# Patient Record
Sex: Female | Born: 1995 | Marital: Single | Smoking: Never smoker
Health system: Southern US, Community
[De-identification: ages and names within clinical notes are randomized; demographics above are authoritative.]

## PROBLEM LIST (undated history)

## (undated) DIAGNOSIS — G809 Cerebral palsy, unspecified: Secondary | ICD-10-CM

## (undated) DIAGNOSIS — F411 Generalized anxiety disorder: Secondary | ICD-10-CM

## (undated) DIAGNOSIS — N921 Excessive and frequent menstruation with irregular cycle: Secondary | ICD-10-CM

## (undated) DIAGNOSIS — G43909 Migraine, unspecified, not intractable, without status migrainosus: Secondary | ICD-10-CM

## (undated) HISTORY — PX: LEG SURGERY: SHX1003

## (undated) HISTORY — DX: Migraine, unspecified, not intractable, without status migrainosus: G43.909

## (undated) HISTORY — DX: Generalized anxiety disorder: F41.1

## (undated) HISTORY — DX: Cerebral palsy, unspecified: G80.9

## (undated) HISTORY — DX: Excessive and frequent menstruation with irregular cycle: N92.1

---
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
## 2013-01-19 ENCOUNTER — Encounter: Payer: Self-pay | Admitting: Pediatrics

## 2013-02-18 ENCOUNTER — Encounter: Payer: Self-pay | Admitting: Pediatrics

## 2013-03-21 ENCOUNTER — Encounter: Payer: Self-pay | Admitting: Pediatrics

## 2013-04-20 ENCOUNTER — Encounter: Payer: Self-pay | Admitting: Pediatrics

## 2013-05-21 ENCOUNTER — Encounter: Payer: Self-pay | Admitting: Pediatrics

## 2013-06-21 ENCOUNTER — Encounter: Payer: Self-pay | Admitting: Pediatrics

## 2013-07-21 ENCOUNTER — Encounter: Payer: Self-pay | Admitting: Pediatrics

## 2014-01-19 ENCOUNTER — Emergency Department: Payer: Self-pay | Admitting: Emergency Medicine

## 2014-01-19 LAB — COMPREHENSIVE METABOLIC PANEL
ALBUMIN: 4.3 g/dL (ref 3.8–5.6)
ALT: 11 U/L — AB (ref 12–78)
ANION GAP: 7 (ref 7–16)
Alkaline Phosphatase: 63 U/L
BUN: 18 mg/dL (ref 7–18)
Bilirubin,Total: 0.4 mg/dL (ref 0.2–1.0)
CALCIUM: 9.3 mg/dL (ref 9.0–10.7)
CHLORIDE: 106 mmol/L (ref 98–107)
CO2: 25 mmol/L (ref 21–32)
Creatinine: 0.66 mg/dL (ref 0.60–1.30)
EGFR (African American): >60
EGFR (Non-African Amer.): >60
GLUCOSE: 81 mg/dL (ref 65–99)
Osmolality: 277 (ref 275–301)
POTASSIUM: 3.7 mmol/L (ref 3.5–5.1)
SGOT(AST): 13 U/L (ref 0–26)
Sodium: 138 mmol/L (ref 136–145)
Total Protein: 7.9 g/dL (ref 6.4–8.6)

## 2014-01-19 LAB — URINALYSIS, COMPLETE
Bacteria: NONE SEEN
Bilirubin,UR: NEGATIVE
GLUCOSE, UR: NEGATIVE mg/dL (ref 0–75)
KETONE: NEGATIVE
Nitrite: NEGATIVE
PROTEIN: NEGATIVE
Ph: 5 (ref 4.5–8.0)
RBC,UR: 2 /HPF (ref 0–5)
Specific Gravity: 1.023 (ref 1.003–1.030)
Squamous Epithelial: 4

## 2014-01-19 LAB — DRUG SCREEN, URINE

## 2014-01-19 LAB — CBC
HCT: 39.5 % (ref 35.0–47.0)
HGB: 12.9 g/dL (ref 12.0–16.0)
MCH: 27.8 pg (ref 26.0–34.0)
MCHC: 32.6 g/dL (ref 32.0–36.0)
MCV: 85 fL (ref 80–100)
Platelet: 211 10*3/uL (ref 150–440)
RBC: 4.62 10*6/uL (ref 3.80–5.20)
RDW: 13.9 % (ref 11.5–14.5)
WBC: 7.1 10*3/uL (ref 3.6–11.0)

## 2014-01-19 LAB — SALICYLATE LEVEL: Salicylates, Serum: <1.7 mg/dL

## 2014-01-19 LAB — ETHANOL
Ethanol %: <0.003 % (ref 0.000–0.080)
Ethanol: <3 mg/dL

## 2014-01-19 LAB — ACETAMINOPHEN LEVEL: Acetaminophen: <2 ug/mL
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
## 2015-02-11 NOTE — Consult Note (Signed)
Brief Consult Note: Diagnosis: adjustment disorder with disturbance of mood and conduct. Mild MR.   Patient was seen by consultant.   Consult note dictated.   Recommend further assessment or treatment.   Discussed with Attending MD.   Comments: Psychiatry: Patient seen. Chart reviewed and note dictated. Patient with evident mild MR has been superficially cutting and acting out. Denies any wish to die. Familly feels safe with her coming back home. No indications for meds to start here. Refer to outpt counceling in the community. Patient agrees to the plan. DC the IVC.  Electronic Signatures: Audery Amellapacs, Ethelmae Ringel T (MD)  (Signed 01-Apr-15 23:16)  Authored: Brief Consult Note   Last Updated: 01-Apr-15 23:16 by Audery Amellapacs, Shanedra Lave T (MD)

## 2015-07-14 ENCOUNTER — Encounter: Payer: Self-pay | Admitting: Family Medicine

## 2015-07-14 ENCOUNTER — Ambulatory Visit (INDEPENDENT_AMBULATORY_CARE_PROVIDER_SITE_OTHER): Payer: Self-pay | Admitting: Family Medicine

## 2015-07-14 ENCOUNTER — Encounter (INDEPENDENT_AMBULATORY_CARE_PROVIDER_SITE_OTHER): Payer: Self-pay

## 2015-07-14 VITALS — BP 112/68 | HR 92 | Temp 97.8°F | Ht 62.4 in | Wt 89.8 lb

## 2015-07-14 DIAGNOSIS — G809 Cerebral palsy, unspecified: Secondary | ICD-10-CM

## 2015-07-14 DIAGNOSIS — R634 Abnormal weight loss: Secondary | ICD-10-CM

## 2015-07-14 DIAGNOSIS — F32A Depression, unspecified: Secondary | ICD-10-CM

## 2015-07-14 DIAGNOSIS — F329 Major depressive disorder, single episode, unspecified: Secondary | ICD-10-CM

## 2015-07-14 NOTE — Patient Instructions (Signed)
Nice to meet you. Please eat calorie dense foods. We will refer you to a psychologist and someone who specializes in cerebral palsy.  If you develops fever, thoughts of hurting her self or others, chest pain, shortness of breath, cough, or chills please seek medical attention.  Please consider obtaining blood work.

## 2015-07-14 NOTE — Progress Notes (Signed)
Patient ID: Noorah Giammona, female   DOB: [birth year], 19 y.o.   MRN: [id number]  Marikay Alar, MD Phone: [phone]  Burnett Lieber is a 19 y.o. female who presents today for new patient visit.  Patient was seen with the assistance of an urdu phone interpretor. History obtained from patient, patients mother, and patients brother.  Weight loss: reports weight loss of 20 lbs over the past 2 years. Notes she does not have much of an appetite for normal meals, though will eat lots of snacks. They note she feels depressed and will not eat much if she feels stressed. Has issues with depression intermittently. Denies SI and HI. Has not previously been on medications for this. Has never seen a therapist. Notes they moved to the [place] from [place] 5 years ago. No known TB exposure. Notes she intermittently feels hot and sweats, though no fevers. Notes some intermittent muscle spasms. No abdominal pain, vomiting, or diarrhea. No blood in stool. No cough. Is not sexually active. Had blood work in [place] prior to leaving. Has not seen a doctor in >2 years.   Has cerebral palsy. Diagnosed at age 84 in [place]. Has physical and mental disabilities from this. Is unable to read or write in english due to this. Graduated from high school. Notes she has not seen a CP specialist. Notes some contractures in LE with this. Difficulty walking on own due to contractures in legs.   Active Ambulatory Problems    Diagnosis Date Noted  . Loss of weight 07/17/2015  . Depression 07/17/2015  . Cerebral palsy 07/17/2015   Resolved Ambulatory Problems    Diagnosis Date Noted  . No Resolved Ambulatory Problems   No Additional Past Medical History    Family History  Problem Relation Age of Onset  . Stroke      grandparent  . Hypertension      parent  . Mental illness      parent  . Diabetes      parent, grandparent    Social History   Social History  . Marital Status: Single    Spouse Name: N/A  . Number  of Children: N/A  . Years of Education: N/A   Occupational History  . Not on file.   Social History Main Topics  . Smoking status: Never Smoker   . Smokeless tobacco: Not on file  . Alcohol Use: No  . Drug Use: No  . Sexual Activity: Not on file   Other Topics Concern  . Not on file   Social History Narrative    ROS   General:  Positive for unexplained weight loss, negative for fever Skin: Negative for new or changing mole, sore that won't heal HEENT: Negative for trouble hearing, trouble seeing, ringing in ears, mouth sores, hoarseness, change in voice, dysphagia. CV:  Negative for chest pain, dyspnea, edema, palpitations Resp: Negative for cough, dyspnea, hemoptysis GI: Negative for nausea, vomiting, diarrhea, constipation, abdominal pain, melena, hematochezia. GU: Negative for dysuria, incontinence, urinary hesitance, hematuria, vaginal or penile discharge, polyuria, sexual difficulty, lumps in testicle or breasts MSK: Negative for muscle cramps or aches, joint pain or swelling Neuro: Negative for headaches, weakness, numbness, dizziness, passing out/fainting Psych: Positive for stress, Negative for depression, anxiety, memory problems  Objective  Physical Exam Filed Vitals:   07/14/15 1255  BP: 112/68  Pulse: 92  Temp: 97.8 F (36.6 C)    Physical Exam  Constitutional:  Thin female  HENT:  Head: Normocephalic and atraumatic.  Right Ear:  External ear normal.  Left Ear: External ear normal.  Mouth/Throat: Oropharynx is clear and moist. No oropharyngeal exudate.  Eyes: Conjunctivae are normal. Pupils are equal, round, and reactive to light.  Neck: Neck supple. No thyromegaly present.  Cardiovascular: Normal rate, regular rhythm and normal heart sounds.  Exam reveals no gallop and no friction rub.   No murmur heard. Pulmonary/Chest: Effort normal and breath sounds normal. No respiratory distress. She has no wheezes. She has no rales.  Abdominal: Soft. Bowel  sounds are normal. She exhibits no distension. There is no tenderness. There is no rebound and no guarding.  Musculoskeletal: She exhibits no edema.  Hamstring contractures present  Lymphadenopathy:    She has no cervical adenopathy.  Neurological: She is alert.  Gait with limp related to contractures  Skin: Skin is warm and dry.  No supraclavicular, axillary, or inguinal adenopathy      Assessment/Plan:   Loss of weight Patient with apparent unexplained loss of weight over the past 2 years. Suspect this is due to depression given patient report of depression, though could also be related to her cerebral palsy. Does have intermittent sweats that could represent a number of things. No other apparent causes on physical exam. Discussed at length that further lab work would be beneficial to evaluate for other cause, though patient and family members declined this at this time, stating they would consider it in the future. Discussed high calorie intake. Will refer to psychology for eval of depression. Will continue to monitor patients weight. Given return precautions.   Depression Per patient report has intermittent stress and depression. No SI or HI. Not previously treated for this. Will refer to psychology for further evaluation of this issue. Given return precautions.   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 Will refer to [facility] PM[facility] for further evaluation and treatment of spasticity.     Orders Placed This Encounter  Procedures  . Ambulatory referral to Physical Medicine Rehab    Referral Priority:  Routine    Referral Type:  Rehabilitation    Referral Reason:  Specialty Services Required    Requested Specialty:  Physical Medicine and Rehabilitation    Number of Visits Requested:  1  . Ambulatory referral to Psychology    Referral Priority:  Routine    Referral Type:  Psychiatric    Referral Reason:  Specialty Services Required    Requested Specialty:  Psychology     Number of Visits Requested:  1    Marikay Alar

## 2015-07-17 ENCOUNTER — Encounter: Payer: Self-pay | Admitting: Family Medicine

## 2015-07-17 DIAGNOSIS — F32A Depression, unspecified: Secondary | ICD-10-CM | POA: Insufficient documentation

## 2015-07-17 DIAGNOSIS — G809 Cerebral palsy, unspecified: Secondary | ICD-10-CM | POA: Insufficient documentation

## 2015-07-17 DIAGNOSIS — R634 Abnormal weight loss: Secondary | ICD-10-CM | POA: Insufficient documentation

## 2015-07-17 DIAGNOSIS — F329 Major depressive disorder, single episode, unspecified: Secondary | ICD-10-CM | POA: Insufficient documentation

## 2015-07-17 NOTE — Assessment & Plan Note (Signed)
Patient with spasticity noted in LE. Will refer to [facility] PM&R for further evaluation and treatment of spasticity.

## 2015-07-17 NOTE — Assessment & Plan Note (Signed)
Per patient report has intermittent stress and depression. No SI or HI. Not previously treated for this. Will refer to psychology for further evaluation of this issue. Given return precautions.

## 2015-07-17 NOTE — Assessment & Plan Note (Addendum)
Patient with apparent unexplained loss of weight over the past 2 years. Suspect this is due to depression given patient report of depression, though could also be related to her cerebral palsy. Does have intermittent sweats that could represent a number of things. No other apparent causes on physical exam. Discussed at length that further lab work would be beneficial to evaluate for other cause, though patient and family members declined this at this time, stating they would consider it in the future. Discussed high calorie intake. Will refer to psychology for eval of depression. Will continue to monitor patients weight. Given return precautions.

## 2015-07-18 ENCOUNTER — Telehealth: Payer: Self-pay | Admitting: Family Medicine

## 2015-07-18 NOTE — Telephone Encounter (Signed)
Pt brother dropped off pt medical records from previous doctor in [place]. Medical records is in Dr Birdie Sons box. Thank You!

## 2015-07-18 NOTE — Telephone Encounter (Signed)
Called brother and LM that I have copied patients medical records and they are up front for him to pick up

## 2015-07-21 ENCOUNTER — Telehealth: Payer: Self-pay

## 2015-07-21 NOTE — Telephone Encounter (Signed)
Patient called wanting a follow up on paperwork that they dropped off? Would like a call back.

## 2016-10-02 ENCOUNTER — Ambulatory Visit: Payer: Medicaid Other | Attending: Internal Medicine | Admitting: Physical Therapy

## 2016-10-02 ENCOUNTER — Encounter: Payer: Self-pay | Admitting: Physical Therapy

## 2016-10-02 DIAGNOSIS — M6281 Muscle weakness (generalized): Secondary | ICD-10-CM | POA: Diagnosis not present

## 2016-10-02 DIAGNOSIS — R2681 Unsteadiness on feet: Secondary | ICD-10-CM | POA: Diagnosis present

## 2016-10-02 NOTE — Therapy (Signed)
[facility] [address] [place], [place], [address] Phone: [phone]   Fax:  [phone]  Physical Therapy Evaluation  Patient Details  Name: Renee BaldFatima Walton MRN: [id number] Date of Birth: 10-Jan-1996 Referring Provider: Felecia JanNeelham Khan MD  Encounter Date: 10/02/2016      PT End of Session - 10/02/16 1253    Visit Number 1   Number of Visits 17   Date for PT Re-Evaluation 11/27/16   Authorization Type medicaid   PT Start Time 1125   PT Stop Time 1200   PT Time Calculation (min) 35 min   Activity Tolerance Patient tolerated treatment well;No increased pain   Behavior During Therapy WFL for tasks assessed/performed      Past Medical History:  Diagnosis Date  . Cerebral palsy [facility](HCC)     Past Surgical History:  Procedure Laterality Date  . LEG SURGERY     bilateral x2 in 2008    There were no vitals filed for this visit.       Subjective Assessment - 10/02/16 1122    Subjective 20 yo Female diagnosed with cerebral palsy reports increased difficulty with functional mobility; Patient reports stopping PT about 4 years ago; She reports increased tightness in BLE especiallly in hips; She reports not being able to get up after sitting on the floor; She has trouble getting up from low chair; She currently uses a reverse kaye walker for long distance walking; She reports using HHA while at home or outside. She reports multiple falls; reports occasional numbness/tingling in LLE; She reports increased hip adductor tone especially with walking and talking;    Patient is accompained by: Family member   Pertinent History pertinent factors affecting rehab: cerebral palsy, high fall risk; good family support;    Limitations Standing;Walking   How long can you stand comfortably? 5-15 min   How long can you walk comfortably? 30 min;    Diagnostic tests none recent;    Patient Stated Goals be more independent, walk without person  assist,    Currently in Pain? No/denies            [facility] - 10/02/16 0001      Assessment   Medical Diagnosis Cerebral Palsy   Referring Provider Felecia JanNeelham Khan MD   Hand Dominance Left   Next MD Visit Feb 2018   Prior Therapy had PT about 4 years ago, however had to stop because of loss of insurance;      Precautions   Precautions Fall   Required Braces or Orthoses --  none- did wear AFOs but they are too small now     Restrictions   Weight Bearing Restrictions No     Balance Screen   Has the patient fallen in the past 6 months Yes   How many times? multiple  2+ per day   Has the patient had a decrease in activity level because of a fear of falling?  Yes   Is the patient reluctant to leave their home because of a fear of falling?  No     Home Environment   Additional Comments Lives with family; level entry home, lives in 2 story home with bedroom/bathroom on 2nd floor; has 10 steps to get to 2nd floor, left rail, negotiates one step at a time; needs help with meal prep, mom helps with bathing/dressing; she helps with getting on/off shoes,      Prior Function   Level of Independence Requires assistive  device for independence;Needs assistance with ADLs;Needs assistance with gait   Vocation Student  [employer]   Vocation Requirements uses walker at school; able to get in/out of desk okay   Leisure play on ipad;      Cognition   Overall Cognitive Status Within Functional Limits for tasks assessed     Observation/Other Assessments   Observations very pleasant young woman, demonstrates increased tone with talking and with mobility;     Sensation   Light Touch Appears Intact     Coordination   Heel Shin Test impaired BLE     Posture/Postural Control   Posture Comments able to sit with erect posture, demonstrates increased knee extension with trunk movement due to extensor tone;      AROM   Overall AROM Comments BUE is WFL; BLE PROM is WFL, AROM is  limited due to tone and weakness;      Strength   Overall Strength Comments BUE grossly 4/5   Right Hip Flexion 3-/5   Right Hip Extension 3/5   Right Hip ABduction 2-/5   Right Hip ADduction 2+/5   Left Hip Flexion 3/5   Left Hip Extension 3/5   Left Hip ABduction 2-/5   Left Hip ADduction 2+/5   Right Knee Flexion 4-/5   Right Knee Extension 3-/5   Left Knee Flexion 4-/5   Left Knee Extension 2+/5   Right Ankle Dorsiflexion 2-/5   Left Ankle Dorsiflexion 2/5     Palpation   Palpation comment denies any tenderness to palpation;      FABER test   findings Positive   Comment positive bilaterally;     Straight Leg Raise   Comment negative bilaterally;     [facility](FABER) Test   Comments positive bilaterally;     Hip Scouring   Comments negative bilaterally;     Bed Mobility   Bed Mobility --  patient requires min A for bed mobility;      Transfers   Comments able to transfer independently requiring arm rests to push up on;      Ambulation/Gait   Gait Comments ambulates with RW with decreased step length, increased RLE toe out, flat foot at initial contact with foot drag, lateral trunk sway, poor hip and knee mobility/control, narrow base of support;      6 minute walk test results    Endurance additional comments fair     Standardized Balance Assessment   10 Meter Walk 0.344 m/s with RW (high fall risk, limited home ambulator)     High Level Balance   High Level Balance Comments able to stand without AD with feet apart, needs assistance to avoid loss of balance when taking a step;        Patient reports increased sacral pain with standing and walking; She did test positive with FABERs. However with other hip movement, patient reports mostly stretch and not pain; She demonstrates increased tone: R: 3+, L: 3+ on modified ashworth;                    PT Education - 10/02/16 1253    Education provided Yes   Education Details recommendations    Person(s) Educated Patient   Methods Explanation   Comprehension Verbalized understanding             PT Long Term Goals - 10/02/16 1300      PT LONG TERM GOAL #1   Title Patient will be independent in home exercise  program to improve strength/mobility for better functional independence with ADLs.   Baseline does not have a HEP   Time 8   Period Weeks   Status New     PT LONG TERM GOAL #2   Title Patient (< 41 years old) will complete five times sit to stand test in < 10 seconds indicating an increased LE strength and improved balance.   Baseline unable to assess; does require BUE to push up on arm rests;    Time 8   Period Weeks   Status New     PT LONG TERM GOAL #3   Title Patient will increase 10 meter walk test to >0.8 m/s as to improve gait speed for better community ambulation and to reduce fall risk.   Baseline 0.34 m/s   Time 8   Period Weeks   Status New     PT LONG TERM GOAL #4   Title Patient will increase lower extremity functional scale to >40/80 to demonstrate improved functional mobility and increased tolerance with ADLs.    Baseline unable to assess at this time;    Time 8   Period Weeks   Status New               Plan - 10/02/16 1253    Clinical Impression Statement 20 yo Female diagnosed with cerebral palsy, presents to therapy with increased weakness and decreased mobility. Patient reports walking with AD and requiring assistance from family for most ADLs. She reports out growing her AFOs and therefore has not been wearing any braces on her LE. Patient demonstrates increased BLE extensor tone. She reports that she has not been stretching regularly. She reports multiple falls each day. She reports that she has not been following up with MD or physical therapy due to loss of insurance coverage. Patient would benefit from skilled PT intervention to improve LE strength, balance and gait safety; She would also benefit from evaluation for possible  orthotics to improve gait safety;    Rehab Potential Fair   Clinical Impairments Affecting Rehab Potential positive: Family support, negative: impaired balance, weakness, progress condition; Patient's clinical presentation is evolving due to fall risk;    PT Frequency 2x / week   PT Duration 8 weeks   PT Treatment/Interventions ADLs/Self Care Home Management;Cryotherapy;Electrical Stimulation;Gait training;Moist Heat;Stair training;Functional mobility training;Therapeutic activities;Therapeutic exercise;Balance training;Neuromuscular re-education;Manual techniques;Patient/family education;Passive range of motion;Dry needling;Energy conservation   PT Next Visit Plan initiate HEP   PT Home Exercise Plan will address next visit;    Consulted and Agree with Plan of Care Patient      Patient will benefit from skilled therapeutic intervention in order to improve the following deficits and impairments:  Abnormal gait, Pain, Postural dysfunction, Decreased mobility, Decreased activity tolerance, Decreased endurance, Decreased range of motion, Decreased strength, Impaired flexibility, Difficulty walking, Decreased safety awareness, Decreased balance  Visit Diagnosis: Muscle weakness (generalized) - Plan: PT plan of care cert/re-cert  Unsteadiness on feet - Plan: PT plan of care cert/re-cert     Problem List Patient Active Problem List   Diagnosis Date Noted  . Loss of weight 07/17/2015  . Depression 07/17/2015  . Cerebral palsy (HCC) 07/17/2015    Trotter,Margaret PT, DPT 10/02/2016, 1:03 PM  [facility] [facility] MAIN [facility] SERVICES [address] [place], [place], [address] Phone: [phone]   Fax:  [phone]  Name: Renee Walton MRN: [id number] Date of Birth: April 17, 1996

## 2016-10-11 ENCOUNTER — Encounter: Payer: Medicaid Other | Admitting: Occupational Therapy

## 2016-10-11 ENCOUNTER — Ambulatory Visit: Payer: Medicaid Other | Admitting: Speech Pathology

## 2016-10-24 ENCOUNTER — Ambulatory Visit: Payer: Medicaid Other | Attending: Internal Medicine | Admitting: Speech Pathology

## 2016-10-24 DIAGNOSIS — R41841 Cognitive communication deficit: Secondary | ICD-10-CM | POA: Insufficient documentation

## 2016-10-24 DIAGNOSIS — R471 Dysarthria and anarthria: Secondary | ICD-10-CM | POA: Diagnosis not present

## 2016-10-24 DIAGNOSIS — M6281 Muscle weakness (generalized): Secondary | ICD-10-CM | POA: Diagnosis present

## 2016-10-24 DIAGNOSIS — R2681 Unsteadiness on feet: Secondary | ICD-10-CM | POA: Diagnosis present

## 2016-10-25 ENCOUNTER — Encounter: Payer: Self-pay | Admitting: Speech Pathology

## 2016-10-25 NOTE — Therapy (Signed)
[facility] [address] [place], [place], [address] Phone: [phone]   Fax:  [phone]  Speech Language Pathology Evaluation  Patient Details  Name: Renee BaldFatima Walton MRN: [id number] Date of Birth: 12-17-1995 Referring Provider: Margaretann LovelessKHAN, NEELAM S  Encounter Date: 10/24/2016      End of Session - 10/25/16 1030    Visit Number 1   Number of Visits 5   Date for SLP Re-Evaluation 11/29/16   SLP Start Time 1400   SLP Stop Time  1500   SLP Time Calculation (min) 60 min   Activity Tolerance Patient tolerated treatment well      Past Medical History:  Diagnosis Date  . Cerebral palsy [facility](HCC)     Past Surgical History:  Procedure Laterality Date  . LEG SURGERY     bilateral x2 in 2008    There were no vitals filed for this visit.          SLP Evaluation [facility] - 10/25/16 0001      SLP Visit Information   SLP Received On 10/24/16   Referring Provider Yves DillKHAN, NEELAM S   Onset Date 08/31/2016     Subjective   Subjective The patient reports multiple communication problems including: she talks too fast, she is too loud ("I don't know what my inside voice sounds like"), difficulty reading and pronouncing words she reads.  She reports multiple pragmatic issues including, poor eye contact, delayed expression of social details, poor response to feedback, and inattention to others.   Patient/Family Stated Goal The patient has unrealistic expectations of speech therapy, her family do not state goals for the patient.     Pain Assessment   Currently in Pain? No/denies     General Information   HPI The patient is a 21 year old with cerebral palsy.  She has had extensive speech therapy in the pediatric setting.  Patient reports that she is currently enrolled in a life living school at [employer].     Prior Functional Status   Cognitive/Linguistic Baseline Baseline deficits   Baseline deficit details Mental  disabilities due to cerebral palsy     Cognition   Overall Cognitive Status History of cognitive impairments - at baseline     Auditory Comprehension   Overall Auditory Comprehension Appears within functional limits for tasks assessed     Reading Comprehension   Reading Status Impaired  Impairments due to intellectual ability     Expression   Primary Mode of Expression Verbal     Verbal Expression   Overall Verbal Expression Appears within functional limits for tasks assessed   Pragmatics Impairment     Written Expression   Written Expression Exceptions to [facility]   Overall Writen Expression Impairment due to intellectual disability     Oral Motor/Sensory Function   Overall Oral Motor/Sensory Function Impaired at baseline   Overall Oral Motor/Sensory Function Cerebral palsy     Motor Speech   Overall Motor Speech Impaired at baseline  Cerebral palsy            SLP Education - 10/25/16 1029    Education provided Yes   Education Details Recommendations   Person(s) Educated Patient;Parent(s);Other (comment)   Methods Explanation   Comprehension Verbalized understanding            SLP Long Term Goals - 10/25/16 1034      SLP LONG TERM GOAL #1   Title Patient will verbalize speech intelligiblity strategies and generate approaches  to generalize into unstructured settings   Baseline Patient can state speech intelligiblity strategies but does not indepedently generalize across settings.   Time 4   Period Weeks   Status New     SLP LONG TERM GOAL #2   Title Patient will develop, in conjuction with SLP, system to structure her daily life.    Baseline Patient does not independently engae in stimulating activities outside of structured settings   Time 4   Period Weeks   Status New          Plan - 10/25/16 1032    Clinical Impression Statement The patient is presenting with mild-moderate dysarthria typical of cerebral palsy.  She has had extensive speech therapy  in the pediatric setting and is able to use appropriate strategies to improve her speech intelligibility in a structured setting such as this evaluation session.  The patient is able to state and utilize speech strategies learned in speech therapy but is having difficulty generalizing to other settings.  The patient will benefit from skilled speech therapy to develop, in conjunction with the patient, methods to generalize speech intelligibility strategies into less structured settings.  The patient is reporting a reduction in her independent completion of activities when not in structured settings.  She will benefit from skilled speech therapy to develop, in conjunction with the patient, a system to structure her daily life to encourage engagement in stimulating activities.  In addition, the patient reports that her speech symptoms worsen when she is anxious.  She is not able to tell me behavioral strategies that help her to calm independently.  The patient would benefit from skilled counseling to work with her to develop practical self-calming strategies.     Speech Therapy Frequency 1x /week   Duration 4 weeks   Treatment/Interventions Compensatory strategies;SLP instruction and feedback;Environmental controls;Internal/external aids;Patient/family education   Potential to Achieve Goals Good   Potential Considerations Ability to learn/carryover information;Co-morbidities;Cooperation/participation level;Medical prognosis;Previous level of function;Severity of impairments;Family/community support   SLP Home Exercise Plan To be determined   Consulted and Agree with Plan of Care Patient;Family member/caregiver   Family Member Consulted Mother and brother      Patient will benefit from skilled therapeutic intervention in order to improve the following deficits and impairments:   Dysarthria and anarthria - Plan: SLP plan of care cert/re-cert  Cognitive communication deficit - Plan: SLP plan of care  cert/re-cert    Problem List Patient Active Problem List   Diagnosis Date Noted  . Loss of weight 07/17/2015  . Depression 07/17/2015  . Cerebral palsy (HCC) 07/17/2015   Dollene Primrose, MS/CCC- SLP  Leandrew Koyanagi 10/25/2016, 10:43 AM  [facility] MAIN [facility] SERVICES [address] [place], [place], [address] Phone: [phone]   Fax:  [phone]  Name: Renee Walton MRN: [id number] Date of Birth: May 28, 1996

## 2016-11-20 ENCOUNTER — Encounter: Payer: Self-pay | Admitting: Physical Therapy

## 2016-11-20 ENCOUNTER — Ambulatory Visit: Payer: Medicaid Other | Admitting: Physical Therapy

## 2016-11-20 DIAGNOSIS — R2681 Unsteadiness on feet: Secondary | ICD-10-CM

## 2016-11-20 DIAGNOSIS — R471 Dysarthria and anarthria: Secondary | ICD-10-CM | POA: Diagnosis not present

## 2016-11-20 DIAGNOSIS — M6281 Muscle weakness (generalized): Secondary | ICD-10-CM

## 2016-11-20 NOTE — Therapy (Signed)
[facility] [address] [place], [place], [address] Phone: [phone]   Fax:  [phone]  Physical Therapy Treatment  Patient Details  Name: Renee Walton MRN: [id number] Date of Birth: Jun 23, 1996 Referring Provider: Felecia JanNeelham Khan MD  Encounter Date: 11/20/2016      PT End of Session - 11/20/16 0855    Visit Number 2   Number of Visits 17   Date for PT Re-Evaluation 11/27/16   Authorization Type medicaid (3 visits approved 1/2-2/26/18); visit1   PT Start Time 0850   PT Stop Time 0930   PT Time Calculation (min) 40 min   Equipment Utilized During Treatment Gait belt   Activity Tolerance Patient tolerated treatment well;No increased pain   Behavior During Therapy WFL for tasks assessed/performed      Past Medical History:  Diagnosis Date  . Cerebral palsy [facility](HCC)     Past Surgical History:  Procedure Laterality Date  . LEG SURGERY     bilateral x2 in 2008    There were no vitals filed for this visit.      Subjective Assessment - 11/20/16 0854    Subjective Patient reports doing okay; She presents to therapy without walker. She reports, "I forgot my walker." Patient also presents without AFOs. She reports having 3 falls in last few weeks;    Patient is accompained by: Family member   Pertinent History pertinent factors affecting rehab: cerebral palsy, high fall risk; good family support;    Limitations Standing;Walking   How long can you stand comfortably? 5-15 min   How long can you walk comfortably? 30 min;    Diagnostic tests none recent;    Patient Stated Goals be more independent, walk without person assist,    Currently in Pain? No/denies          TREATMENT:  Warm up on Nustep BUE/BLE level 2 x10 min with HEP instruction;  Initiated HEP: Standing: Hip abduction x10 without yellow band with cues to avoid hip/trunk lean for better strengthening; Attempted hip extension but patient unable to  do at this time; Side stepping yellow tband x10 feet x2 laps each direction with cues to avoid trunk lean and use bar for better balance;  Forward/backward walking, unsupported with CGA- min A for balance and cues to increase step length with backwards walking for better balance;  Forward/backward step x5 reps with cues to increase step length for increased balance challenge; Side step over line x5 with rail assist with cues for less trunk lean and to increase step length;  Educated patient in LE stretches: Seated hamstring stretch 15 sec hold x1 bilaterally Hip adductor stretch 15 sec hold x1 bilaterally;  Supine: KNee to chest stretch 15 sec hold x1 bilaterally; Lumbar trunk rotation x10 bilaterally; Prone: Press up on elbows x5 reps; Quad stretch 15 sec hold x2 bilaterally;  Patient required min-moderate verbal/tactile cues for correct exercise technique including cues for correct position and hold time for better stretch and tissue flexibility;  Standing heel off step stretch with cues to keep knee straight for better calf stretch 10 sec hold x1;  Patient instructed to bring walker to next session to assess safety and correct position of AD to reduce fall risk;                        PT Education - 11/20/16 0855    Education provided Yes   Education Details HEP initiated, strengthening/balance exercise;  Person(s) Educated Patient   Methods Explanation;Verbal cues;Handout   Comprehension Verbalized understanding;Returned demonstration;Verbal cues required             PT Long Term Goals - 10/02/16 1300      PT LONG TERM GOAL #1   Title Patient will be independent in home exercise program to improve strength/mobility for better functional independence with ADLs.   Baseline does not have a HEP   Time 8   Period Weeks   Status New     PT LONG TERM GOAL #2   Title Patient (< 42 years old) will complete five times sit to stand test in < 10 seconds  indicating an increased LE strength and improved balance.   Baseline unable to assess; does require BUE to push up on arm rests;    Time 8   Period Weeks   Status New     PT LONG TERM GOAL #3   Title Patient will increase 10 meter walk test to >0.8 m/s as to improve gait speed for better community ambulation and to reduce fall risk.   Baseline 0.34 m/s   Time 8   Period Weeks   Status New     PT LONG TERM GOAL #4   Title Patient will increase lower extremity functional scale to >40/80 to demonstrate improved functional mobility and increased tolerance with ADLs.    Baseline unable to assess at this time;    Time 8   Period Weeks   Status New               Plan - 11/20/16 1610    Clinical Impression Statement Patient instructed in LE stretches/strengthening exercise as part of HEP; She required mod Vcs for positioning and posture to improve strengthening; Patient also required mod VCs to improve exercise technique to improve muscle contraction. Patient fatigues quickly and demonstrates increased lateral trunk lean for compensation with hip weakness. Also educated patient in balance exercise, She required CGA to min A for advanced balance exercise. Patient would benefit from additional skilled PT intervention to improve strength, balance and tissue flexibility for improved independence in ADLs;    Rehab Potential Fair   Clinical Impairments Affecting Rehab Potential positive: Family support, negative: impaired balance, weakness, progress condition; Patient's clinical presentation is evolving due to fall risk;    PT Frequency 2x / week   PT Duration 8 weeks   PT Treatment/Interventions ADLs/Self Care Home Management;Cryotherapy;Electrical Stimulation;Gait training;Moist Heat;Stair training;Functional mobility training;Therapeutic activities;Therapeutic exercise;Balance training;Neuromuscular re-education;Manual techniques;Patient/family education;Passive range of motion;Dry  needling;Energy conservation   PT Next Visit Plan initiate HEP   PT Home Exercise Plan will address next visit;    Consulted and Agree with Plan of Care Patient      Patient will benefit from skilled therapeutic intervention in order to improve the following deficits and impairments:  Abnormal gait, Pain, Postural dysfunction, Decreased mobility, Decreased activity tolerance, Decreased endurance, Decreased range of motion, Decreased strength, Impaired flexibility, Difficulty walking, Decreased safety awareness, Decreased balance  Visit Diagnosis: Muscle weakness (generalized)  Unsteadiness on feet     Problem List Patient Active Problem List   Diagnosis Date Noted  . Loss of weight 07/17/2015  . Depression 07/17/2015  . Cerebral palsy (HCC) 07/17/2015    Trotter,Margaret PT, DPT 11/20/2016, 9:26 AM  [place] [facility] MAIN [facility] SERVICES [address] [place], [place], [address] Phone: [phone]   Fax:  [phone]  Name: Renee Walton MRN: [id number] Date of Birth: 20-May-1996

## 2016-11-20 NOTE — Patient Instructions (Addendum)
Balance, Proprioception: Hip Abduction With Tubing   With tubing attached to both ankles, Standing holding onto counter, kick one leg out to side and then Return.  Repeat _10___ times  On each side.  Do ___2_ sessions per day.  http://cc.exer.us/20     Copyright  VHI. All rights reserved.  Balance, Proprioception: Hip Extension With Tubing   With tubing tied around both legs, holding onto kitchen counter, swing leg back. Return. Repeat _10___ times . Do __2__ sessions per day.  http://cc.exer.us/19     Copyright  VHI. All rights reserved.  Band Walk: Side Stepping   Tie band around legs,Around ankles. Step _10__ feet to one side, then step back to start. Repeat _2-3__ feet per session. Note: Small towel between band and skin eases rubbing.  http://plyo.exer.us/76    Toe / Heel Raise (Standing)    Standing with support, raise heels, then rock back on heels and raise toes. Repeat __10__ times.  Copyright  VHI. All rights reserved.   SIT TO STAND: No Device   Sit with feet shoulder-width apart, on floor.(Make sure that you are in a chair that won't move like a chair against a wall or couch etc) Lean chest forward, raise hips up from surface. Straighten hips and knees. Weight bear equally on left and right sides. 10___ reps per set, _2__ sets per day, _5__ days per week Place left leg closer to sitting surface.  Copyright  VHI. All rights reserved.  Backward Walking   Walk backward, toes of each foot coming down first. Take long, even strides. Make sure you have a clear pathway with no obstructions when you do this. Stand beside counter and walk backward  And then walk forward doing opposite directions; repeat 10 laps 2x a day at least 5 days a week.   Four Square Stepping   Stand in right front square of four square pattern. Move clockwise, stepping from one square to next. Keep facing forward. Repeat __10__ times per session. Do _1___ sessions per day.     Copyright  VHI. All rights reserved. Knee to Chest (Flexion)   Pull knee toward chest. Feel stretch in lower back or buttock area. Breathing deeply, Hold __15__ seconds. Repeat with other knee. Repeat _2-3___ times. Do _2-3___ sessions per day.  http://[email or web address].exer.us/225   Copyright  VHI. All rights reserved.   Lower Trunk Rotation Stretch  Lying on back with knees bent, Keeping back flat and feet together, rotate knees side to side slowly and in pain free range of motion.  Hold _2___ seconds. Repeat for 1-2 minutes. Do __1__ sets per session. Do __2-3__ sessions per day.  http://orth.exer.us/122   Hamstring Stretch    Reach down along right leg until a comfortable stretch is felt in back of thigh. Be sure to keep knee straight. Hold __15__ seconds. Repeat __2__ times per set. Do _2___ sets per session. Do __2__ sessions per day.  http://orth.exer.us/157   Copyright  VHI. All rights reserved.  PELVIC PRESSURE: Bound Angle    Sit with soles of feet together. Hold feet or ankles. Press knees toward floor and stretch up through spine. Lean forward and Hold position for _10__sec.  Repeat _2-3__ times. Do __2_ times per day.  Copyright  VHI. All rights reserved.    Copyright  VHI. All rights reserved.  Back Hyperextension: Using Arms    Lying face down with arms bent, inhale. Then while exhaling, straighten arms. Hold ___5_ seconds. Slowly return to starting position. Repeat __10__ times per  set. Do __1__ sets per session. Do _2___ sessions per day.  Copyright  VHI. All rights reserved.  KNEE: Quadriceps - Prone    Place strap around ankle. Bring ankle toward buttocks. Press hip into surface. Hold _15__ seconds. _2__ reps per set, __2_ sets per day, __5_ days per week    Copyright  VHI. All rights reserved.  ANKLE: Dorsiflexion, Step Unilateral   Stand on step, hang one heel off back of step. Hold _20__ seconds. __3_ reps per set, __2_ sets per day, _5_  days per week Hold onto a support.  Copyright  VHI. All rights reserved.

## 2016-12-04 ENCOUNTER — Ambulatory Visit: Payer: Medicaid Other | Attending: Internal Medicine | Admitting: Physical Therapy

## 2016-12-04 ENCOUNTER — Encounter: Payer: Self-pay | Admitting: Physical Therapy

## 2016-12-04 DIAGNOSIS — R2681 Unsteadiness on feet: Secondary | ICD-10-CM

## 2016-12-04 DIAGNOSIS — M6281 Muscle weakness (generalized): Secondary | ICD-10-CM

## 2016-12-04 NOTE — Therapy (Signed)
[facility] [address] [place], [place], [address] Phone: [phone]   Fax:  [phone]  Physical Therapy Treatment  Patient Details  Name: Renee Walton MRN: [id number] Date of Birth: 1996-10-20 Referring Provider: Felecia JanNeelham Khan MD  Encounter Date: 12/04/2016      PT End of Session - 12/04/16 1654    Visit Number 3   Number of Visits 17   Date for PT Re-Evaluation 01/01/17   Authorization Type medicaid (3 visits approved 1/2-2/26/18); visit1   PT Start Time 1645   PT Stop Time 1730   PT Time Calculation (min) 45 min   Equipment Utilized During Treatment Gait belt   Activity Tolerance Patient tolerated treatment well;No increased pain   Behavior During Therapy WFL for tasks assessed/performed      Past Medical History:  Diagnosis Date  . Cerebral palsy [facility](HCC)     Past Surgical History:  Procedure Laterality Date  . LEG SURGERY     bilateral x2 in 2008    There were no vitals filed for this visit.      Subjective Assessment - 12/04/16 1652    Subjective Patient reports compliance with HEP; She reports that her mom helps her for about 1 hour per day. Patient reports still having falls and is not walking with assistive device at home. She reports slight soreness in front of legs due to weakness;    Patient is accompained by: Family member   Pertinent History pertinent factors affecting rehab: cerebral palsy, high fall risk; good family support;    Limitations Standing;Walking   How long can you stand comfortably? 5-15 min   How long can you walk comfortably? 30 min;    Diagnostic tests none recent;    Patient Stated Goals be more independent, walk without person assist,    Currently in Pain? Yes   Pain Score 5    Pain Location Leg   Pain Orientation Right;Anterior   Pain Descriptors / Indicators Aching;Sore   Pain Type Acute pain       TREATMENT:  Warm up on Nustep BUE/BLE level 2 x4 min  (unbilled);  PT educated patient in gait safety: Gait with RW x100 feet with weaving through cones #6 with min A for safety and cues to improve turn for safety when negotiating cones. Patient has difficulty with walker control demonstrating increased unsteadiness laterally;  PT instructed patient in gait with Lofstrand crutches in BUE x100 feet with weaving through 2-3 cones with cues for crutch placement, weight shift and gait sequencing. She had difficulty with crutch placement demonstrating increased weakness in BUE with decreased weight bearing. Patient also required mod- max A during gait for safety with decreased trunk control;  Educated patient/caregivers on pro/con of each assistive device. Patient would benefit from additional gait training with assistive devices for better independence and safety;  Standing in parallel bars: Grade II-III posterior glide to talocrual joint with contral lateral LE step through x10 with B rail assist for safety; Patient had difficulty focusing on task as she was often distracted with family; In addition, patient demonstrates increased heel raise having difficulty with calf stretch during posterior glide.  She reports less stiffness following stretch/exercise and was able to demonstrate better step length; Patient continues to be fatigued in UE demonstrating decreased gait safety at end of session; Recommend patient continue with HEP as given and to work on walking more at home to build up endurance;  PT Education - 12/04/16 1653    Education provided Yes   Education Details HEP reinforced, stretches/strengthening; gait safety with walker   Person(s) Educated Patient;Parent(s)   Methods Explanation;Demonstration;Verbal cues   Comprehension Verbalized understanding;Returned demonstration;Verbal cues required;Need further instruction             PT Long Term Goals - 12/04/16 1742      PT LONG TERM GOAL  #1   Title Patient will be independent in home exercise program to improve strength/mobility for better functional independence with ADLs.   Baseline does not have a HEP   Time 4   Period Weeks   Status On-going     PT LONG TERM GOAL #2   Title Patient (< 23 years old) will complete five times sit to stand test in < 10 seconds indicating an increased LE strength and improved balance.   Baseline unable to assess; does require BUE to push up on arm rests;    Time 4   Period Weeks   Status On-going     PT LONG TERM GOAL #3   Title Patient will increase 10 meter walk test to >0.8 m/s as to improve gait speed for better community ambulation and to reduce fall risk.   Baseline 0.34 m/s   Time 4   Period Weeks   Status On-going     PT LONG TERM GOAL #4   Title Patient will increase lower extremity functional scale to >40/80 to demonstrate improved functional mobility and increased tolerance with ADLs.    Baseline unable to assess at this time;    Time 4   Period Weeks   Status On-going               Plan - 12/04/16 1741    Clinical Impression Statement Patient instructed in gait safety with RW and loftstrand crutches. She demonstrates increased fatigue in BUE requiring max A with gait safety towards end of gait training. Patient has been walking at home without AD and falls regularly. She was instructed to start using the walker for now and will keep working on balance with other AD to find best AD for safety. Patient would benefit from additional skilled PT intervention to improve LE strength, balance and gait safety;    Rehab Potential Fair   Clinical Impairments Affecting Rehab Potential positive: Family support, negative: impaired balance, weakness, progress condition; Patient's clinical presentation is evolving due to fall risk;    PT Frequency One time visit   PT Duration 4 weeks   PT Treatment/Interventions ADLs/Self Care Home Management;Cryotherapy;Electrical  Stimulation;Gait training;Moist Heat;Stair training;Functional mobility training;Therapeutic activities;Therapeutic exercise;Balance training;Neuromuscular re-education;Manual techniques;Patient/family education;Passive range of motion;Dry needling;Energy conservation   PT Next Visit Plan continue as given   PT Home Exercise Plan continue as given;    Consulted and Agree with Plan of Care Patient      Patient will benefit from skilled therapeutic intervention in order to improve the following deficits and impairments:  Abnormal gait, Pain, Postural dysfunction, Decreased mobility, Decreased activity tolerance, Decreased endurance, Decreased range of motion, Decreased strength, Impaired flexibility, Difficulty walking, Decreased safety awareness, Decreased balance  Visit Diagnosis: Muscle weakness (generalized) - Plan: PT plan of care cert/re-cert  Unsteadiness on feet - Plan: PT plan of care cert/re-cert     Problem List Patient Active Problem List   Diagnosis Date Noted  . Loss of weight 07/17/2015  . Depression 07/17/2015  . Cerebral palsy (HCC) 07/17/2015    Trotter,Margaret  PT, DPT 12/04/2016, 5:44  PM   [place] [facility] MAIN [facility] SERVICES [address] [place], [place], [address] Phone: [phone]   Fax:  [phone]  Name: Evonda Enge MRN: [id number] Date of Birth: 03-02-1996

## 2016-12-16 ENCOUNTER — Ambulatory Visit: Payer: Medicaid Other | Admitting: Physical Therapy

## 2016-12-16 ENCOUNTER — Encounter: Payer: Self-pay | Admitting: Physical Therapy

## 2016-12-16 DIAGNOSIS — M6281 Muscle weakness (generalized): Secondary | ICD-10-CM | POA: Diagnosis not present

## 2016-12-16 DIAGNOSIS — R2681 Unsteadiness on feet: Secondary | ICD-10-CM

## 2016-12-16 NOTE — Therapy (Signed)
[place] MAIN [facility] SERVICES [address] [place], [place], [address] Phone: [phone]   Fax:  [phone]  Physical Therapy Treatment/Progress Note  Patient Details  Name: Renee Walton MRN: [id number] Date of Birth: [birth year] Referring Provider: Cletis Media MD  Encounter Date: 12/16/2016      PT End of Session - 12/17/16 0844    Visit Number 4   Number of Visits 17   Date for PT Re-Evaluation 01/13/17   Authorization Type medicaid (3 visits approved 1/2-2/26/18); visit3   PT Start Time 4158   PT Stop Time 1745   PT Time Calculation (min) 50 min   Equipment Utilized During Treatment Gait belt   Activity Tolerance Patient tolerated treatment well;No increased pain   Behavior During Therapy WFL for tasks assessed/performed      Past Medical History:  Diagnosis Date  . Cerebral palsy [facility])     Past Surgical History:  Procedure Laterality Date  . LEG SURGERY     bilateral x2 in 2008    There were no vitals filed for this visit.      Subjective Assessment - 12/17/16 0839    Subjective Patient reports compliance with HEP; She presents to therapy with RW; Denies any pain; Reports less falls since using walker;    Patient is accompained by: Family member   Pertinent History pertinent factors affecting rehab: cerebral palsy, high fall risk; good family support;    Limitations Standing;Walking   How long can you stand comfortably? 5-15 min   How long can you walk comfortably? 30 min;    Diagnostic tests none recent;    Patient Stated Goals be more independent, walk without person assist,    Currently in Pain? No/denies            [facility] PT Assessment - 12/16/16 0001      Transfers   Comments 34 sec without pushing on arm rests (>10 sec indicates high fall risk) required push from arm rest to push up as of 10/02/16     Standardized Balance Assessment   10 Meter Walk 0.57 m/s with RW (home ambulator, increased risk for  falls, improved from 10/02/16 which was 0.344 m/s)          TREATMENT:   PT educated patient in gait safety: PT instructed patient in gait with Lofstrand crutches in BUE x80 feet x2 with min verbal cues for crutch placement, weight shift and gait sequencing. She had difficulty with crutch placement demonstrating increased weakness in BUE with decreased weight bearing. However patient able to demonstrate improved weight bearing in BUE and better crutch use today as compared to last session;  Patient also required min-mod A during gait for safety with decreased trunk control;  Prone: On forearms: Push up scapular protraction x10 reps with mod VCs for positioning and to avoid back extension and isolate shoulder protraction; On forearms, push up to hands with full elbow extension and then back to forearms x5 reps with patient requiring demonstration and max VCs for increased elbow extension for improved tricep strengthening; qped: Push up x5 reps with mod VCs to keep shoulders forward over hands for better UE weight bearing;   Patient required min-moderate verbal/tactile cues for correct exercise technique. Advanced HEP- see patient instructions;   Patient instructed in 10 meter walk, 5 times sit<>stand and LEFS to assess progress towards goals; See above;  PT Education - 12/17/16 0840    Education provided Yes   Education Details HEP advanced, strengthening, gait safety;    Person(s) Educated Patient;Parent(s)   Methods Explanation;Demonstration;Verbal cues   Comprehension Verbalized understanding;Returned demonstration;Verbal cues required             PT Long Term Goals - 12/16/16 1729      PT LONG TERM GOAL #1   Title Patient will be independent in home exercise program to improve strength/mobility for better functional independence with ADLs.   Baseline does not have a HEP   Time 4   Period Weeks   Status Partially Met     PT LONG TERM  GOAL #2   Title Patient (< 82 years old) will complete five times sit to stand test in < 10 seconds indicating an increased LE strength and improved balance.   Baseline 34 sec without pushing on arm rests; improved from 10/02/16 where patient was unable to transfer without pushing on arm rests;    Time 4   Period Weeks   Status Partially Met     PT LONG TERM GOAL #3   Title Patient will increase 10 meter walk test to >0.8 m/s as to improve gait speed for better community ambulation and to reduce fall risk.   Baseline 0.57 m/s with RW, improved from 10/02/16 which was 0.34 m/s   Time 4   Period Weeks   Status Partially Met     PT LONG TERM GOAL #4   Title Patient will increase lower extremity functional scale to >40/80 to demonstrate improved functional mobility and increased tolerance with ADLs.    Baseline 8/80   Time 4   Period Weeks   Status On-going               Plan - 12/17/16 4782    Clinical Impression Statement Patient able to ambulate with B forearm crutches with min A. She is able to demonstrate better UE weight bearing and UE use during ambulation. She does continue to have weakness in BUE. Instructed patient in prone UE strengthening exercise. Patient had significant difficulty with extending arms into full extension in qped and prone due to weakness. Patient does demonstrate improvement in gait speed and sit to stand ability. She would benefit from additional skilled PT intervention to improve strength and mobility;    Rehab Potential Fair   Clinical Impairments Affecting Rehab Potential positive: Family support, negative: impaired balance, weakness, progress condition; Patient's clinical presentation is evolving due to fall risk;    PT Frequency 1x / week   PT Duration 4 weeks   PT Treatment/Interventions ADLs/Self Care Home Management;Cryotherapy;Electrical Stimulation;Gait training;Moist Heat;Stair training;Functional mobility training;Therapeutic  activities;Therapeutic exercise;Balance training;Neuromuscular re-education;Manual techniques;Patient/family education;Passive range of motion;Dry needling;Energy conservation   PT Next Visit Plan continue as given   PT Home Exercise Plan continue as given;    Consulted and Agree with Plan of Care Patient      Patient will benefit from skilled therapeutic intervention in order to improve the following deficits and impairments:  Abnormal gait, Pain, Postural dysfunction, Decreased mobility, Decreased activity tolerance, Decreased endurance, Decreased range of motion, Decreased strength, Impaired flexibility, Difficulty walking, Decreased safety awareness, Decreased balance  Visit Diagnosis: Muscle weakness (generalized)  Unsteadiness on feet     Problem List Patient Active Problem List   Diagnosis Date Noted  . Loss of weight 07/17/2015  . Depression 07/17/2015  . Cerebral palsy ([place]) 07/17/2015    Omarian Jaquith PT, DPT 12/17/2016, 8:57  [place] MAIN [facility] SERVICES [address] [place], [place], [address] Phone: [phone]   Fax:  [phone]  Name: Renee Walton MRN: [id number] Date of Birth: 1996/08/21

## 2016-12-16 NOTE — Patient Instructions (Addendum)
SHOULDER: Scapular Protraction - Prone    Use shoulder muscles to lift chest off floor, push elbows down and round upper back, (Try to flatten shoulder blades) Do not lift hips. __10_ reps per set, _2__ sets per day, _2__ days per week   Copyright  VHI. All rights reserved.  Therapeutic - Prone Press-Up    Lying on stomach on forearms, push up on hands, then come back to forearms. Hold __2__ seconds. Return to prone position. Repeat _10___ times.  Copyright  VHI. All rights reserved.  All Fours Push-Up    On all fours with hands shoulder-width apart, bend elbows and perform a push-up. Return to start position. BE SURE TO KEEP HANDS UNDER SHOULDERS Repeat _5-10___ times or Do __2__ sessions per day.  http://cc.exer.us/62   Copyright  VHI. All rights reserved.

## 2016-12-17 ENCOUNTER — Ambulatory Visit: Payer: Self-pay | Admitting: Physical Therapy

## 2017-09-18 ENCOUNTER — Encounter: Payer: Self-pay | Admitting: Physical Therapy

## 2017-09-18 ENCOUNTER — Ambulatory Visit: Payer: Medicaid Other | Attending: Internal Medicine | Admitting: Physical Therapy

## 2017-09-18 DIAGNOSIS — M6281 Muscle weakness (generalized): Secondary | ICD-10-CM | POA: Insufficient documentation

## 2017-09-18 DIAGNOSIS — R2681 Unsteadiness on feet: Secondary | ICD-10-CM

## 2017-09-18 NOTE — Therapy (Addendum)
[facility] [address] [place], [place], [address] Phone: [phone]   Fax:  [phone]  Physical Therapy Evaluation  Patient Details  Name: Renee BaldFatima Carden MRN: [id number] Date of Birth: [birth year] Referring Provider: Margaretann LovelessKHAN, NEELAM S   Encounter Date: 09/18/2017  PT End of Session - 09/18/17 1131    Visit Number  1    Number of Visits  1    PT Start Time  0915    PT Stop Time  1015    PT Time Calculation (min)  60 min    Equipment Utilized During Treatment  Gait belt    Activity Tolerance  Patient tolerated treatment well;No increased pain    Behavior During Therapy  WFL for tasks assessed/performed       Past Medical History:  Diagnosis Date  . Cerebral palsy [facility](HCC)     Past Surgical History:  Procedure Laterality Date  . LEG SURGERY     bilateral x2 in 2008    There were no vitals filed for this visit.   Subjective Assessment - 09/18/17 0922    Subjective  Patient reprots doing her exercises with her Moms help. She wants to live independently. She wants her mom to help less. She says that she has some hip pain in her left hip and she falls every week as much as 20-30 times in the last 6 months.     Patient is accompained by:  Family member    Pertinent History  pertinent factors affecting rehab: cerebral palsy, high fall risk; good family support; Patient is walking at home with her RW.     Limitations  Standing;Walking    How long can you stand comfortably?  5-15 min    How long can you walk comfortably?  30 min;     Diagnostic tests  none recent;     Patient Stated Goals  be more independent, walk without person assist,     Currently in Pain?  Yes    Pain Score  7     Pain Location  Knee    Pain Orientation  Right;Left    Pain Descriptors / Indicators  Cramping    Pain Type  Chronic pain    Pain Onset  More than a month ago    Pain Frequency  Constant    Aggravating Factors   stress    Effect of  Pain on Daily Activities  exercises    Multiple Pain Sites  No         [facility] PT Assessment - 09/18/17 0001      Assessment   Medical Diagnosis  Cerebral Palsy    Referring Provider  Yves DillKHAN, NEELAM S    Onset Date/Surgical Date  07/08/84    Hand Dominance  Left    Next MD Visit  march 2019    Prior Therapy  -- Patient had PT 10 months ago for the same reasons;      Precautions   Precautions  Fall      Restrictions   Weight Bearing Restrictions  No      Balance Screen   Has the patient fallen in the past 6 months  Yes    How many times?  20    Has the patient had a decrease in activity level because of a fear of falling?   No    Is the patient reluctant to leave their home because of a fear of falling?   No  Home Environment   Living Environment  Private residence    Available Help at Discharge  Family    Type of Home  House    Home Access  Level entry    Home Layout  Two level    Alternate Level Stairs-Number of Steps  12    Alternate Level Stairs-Rails  Right    Home Equipment  Walker - 2 wheels    Additional Comments  Lives with family; level entry home, lives in 2 story home with bedroom/bathroom on 2nd floor; has 10 steps to get to 2nd floor, left rail, negotiates one step at a time; needs help with meal prep, mom helps with bathing/dressing; she helps with getting on/off shoes,       Prior Function   Level of Independence  Requires assistive device for independence;Needs assistance with ADLs;Needs assistance with gait    Leisure  play on ipad;       Cognition   Overall Cognitive Status  History of cognitive impairments - at baseline       PAIN: knee pain 7/10 BLE  POSTURE: WFL   Coordination : heel shin test impaired BLE  PROM/AROM:BUE is WFL; BLE PROM is WFL, AROM is limited due to tone and weakness;       Strength   Overall Strength Comments BUE grossly 4/5   Right Hip Flexion 3-/5   Right Hip Extension 2/5   Right Hip ABduction 2-/5   Right  Hip ADduction 2+/5   Left Hip Flexion 3/5   Left Hip Extension 2/5   Left Hip ABduction 2-/5   Left Hip ADduction 2+/5   Right Knee Flexion 4-/5   Right Knee Extension 3-/5   Left Knee Flexion 4-/5   Left Knee Extension 2+/5   Right Ankle Dorsiflexion 2-/5   Left Ankle Dorsiflexion 2/5   SENSATION: intact  FUNCTIONAL MOBILITY:  Patient transfers with use of UE or without UE support and uses the back of LE's for stabiity   BALANCE: able to stand without AD with feet apart, needs assistance to avoid loss of balance when taking a step;   GAIT:   Ambulates with RW with decreased speed, step length, increased RLE toe out, flat foot at initial contact with foot drag, lateral trunk sway, poor hip and knee mobility/control, narrow base of support and feet crossing occasionally.  OUTCOME MEASURES: TEST Outcome Interpretation  5 times sit<>stand 44.38sec 24>60 yo, >15 sec indicates increased risk for falls  10 meter walk test    .24              m/s <1.0 m/s indicates increased risk for falls; limited community ambulator; high fall risk, limited home ambulator)  Timed up and Go    68.08            sec <14 sec indicates increased risk for falls                                    PT Education - 09/18/17 0939    Education provided  Yes    Education Details  plan of care    Person(s) Educated  Patient    Methods  Explanation    Comprehension  Verbalized understanding       PT Short Term Goals - 09/18/17 1135      PT SHORT TERM GOAL #1   Title  Patient and family members will be  indpendent with HEP and understand they need to do the HEP at home.    Time  1    Period  Days    Status  Achieved                Plan - 09/18/17 1135    Clinical Impression Statement  21 yo Female diagnosed with cerebral palsy, presents to therapy with increased weakness and decreased mobility. Patient reports walking with AD and requiring assistance from family for  most ADLs.  Patient demonstrates increased BLE extensor tone and decreased coordination and motor control of LE's. She reports that she has been stretching regularly and doing her HEP. She reports multiple falls each week.  Patients HEP was reviewed and recommended that she conintue her HEP and no further PT is recommended at this time. She would also benefit from evaluation for wc to reduce falls at home and provide a safer environment.        Patient will benefit from skilled therapeutic intervention in order to improve the following deficits and impairments:     Visit Diagnosis: Muscle weakness (generalized) - Plan: PT plan of care cert/re-cert  Unsteadiness on feet - Plan: PT plan of care cert/re-cert     Problem List Patient Active Problem List   Diagnosis Date Noted  . Loss of weight 07/17/2015  . Depression 07/17/2015  . Cerebral palsy (HCC) 07/17/2015    Ezekiel Ina, PT DPT 09/18/2017, 4:50 PM  [facility] MAIN [facility] SERVICES [address] [place], [place], [address] Phone: [phone]   Fax:  [phone]  Name: Renee Walton MRN: [id number] Date of Birth: 1996-03-06

## 2017-09-18 NOTE — Addendum Note (Signed)
Addended by: Ezekiel InaMANSFIELD, Ainhoa Rallo S on: 09/18/2017 04:54 PM   Modules accepted: Orders

## 2017-09-25 ENCOUNTER — Ambulatory Visit: Payer: Self-pay | Admitting: Physical Therapy

## 2017-10-01 ENCOUNTER — Ambulatory Visit: Payer: Self-pay | Admitting: Physical Therapy

## 2017-10-08 ENCOUNTER — Ambulatory Visit: Payer: Self-pay | Admitting: Physical Therapy

## 2017-10-28 ENCOUNTER — Encounter: Payer: Self-pay | Admitting: Physical Therapy

## 2017-10-28 ENCOUNTER — Ambulatory Visit: Payer: Medicaid Other | Attending: Internal Medicine | Admitting: Physical Therapy

## 2017-10-28 DIAGNOSIS — R2681 Unsteadiness on feet: Secondary | ICD-10-CM | POA: Diagnosis not present

## 2017-10-28 DIAGNOSIS — M6281 Muscle weakness (generalized): Secondary | ICD-10-CM | POA: Diagnosis present

## 2017-10-28 DIAGNOSIS — G809 Cerebral palsy, unspecified: Secondary | ICD-10-CM | POA: Diagnosis present

## 2017-10-28 NOTE — Therapy (Signed)
[place] [facility] [address] [place], [place], [address] Phone: [phone]   Fax:  [phone]  Physical Therapy Wheelchair Evaluation  Patient Details  Name: Renee Walton MRN: [id number] Date of Birth: 1995/12/28 Referring Provider: Perrin Maltese   Encounter Date: 10/28/2017  PT End of Session - 10/28/17 0908    Visit Number  1    Number of Visits  1    Date for PT Re-Evaluation  10/28/17    Authorization Type  medicaid    PT Start Time  0915    PT Stop Time  1030    PT Time Calculation (min)  75 min    Equipment Utilized During Treatment  Gait belt    Activity Tolerance  Patient tolerated treatment well;No increased pain    Behavior During Therapy  WFL for tasks assessed/performed       Past Medical History:  Diagnosis Date  . Cerebral palsy [facility])     Past Surgical History:  Procedure Laterality Date  . LEG SURGERY     bilateral x2 in 2008    There were no vitals filed for this visit.         PATIENT INFORMATION: This Evaluation form will serve as the LMN for the following suppliers:  Supplier: [employer] [facility] [employer] Contact Person: Yvone Neu Phone: [phone]   Reason for Referral: Power wheelchair eval due to cerebral palsy and frequent falls Patient/caregiver Goals: "I want to be safer in my home."  Patient was seen for face-to-face evaluation for new power wheelchair.  Also present was    [employer]                    to discuss recommendations and wheelchair options.  Further paperwork was completed and sent to vendor.  Patient appears to qualify for power mobility device at this time per objective findings.   MEDICAL HISTORY: cerebral palsy (G80.9) Diagnosis:Cerebral Palsy Primary Diagnosis Onset: Birth [] Progressive Disease Relevant Past and Future Surgeries: 5 surgeries in [place] approx 2010- hamstring/calf release and hip surgery;  Height: 5'2" Weight: 82# Explain  and recent changes or trends in weight: none; did lose 5 pounds in 2 months  Relevant History including falls: Patient lives with family; She uses RW for ambulation but reports multiple falls; 2-3 falls in last month;      HOME ENVIRONMENT: [x] House  [] Condo/town home  [] Apartment  [] Assisted Living    [] Lives Alone [x]  Lives with Others                                                    Hours with caregiver: 24/7  [] Home is accessible to patient            Stairs  [x] Yes []  No     Ramp [] Yes [x] No Comments:  No stairs to enter home; does have 2 story home with 10 steps to 2nd floor; patient able to live on main floor; bathroom on main floor;    COMMUNITY ADL: TRANSPORTATION: [] Car    [] Van    [x] Public Transportation    [] Adapted w/c Lift   [] Ambulance   [x] Other:     Family states that they can buy a Government social research officer to transport the chair [] Sits in wheelchair during transport  Employment/School:    none Specific  requirements pertaining to mobility                                                     Other:                                       FUNCTIONAL/SENSORY PROCESSING SKILLS:  Handedness:   [] Right     [x] Left    [] NA  Comments:                                 Functional Processing Skills for Wheeled Mobility [x] Processing Skills are adequate for safe wheelchair operation  Areas of concern than may interfere with safe operation of wheelchair Description of problem   []  Attention to environment     [] Judgment     []  Hearing  []  Vision or visual processing    [] Motor Planning  []  Fluctuations in Behavior                                                   VERBAL COMMUNICATION: [x] WFL receptive [x]  WFL expressive [] Understandable  [] Difficult to understand  [] non-communicative []  Uses an augmented communication device    CURRENT SEATING / MOBILITY: Current Mobility Base:   [x] None  [] Dependent  [] Manual  [] Scooter  [] Power   Type of Control:                        Manufacturer:                         Size:                         Age:                           Current Condition of Mobility Base:                                                                                                                     Current Wheelchair components:  Describe posture in present seating system:                                                                            SENSATION and SKIN ISSUES: Sensation [x] Intact [] Impaired [] Absent   Level of sensation:                           Pressure Relief: Able to perform effective pressure relief :   [x] Yes  []  No Method:  Able to transfer or shift weight;                                                                             If not, Why?:                                                                          Skin Issues/Skin Integrity Current Skin Issues   [] Yes [x] No  [x] Intact []  Red area []  Open Area  [] Scar Tissue [] At risk from prolonged sitting  Where                              History of Skin Issues   [] Yes [x] No  Where                                         When                                               Hx of skin flap surgeries [] Yes [x] No  Where                  When                                                  Limited sitting tolerance [] Yes [x] No Hours spent sitting in wheelchair daily:      Able to sit for 1-2 hours without difficulty;  Complaint of Pain:  Please describe:   none                                                                                                          Swelling/Edema:    none                                                                                                                                              ADL STATUS (in reference to wheelchair  use):  Indep Assist Unable Indep with Equip Not assessed Comments  Dressing                X                                       Needs help with upper and lower body dressing; Mod A required                  Eating   X                                                                                                                           Toileting   X  Bathing             X                                                 needs max A; sits on bench built in;                                                                         Grooming/ Hygiene               X                                              Needs help brushing teeth and brushing hair;                                                                  Meal Prep                             X                                                                                             IADLS                 X                                                                                                 Bowel Management: [x] Continent  [] Incontinent  [] Accidents Comments:                                                  Bladder Management: [x] Continent  [] Incontinent  [] Accidents Comments:  WHEELCHAIR SKILLS: Manual w/c Propulsion: [] UE or LE strength and endurance sufficient to participate in ADLs using manual wheelchair Arm :  [] left [] right  [] Both                                   Foot:   [] left [] right  [] Both  Distance:   Operate Scooter: []  Strength, hand grip, balance and transfer appropriate for use [] Living environment is accessible for use of scooter  Operate Power w/c:  [x]  Std. Joystick   []  Alternative Controls Indep [x]  Assist []  Dependent/ Unable []  N/A []  [x] Safe          []  Functional      Distance:                Bed confined without  wheelchair []  Yes [x]  No   STRENGTH/RANGE OF MOTION:  Range of Motion Strength  Shoulder       WFL                                          Grossly 3/5  Elbow       WFL                                         Grossly 3/5  Wrist/Hand       WFL Grip: R: 20#, L: 20#  Hip      Limited hip flexion/abduction against gravity                                                        Hip: flexion: 3-/5, abduction: 2/5, adduction 3/5, extension 2+/5  Knee     Limited knee flexion against gravity due to extenstor tone;  Knee: extension: 4/5 Flexion: 2-/5  Ankle Limited DF and PF Grossly 2-/5      MOBILITY/BALANCE:  []  Patient is totally dependent for mobility                                                                                               Balance Transfers Ambulation  Sitting Balance: Standing Balance: []  Independent []  Independent/Modified Independent  []  WFL     []  WFL []  Supervision []  Supervision  [x]  Uses UE for balance  []  Supervision [x]  Min Assist using RW [x]  Ambulates with Assist                           []  Min Assist [x]  Min assist []  Mod Assist []  Ambulates with Device:  [x]  RW   []  StW   []  Cane   []                 []   Mod Assist []  Mod assist []  Max assist   []  Max Assist []  Max assist []  Dependent []  Indep. Short Distance Only  []  Unable []  Unable []  Lift / Sling Required Distance (in feet)           50 with min A for safety; 1.0 m/s with RW with min A for safety; Patient does walk slower when not being timed with increased lateral trunk sway and decreased hip flexion with increased foot drag;                 []  Sliding board []  Unable to Ambulate: (Explain:  Cardio Status:  [x] Intact  []  Impaired   []  NA                              Respiratory Status:  [x] Intact   [] Impaired   [] NA                                     Orthotics/Prosthetics:    Does not wear any orthotics;                                                                      Comments (Address manual vs  power w/c vs scooter):  Yuleimy is able to walk short distances with a RW. However she is unsafe with multiple falls in her home. She is dependent on family for most ADLs including bathing, dressing and meal prep. With a power chair Hadlyn would be more independent in her home being able to brush her teeth/hair, fix a meal, go between rooms without falling; She is not appropriate for a manual chair due to weakness in UE and lack of coordination; In addition, she exhibits increased LE extensor tone when activating UE which would make her unsafe in the manual wheelchair; Patient is not appropriate for a scooter due to increased tone (Grade 3 modified ashworth) extensor tone in LE that is activated with poor trunk support and UE movement; A power chair would provide enough support so that patient is more independent in home ADLs;                                                            Anterior / Posterior Obliquity Rotation-Pelvis  PELVIS    [x] Neutral  []  Posterior  []  Anterior     [x] WFL  [] Right Elevated  [] Left Elevated   [x] WFL  [] Right Anterior []   Left Anterior    []  Fixed [x]  Partly Flexible []  Flexible  []  Other  []  Fixed  [x]  Partly Flexible  []  Flexible []  Other  []  Fixed  [x]  Partly Flexible  []  Flexible []  Other  TRUNK [] WFL [x] Thoracic Kyphosis [] Lumbar Lordosis   [x]  WFL [] Convex Right [] Convex Left   [] c-curve [] s-curve [] multiple  [x]  Neutral []  Left-anterior []  Right-anterior    []  Fixed [x]  Flexible []  Partly Flexible  Other  []  Fixed [x]  Flexible []  Partly Flexible []  Other  []  Fixed           [x]  Flexible []  Partly Flexible []  Other   Position Windswept   HIPS  []  Neutral [x]  Abduct []  ADduct [x]  Neutral []  Right []  Left       []  Fixed  [x]  Partly Flexible             []  Dislocated []  Flexible []  Subluxed    []  Fixed [x]  Partly Flexible  []  Flexible []  Other              Foot Positioning Knee  Positioning   Knees and  Feet  [x]  WFL [] Left [] Right [x]  WFL [] Left [] Right   KNEES ROM concerns: ROM concerns:   & Dorsi-Flexed                    [] Lt [] Rt                                  FEET Plantar Flexed                  [] Lt [] Rt     Inversion                    [] Lt [] Rt     Eversion                    [] Lt [] Rt    HEAD [x]  Functional []  Good Head Control   & []  Flexed         []  Extended [x]  Adequate Head Control   NECK []  Rotated  Lt  []  Lat Flexed Lt []  Rotated  Rt []  Lat Flexed Rt []  Limited Head Control    []  Cervical Hyperextension []  Absent  Head Control    SHOULDERS ELBOWS WRIST& HAND         Left     Right    Left     Right  U/E [x] Functional  Left            [x] Functional  Right                               [] Fisting             [] Fisting     [] elevated Left [] depressed  Left [] elevated Right [] depressed  Right      [] protracted Left [] retracted Left [] protracted Right [] retracted Right [] subluxed  Left              [] subluxed  Right         Goals for Wheelchair Mobility  [x]  Independence with mobility in the home with motor related ADLs (MRADLs)  [x]  Independence with MRADLs in the community []  Provide dependent mobility  []  Provide recline     [] Provide tilt   Goals for Seating system [x]  Optimize pressure distribution [x]  Provide support needed to facilitate function or safety []  Provide corrective forces to assist with maintaining or improving posture []  Accommodate client's posture: current seated postures and positions are not flexible or will not tolerate corrective forces [x]  Client to be independent with relieving pressure in the wheelchair [] Enhance physiological function such as breathing, swallowing, digestion  Simulation ideas/Equipment trials:  State why other equipment was unsuccessful:                                                                                  MOBILITY BASE RECOMMENDATIONS and JUSTIFICATION: MOBILITY COMPONENT JUSTIFICATION  Manufacturer:  [id number] edge Model:              Size: Width  14         Seat Depth   16          [x] provide transport from point A to B [x] promote Indep mobility  [x] is not a safe, functional ambulator [x] walker or cane inadequate [] non-standard width/depth necessary to accommodate anatomical measurement []                             [] Manual Mobility Base [] non-functional ambulator    [] Scooter/POV  [] can safely operate  [] can safely transfer   [] has adequate trunk stability  [] cannot functionally propel manual w/c  [x] Power Mobility Base  [] non-ambulatory  [x] cannot functionally propel manual wheelchair  [x]  cannot functionally and safely operate scooter/POV [x] can safely operate and willing to  [] Stroller Base [] infant/child  [] unable to propel manual wheelchair [] allows for growth [] non-functional ambulator [] non-functional UE [] Indep mobility is not a goal at this time  [] Tilt  [] Forward                   [] Backward                  [] Powered tilt              [] Manual tilt  [] change position against gravitational force on head and shoulders  [] change position for pressure relief/cannot weight shift [] transfers  [] management of tone [] rest periods [] control edema [] facilitate postural control  []                                       [] Recline  [] Power recline on power base [] Manual recline on manual base  [] accommodate femur to back angle  [] bring to full recline for ADL care  [] change position for pressure relief/cannot weight shift [] rest periods [] repositioning for transfers or clothing/diaper /catheter changes [] head positioning  [] Lighter weight required [] self- propulsion  [] lifting []                                                 [] Heavy Duty required [] user weight greater than 250# [] extreme tone/ over active movement [] broken frame on previous  chair []                                     [x]  Back J3 [x]  Angle Adjustable []  Custom molded                           [x] postural control [x] control of tone/spasticity [] accommodation  of range of motion [] UE functional control [] accommodation for seating system []                                          [] provide lateral trunk support [] accommodate deformity [] provide posterior trunk support [] provide lumbar/sacral support [x] support trunk in midline [] Pressure relief over spinal processes  [x]  Seat Cushion    Ridge                   [] impaired sensation  [] decubitus ulcers present [] history of pressure ulceration [] prevent pelvic extension [x] low maintenance  [] stabilize pelvis  [] accommodate obliquity [] accommodate multiple deformity [x] neutralize lower extremity position [x] increase pressure distribution []                                           []  Pelvic/thigh support  []  Lateral thigh guide []  Distal medial pad  []  Distal lateral pad []  pelvis in neutral [] accommodate pelvis []  position upper legs []  alignment []  accommodate ROM []  decrease adduction [] accommodate tone [] removable for transfers [] decrease abduction  []  Lateral trunk Supports []  Lt     []  Rt [] decrease lateral trunk leaning [] control tone [] contour for increased contact [] safety  [] accommodate asymmetry []                                                 [x]  Mounting hardware  [] lateral trunk supports  [x] back   [] seat [] headrest      []  thigh support [] fixed   [] swing away [] attach seat platform/cushion to w/c frame [x] attach back cushion to w/c frame [] mount postural supports [] mount headrest  [] swing medial thigh support away [] swing lateral supports away for transfers  []                                                     Armrests  [] fixed [x] adjustable height [] removable   [] swing away  [x] flip back   [] reclining [x] full length pads [] desk    [] pads tubular  [x] provide  support with elbow at 90   [] provide support for w/c tray [x] change of height/angles for variable activities [x] remove for transfers [x] allow to come closer to table top [] remove for access to tables []                                               Hangers/ Leg rests  [] 60 [] 70 [] 90 [] elevating [] heavy duty  [] articulating [] fixed [] lift off [] swing away     [] power [] provide LE support  [] accommodate to hamstring tightness [] elevate legs during recline   [] provide change in position for Legs [] Maintain placement of feet on footplate [] durability [] enable transfers [] decrease edema [] Accommodate lower leg length []   Foot support Footplate    [] Lt  []  Rt  [x]  Center mount [x] flip up                            [] depth/angle adjustable [] Amputee adapter    []  Lt     []  Rt [x] provide foot support [] accommodate to ankle ROM [x] transfers [] Provide support for residual extremity []  allow foot to go under wheelchair base []  decrease tone  []                                                 []  Ankle strap/heel loops [] support foot on foot support [] decrease extraneous movement [] provide input to heel  [] protect foot  Tires: [] pneumatic  [x] flat free inserts  [] solid  [x] decrease maintenance  [x] prevent frequent flats [x] increase shock absorbency [] decrease pain from road shock [x] decrease spasms from road shock []                                              []  Headrest  [] provide posterior head support [] provide posterior neck support [] provide lateral head support [] provide anterior head support [] support during tilt and recline [] improve feeding   [] improve respiration [] placement of switches [] safety  [] accommodate ROM  [] accommodate tone [] improve visual orientation  []  Anterior chest strap []  Vest []  Shoulder retractors  [] decrease forward movement of shoulder [] accommodation of TLSO [] decrease forward movement of trunk [] decrease  shoulder elevation [] added abdominal support [] alignment [] assistance with shoulder control  []                                               Pelvic Positioner [x] Belt [] SubASIS bar [] Dual Pull [x] stabilize tone [x] decrease falling out of chair/ **will not Decrease potential for sliding due to pelvic tilting [] prevent excessive rotation [] pad for protection over boney prominence [] prominence comfort [] special pull angle to control rotation []                                                  Upper ExtremitySupport  [] L   []  R [] Arm trough   [] hand support []  tray       [] full tray [] swivel mount [] decrease edema      [] decrease subluxation   [] control tone   [] placement for AAC/Computer/EADL [] decrease gravitational pull on shoulders [] provide midline positioning [] provide support to increase UE function [] provide hand support in natural position [] provide work surface   POWER WHEELCHAIR CONTROLS  [x] Proportional  [] Non-Proportional Type                                      [x] Left  [] Right [x] provides access for controlling wheelchair   [] lacks motor control to operate proportional drive control [] unable to understand proportional controls  Actuator Control Module  [] Single  [] Multiple   [] Allow the client to operate the power seat function(s) through the joystick control   []   Safety Reset Switches [] Used to change modes and stop the wheelchair when driving in latch mode    [] Upgraded Electronics   [] programming for accurate control [] progressive Disease/changing condition [] non-proportional drive control needed [] Needed in order to operate power seat functions through joystick control   [] Display box [] Allows user to see in which mode and drive the wheelchair is set  [] necessary for alternate controls    [] Digital interface electronics [] Allows w/c to operate when using alternative drive controls  [] ASL Head Array [] Allows client to operate wheelchair  through switches  placed in tri-panel headrest  [] Sip and puff with tubing kit [] needed to operate sip and puff drive controls  [] Upgraded tracking electronics [] increase safety when driving [] correct tracking when on uneven surfaces  [x] Mount for switches or joystick [] Attaches switches to w/c  [x] Swing away for access or transfers [] midline for optimal placement [] provides for consistent access  [] Attendant controlled joystick plus mount [] safety [] long distance driving [] operation of seat functions [] compliance with transportation regulations []                                             Rear wheel placement/Axle adjustability [] None [] semi adjustable [] fully adjustable  [] improved UE access to wheels [] improved stability [] changing angle in space for improvement of postural stability [] 1-arm drive access [] amputee pad placement []                                Wheel rims/ hand rims  [] metal   [] plastic coated [] oblique projections           [] vertical projections [] Provide ability to propel manual wheelchair  []  Increase self-propulsion with hand weakness/decreased grasp  Push handles [] extended   [] angle adjustable              [] standard [] caregiver access [] caregiver assist [] allows "hooking" to enable increased ability to perform ADLs or maintain balance  One armed device   [] Lt   [] Rt [] enable propulsion of manual wheelchair with one arm   []                                            Brake/wheel lock extension []  Lt   []  Rt [] increase indep in applying wheel locks   [] Side guards [] prevent clothing getting caught in wheel or becoming soiled []  prevent skin tears/abrasions  Battery:        yes                                    [x] to power wheelchair                                                         Other:  The above equipment has a life- long use expectancy. Growth and  changes in medical and/or functional conditions would be the exceptions. This is to certify that the therapist has no financial relationship with durable medical provider or manufacturer. The therapist will not receive remuneration of any kind for the equipment recommended in this evaluation.   Patient has mobility limitation that significantly impairs safe, timely participation in one or more mobility related ADL's. (bathing, toileting, feeding, dressing, grooming, moving from room to room)  [x]  Yes []  No  Will mobility device sufficiently improve ability to participate and/or be aided in participation of MRADL's?      [x]  Yes []  No  Can limitation be compensated for with use of a cane or walker?                                    []  Yes [x]  No  Does patient or caregiver demonstrate ability/potential ability & willingness to safely use the mobility device?    [x]  Yes []  No  Does patient's home environment support use of recommended mobility device?            [x]  Yes []  No  Does patient have sufficient upper extremity function necessary to functionally propel a manual wheelchair?     []  Yes [x]  No  Does patient have sufficient strength and trunk stability to safely operate a POV (scooter)?                                  []  Yes [x]  No  Does patient need additional features/benefits provided by a power wheelchair for MRADL's in the home?        [x]  Yes []  No  Does the patient demonstrate the ability to safely use a power wheelchair?                   [x]  Yes []  No     Physician's Name Printed:     Lamonte Sakai MD                                                   28 Signature:  Date:     This is to certify that I, the above signed therapist have the following affiliations: []  This DME provider []  Manufacturer of recommended equipment []  Patient's long term care facility [x]  None of the above  Therapist Name/Signature:                                             Date:          Objective measurements completed on examination: See above findings.              PT Education - 10/28/17 0907    Education provided  Yes    Education Details  recommendations for wheelchair    Person(s) Educated  Patient    Methods  Explanation    Comprehension  Verbalized understanding       PT Short Term Goals - 09/18/17 1135      PT SHORT TERM GOAL #1  Title  Patient and family members will be indpendent with HEP and understand they need to do the HEP at home.    Time  1    Period  Days    Status  Achieved        PT Long Term Goals - 10/28/17 0910      PT LONG TERM GOAL #1   Title  Pt and caregivers will understand PT recommendation and appropriate/safe use for wheelchair and seating for home use.    Time  1    Period  Weeks    Status  Achieved    Target Date  11/04/17             Plan - 10/28/17 0908    Clinical Impression Statement  patient evaluated for power wheelchair; She would benefit from power chair to improve safety in home as she falls often when walking. Patient has significant weakness in UE and incoordination that would limit her ability to propel a manual wheelchair;     History and Personal Factors relevant to plan of care:  multiple falls, limited compliance with stretching and exercise; good caregiver support    Clinical Presentation  Evolving    Clinical Presentation due to:  walks with multiple falls;     Clinical Decision Making  Moderate    Rehab Potential  Good    Clinical Impairments Affecting Rehab Potential  negative: lack of compliance with stretches and HEP    PT Frequency  One time visit    PT Treatment/Interventions  Patient/family education;Wheelchair mobility training    Consulted and Agree with Plan of Care  Patient;Family member/caregiver    Family Member Consulted  mom       Patient will benefit from skilled therapeutic intervention in order to improve the following deficits and  impairments:  Abnormal gait, Decreased safety awareness, Decreased balance  Visit Diagnosis: Unsteadiness on feet  Muscle weakness (generalized)  Cerebral palsy, unspecified type [facility])     Problem List Patient Active Problem List   Diagnosis Date Noted  . Loss of weight 07/17/2015  . Depression 07/17/2015  . Cerebral palsy ([place]) 07/17/2015    Trotter,Margaret PT, DPT 10/28/2017, 10:21 AM  [place] MAIN [facility] SERVICES [address] [place], [place], [address] Phone: [phone]   Fax:  [phone]  Name: Maryn Freelove MRN: [id number] Date of Birth: [birth year]

## 2017-11-06 ENCOUNTER — Telehealth: Payer: Self-pay

## 2017-11-06 NOTE — Telephone Encounter (Signed)
Copied from CRM [phone]#[id number]. Topic: Inquiry >> Nov 06, 2017  3:06 PM Alexander BergeronBarksdale, Harvey B wrote: Reason for CRM: Id[facility] Needleaige [facility] called and asked to have the notes from the 1.8.19 visit so that they can give the pt her wheelchair fax the encounter to [phone], contact the phone # also if needed

## 2017-11-06 NOTE — Telephone Encounter (Signed)
Paper work has been faxed

## 2017-12-09 ENCOUNTER — Other Ambulatory Visit: Payer: Self-pay | Admitting: Internal Medicine

## 2018-03-23 ENCOUNTER — Ambulatory Visit (INDEPENDENT_AMBULATORY_CARE_PROVIDER_SITE_OTHER): Payer: Medicaid Other | Admitting: Podiatry

## 2018-03-23 ENCOUNTER — Encounter: Payer: Self-pay | Admitting: Podiatry

## 2018-03-23 DIAGNOSIS — B351 Tinea unguium: Secondary | ICD-10-CM

## 2018-03-23 DIAGNOSIS — M79674 Pain in right toe(s): Secondary | ICD-10-CM | POA: Diagnosis not present

## 2018-03-23 DIAGNOSIS — L603 Nail dystrophy: Secondary | ICD-10-CM | POA: Diagnosis not present

## 2018-03-23 DIAGNOSIS — G809 Cerebral palsy, unspecified: Secondary | ICD-10-CM

## 2018-03-23 NOTE — Progress Notes (Signed)
This patient presents the office with chief complaint of long thick toenail on her third toe, right foot.  She says this toenail has been thick and long for over 2-3 years.  She says she is unable to self treat.  She states that she is not having any pain or discomfort to the nail third toe, right foot.  She does have a history of cerebral palsy.  She presents to the office with her mother who does not speak [place]  and a  Female who translates the  Visit.  She denies any history of trauma or drainage from the toenail.  She presents the office today for an evaluation and treatment of this third toenail, right foot.  General Appearance  Alert, conversant and in no acute stress.  Vascular  Dorsalis pedis and posterior tibial  pulses are palpable  bilaterally.  Capillary return is within normal limits  bilaterally. Temperature is within normal limits  Bilaterally. Hair present on digits  B/L.  Neurologic  Deferred.  Nails Thick disfigured discolored nails with subungual debris  Third toenail right foot.  Orthopedic  No limitations of motion of motion feet .  No crepitus or effusions noted.  No bony pathology or digital deformities noted.  Skin  normotropic skin with no porokeratosis noted bilaterally.  No signs of infections or ulcers noted.    Onychomycosis  3rd right   IE  Debride nail right foot.  Discussed this condition with this patient and family.  I did not recommend Lamisil treatment for this patient nor surgery for the removal of third toenail right foot. Since there was no evidence of any infection or drainage or pain. I did not feel it was necessary to perform a permanent nail removal.   Helane GuntherGregory Nakeshia Waldeck DPM

## 2018-04-03 ENCOUNTER — Other Ambulatory Visit: Payer: Self-pay | Admitting: Internal Medicine

## 2018-04-03 DIAGNOSIS — R1310 Dysphagia, unspecified: Secondary | ICD-10-CM

## 2018-04-08 ENCOUNTER — Ambulatory Visit
Admission: RE | Admit: 2018-04-08 | Discharge: 2018-04-08 | Disposition: A | Discharge status: Home / Self Care | Payer: Medicaid Other | Source: Ambulatory Visit | Attending: Internal Medicine | Admitting: Internal Medicine

## 2018-04-08 DIAGNOSIS — R1310 Dysphagia, unspecified: Secondary | ICD-10-CM | POA: Insufficient documentation

## 2018-08-03 ENCOUNTER — Telehealth: Payer: Self-pay | Admitting: Obstetrics & Gynecology

## 2018-08-03 NOTE — Telephone Encounter (Signed)
[facility] referring for [place] irregularity. Paper records no pap smear in file. Called and left voicemail for patient to call back to be schedule

## 2018-08-06 NOTE — Telephone Encounter (Signed)
Called and left voice mail for patient to call back to be schedule °

## 2018-08-10 NOTE — Telephone Encounter (Signed)
Called and left voice mail for patient to call back to be schedule °

## 2018-09-02 ENCOUNTER — Ambulatory Visit (INDEPENDENT_AMBULATORY_CARE_PROVIDER_SITE_OTHER): Payer: Medicaid Other | Admitting: Obstetrics and Gynecology

## 2018-09-02 ENCOUNTER — Encounter: Payer: Self-pay | Admitting: Obstetrics and Gynecology

## 2018-09-02 VITALS — BP 120/80 | HR 98

## 2018-09-02 DIAGNOSIS — N921 Excessive and frequent menstruation with irregular cycle: Secondary | ICD-10-CM

## 2018-09-02 DIAGNOSIS — Z30011 Encounter for initial prescription of contraceptive pills: Secondary | ICD-10-CM

## 2018-09-02 MED ORDER — NORETHIN-ETH ESTRAD-FE BIPHAS 1 MG-10 MCG / 10 MCG PO TABS: 1.0000 | ORAL_TABLET | Freq: Every day | ORAL | 3 refills | Status: DC

## 2018-09-02 NOTE — Patient Instructions (Signed)
I value your feedback and entrusting us with your care. If you get a [facility] patient survey, I would appreciate you taking the time to let us know about your experience today. Thank you! 

## 2018-09-02 NOTE — Progress Notes (Signed)
Glori Luis, MD   Chief Complaint  Patient presents with  . Metrorrhagia    pt says she bleeds about every other week, heavy flow, having pelvic and lower back pain x 2 yrs    HPI:      [name] is a 22 y.o. No obstetric history on file. who LMP was Patient's last menstrual period was 08/29/2018 (exact date)., presents today for NP evaluation of midcycle spotting for the past 2 years, referred by PCP Vickii Penna, MD). Menses used to be monthly, lasting 7-8 days without BTB. No dysmen. Pt then started having midcycle spotting about 2 yrs ago. Bleeding is light, usually dark brown and lasts 2-3 days mid cycle. She still has a monthly menses, lasting 5 days. Has dysmen and takes aleve with sx relief, as well as decreased appetite with current menses. Pt was on OCPs in past for irreg bleeding with good cycle control, but had n/v and headaches with the pills. Pt stopped pills and had normal menses for several months, but then irreg bleeding restarted. Pt amenable to trying different OCPs again.  Pt with cerebral palsy. Never sex active. Doesn't use tampons. Very sweet young lady. No hx of HTN/DVTs/seizures.   Past Medical History:  Diagnosis Date  . Breakthrough bleeding   . Cerebral palsy (HCC)   . GAD (generalized anxiety disorder)   . Migraine headache     Past Surgical History:  Procedure Laterality Date  . LEG SURGERY     bilateral x2 in 2008    Family History  Problem Relation Age of Onset  . Stroke Unknown        grandparent  . Hypertension Unknown        parent  . Mental illness Unknown        parent  . Diabetes Unknown        parent, grandparent    Social History   Socioeconomic History  . Marital status: Single    Spouse name: Not on file  . Number of children: Not on file  . Years of education: Not on file  . Highest education level: Not on file  Occupational History  . Not on file  Social Needs  . Financial resource strain: Not on file    . Food insecurity:    Worry: Not on file    Inability: Not on file  . Transportation needs:    Medical: Not on file    Non-medical: Not on file  Tobacco Use  . Smoking status: Never Smoker  . Smokeless tobacco: Never Used  Substance and Sexual Activity  . Alcohol use: No    Alcohol/week: 0.0 standard drinks  . Drug use: No  . Sexual activity: Not on file  Lifestyle  . Physical activity:    Days per week: Not on file    Minutes per session: Not on file  . Stress: Not on file  Relationships  . Social connections:    Talks on phone: Not on file    Gets together: Not on file    Attends religious service: Not on file    Active member of club or organization: Not on file    Attends meetings of clubs or organizations: Not on file    Relationship status: Not on file  . Intimate partner violence:    Fear of current or ex partner: Not on file    Emotionally abused: Not on file    Physically abused: Not on file  Forced sexual activity: Not on file  Other Topics Concern  . Not on file  Social History Narrative  . Not on file    Outpatient Medications Prior to Visit  Medication Sig Dispense Refill  . baclofen (LIORESAL) 10 MG tablet Take 10 mg by mouth.    . escitalopram (LEXAPRO) 5 MG tablet Take 5 mg by mouth daily.    . Multiple Vitamins-Minerals (CENTRUM SILVER ULTRA WOMENS PO) Take by mouth.    . ondansetron (ZOFRAN) 4 MG tablet Take 4 mg by mouth.    . senna-docusate (SENOKOT-S) 8.6-50 MG tablet Take by mouth.    . Vitamin D, Ergocalciferol, (DRISDOL) 50000 units CAPS capsule Take 50,000 Units by mouth every 7 (seven) days.     No facility-administered medications prior to visit.     ROS:  Review of Systems  Constitutional: Negative for fatigue, fever and unexpected weight change.  Respiratory: Negative for cough, shortness of breath and wheezing.   Cardiovascular: Negative for chest pain, palpitations and leg swelling.  Gastrointestinal: Positive for nausea.  Negative for blood in stool, constipation, diarrhea and vomiting.  Endocrine: Negative for cold intolerance, heat intolerance and polyuria.  Genitourinary: Positive for menstrual problem. Negative for dyspareunia, dysuria, flank pain, frequency, genital sores, hematuria, pelvic pain, urgency, vaginal bleeding, vaginal discharge and vaginal pain.  Musculoskeletal: Positive for arthralgias. Negative for back pain, joint swelling and myalgias.  Skin: Negative for rash.  Neurological: Positive for headaches. Negative for dizziness, syncope, light-headedness and numbness.  Hematological: Negative for adenopathy.  Psychiatric/Behavioral: Negative for agitation, confusion, sleep disturbance and suicidal ideas. The patient is not nervous/anxious.   BREAST: No symptoms   OBJECTIVE:   Vitals:  BP 120/80   Pulse 98   LMP 08/29/2018 (Exact Date)   Physical Exam  Constitutional: She is oriented to person, place, and time.  Neck: Normal range of motion. No tracheal deviation present. No thyromegaly present.  Cardiovascular: Normal rate and regular rhythm.  Pulmonary/Chest: Effort normal and breath sounds normal.  Abdominal: Soft.  Neurological: She is alert and oriented to person, place, and time.  Psychiatric: Judgment normal.  Vitals reviewed. GYN EXAM ATTEMPTED BUT UNABLE TO PERFORM DUE TO DECREASED MOBILITY/NOT WANTING TO TRAUMATIZE PT  Assessment/Plan: Breakthrough bleeding - For 2 yrs. Sx resolved with OCP use in past. Will restart OCPs. Try Lo Loestrin. F/u prn n/v/headaches. If sx persist, will eval further with labs/GYN u/s.  - Plan: Norethindrone-Ethinyl Estradiol-Fe Biphas (LO LOESTRIN FE) 1 MG-10 MCG / 10 MCG tablet, DISCONTINUED: Norethindrone-Ethinyl Estradiol-Fe Biphas (LO LOESTRIN FE) 1 MG-10 MCG / 10 MCG tablet  Encounter for initial prescription of contraceptive pills - Lo Loestrin. Rx eRxd/2 samples given. F/u in 4 months with sx.  - Plan: Norethindrone-Ethinyl Estradiol-Fe  Biphas (LO LOESTRIN FE) 1 MG-10 MCG / 10 MCG tablet, DISCONTINUED: Norethindrone-Ethinyl Estradiol-Fe Biphas (LO LOESTRIN FE) 1 MG-10 MCG / 10 MCG tablet    Meds ordered this encounter  Medications  . DISCONTD: Norethindrone-Ethinyl Estradiol-Fe Biphas (LO LOESTRIN FE) 1 MG-10 MCG / 10 MCG tablet    Sig: Take 1 tablet by mouth daily.    Dispense:  84 tablet    Refill:  3    Order Specific Question:   Supervising Provider    Answer:   Nadara Mustard [id number]  . Norethindrone-Ethinyl Estradiol-Fe Biphas (LO LOESTRIN FE) 1 MG-10 MCG / 10 MCG tablet    Sig: Take 1 tablet by mouth daily.    Dispense:  84 tablet    Refill:  3    Order Specific Question:   Supervising Provider    Answer:   Nadara MustardHARRIS, ROBERT P [[id number]]      Return in about 4 months (around 01/01/2019) for BTB f/u.  Linsey Hirota B. Javin Nong, PA-C 09/02/2018 3:05 PM

## 2018-10-07 ENCOUNTER — Other Ambulatory Visit: Payer: Self-pay

## 2018-10-07 ENCOUNTER — Ambulatory Visit: Payer: Medicaid Other | Attending: Physical Medicine and Rehabilitation | Admitting: Physical Therapy

## 2018-10-07 ENCOUNTER — Encounter: Payer: Self-pay | Admitting: Physical Therapy

## 2018-10-07 DIAGNOSIS — M6281 Muscle weakness (generalized): Secondary | ICD-10-CM | POA: Insufficient documentation

## 2018-10-07 DIAGNOSIS — G809 Cerebral palsy, unspecified: Secondary | ICD-10-CM | POA: Insufficient documentation

## 2018-10-07 DIAGNOSIS — R2681 Unsteadiness on feet: Secondary | ICD-10-CM | POA: Insufficient documentation

## 2018-10-07 NOTE — Therapy (Addendum)
[facility] [facility] [address] [place], [place], [address] Phone: [phone]   Fax:  [phone]  Physical Therapy Evaluation  Patient Details  Name: Renee BaldFatima Walton MRN: [id number] Date of Birth: December 01, 1995 Referring Provider (PT): [place], [place]   Encounter Date: 10/07/2018  PT End of Session - 10/07/18 0854    Visit Number  1    Number of Visits  8   Date for PT Re-Evaluation  12/03/18   Authorization Type  medicaid    PT Start Time  0440    PT Stop Time  0530    PT Time Calculation (min)  50 min    Equipment Utilized During Treatment  Gait belt    Activity Tolerance  Patient tolerated treatment well;No increased pain    Behavior During Therapy  WFL for tasks assessed/performed       Past Medical History:  Diagnosis Date  . Breakthrough bleeding   . Cerebral palsy (HCC)   . GAD (generalized anxiety disorder)   . Migraine headache     Past Surgical History:  Procedure Laterality Date  . LEG SURGERY     bilateral x2 in 2008    There were no vitals filed for this visit.   Subjective Assessment - 10/08/18 0852    Subjective  Patient reprots doing her exercises with her Moms help. She is using a power wc for indoor mobility and is walking on a TM for exercise. She wants to try the walk aide to see if it would improve her ambulation    Patient is accompained by:  Family member    Pertinent History  pertinent factors affecting rehab: cerebral palsy, high fall risk; good family support; Patient is walking at home with her RW.     Limitations  Standing;Walking    How long can you stand comfortably?  5-15 min    How long can you walk comfortably?  30 min;     Diagnostic tests  none recent;     Patient Stated Goals  be more independent, walk without person assist,     Pain Onset  More than a month ago         [facility] - 10/07/18 1646      Assessment   Medical Diagnosis  CP    Referring  Provider (PT)  [place], [facility]    Onset Date/Surgical Date  09/10/18    Hand Dominance  Left    Next MD Visit  2/20    Prior Therapy  --   Patient had therapy 4/19 for the same reason     Precautions   Precautions  None      Restrictions   Weight Bearing Restrictions  No      Balance Screen   Has the patient fallen in the past 6 months  No    Has the patient had a decrease in activity level because of a fear of falling?   No    Is the patient reluctant to leave their home because of a fear of falling?   No      Home Environment   Living Environment  Private residence    Available Help at Discharge  Family    Type of Home  House    Home Access  Level entry    Home Layout  Two level    Alternate Level Stairs-Number of Steps  12    Alternate Level Stairs-Rails  Right  Home Equipment  Walker - 2 wheels    Additional Comments  Mom helps with bathing and dressing, shoes      Prior Function   Level of Independence  Independent with household mobility with device    Leisure  she uses the powerchair in the house and she uses the RW if she goes out into the community      Cognition   Overall Cognitive Status  History of cognitive impairments - at baseline        PAIN: No reports of pain  POSTURE: Sitting:  with knees extending due to tone, adductor tone noted,  Standing: narrowed BOS    PROM/AROM: WFL BUE and BLE  STRENGTH:  Graded on a 0-5 scale Muscle Group Left Right                          Hip Flex 3+/5 3+/5  Hip Abd 3/5 3/5  Hip Add 2/5 2/5          Knee Flex 2/5 2/5  Knee Ext 3/5 3/5  Ankle DF 3/5 4/5       SENSATION: No numbness or tingling UE or LE , Denies numbness/tingling B LE   FUNCTIONAL MOBILITY: MI with all transfers and bed mobility  BALANCE: Static Standing Balance  Normal Able to maintain standing balance against maximal resistance   Good Able to maintain standing balance against moderate resistance   Good-/Fair+ Able to  maintain standing balance against minimal resistance   Fair Able to stand unsupported without UE support and without LOB for 1-2 min   Fair- Requires Min A and UE support to maintain standing without loss of balance x  Poor+ Requires mod A and UE support to maintain standing without loss of balance   Poor Requires max A and UE support to maintain standing balance without loss    Standing Dynamic Balance  Normal Stand independently unsupported, able to weight shift and cross midline maximally   Good Stand independently unsupported, able to weight shift and cross midline moderately   Good-/Fair+ Stand independently unsupported, able to weight shift across midline minimally   Fair Stand independently unsupported, weight shift, and reach ipsilaterally, loss of balance when crossing midline   Poor+ Able to stand with Min A and reach ipsilaterally, unable to weight shift   Poor Able to stand with Mod A and minimally reach ipsilaterally, unable to cross midline. x   Tone/Spasticity:  Increased tone in R hip adductors > L hip adductors Increased quad spasticity B (R>L)    GAIT:   Patient has variable foot placement with decreased step height and.decreased heel strike; increased trunk sway/ lurch with gait, narrow base of support, anterior pelvic tilt with RW.   OUTCOME MEASURES: TEST Outcome Interpretation  5 times sit<>stand .28sec >62 yo, >15 sec indicates increased risk for falls  10 meter walk test      .68           m/s <1.0 m/s indicates increased risk for falls; limited community ambulator                                                PT Education - 10/07/18 1654    Education provided  Yes    Education Details  plan of care    Person(s) Educated  Patient    Methods  Explanation    Comprehension  Verbalized understanding                              PT Long Term Goals - 10/08/18 1622      PT LONG TERM GOAL #1   Title  Patient will  increase 10 meter walk test to >1.83m/s as to improve gait speed for better community ambulation and to reduce fall risk.    Baseline  .68 m/sec    Time  8    Period  Weeks    Status  New    Target Date  12/03/18      PT LONG TERM GOAL #2   Title  Patient will be modified independent in walking on TM surface with walk aide for 20+ minutes without rest break, to improve walking tolerance and safety.     Time  8    Period  Weeks    Status  New    Target Date  12/03/18      PT LONG TERM GOAL #3   Title  Patient (< 56 years old) will complete five times sit to stand test in < 10 seconds indicating an increased LE strength and improved balance.    Baseline  . 28 sec    Time  8    Period  Weeks    Status  New    Target Date  12/03/18             Plan - 10/08/18 0857    Clinical Impression Statement  21 yo Female diagnosed with cerebral palsy, presents to therapy with BLE  weakness and decreased mobility. Patient reports walking with RW, using a power chair for indoor use, and requiring assistance from family for most ADLs. Patient demonstrates increased BLE extensor tone and decreased coordination and motor control of LE's. She reports that she has been stretching regularly and doing her HEP. She reports no  falls in the last 6 months.. Patients HEP was reviewed and recommended that she conintue her HEP and she come in for a trial of the walk aide to evaluate if it improves her gait.     Rehab Potential  Good    Clinical Impairments Affecting Rehab Potential  negative: lack of compliance with stretches and HEP    PT Frequency  One time visit    PT Treatment/Interventions  Patient/family education;Manual techniques;Therapeutic activities;Therapeutic exercise;Electrical Stimulation;Aquatic Therapy;DME Instruction;Functional mobility training;Gait training;Neuromuscular re-education;ADLs/Self Care Home Management    PT Next Visit Plan  walk aide with gait     PT Home Exercise Plan   stretching hamstring, add, gastroc    Consulted and Agree with Plan of Care  Patient;Family member/caregiver    Family Member Consulted  mom       Patient will benefit from skilled therapeutic intervention in order to improve the following deficits and impairments:  Abnormal gait, Decreased safety awareness, Decreased balance  Visit Diagnosis: Muscle weakness (generalized) - Plan: PT plan of care cert/re-cert  Cerebral palsy, unspecified type (HCC) - Plan: PT plan of care cert/re-cert  Unsteadiness on feet - Plan: PT plan of care cert/re-cert     Problem List Patient Active Problem List   Diagnosis Date Noted  . Loss of weight 07/17/2015  . Depression 07/17/2015  . Cerebral palsy (HCC) 07/17/2015    Ezekiel Ina , PT DPT 10/08/2018, 4:59 PM  [facility] MAIN [facility] SERVICES [address] [place], [place], [address] Phone: [phone]   Fax:  [phone]  Name: Renee Walton MRN: [id number] Date of Birth: December 10, 1995

## 2018-10-08 NOTE — Addendum Note (Signed)
Addended by: Ezekiel InaMANSFIELD, Yasir Kitner S on: 10/08/2018 05:00 PM   Modules accepted: Orders

## 2018-10-13 ENCOUNTER — Ambulatory Visit: Payer: Self-pay | Admitting: Physical Therapy

## 2018-10-20 ENCOUNTER — Ambulatory Visit: Payer: Medicaid Other | Admitting: Physical Therapy

## 2018-10-22 ENCOUNTER — Ambulatory Visit: Payer: Medicaid Other | Admitting: Physical Therapy

## 2018-10-26 ENCOUNTER — Ambulatory Visit: Payer: Medicaid Other

## 2018-10-27 ENCOUNTER — Ambulatory Visit: Payer: Self-pay | Admitting: Physical Therapy

## 2018-10-29 ENCOUNTER — Ambulatory Visit: Payer: Medicaid Other | Admitting: Physical Therapy

## 2018-10-30 ENCOUNTER — Ambulatory Visit: Payer: Medicaid Other

## 2018-11-03 ENCOUNTER — Ambulatory Visit: Payer: Medicaid Other | Admitting: Physical Therapy

## 2018-11-03 ENCOUNTER — Ambulatory Visit: Payer: Self-pay

## 2018-11-04 ENCOUNTER — Ambulatory Visit: Payer: Medicaid Other | Attending: Physical Medicine and Rehabilitation | Admitting: Physical Therapy

## 2018-11-04 DIAGNOSIS — G809 Cerebral palsy, unspecified: Secondary | ICD-10-CM | POA: Insufficient documentation

## 2018-11-04 DIAGNOSIS — R2681 Unsteadiness on feet: Secondary | ICD-10-CM | POA: Insufficient documentation

## 2018-11-04 DIAGNOSIS — M6281 Muscle weakness (generalized): Secondary | ICD-10-CM | POA: Insufficient documentation

## 2018-11-05 ENCOUNTER — Ambulatory Visit
Admission: RE | Admit: 2018-11-05 | Discharge: 2018-11-05 | Disposition: A | Discharge status: Home / Self Care | Payer: Medicaid Other | Source: Ambulatory Visit | Attending: Physical Medicine and Rehabilitation | Admitting: Physical Medicine and Rehabilitation

## 2018-11-05 ENCOUNTER — Other Ambulatory Visit: Payer: Self-pay | Admitting: Physical Medicine and Rehabilitation

## 2018-11-05 ENCOUNTER — Ambulatory Visit: Payer: Medicaid Other | Admitting: Physical Therapy

## 2018-11-05 DIAGNOSIS — G801 Spastic diplegic cerebral palsy: Secondary | ICD-10-CM | POA: Insufficient documentation

## 2018-11-09 ENCOUNTER — Ambulatory Visit: Payer: Self-pay

## 2018-11-10 ENCOUNTER — Ambulatory Visit: Payer: Medicaid Other | Admitting: Physical Therapy

## 2018-11-11 ENCOUNTER — Encounter: Payer: Self-pay | Admitting: Physical Therapy

## 2018-11-11 ENCOUNTER — Ambulatory Visit: Payer: Medicaid Other | Admitting: Physical Therapy

## 2018-11-11 DIAGNOSIS — R2681 Unsteadiness on feet: Secondary | ICD-10-CM

## 2018-11-11 DIAGNOSIS — G809 Cerebral palsy, unspecified: Secondary | ICD-10-CM | POA: Diagnosis present

## 2018-11-11 DIAGNOSIS — M6281 Muscle weakness (generalized): Secondary | ICD-10-CM | POA: Diagnosis not present

## 2018-11-12 ENCOUNTER — Ambulatory Visit: Payer: Medicaid Other | Admitting: Physical Therapy

## 2018-11-12 ENCOUNTER — Ambulatory Visit: Payer: Self-pay

## 2018-11-12 NOTE — Therapy (Signed)
[place] MAIN [facility] SERVICES [address] [place], [place], [address] Phone: [phone]   Fax:  [phone]  Physical Therapy Treatment  Patient Details  Name: Renee Walton MRN: [id number] Date of Birth: Mar 05, 1996 Referring Provider (PT): [place], [place]   Encounter Date: 11/11/2018  PT End of Session - 11/11/18 1500    Visit Number  2    Number of Visits  8    Date for PT Re-Evaluation  12/03/18    Authorization Type  medicaid approved 3 visits from 10/26/18 to 11/15/18    Authorization - Visit Number  1    Authorization - Number of Visits  3    PT Start Time  1500    PT Stop Time  1545    PT Time Calculation (min)  45 min    Equipment Utilized During Treatment  Gait belt    Activity Tolerance  Patient tolerated treatment well;No increased pain    Behavior During Therapy  WFL for tasks assessed/performed       Past Medical History:  Diagnosis Date  . Breakthrough bleeding   . Cerebral palsy ([place])   . GAD (generalized anxiety disorder)   . Migraine headache     Past Surgical History:  Procedure Laterality Date  . LEG SURGERY     bilateral x2 in 2008    There were no vitals filed for this visit.  Subjective Assessment - 11/11/18 1505    Subjective  Patient reports doing well; She states, "I want to try the walkaide to see if it will help my walking." Denies any falls lately; presents to therapy without AD; She still uses wheelchair and walker intermittently;     Patient is accompained by:  Family member    Pertinent History  pertinent factors affecting rehab: cerebral palsy, high fall risk; good family support; Patient is walking at home with her RW.     Limitations  Standing;Walking    How long can you stand comfortably?  5-15 min    How long can you walk comfortably?  30 min;     Diagnostic tests  none recent;     Patient Stated Goals  be more independent, walk without person assist,     Currently in Pain?   No/denies    Multiple Pain Sites  No         [facility] PT Assessment - 11/12/18 0001      Standardized Balance Assessment   10 Meter Walk  1.0 m/s with walkaide on RLE, community ambulator, low fall risk; improved from 10/07/18 which was 0.6 m/s without walkaide and with RW            TREATMENT: PT fit patient for walkaide on RLE as RLE is weaker than LLE. She was able to exhibit good twitch response with improved ankle DF during walkaide activation;  PT fit walkaide for optimum foot clearance during ambulation based on patient gait cycle; this was done on treadmill, 1.2 mph with 2 HHA x3 min; Patient then walked around room with walkaide, on intensity #2; however patient had inconsistent walkaide activation as she has difficulty achieving knee flexion/extension at correct sequence of gait cycle, often exhibiting knee flexion in mid stance and decreased knee extension at initial contact;  PT fit patient for walkaide with heel switch so that walkaide would come on with unweighting and then turn off after weight bearing sensed; This was still fit on right leg; Patient able to exhibit better walkaide activation  with heel switch with improved foot clearance on RLE; She ambulated on treadmill 1.2-1.4 mph with 2 HHA x5-7 min without rest break, exhibiting less foot drag, increased step length and less foot slap on RLE due to walkaide activation; She tolerated walkaide well with good skin integrity and no pain reported;  Patient's speed does seem to improve with walkaide activation as compared to without; this is evidenced above: 10 meter walk speed: 1.0 m/s with walkaide, on 11/11/18: however on 10/07/18 was 0.68 without walkaide;   Tolerated walkaide education well; Caregiver verbalized understanding; She does exhibit good tolerance without skin issues/breakdown and with no pain reported at end of session; Patient motivated and excited about possibility of getting walkaide for improved physical  performance and to improve independence; She is definitely safer with gait on treadmill using walkaide as compared to without;                PT Education - 11/12/18 1120    Education provided  Yes    Education Details  walkaide, recommendations    Person(s) Educated  Patient    Methods  Explanation;Verbal cues    Comprehension  Verbalized understanding;Returned demonstration;Verbal cues required;Need further instruction          PT Long Term Goals - 11/12/18 1351      PT LONG TERM GOAL #1   Title  Patient will increase 10 meter walk test to >1.84ms as to improve gait speed for better community ambulation and to reduce fall risk.    Baseline  .68 m/sec, 11/11/18: 1.0 m/s    Time  8    Period  Weeks    Status  Partially Met    Target Date  12/03/18      PT LONG TERM GOAL #2   Title  Patient will be modified independent in walking on TM surface with walk aide for 20+ minutes without rest break, to improve walking tolerance and safety.     Baseline  able to walk at 1.2-1.4 mph with 2 HHA x5+ min today;     Time  8    Period  Weeks    Status  Partially Met    Target Date  12/03/18      PT LONG TERM GOAL #3   Title  Patient (< 642years old) will complete five times sit to stand test in < 10 seconds indicating an increased LE strength and improved balance.    Baseline  . 28 sec    Time  8    Period  Weeks    Status  Partially Met    Target Date  12/03/18            Plan - 11/12/18 1349    Clinical Impression Statement  Patient is progressing well. She is able to exhibit better foot clearance with walkaide on RLE with improved gait mechanics including improved gait speed. She had difficulty achieving good knee control during ambulation requiring heel switch for optimum walkaide activation; Patient does test as a lower fall risk with walkaide use as compared to without walkaide use. She would benefit from additional skilled PT intervention to improve gait safety  and to further assess walkaide effectiveness; Clinically she does seem to do better with walkaide as compared to without and therefore would potentially benefit from home walkaide use. Would need additional skilled intervention on safe walkaide use for best adherence and safety;     Rehab Potential  Good    Clinical Impairments Affecting  Rehab Potential  negative: lack of compliance with stretches and HEP    PT Frequency  1x / week    PT Duration  8 weeks    PT Treatment/Interventions  Patient/family education;Manual techniques;Therapeutic activities;Therapeutic exercise;Electrical Stimulation;Aquatic Therapy;DME Instruction;Functional mobility training;Gait training;Neuromuscular re-education;ADLs/Self Care Home Management    PT Next Visit Plan  walk aide with gait     PT Home Exercise Plan  stretching hamstring, add, gastroc    Consulted and Agree with Plan of Care  Patient;Family member/caregiver    Family Member Consulted  mom       Patient will benefit from skilled therapeutic intervention in order to improve the following deficits and impairments:  Abnormal gait, Decreased safety awareness, Decreased balance  Visit Diagnosis: Muscle weakness (generalized)  Unsteadiness on feet  Cerebral palsy, unspecified type [facility])     Problem List Patient Active Problem List   Diagnosis Date Noted  . Loss of weight 07/17/2015  . Depression 07/17/2015  . Cerebral palsy ([place]) 07/17/2015    Ximena Todaro PT, DPT 11/12/2018, 1:52 PM   [facility] MAIN [facility] SERVICES [address] [place], [place], [address] Phone: [phone]   Fax:  [phone]  Name: Chalsea Darko MRN: [id number] Date of Birth: 1996-04-17

## 2018-11-17 ENCOUNTER — Ambulatory Visit: Payer: Medicaid Other | Admitting: Physical Therapy

## 2018-11-19 ENCOUNTER — Ambulatory Visit: Payer: Medicaid Other | Admitting: Physical Therapy

## 2018-11-19 ENCOUNTER — Ambulatory Visit: Payer: Self-pay

## 2018-11-23 ENCOUNTER — Ambulatory Visit: Payer: Self-pay

## 2018-11-24 ENCOUNTER — Ambulatory Visit: Payer: Medicaid Other | Admitting: Physical Therapy

## 2018-11-26 ENCOUNTER — Ambulatory Visit: Payer: Medicaid Other | Attending: Physical Medicine and Rehabilitation | Admitting: Physical Therapy

## 2018-11-26 ENCOUNTER — Ambulatory Visit: Payer: Medicaid Other | Admitting: Physical Therapy

## 2018-11-26 DIAGNOSIS — M6281 Muscle weakness (generalized): Secondary | ICD-10-CM | POA: Insufficient documentation

## 2018-11-26 DIAGNOSIS — G809 Cerebral palsy, unspecified: Secondary | ICD-10-CM | POA: Insufficient documentation

## 2018-11-26 DIAGNOSIS — R2681 Unsteadiness on feet: Secondary | ICD-10-CM | POA: Insufficient documentation

## 2018-12-03 ENCOUNTER — Ambulatory Visit: Payer: Medicaid Other | Admitting: Physical Therapy

## 2018-12-10 ENCOUNTER — Ambulatory Visit: Payer: Medicaid Other

## 2018-12-10 DIAGNOSIS — R2681 Unsteadiness on feet: Secondary | ICD-10-CM

## 2018-12-10 DIAGNOSIS — M6281 Muscle weakness (generalized): Secondary | ICD-10-CM | POA: Diagnosis not present

## 2018-12-10 DIAGNOSIS — G809 Cerebral palsy, unspecified: Secondary | ICD-10-CM | POA: Diagnosis present

## 2018-12-10 NOTE — Therapy (Signed)
[place] [facility] MAIN [facility] SERVICES [address] [place], [place], [address] Phone: [phone]   Fax:  [phone]  Physical Therapy Treatment  Patient Details  Name: Renee Walton MRN: [id number] Date of Birth: 1996-03-14 Referring Provider (PT): [place], [place]   Encounter Date: 12/10/2018    Past Medical History:  Diagnosis Date  . Breakthrough bleeding   . Cerebral palsy ([facility])   . GAD (generalized anxiety disorder)   . Migraine headache     Past Surgical History:  Procedure Laterality Date  . LEG SURGERY     bilateral x2 in 2008    There were no vitals filed for this visit.    12/10/18 1450  Symptoms/Limitations  Subjective Patient with no complaints at start of session. Very motivated to use a walkaide.   Patient is accompained by: Family member  Pertinent History pertinent factors affecting rehab: cerebral palsy, high fall risk; good family support; Patient is walking at home with her RW.   Limitations Standing;Walking  How long can you stand comfortably? 5-15 min  How long can you walk comfortably? 30 min;   Diagnostic tests none recent;   Patient Stated Goals be more independent, walk without person assist,   Pain Assessment  Currently in Pain? No/denies       12/10/18 1400  PT Education  Education provided Yes  Education Details PT role, POC, HEP, compliance  Person(s) Educated Patient;Parent(s)  Methods Demonstration;Verbal cues;Handout;Explanation  Comprehension Verbalized understanding;Returned demonstration;Verbal cues required   TREATMENT: PT and pt addressed HEP, importance of compliance, plan with walkaide next session. PT and patient also discussed pt's difficulty with transfers in/out of bathtub, LE dressing, 2-4 close calls of falls, and difficulty with uneven surfaces.   TM 1.0 MPH -1.2 MPH for 1 minute, with fatigue patient demonstrated increased R foot drag.   5 times sit to stand: 14.99 with  minAx1 to maintain balance 20.48 second attempt with cues for address posterior LOB, patient still uses posterior knees to stabilize against chair  in // bars: Standing marching with CGA, no UE support alternating legs for limb dissociation and reciprocal patterning. Occasional minAx1 from PT to correct LOB Standing FT EO 3x30seconds with CGA, cues for upright posture Standing one foot forward alternating feet forward 3x30secs CGA-minAx1 with cues for upright posture and equal weight bearing through both LEs.      PT Short Term Goals - 12/10/18 1447      PT SHORT TERM GOAL #1   Title  Patient and family members will be indpendent with HEP and understand they need to do the HEP at home.    Baseline  goes to gy [facility] other day, uses bike, TM and stretches    Status  Achieved        PT Long Term Goals - 12/10/18 1456      PT LONG TERM GOAL #1   Title  Patient will increase 10 meter walk test to >1.30m/s as to improve gait speed for better community ambulation and to reduce fall risk.    Baseline  .68 m/sec, 11/11/18: 1.0 m/s ; 1.04 m/s    Time  6    Period  Weeks    Status  On-going    Target Date  01/21/19      PT LONG TERM GOAL #2   Title  Patient will be modified independent in walking on TM surface with walk aide for 20+ minutes without rest break, to improve walking tolerance and safety.  Baseline  able to walk at 1.2-1.4 mph with 2 HHA x5+ min today;     Time  6    Period  Weeks    Status  Deferred    Target Date  01/21/19      PT LONG TERM GOAL #3   Title  Patient (< 18 years old) will complete five times sit to stand test in < 10 seconds indicating an increased LE strength and improved balance.    Baseline  . 28 sec, 20.48 seconds with intermittent LOB posteriorily/stabilization again chair with knees 12/10/2018    Time  6    Period  Weeks    Target Date  01/21/19      PT LONG TERM GOAL #4   Title  Patient will report decreased incidences of falls and stumbles to  less than 1 a month to improve safety at home.     Baseline  2-4 instances of "close calls" has to grab mother to stabilize    Time  6    Period  Weeks    Status  New    Target Date  01/23/19         12/10/18 1513  Plan  Clinical Impression Statement Patient reassessed this session. Patient continued to demonstrate mild progress with functional activities, but still demonstrated limitations in gait mechanics, balance, activity tolerance, and strength. The patient would benefit from continued skilled PT to address limitations, maximize gait mechanics, safety, and mobility, and to decrease risk of falls. The patient would also benefit from further assessment and use of walkaide to optimize gait safety, and assess response for potential home walkaide use.   Pt will benefit from skilled therapeutic intervention in order to improve on the following deficits Abnormal gait;Decreased safety awareness;Decreased balance;Dizziness  Rehab Potential Good  Clinical Impairments Affecting Rehab Potential negative: lack of compliance with stretches and HEP  PT Frequency 1x / week  PT Duration 6 weeks  PT Treatment/Interventions Patient/family education;Manual techniques;Therapeutic activities;Therapeutic exercise;Electrical Stimulation;Aquatic Therapy;DME Instruction;Functional mobility training;Gait training;Neuromuscular re-education;ADLs/Self Care Home Management  PT Next Visit Plan walk aide with gait   PT Home Exercise Plan stretching hamstring, add, gastroc, sit to stands  Consulted and Agree with Plan of Care Patient  Family Member Consulted mom        Patient will benefit from skilled therapeutic intervention in order to improve the following deficits and impairments:  Abnormal gait, Decreased safety awareness, Decreased balance, Dizziness    Visit Diagnosis: Muscle weakness (generalized)  Unsteadiness on feet  Cerebral palsy, unspecified type [facility])     Problem List Patient Active  Problem List   Diagnosis Date Noted  . Loss of weight 07/17/2015  . Depression 07/17/2015  . Cerebral palsy ([facility]) 07/17/2015    Olga Coaster PT, DPT 9:02 AM,12/12/18 [phone]  [facility] MAIN [facility] SERVICES [address] [place], [place], [address] Phone: [phone]   Fax:  [phone]  Name: Renee Walton MRN: [id number] Date of Birth: [birth year]

## 2018-12-16 ENCOUNTER — Ambulatory Visit: Payer: Medicaid Other

## 2018-12-16 ENCOUNTER — Encounter: Payer: Self-pay | Admitting: Physical Therapy

## 2018-12-16 DIAGNOSIS — G809 Cerebral palsy, unspecified: Secondary | ICD-10-CM

## 2018-12-16 DIAGNOSIS — M6281 Muscle weakness (generalized): Secondary | ICD-10-CM

## 2018-12-16 DIAGNOSIS — R2681 Unsteadiness on feet: Secondary | ICD-10-CM

## 2018-12-16 NOTE — Therapy (Signed)
[facility] MAIN [facility] SERVICES [address] [place], [place], [address] Phone: [phone]   Fax:  [phone]  Physical Therapy Treatment  Patient Details  Name: Pamale Mumford MRN: [id number] Date of Birth: 04/07/1996 Referring Provider (PT): [place], [place]   Encounter Date: 12/16/2018  PT End of Session - 12/16/18 1532    Visit Number  4    Number of Visits  8    Date for PT Re-Evaluation  01/21/19    Authorization Type  medicaid approved 8 visits until 3/25    Authorization - Visit Number  2    Authorization - Number of Visits  8    PT Start Time  1055    PT Stop Time  1145    PT Time Calculation (min)  50 min    Equipment Utilized During Treatment  Gait belt    Activity Tolerance  Patient tolerated treatment well;No increased pain    Behavior During Therapy  WFL for tasks assessed/performed       Past Medical History:  Diagnosis Date  . Breakthrough bleeding   . Cerebral palsy ([facility])   . GAD (generalized anxiety disorder)   . Migraine headache     Past Surgical History:  Procedure Laterality Date  . LEG SURGERY     bilateral x2 in 2008    There were no vitals filed for this visit.  Subjective Assessment - 12/16/18 1531    Subjective  Patient eager to work on [employer], no complaints at this time.    Patient is accompained by:  Family member    Pertinent History  pertinent factors affecting rehab: cerebral palsy, high fall risk; good family support; Patient is walking at home with her RW.     Limitations  Standing;Walking    How long can you stand comfortably?  5-15 min    How long can you walk comfortably?  30 min;     Diagnostic tests  none recent;     Currently in Pain?  No/denies    Pain Onset  More than a month ago      TREATMENT:  Neuro-muscular re-education: Partial sit to stands from elevated plinth with PT block of R knee to prevent hyperextension 2 x10 Sit to stands from elevated plinth   With R knee block 2x10 (with piece of wood under both heels). Sit to stands from elevated plinth with reaching forward to place ball in bin, R knee block and facilitation at hips to increase lumbar lordosis 15x front, and lateral directions x5 ea Sit to stands with reaching ball overhead with R knee block and facilitation at hips to improve extension Supine abdominal crunch with support at feet and bolster + pillow for abdominal strengthening, cues for eccentric control  Gait training:  Gait training with loftstrand crutches, bilateral, and unilateral, verbal/tactile, visual cues, demonstration. Pt with significant difficulty with coordinating proper use, mild improvement with time and significant cueing      PT Education - 12/16/18 1049    Education provided  Yes    Education Details  balance, strategies, Exercise technique, loftstrand crutch use    Person(s) Educated  Patient    Methods  Demonstration;Verbal cues;Handout    Comprehension  Verbalized understanding;Returned demonstration;Verbal cues required       PT Short Term Goals - 12/10/18 1447      PT SHORT TERM GOAL #1   Title  Patient and family members will be indpendent with HEP and understand they need  to do the HEP at home.    Baseline  goes to gy [facility] other day, uses bike, TM and stretches    Status  Achieved        PT Long Term Goals - 12/10/18 1456      PT LONG TERM GOAL #1   Title  Patient will increase 10 meter walk test to >1.49m/s as to improve gait speed for better community ambulation and to reduce fall risk.    Baseline  .68 m/sec, 11/11/18: 1.0 m/s ; 1.04 m/s    Time  6    Period  Weeks    Status  On-going    Target Date  01/21/19      PT LONG TERM GOAL #2   Title  Patient will be modified independent in walking on TM surface with walk aide for 20+ minutes without rest break, to improve walking tolerance and safety.     Baseline  able to walk at 1.2-1.4 mph with 2 HHA x5+ min today;     Time  6     Period  Weeks    Status  Deferred    Target Date  01/21/19      PT LONG TERM GOAL #3   Title  Patient (< 29 years old) will complete five times sit to stand test in < 10 seconds indicating an increased LE strength and improved balance.    Baseline  . 28 sec, 20.48 seconds with intermittent LOB posteriorily/stabilization again chair with knees 12/10/2018    Time  6    Period  Weeks    Target Date  01/21/19      PT LONG TERM GOAL #4   Title  Patient will report decreased incidences of falls and stumbles to less than 1 a month to improve safety at home.     Baseline  2-4 instances of "close calls" has to grab mother to stabilize    Time  6    Period  Weeks    Status  New    Target Date  01/23/19            Plan - 12/16/18 1550    Clinical Impression Statement  Patient with improved lumbar extension and R knee control with facilitation from PT. extensive time spent with loftstrand training to attempt to improve patient independence, currently ambulates with handheld assist from mother. Patient very fatigued at end of session, but in good spirits, and maintained excellent motivation throughout.     Rehab Potential  Good    Clinical Impairments Affecting Rehab Potential  negative: lack of compliance with stretches and HEP    PT Frequency  1x / week    PT Duration  6 weeks    PT Treatment/Interventions  Patient/family education;Manual techniques;Therapeutic activities;Therapeutic exercise;Electrical Stimulation;Aquatic Therapy;DME Instruction;Functional mobility training;Gait training;Neuromuscular re-education;ADLs/Self Care Home Management    PT Next Visit Plan  walk aide with gait     PT Home Exercise Plan  stretching hamstring, add, gastroc, sit to stands    Consulted and Agree with Plan of Care  Patient       Patient will benefit from skilled therapeutic intervention in order to improve the following deficits and impairments:  Abnormal gait, Decreased safety awareness, Decreased  balance, Dizziness  Visit Diagnosis: Muscle weakness (generalized)  Unsteadiness on feet  Cerebral palsy, unspecified type [facility])     Problem List Patient Active Problem List   Diagnosis Date Noted  . Loss of weight 07/17/2015  . Depression 07/17/2015  .  Cerebral palsy ([facility]) 07/17/2015    Olga Coasteriana Kahner Yanik PT, DPT 3:53 PM,12/16/18 [phone]  [facility] [address] , [place], [address] Phone: [phone]   Fax:  [phone]  Name: Colonel BaldFatima Saal MRN: [id number] Date of Birth: [birth year]

## 2018-12-22 ENCOUNTER — Ambulatory Visit: Payer: Medicaid Other | Admitting: Physical Therapy

## 2018-12-30 ENCOUNTER — Other Ambulatory Visit: Payer: Self-pay

## 2018-12-30 ENCOUNTER — Encounter: Payer: Self-pay | Admitting: Physical Therapy

## 2018-12-30 ENCOUNTER — Ambulatory Visit: Payer: Medicaid Other | Attending: Family Medicine

## 2018-12-30 DIAGNOSIS — G809 Cerebral palsy, unspecified: Secondary | ICD-10-CM

## 2018-12-30 DIAGNOSIS — R2681 Unsteadiness on feet: Secondary | ICD-10-CM | POA: Diagnosis present

## 2018-12-30 DIAGNOSIS — M6281 Muscle weakness (generalized): Secondary | ICD-10-CM

## 2018-12-30 NOTE — Therapy (Signed)
[place] [facility] MAIN [facility] SERVICES [address] [place], [place], [address] Phone: [phone]   Fax:  [phone]  Physical Therapy Treatment  Patient Details  Name: Renee Walton MRN: [id number] Date of Birth: 09/23/1996 Referring Provider (PT): [place], [place]   Encounter Date: 12/30/2018  PT End of Session - 12/30/18 1543    Visit Number  5    Number of Visits  8    Date for PT Re-Evaluation  01/21/19    Authorization Type  medicaid approved 8 visits until 3/25    Authorization - Visit Number  3    Authorization - Number of Visits  8    PT Start Time  1455    PT Stop Time  1530    PT Time Calculation (min)  35 min    Equipment Utilized During Treatment  Gait belt    Activity Tolerance  Patient tolerated treatment well;No increased pain    Behavior During Therapy  WFL for tasks assessed/performed       Past Medical History:  Diagnosis Date  . Breakthrough bleeding   . Cerebral palsy (HCC)   . GAD (generalized anxiety disorder)   . Migraine headache     Past Surgical History:  Procedure Laterality Date  . LEG SURGERY     bilateral x2 in 2008    There were no vitals filed for this visit.  Subjective Assessment - 12/30/18 1540    Subjective  Patient reported that she is doing well today. Stated that she has not had any falls since her last PT visit.    Patient is accompained by:  Family member    Pertinent History  pertinent factors affecting rehab: cerebral palsy, high fall risk; good family support; Patient is walking at home with her RW.     Limitations  Standing;Walking    How long can you stand comfortably?  5-15 min    How long can you walk comfortably?  30 min;     Diagnostic tests  none recent;     Patient Stated Goals  be more independent, walk without person assist,     Currently in Pain?  No/denies       TREATMENT: Therapeutic exercises: Hamstring stretch b/l in supine 3x30sec ea, difficulty with  dissociating LE movement Sit to stands with 1.5 wand flexion overhead 2x10 with seated rest break inbetween sets Sit to stands with standing balance x10 with 10sec holds During sit to stands PT provided R knee block to inhibit knee hyperextension and excessive knee flexion, wood block placed under heels for half of sets. minA at pelvis to increase lumbar lordosis, postural corrections, and controlled stand to sits.   Gait training:  Gait training with walking sticks unilaterally (L) with minA from PT. Able to progress to verbal cues unilaterally. Bilateral walking sticks attempted, with minA on R from PT for stick placement, gait speed. Pt with significant difficulty with coordinating proper use, mild improvement with time and significant cueing     PT Education - 12/30/18 1542    Education provided  Yes    Education Details  posture corrections, walking sticks    Person(s) Educated  Patient    Methods  Explanation;Demonstration;Verbal cues    Comprehension  Verbalized understanding;Returned demonstration;Verbal cues required       PT Short Term Goals - 12/10/18 1447      PT SHORT TERM GOAL #1   Title  Patient and family members will be indpendent with HEP and  understand they need to do the HEP at home.    Baseline  goes to gy [facility] other day, uses bike, TM and stretches    Status  Achieved        PT Long Term Goals - 12/10/18 1456      PT LONG TERM GOAL #1   Title  Patient will increase 10 meter walk test to >1.67m/s as to improve gait speed for better community ambulation and to reduce fall risk.    Baseline  .68 m/sec, 11/11/18: 1.0 m/s ; 1.04 m/s    Time  6    Period  Weeks    Status  On-going    Target Date  01/21/19      PT LONG TERM GOAL #2   Title  Patient will be modified independent in walking on TM surface with walk aide for 20+ minutes without rest break, to improve walking tolerance and safety.     Baseline  able to walk at 1.2-1.4 mph with 2 HHA x5+ min today;      Time  6    Period  Weeks    Status  Deferred    Target Date  01/21/19      PT LONG TERM GOAL #3   Title  Patient (< 71 years old) will complete five times sit to stand test in < 10 seconds indicating an increased LE strength and improved balance.    Baseline  . 28 sec, 20.48 seconds with intermittent LOB posteriorily/stabilization again chair with knees 12/10/2018    Time  6    Period  Weeks    Target Date  01/21/19      PT LONG TERM GOAL #4   Title  Patient will report decreased incidences of falls and stumbles to less than 1 a month to improve safety at home.     Baseline  2-4 instances of "close calls" has to grab mother to stabilize    Time  6    Period  Weeks    Status  New    Target Date  01/23/19            Plan - 12/30/18 1543    Clinical Impression Statement  Patient with improved ability to ambulate with walking sticks compared to lofstrand crutches. Progressed from unilateral walking stick with constant tactile and minA from PT to bilateral walking stick usage with minA. Patient still with significant difficulty with coordinating these movements and use of AD but willing to continue to try. Patient with improved posture in standing with tactile feedback at pelvis and knee block of R knee. Overall the patient would benefit from further skilled PT to continue to progress towards goals.     Rehab Potential  Good    Clinical Impairments Affecting Rehab Potential  negative: lack of compliance with stretches and HEP    PT Frequency  1x / week    PT Duration  6 weeks    PT Treatment/Interventions  Patient/family education;Manual techniques;Therapeutic activities;Therapeutic exercise;Electrical Stimulation;Aquatic Therapy;DME Instruction;Functional mobility training;Gait training;Neuromuscular re-education;ADLs/Self Care Home Management    PT Next Visit Plan  walk aide with gait     PT Home Exercise Plan  stretching hamstring, add, gastroc, sit to stands    Consulted and  Agree with Plan of Care  Patient    Family Member Consulted  mom       Patient will benefit from skilled therapeutic intervention in order to improve the following deficits and impairments:  Abnormal gait, Decreased  safety awareness, Decreased balance, Dizziness  Visit Diagnosis: Muscle weakness (generalized)  Unsteadiness on feet  Cerebral palsy, unspecified type [facility])     Problem List Patient Active Problem List   Diagnosis Date Noted  . Loss of weight 07/17/2015  . Depression 07/17/2015  . Cerebral palsy (HCC) 07/17/2015   Olga Coaster PT, DPT 3:50 PM,12/30/18 [phone]  [facility] MAIN [facility] SERVICES [address] [place], [place], [address] Phone: [phone]   Fax:  [phone]  Name: Renee Walton MRN: [id number] Date of Birth: October 19, 1996

## 2019-01-04 ENCOUNTER — Ambulatory Visit: Payer: Medicaid Other | Admitting: Obstetrics and Gynecology

## 2019-01-04 NOTE — Progress Notes (Deleted)
Glori Luis, MD   No chief complaint on file.   HPI:      [name] is a 23 y.o. No obstetric history on file. who LMP was No LMP recorded., presents today for OCP f/u. Hx of irreg menses with midcycle bleeding. Started different OCPs 11/19 for cycle control.     Past Medical History:  Diagnosis Date  . Breakthrough bleeding   . Cerebral palsy (HCC)   . GAD (generalized anxiety disorder)   . Migraine headache     Past Surgical History:  Procedure Laterality Date  . LEG SURGERY     bilateral x2 in 2008    Family History  Problem Relation Age of Onset  . Stroke Unknown        grandparent  . Hypertension Unknown        parent  . Mental illness Unknown        parent  . Diabetes Unknown        parent, grandparent    Social History   Socioeconomic History  . Marital status: Single    Spouse name: Not on file  . Number of children: Not on file  . Years of education: Not on file  . Highest education level: Not on file  Occupational History  . Not on file  Social Needs  . Financial resource strain: Not on file  . Food insecurity:    Worry: Not on file    Inability: Not on file  . Transportation needs:    Medical: Not on file    Non-medical: Not on file  Tobacco Use  . Smoking status: Never Smoker  . Smokeless tobacco: Never Used  Substance and Sexual Activity  . Alcohol use: No    Alcohol/week: 0.0 standard drinks  . Drug use: No  . Sexual activity: Not on file  Lifestyle  . Physical activity:    Days per week: Not on file    Minutes per session: Not on file  . Stress: Not on file  Relationships  . Social connections:    Talks on phone: Not on file    Gets together: Not on file    Attends religious service: Not on file    Active member of club or organization: Not on file    Attends meetings of clubs or organizations: Not on file    Relationship status: Not on file  . Intimate partner violence:    Fear of current or ex partner:  Not on file    Emotionally abused: Not on file    Physically abused: Not on file    Forced sexual activity: Not on file  Other Topics Concern  . Not on file  Social History Narrative  . Not on file    Outpatient Medications Prior to Visit  Medication Sig Dispense Refill  . baclofen (LIORESAL) 10 MG tablet Take 10 mg by mouth.    . escitalopram (LEXAPRO) 5 MG tablet Take 5 mg by mouth daily.    . Multiple Vitamins-Minerals (CENTRUM SILVER ULTRA WOMENS PO) Take by mouth.    . Norethindrone-Ethinyl Estradiol-Fe Biphas (LO LOESTRIN FE) 1 MG-10 MCG / 10 MCG tablet Take 1 tablet by mouth daily. 84 tablet 3  . ondansetron (ZOFRAN) 4 MG tablet Take 4 mg by mouth.    . senna-docusate (SENOKOT-S) 8.6-50 MG tablet Take by mouth.    . Vitamin D, Ergocalciferol, (DRISDOL) 50000 units CAPS capsule Take 50,000 Units by mouth every 7 (seven) days.  No facility-administered medications prior to visit.       ROS:  Review of Systems BREAST: No symptoms   OBJECTIVE:   Vitals:  There were no vitals taken for this visit.  Physical Exam  Results: No results found for this or any previous visit (from the past 24 hour(s)).   Assessment/Plan: No diagnosis found.    No orders of the defined types were placed in this encounter.     No follow-ups on file.  Lisabeth Mian B. Jahlisa Rossitto, PA-C 01/04/2019 10:49 AM

## 2019-01-06 ENCOUNTER — Ambulatory Visit: Payer: Medicaid Other | Admitting: Obstetrics and Gynecology

## 2019-01-07 ENCOUNTER — Encounter: Payer: Self-pay | Admitting: Physical Therapy

## 2019-01-07 ENCOUNTER — Other Ambulatory Visit: Payer: Self-pay

## 2019-01-07 ENCOUNTER — Ambulatory Visit: Payer: Medicaid Other

## 2019-01-07 DIAGNOSIS — R2681 Unsteadiness on feet: Secondary | ICD-10-CM

## 2019-01-07 DIAGNOSIS — M6281 Muscle weakness (generalized): Secondary | ICD-10-CM

## 2019-01-07 DIAGNOSIS — G809 Cerebral palsy, unspecified: Secondary | ICD-10-CM

## 2019-01-07 NOTE — Therapy (Signed)
[facility] MAIN [facility] SERVICES [address] [place], [place], [address] Phone: [phone]   Fax:  [phone]  Physical Therapy Treatment  Patient Details  Name: Aleatha Graper MRN: [id number] Date of Birth: 1996/01/08 Referring Provider (PT): [place], [place]   Encounter Date: 01/07/2019  PT End of Session - 01/07/19 1246    Visit Number  6    Number of Visits  8    Date for PT Re-Evaluation  01/21/19    Authorization Type  medicaid approved 8 visits until 3/25    Authorization - Visit Number  4    Authorization - Number of Visits  8    PT Start Time  1155    PT Stop Time  1230    PT Time Calculation (min)  35 min    Equipment Utilized During Treatment  Gait belt    Activity Tolerance  Patient tolerated treatment well;No increased pain    Behavior During Therapy  WFL for tasks assessed/performed       Past Medical History:  Diagnosis Date  . Breakthrough bleeding   . Cerebral palsy (HCC)   . GAD (generalized anxiety disorder)   . Migraine headache     Past Surgical History:  Procedure Laterality Date  . LEG SURGERY     bilateral x2 in 2008    There were no vitals filed for this visit.  Subjective Assessment - 01/07/19 1241    Subjective  Patient stated she is doing well today. No complaints at this time.    Patient is accompained by:  Family member    Pertinent History  pertinent factors affecting rehab: cerebral palsy, high fall risk; good family support; Patient is walking at home with her RW.     Limitations  Standing;Walking    How long can you stand comfortably?  5-15 min    How long can you walk comfortably?  30 min;     Diagnostic tests  none recent;     Patient Stated Goals  be more independent, walk without person assist,     Currently in Pain?  No/denies       TREATMENT: Therapeutic exercises: Hamstring stretch b/l in supine 3x30sec ea, difficulty with dissociating LE movement Sit to stands with 2.5  wand flexion overhead x10 with seated rest break inbetween sets Sit to stands with standing balance x10 with 10sec holds Sit to stands with standing balance and the weight shifts x10, verbal, tactile cues, occasional minA for balance deficits During sit to stands PT provided R knee block to inhibit knee hyperextension and excessive knee flexion. minA to CGA at pelvis to increase lumbar lordosis, postural corrections, and controlled stand to sits.   Gait training:  Gait training with walking sticks unilaterally (L) with minA from PT. Able to progress to verbal cues unilaterally. Bilateral walking sticks attempted, with minA on R from PT for stick placement, gait speed. Pt with significant difficulty with coordinating proper use, mild improvement with time and significant cueing    PT Education - 01/07/19 1242    Education provided  Yes    Education Details  posture, walking sticks    Person(s) Educated  Patient    Methods  Explanation;Demonstration;Verbal cues    Comprehension  Verbalized understanding;Returned demonstration;Verbal cues required       PT Short Term Goals - 12/10/18 1447      PT SHORT TERM GOAL #1   Title  Patient and family members will be indpendent with HEP  and understand they need to do the HEP at home.    Baseline  goes to gy [facility] other day, uses bike, TM and stretches    Status  Achieved        PT Long Term Goals - 12/10/18 1456      PT LONG TERM GOAL #1   Title  Patient will increase 10 meter walk test to >1.26m/s as to improve gait speed for better community ambulation and to reduce fall risk.    Baseline  .68 m/sec, 11/11/18: 1.0 m/s ; 1.04 m/s    Time  6    Period  Weeks    Status  On-going    Target Date  01/21/19      PT LONG TERM GOAL #2   Title  Patient will be modified independent in walking on TM surface with walk aide for 20+ minutes without rest break, to improve walking tolerance and safety.     Baseline  able to walk at 1.2-1.4 mph with 2  HHA x5+ min today;     Time  6    Period  Weeks    Status  Deferred    Target Date  01/21/19      PT LONG TERM GOAL #3   Title  Patient (< 25 years old) will complete five times sit to stand test in < 10 seconds indicating an increased LE strength and improved balance.    Baseline  . 28 sec, 20.48 seconds with intermittent LOB posteriorily/stabilization again chair with knees 12/10/2018    Time  6    Period  Weeks    Target Date  01/21/19      PT LONG TERM GOAL #4   Title  Patient will report decreased incidences of falls and stumbles to less than 1 a month to improve safety at home.     Baseline  2-4 instances of "close calls" has to grab mother to stabilize    Time  6    Period  Weeks    Status  New    Target Date  01/23/19            Plan - 01/07/19 1243    Clinical Impression Statement  Patient able to initiate improved weight shifting and hip extension this session with verbal/tactile cues. Standing static balance posture improved as well with tactile and verbal cues. Patient able to improve gait velocity this session with 1 walking stick, still exhibiting difficulties with coordinating UE movements during ambulation. The patient would benefit from further skilled PT to continue to progress towards goals and mazimize safety.     Rehab Potential  Good    Clinical Impairments Affecting Rehab Potential  negative: lack of compliance with stretches and HEP    PT Frequency  1x / week    PT Duration  6 weeks    PT Treatment/Interventions  Patient/family education;Manual techniques;Therapeutic activities;Therapeutic exercise;Electrical Stimulation;Aquatic Therapy;DME Instruction;Functional mobility training;Gait training;Neuromuscular re-education;ADLs/Self Care Home Management    PT Next Visit Plan  walk aide with gait     PT Home Exercise Plan  stretching hamstring, add, gastroc, sit to stands    Consulted and Agree with Plan of Care  Patient    Family Member Consulted  mom        Patient will benefit from skilled therapeutic intervention in order to improve the following deficits and impairments:  Abnormal gait, Decreased safety awareness, Decreased balance, Dizziness  Visit Diagnosis: Muscle weakness (generalized)  Unsteadiness on feet  Cerebral  palsy, unspecified type [facility])     Problem List Patient Active Problem List   Diagnosis Date Noted  . Loss of weight 07/17/2015  . Depression 07/17/2015  . Cerebral palsy [facility]) 07/17/2015    Olga Coaster PT, DPT 12:48 PM,01/07/19 [phone]  [facility] MAIN [facility] SERVICES [address] [place], [place], [address] Phone: [phone]   Fax:  [phone]  Name: Cherine Drumgoole MRN: [id number] Date of Birth: November 30, 1995

## 2019-01-12 ENCOUNTER — Ambulatory Visit: Payer: Medicaid Other | Admitting: Physical Therapy

## 2019-01-12 NOTE — Therapy (Unsigned)
[facility] MAIN [facility] SERVICES [address] [place], [place], [address] Phone: [phone]   Fax:  [phone]  Patient Details  Name: Renee Walton MRN: [id number] Date of Birth: 1996/10/04 Referring Provider:  No ref. provider found  Encounter Date: 01/12/2019    DPT called patient to let patient know the status of the clinic and ensure that we will be reaching out to schedule when we re-open. DPT fielded any questions patient had at this time and offered guidance on current home program as necessary. Patient had no further questions at this time, and DPT advised that should questions arise patient can reach out to the clinic for guidance via phone.  Olga Coaster PT, DPT 10:57 AM,01/12/19 [phone]  [facility] MAIN [facility] SERVICES [address] [place], [place], [address] Phone: [phone]   Fax:  [phone]

## 2019-01-21 ENCOUNTER — Other Ambulatory Visit: Payer: Self-pay

## 2019-01-21 ENCOUNTER — Encounter: Payer: Self-pay | Admitting: Obstetrics and Gynecology

## 2019-01-21 ENCOUNTER — Ambulatory Visit: Payer: Medicaid Other | Admitting: Obstetrics and Gynecology

## 2019-01-21 ENCOUNTER — Ambulatory Visit (INDEPENDENT_AMBULATORY_CARE_PROVIDER_SITE_OTHER): Payer: Medicaid Other | Admitting: Obstetrics and Gynecology

## 2019-01-21 VITALS — BP 112/76 | Wt 87.0 lb

## 2019-01-21 DIAGNOSIS — Z0000 Encounter for general adult medical examination without abnormal findings: Secondary | ICD-10-CM

## 2019-01-21 DIAGNOSIS — N760 Acute vaginitis: Secondary | ICD-10-CM

## 2019-01-21 DIAGNOSIS — Z3041 Encounter for surveillance of contraceptive pills: Secondary | ICD-10-CM

## 2019-01-21 LAB — POCT WET PREP WITH KOH
Clue Cells Wet Prep HPF POC: NEGATIVE
KOH Prep POC: NEGATIVE
Trichomonas, UA: NEGATIVE
Yeast Wet Prep HPF POC: NEGATIVE

## 2019-01-21 MED ORDER — NORETHIN-ETH ESTRAD-FE BIPHAS 1 MG-10 MCG / 10 MCG PO TABS: 1.0000 | ORAL_TABLET | Freq: Every day | ORAL | 3 refills | Status: DC

## 2019-01-21 MED ORDER — FLUCONAZOLE 150 MG PO TABS: 150.0000 mg | ORAL_TABLET | Freq: Once | ORAL | 0 refills | Status: AC

## 2019-01-21 NOTE — Progress Notes (Signed)
Glori Luis, MD   Chief Complaint  Patient presents with  . Vaginal Discharge    with odor, itchiness and irritation     HPI:      Ms. Makalah Saffo is a 23 y.o. No obstetric history on file. who LMP was No LMP recorded. (Menstrual status: Oral contraceptives)., presents today for increased d/c with itching/irritation, non-fishy odor and dysuria for several months. Sx started after starting OCPs 11/19. No prior abx use. Has not used meds to treat. Uses dove sens skin soap, no dryer sheets, no wipes. No pelvic pain, fevers. Pt does have urinary frequency that is normal for her. Has also had LBP and leg pain which isn't normal for her. Has never been sex active.  Pt started on Lo Loestrin OCPs 11/19 for cycle control/BTB. Did OCPs in past with n/v/headache. Menses are Q2 months on Lo Lo, last 2-3 days, no BTB, minimal dysmen. No n/v/headaches. Really likes them. Doing well. Wants to cont.   Pt with hx of CP, has limited mobility. Pap never done due to difficult exam/discomfort for pt.  Past Medical History:  Diagnosis Date  . Breakthrough bleeding   . Cerebral palsy (HCC)   . GAD (generalized anxiety disorder)   . Migraine headache     Past Surgical History:  Procedure Laterality Date  . LEG SURGERY     bilateral x2 in 2008    Family History  Problem Relation Age of Onset  . Stroke Other        grandparent  . Hypertension Other        parent  . Mental illness Other        parent  . Diabetes Other        parent, grandparent    Social History   Socioeconomic History  . Marital status: Single    Spouse name: Not on file  . Number of children: Not on file  . Years of education: Not on file  . Highest education level: Not on file  Occupational History  . Not on file  Social Needs  . Financial resource strain: Not on file  . Food insecurity:    Worry: Not on file    Inability: Not on file  . Transportation needs:    Medical: Not on file    Non-medical:  Not on file  Tobacco Use  . Smoking status: Never Smoker  . Smokeless tobacco: Never Used  Substance and Sexual Activity  . Alcohol use: No    Alcohol/week: 0.0 standard drinks  . Drug use: No  . Sexual activity: Never  Lifestyle  . Physical activity:    Days per week: Not on file    Minutes per session: Not on file  . Stress: Not on file  Relationships  . Social connections:    Talks on phone: Not on file    Gets together: Not on file    Attends religious service: Not on file    Active member of club or organization: Not on file    Attends meetings of clubs or organizations: Not on file    Relationship status: Not on file  . Intimate partner violence:    Fear of current or ex partner: Not on file    Emotionally abused: Not on file    Physically abused: Not on file    Forced sexual activity: Not on file  Other Topics Concern  . Not on file  Social History Narrative  . Not on file  Outpatient Medications Prior to Visit  Medication Sig Dispense Refill  . baclofen (LIORESAL) 10 MG tablet Take 10 mg by mouth.    . escitalopram (LEXAPRO) 5 MG tablet Take 5 mg by mouth daily.    . Multiple Vitamins-Minerals (CENTRUM SILVER ULTRA WOMENS PO) Take by mouth.    . ondansetron (ZOFRAN) 4 MG tablet Take 4 mg by mouth.    . senna-docusate (SENOKOT-S) 8.6-50 MG tablet Take by mouth.    . Vitamin D, Ergocalciferol, (DRISDOL) 50000 units CAPS capsule Take 50,000 Units by mouth every 7 (seven) days.    . Norethindrone-Ethinyl Estradiol-Fe Biphas (LO LOESTRIN FE) 1 MG-10 MCG / 10 MCG tablet Take 1 tablet by mouth daily. 84 tablet 3   No facility-administered medications prior to visit.       ROS:  Review of Systems  Constitutional: Negative for fatigue, fever and unexpected weight change.  Respiratory: Negative for cough, shortness of breath and wheezing.   Cardiovascular: Negative for chest pain, palpitations and leg swelling.  Gastrointestinal: Negative for blood in stool,  constipation, diarrhea, nausea and vomiting.  Endocrine: Negative for cold intolerance, heat intolerance and polyuria.  Genitourinary: Positive for dysuria, frequency and vaginal discharge. Negative for dyspareunia, flank pain, genital sores, hematuria, menstrual problem, pelvic pain, urgency, vaginal bleeding and vaginal pain.  Musculoskeletal: Positive for back pain. Negative for joint swelling and myalgias.  Skin: Negative for rash.  Neurological: Negative for dizziness, syncope, light-headedness, numbness and headaches.  Hematological: Negative for adenopathy.  Psychiatric/Behavioral: Negative for agitation, confusion, sleep disturbance and suicidal ideas. The patient is not nervous/anxious.     OBJECTIVE:   Vitals:  BP 112/76   Wt 87 lb (39.5 kg)   BMI 15.71 kg/m   Physical Exam Vitals signs reviewed.  Neck:     Musculoskeletal: Normal range of motion.     Thyroid: No thyromegaly.     Trachea: No tracheal deviation.  Pulmonary:     Effort: Pulmonary effort is normal.  Genitourinary:    Comments: MINIMAL WHITE D/C, MINIMAL ERYTHEMA AT VAG OPENING; NO LESIONS  PT COULDN'T TOLERATE SPECULUM; D/C OBTAINED ON QTIP WHICH WAS PAINFUL FOR PT Neurological:     Mental Status: She is alert and oriented to person, place, and time.  Psychiatric:        Judgment: Judgment normal.     Results: Results for orders placed or performed in visit on 01/21/19 (from the past 24 hour(s))  POCT Wet Prep with KOH     Status: Normal   Collection Time: 01/21/19 10:45 AM  Result Value Ref Range   Trichomonas, UA Negative    Clue Cells Wet Prep HPF POC neg    Epithelial Wet Prep HPF POC     Yeast Wet Prep HPF POC neg    Bacteria Wet Prep HPF POC     RBC Wet Prep HPF POC     WBC Wet Prep HPF POC     KOH Prep POC Negative Negative     Assessment/Plan: Acute vaginitis - Neg wet prep/minimal exam. Treat empirically for yeast. Rx diflucan. F/u prn.  - Plan: fluconazole (DIFLUCAN) 150 MG  tablet, POCT Wet Prep with KOH  Encounter for surveillance of contraceptive pills - Doing well with Lo Loestrin. Rx RF. F/u in 1 yr. - Plan: Norethindrone-Ethinyl Estradiol-Fe Biphas (LO LOESTRIN FE) 1 MG-10 MCG / 10 MCG tablet    Meds ordered this encounter  Medications  . fluconazole (DIFLUCAN) 150 MG tablet    Sig: Take 1  tablet (150 mg total) by mouth once for 1 dose.    Dispense:  1 tablet    Refill:  0    Order Specific Question:   Supervising Provider    Answer:   Nadara Mustard [id number]  . Norethindrone-Ethinyl Estradiol-Fe Biphas (LO LOESTRIN FE) 1 MG-10 MCG / 10 MCG tablet    Sig: Take 1 tablet by mouth daily.    Dispense:  84 tablet    Refill:  3    Order Specific Question:   Supervising Provider    Answer:   Nadara Mustard [[id number]]      Return if symptoms worsen or fail to improve.  Aarushi Hemric B. Kazaria Gaertner, PA-C 01/21/2019 10:46 AM

## 2019-01-21 NOTE — Patient Instructions (Signed)
I value your feedback and entrusting us with your care. If you get a [facility] patient survey, I would appreciate you taking the time to let us know about your experience today. Thank you! 

## 2019-02-10 IMAGING — CR DG PELVIS 1-2V
1 series · 1 of 1 positions shown · non-contrast
Comparison: None.

CLINICAL DATA: Congenital spastic paralysis.  Cerebral palsy.

EXAM:
PELVIS - 1-2 VIEW

[dg pelvis 1-2 views]
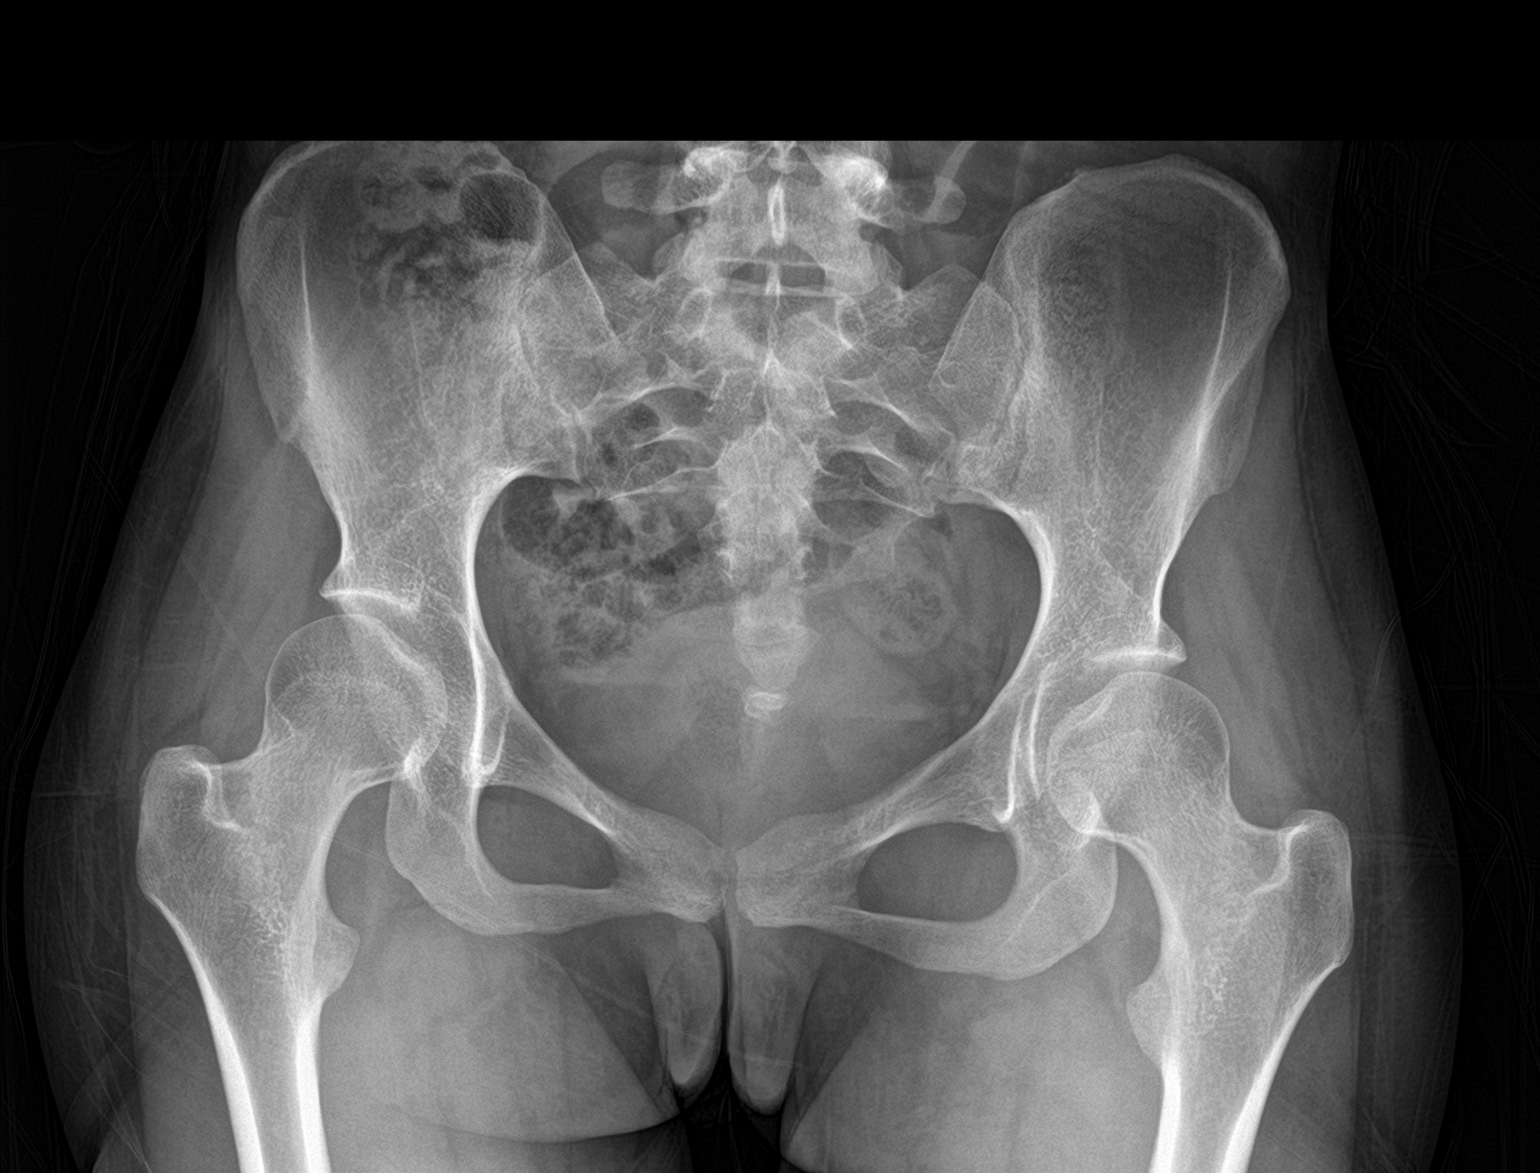

[1 of 1 positions shown; findings below may reference images not displayed]

FINDINGS: There is no evidence of pelvic fracture or diastasis. No pelvic bone
lesions are seen.
IMPRESSION: Negative.

## 2019-04-06 ENCOUNTER — Encounter: Payer: Self-pay | Admitting: Physical Therapy

## 2019-04-06 ENCOUNTER — Other Ambulatory Visit: Payer: Self-pay

## 2019-04-06 ENCOUNTER — Ambulatory Visit: Payer: Medicaid Other | Attending: Family Medicine | Admitting: Physical Therapy

## 2019-04-06 DIAGNOSIS — R2681 Unsteadiness on feet: Secondary | ICD-10-CM | POA: Diagnosis present

## 2019-04-06 DIAGNOSIS — M6281 Muscle weakness (generalized): Secondary | ICD-10-CM | POA: Diagnosis present

## 2019-04-07 NOTE — Therapy (Signed)
[facility] [address] [place], [place], [address] Phone: [phone]   Fax:  [phone]  Physical Therapy Evaluation  Patient Details  Name: Renee Walton MRN: [id number] Date of Birth: Aug 14, 1996 Referring Provider (PT): [place]UCH, [place] KARRAT   Encounter Date: 04/06/2019  PT End of Session - 04/07/19 1030    Visit Number  1    Number of Visits  9    Date for PT Re-Evaluation  06/02/19    Authorization Type  medicaid    PT Start Time  1332    PT Stop Time  1425    PT Time Calculation (min)  53 min    Equipment Utilized During Treatment  Gait belt    Activity Tolerance  Patient tolerated treatment well    Behavior During Therapy  WFL for tasks assessed/performed       Past Medical History:  Diagnosis Date  . Breakthrough bleeding   . Cerebral palsy (HCC)   . GAD (generalized anxiety disorder)   . Migraine headache     Past Surgical History:  Procedure Laterality Date  . LEG SURGERY     bilateral x2 in 2008    There were no vitals filed for this visit.   Subjective Assessment - 04/06/19 1334    Subjective  Patient reports she is doing well; She has been unable to go to the gym due to Coronavirus; She denies any new falls; She moved and states that she lost her AFOs and hasn't been wearing them for last year. She reports she is walking without any assistive devices at home and will hold her mom's hand in the community; Her mom helps with stretches and exercise at home.    Patient is accompained by:  Family member    Pertinent History  pertinent factors affecting rehab: cerebral palsy, high fall risk; good family support; Patient is walking at home without AD; denies any new falls;    Limitations  Standing;Walking    How long can you stand comfortably?  5-15 min    How long can you walk comfortably?  30 min;     Diagnostic tests  none recent;     Patient Stated Goals  be more independent, walk without  person assist,     Currently in Pain?  No/denies    Multiple Pain Sites  No         [facility] PT Assessment - 04/07/19 0001      Assessment   Medical Diagnosis  CP    Referring Provider (PT)  [facility]ARRAT    Hand Dominance  Left    Next MD Visit  August 2020    Prior Therapy  had PT which started Dec 2019 for walkaide evaluation; therapy stopped in March 2020 due to coronavirus pandemic;       Precautions   Precautions  None;Fall    Required Braces or Orthoses  --   none     Restrictions   Weight Bearing Restrictions  No      Home Environment   Living Environment  Private residence    Additional Comments  Lives in multilevel home, room and bath on the main floor; level entry home; mom helps with tub transfers; but patient mod I for bathing;       Prior Function   Level of Independence  Independent with household mobility without device   inconsistent independence with household mobility    Vocation  Works at home  [employer] and [employer]   Leisure  watching youtube/crafts/drawing/hanging out with family      Cognition   Overall Cognitive Status  History of cognitive impairments - at baseline      AROM   Overall AROM Comments  pt able to initiate AROM in left ankle DF but unable to achieve past neutral; unable to actively dorsiflex right foot; passive DF 10 degrees bilaterally indicating good flexibility in ankle      Strength   Right Hip Flexion  3-/5    Right Hip Extension  3-/5    Right Hip ABduction  2+/5    Right Hip ADduction  2+/5    Left Hip Flexion  3/5    Left Hip Extension  2+/5    Left Hip ABduction  3-/5    Left Hip ADduction  3-/5    Right Knee Flexion  2+/5    Right Knee Extension  3-/5    Left Knee Flexion  4-/5    Left Knee Extension  3/5    Right Ankle Dorsiflexion  1/5    Left Ankle Dorsiflexion  3-/5      Transfers   Comments  requires HHA to push up from chair for transfers;       Ambulation/Gait   Gait Comments  pt ambulates  without AD, wide base of support with increased toe out with increased vaulting on left side for increased foot clearance during swing on RLE, uneven step length and uneven cadence with increased lateral trunk sway due to tone and hip abductor weakness; able to walk without AD with supervision for safety; decreased balance noted with impaired ability to start and stop gait unsupported; PT applied walkaide to RLE, when walkaide activated, patient able to exhibit better foot clearance on right with less vaulting on left and improved reciprocal gait pattern and faster gait speed; less lateral trunk sway noted;       Standardized Balance Assessment   10 Meter Walk  0.98 m/s without walkaide, 1.08 m/s with walkaide, home ambulator, slight limitation in community ambulator                Objective measurements completed on examination: See above findings.              PT Education - 04/07/19 1030    Education provided  Yes    Education Details  recommendation/plan of care    Person(s) Educated  Patient;Parent(s)    Methods  Explanation    Comprehension  Verbalized understanding       PT Short Term Goals - 04/07/19 1312      PT SHORT TERM GOAL #1   Title  Patient and family members will be indpendent with HEP and understand they need to do the HEP at home.    Baseline  Was going to the gym but unable to go right now due to coronavirus;    Time  4    Period  Weeks    Status  New    Target Date  05/05/19        PT Long Term Goals - 04/07/19 1313      PT LONG TERM GOAL #1   Title  Patient will increase 10 meter walk test to >1.6533m/s as to improve gait speed for better community ambulation and to reduce fall risk.    Baseline  04/06/19: 0.98 m/s without walkaide, 1.08 m/s with walkaide;    Time  8    Period  Weeks  Status  New    Target Date  06/02/19      PT LONG TERM GOAL #2   Title  Patient will be modified independent in walking on TM surface with walk aide for  20+ minutes without rest break, to improve walking tolerance and safety.     Baseline  able to walk 1.2-1.4 mph with 2 HHA for short distance;    Time  8    Period  Weeks    Status  New    Target Date  06/02/19      PT LONG TERM GOAL #3   Title  Patient (< 77 years old) will complete five times sit to stand test in < 10 seconds indicating an increased LE strength and improved balance.    Baseline  requires 2HHA to push up from chair    Time  8    Period  Weeks    Status  New    Target Date  06/02/19      PT LONG TERM GOAL #4   Title  Patient will report decreased incidences of falls and stumbles to less than 1 a month to improve safety at home.     Baseline  2-4 instances of "close calls" has to grab mother to stabilize    Time  8    Period  Weeks    Status  New    Target Date  06/02/19             Plan - 04/07/19 1031    Clinical Impression Statement  23 yo Female returns to PT following break due to coronavirus. Patient was referred to PT for evaluation and treatment for walkaide device and gait abnormality associated with cerebral palsy. Prior to pandemic, she was consistent with gym exercise and stretching at home. Due to pandemic, she has been staying home with family and has seen a slight decline in mobility due to inability to exercise at gym. Patient does exhibit improved gait mechanics with walkaide with improved right foot clearance,less vaulting on left side, improved weight shift and improved cadence/step length. She also ambulates slightly faster with walkaide compared to without device. Patient would benefit from walkaide device to improve gait mechanics and reduce fall risk. She would benefit from skilled PT intervention to address gait deficits and improve balance and mobility;    Personal Factors and Comorbidities  Finances;Comorbidity 1    Comorbidities  cerebral palsy    Examination-Activity Limitations  Bathing;Squat;Stairs;Locomotion Level;Stand;Transfers     Examination-Participation Restrictions  Shop;Cleaning;Community Activity;Other    Stability/Clinical Decision Making  Stable/Uncomplicated    Clinical Decision Making  Low    Rehab Potential  Good    Clinical Impairments Affecting Rehab Potential  positive: motivated, good family support    PT Frequency  1x / week    PT Duration  8 weeks    PT Treatment/Interventions  Patient/family education;Manual techniques;Therapeutic activities;Therapeutic exercise;Electrical Stimulation;Aquatic Therapy;DME Instruction;Functional mobility training;Gait training;Neuromuscular re-education;ADLs/Self Care Home Management;Moist Heat;Stair training;Balance training;Orthotic Fit/Training    PT Next Visit Plan  walk aide with gait     PT Home Exercise Plan  stretching hamstring, add, gastroc, sit to stands    Consulted and Agree with Plan of Care  Patient    Family Member Consulted  mom       Patient will benefit from skilled therapeutic intervention in order to improve the following deficits and impairments:  Abnormal gait, Decreased balance, Decreased endurance, Decreased mobility, Difficulty walking, Decreased range of motion, Decreased activity  tolerance, Decreased safety awareness, Decreased strength  Visit Diagnosis: 1. Muscle weakness (generalized)   2. Unsteadiness on feet        Problem List Patient Active Problem List   Diagnosis Date Noted  . Loss of weight 07/17/2015  . Depression 07/17/2015  . Cerebral palsy (HCC) 07/17/2015    Obryan Radu PT, DPT 04/07/2019, 1:17 PM  [facility] [address] [place], [place], [address] Phone: [phone]   Fax:  [phone]  Name: Renee Walton MRN: [id number] Date of Birth: 31-Aug-1996

## 2019-04-13 ENCOUNTER — Other Ambulatory Visit: Payer: Self-pay

## 2019-04-13 ENCOUNTER — Encounter: Payer: Self-pay | Admitting: Physical Therapy

## 2019-04-13 ENCOUNTER — Ambulatory Visit: Payer: Medicaid Other | Admitting: Physical Therapy

## 2019-04-13 DIAGNOSIS — R2681 Unsteadiness on feet: Secondary | ICD-10-CM

## 2019-04-13 DIAGNOSIS — M6281 Muscle weakness (generalized): Secondary | ICD-10-CM | POA: Diagnosis not present

## 2019-04-13 NOTE — Patient Instructions (Signed)
Access Code: [facility]  URL: [email or web address]  Date: 04/13/2019  Prepared by: Blanche East   Exercises  Supine Bridge - 20 reps - 2 sets - 1x daily - 7x weekly  Hooklying Isometric Clamshell - 15 reps - 2 sets - 1x daily - 7x weekly  Clamshell - 15 reps - 2 sets - 1x daily - 7x weekly  Tall Kneeling - 10 reps - 1 sets - 1x daily - 7x weekly  Kneeling Backward Lean - 3 reps - 1 sets - 2-3 hold - 1x daily - 7x weekly

## 2019-04-14 NOTE — Therapy (Signed)
[place] MAIN [facility] SERVICES [address] [place], [place], [address] Phone: [phone]   Fax:  [phone]  Physical Therapy Treatment  Patient Details  Name: Renee Walton MRN: [id number] Date of Birth: 1996/05/05 Referring Provider (PT): [place], [place]   Encounter Date: 04/13/2019  PT End of Session - 04/14/19 1321    Visit Number  2    Number of Visits  9    Date for PT Re-Evaluation  06/02/19    Authorization Type  medicaid, approved 8 visits 6/23-8/17    Authorization - Visit Number  1    Authorization - Number of Visits  8    PT Start Time  1132    PT Stop Time  1215    PT Time Calculation (min)  43 min    Equipment Utilized During Treatment  Gait belt    Activity Tolerance  Patient tolerated treatment well;No increased pain    Behavior During Therapy  WFL for tasks assessed/performed       Past Medical History:  Diagnosis Date  . Breakthrough bleeding   . Cerebral palsy ([place])   . GAD (generalized anxiety disorder)   . Migraine headache     Past Surgical History:  Procedure Laterality Date  . LEG SURGERY     bilateral x2 in 2008    There were no vitals filed for this visit.  Subjective Assessment - 04/13/19 1139    Subjective  Patient reports that she has applied for a home aide and is hoping to get stronger and improve her balance so that she can be independent. She has been primarily walking as her exercise and her mom helps her with some stretches;    Patient is accompained by:  Family member    Pertinent History  pertinent factors affecting rehab: cerebral palsy, high fall risk; good family support; Patient is walking at home without AD; denies any new falls;    Limitations  Standing;Walking    How long can you stand comfortably?  5-15 min    How long can you walk comfortably?  30 min;     Diagnostic tests  none recent;     Patient Stated Goals  be more independent, walk without person assist,      Currently in Pain?  No/denies          TREATMENT: PT educated patient in walkaide cost and various community organizations that could assist with coverage as her insurance denied coverage;  Instructed patient in LE strengthening exercise as part of HEP: Patient hooklying: -bridges with arms across chest x15 reps with  Mod VCs to avoid thoracic extension and to improve core activation for better hip stabilization; Required cues for proper foot placement and to avoid pushing through arms for better hip strengthening; Required min Vcs to slow down LE movement for better motor control and strengthening;  -hip abduction/ER yellow tband x15 reps with  Mod VCs for proper positioning and to increase BLE hip abduction for better hip strengthening; patient exhibits decreased RLE muscle activation due to weakness; She had increased difficulty initiating BLE movement due to incoordination;   Patient sidelying: -clamshells without tband resistance x15 reps bilaterally with mod VCs for proper positioning including to avoid trunk rotation and to isolate hip abduction for better strengthening; Exhibits partial ROM due to weakness; able to exhibit better muscle activation on LLE compared to RLE   Patient tall kneeling: -side stepping on knees x5 feet x1 set each direction with min  A, HHA for better safety and balance; Required mod VCs to keep erect posture and increase gluteal activation for better hip stabilization and strengthening;  Tall kneeling static hold unsupported x2 min with multiple attempts, min A for safety; Patient has difficulty holding unsupported tall kneeling due to weakness in gluteal muscle;  Response to treatment: Patient tolerated well; She required min A with some exercise and mod VCs for correct positioning and exercise technique; provided written handout for better adherence and technique; Patient reports mild fatigue at end of session, but denies any pain;                       PT Education - 04/14/19 1321    Education provided  Yes    Education Details  walkaide; LE strengthening as part of HEP    Person(s) Educated  Patient    Methods  Explanation;Verbal cues    Comprehension  Verbalized understanding;Returned demonstration;Verbal cues required;Need further instruction       PT Short Term Goals - 04/07/19 1312      PT SHORT TERM GOAL #1   Title  Patient and family members will be indpendent with HEP and understand they need to do the HEP at home.    Baseline  Was going to the gym but unable to go right now due to coronavirus;    Time  4    Period  Weeks    Status  New    Target Date  05/05/19        PT Long Term Goals - 04/07/19 1313      PT LONG TERM GOAL #1   Title  Patient will increase 10 meter walk test to >1.34107m/s as to improve gait speed for better community ambulation and to reduce fall risk.    Baseline  04/06/19: 0.98 m/s without walkaide, 1.08 m/s with walkaide;    Time  8    Period  Weeks    Status  New    Target Date  06/02/19      PT LONG TERM GOAL #2   Title  Patient will be modified independent in walking on TM surface with walk aide for 20+ minutes without rest break, to improve walking tolerance and safety.     Baseline  able to walk 1.2-1.4 mph with 2 HHA for short distance;    Time  8    Period  Weeks    Status  New    Target Date  06/02/19      PT LONG TERM GOAL #3   Title  Patient (< 25 years old) will complete five times sit to stand test in < 10 seconds indicating an increased LE strength and improved balance.    Baseline  requires 2HHA to push up from chair    Time  8    Period  Weeks    Status  New    Target Date  06/02/19      PT LONG TERM GOAL #4   Title  Patient will report decreased incidences of falls and stumbles to less than 1 a month to improve safety at home.     Baseline  2-4 instances of "close calls" has to grab mother to stabilize    Time  8    Period  Weeks     Status  New    Target Date  06/02/19            Plan - 04/14/19 1322  Clinical Impression Statement  Patient motivated and tolerated session well. Instructed patient in advanced LE strengthening as part of HEP. Expressed to patient and caregiver importance of adherence for better LE strength to improve overall gait mechanics. Patient able to complete exercise with minimal assist. She does require cues for correct positioning and exercise technique; Patient educated on cost of walkaide device as her insurance denied coverage. She will reach out to [employer] to see if they will offer financial assistance. She would benefit from additional skilled PT intervention to improve strength, balance and mobility;    Personal Factors and Comorbidities  Finances;Comorbidity 1    Comorbidities  cerebral palsy    Examination-Activity Limitations  Bathing;Squat;Stairs;Locomotion Level;Stand;Transfers    Examination-Participation Restrictions  Shop;Cleaning;Community Activity;Other    Stability/Clinical Decision Making  Stable/Uncomplicated    Rehab Potential  Good    Clinical Impairments Affecting Rehab Potential  positive: motivated, good family support    PT Frequency  1x / week    PT Duration  8 weeks    PT Treatment/Interventions  Patient/family education;Manual techniques;Therapeutic activities;Therapeutic exercise;Electrical Stimulation;Aquatic Therapy;DME Instruction;Functional mobility training;Gait training;Neuromuscular re-education;ADLs/Self Care Home Management;Moist Heat;Stair training;Balance training;Orthotic Fit/Training    PT Next Visit Plan  walk aide with gait     PT Home Exercise Plan  stretching hamstring, add, gastroc, sit to stands    Consulted and Agree with Plan of Care  Patient    Family Member Consulted  mom       Patient will benefit from skilled therapeutic intervention in order to improve the following deficits and impairments:  Abnormal gait, Decreased balance,  Decreased endurance, Decreased mobility, Difficulty walking, Decreased range of motion, Decreased activity tolerance, Decreased safety awareness, Decreased strength  Visit Diagnosis: 1. Muscle weakness (generalized)   2. Unsteadiness on feet        Problem List Patient Active Problem List   Diagnosis Date Noted  . Loss of weight 07/17/2015  . Depression 07/17/2015  . Cerebral palsy (HCC) 07/17/2015    Tharon Bomar PT, DPT 04/14/2019, 1:24 PM  [facility] [facility] [address] [place], [place], [address] Phone: [phone]   Fax:  [phone]  Name: Renee Walton MRN: [id number] Date of Birth: Jul 27, 1996

## 2019-04-20 ENCOUNTER — Ambulatory Visit: Payer: Medicaid Other | Admitting: Physical Therapy

## 2019-04-21 ENCOUNTER — Ambulatory Visit: Payer: Medicaid Other | Attending: Family Medicine

## 2019-04-21 ENCOUNTER — Other Ambulatory Visit: Payer: Self-pay

## 2019-04-21 DIAGNOSIS — G809 Cerebral palsy, unspecified: Secondary | ICD-10-CM | POA: Diagnosis present

## 2019-04-21 DIAGNOSIS — R2681 Unsteadiness on feet: Secondary | ICD-10-CM | POA: Insufficient documentation

## 2019-04-21 DIAGNOSIS — M6281 Muscle weakness (generalized): Secondary | ICD-10-CM | POA: Insufficient documentation

## 2019-04-21 NOTE — Therapy (Signed)
[place] [facility] [address] [place], [place], [address] Phone: [phone]   Fax:  [phone]  Physical Therapy Treatment  Patient Details  Name: Colonel BaldFatima Johnsen MRN: [id number] Date of Birth: 09-30-1996 Referring Provider (PT): [place]   Encounter Date: 04/21/2019  PT End of Session - 04/21/19 1148    Visit Number  3    Number of Visits  9    Date for PT Re-Evaluation  06/02/19    Authorization Type  medicaid, approved 8 visits 6/23-8/17    Authorization - Visit Number  2    Authorization - Number of Visits  8    PT Start Time  1125    PT Stop Time  1210    PT Time Calculation (min)  45 min    Equipment Utilized During Treatment  Gait belt    Activity Tolerance  Patient tolerated treatment well;No increased pain    Behavior During Therapy  WFL for tasks assessed/performed       Past Medical History:  Diagnosis Date  . Breakthrough bleeding   . Cerebral palsy (HCC)   . GAD (generalized anxiety disorder)   . Migraine headache     Past Surgical History:  Procedure Laterality Date  . LEG SURGERY     bilateral x2 in 2008    There were no vitals filed for this visit.    *Reviewed patient strengthening exercise as part of HEP:  Patient hooklying: -bridges with arms across chest 2x15 reps; Able to get to neutral hip flexion, Assistance to maintain RLE  in place from sliding.  -hip abduction/ER yellow tband 2x15 reps (min-modA provided) activation remains limited, mostly extension)  -Supine short adductor stretch (hooklying) with physioball at knees, 2x60sec intermittently performed with above.    Patient sidelying: -clamshells gravity resistance only x15 reps bilaterally with mod VCs for proper positioning including to avoid trunk rotation and to isolate hip abduction for better strengthening; Exhibits partial ROM due to weakness; able to exhibit better muscle activation on LLE compared to RLE  *mom  shows how to help with limb alignment and AA/ROM    Patient tall kneeling: -side stepping on knees x5 feet x2 set each direction with min A, HHA for better safety and balance; Required mod VCs to keep erect posture and increase gluteal activation for better hip stabilization and strengthening; Mom assist with shoes, Thereasa Parkinauthor provides support of hands for patient. Pt report using couch for support.    -Tall kneeling static hold unsupported x2 min with multiple attempts, min A for safety; Patient has difficulty holding unsupported tall kneeling due to weakness in gluteal muscle; demonstrated with mom assisting; excellent upright posture. Educated on use of towel to protect knees at home if carpet does not provide enough cushion.   NonHEP exercises:    -Sidleying Hip ABDCT isometrics 10x3secH bilat , author assist to neutral position.  -Hamstrings bridge, feet in chair, slight kne flexion 1x15 bilat  -Supine ABCDT heel slides, foot on step (AA/ROM to limit hip-flexor-dominant strategy)    PT Short Term Goals - 04/07/19 1312      PT SHORT TERM GOAL #1   Title  Patient and family members will be indpendent with HEP and understand they need to do the HEP at home.    Baseline  Was going to the gym but unable to go right now due to coronavirus;    Time  4    Period  Weeks    Status  New    Target Date  05/05/19        PT Long Term Goals - 04/07/19 1313      PT LONG TERM GOAL #1   Title  Patient will increase 10 meter walk test to >1.69m/s as to improve gait speed for better community ambulation and to reduce fall risk.    Baseline  04/06/19: 0.98 m/s without walkaide, 1.08 m/s with walkaide;    Time  8    Period  Weeks    Status  New    Target Date  06/02/19      PT LONG TERM GOAL #2   Title  Patient will be modified independent in walking on TM surface with walk aide for 20+ minutes without rest break, to improve walking tolerance and safety.     Baseline  able to walk 1.2-1.4 mph with 2  HHA for short distance;    Time  8    Period  Weeks    Status  New    Target Date  06/02/19      PT LONG TERM GOAL #3   Title  Patient (< 25 years old) will complete five times sit to stand test in < 10 seconds indicating an increased LE strength and improved balance.    Baseline  requires 2HHA to push up from chair    Time  8    Period  Weeks    Status  New    Target Date  06/02/19      PT LONG TERM GOAL #4   Title  Patient will report decreased incidences of falls and stumbles to less than 1 a month to improve safety at home.     Baseline  2-4 instances of "close calls" has to grab mother to stabilize    Time  8    Period  Weeks    Status  New    Target Date  06/02/19            Plan - 04/21/19 1150    Clinical Impression Statement  In general, patient demonstrating overall good tolerance to therapy session this date, reasonable accommodations are made during session to allow adequate rest between activities as needed. RLE continues to demonstrate about 50% of strength and activation of left.  Pt demonstrates great motivation, focused on best performance this date. It is clear that they have been working on HEP together. Strength deficits remain obvious to author, as well as easy fatiguability. Pt given intermittent multimodal cues to teach best possible form with exercises. Adductor stretches seem to provide some benefit when performing hip horizontal ABDCT. Pt continues to make gradual progress toward most goals. No home exercise updates made at this time.    Rehab Potential  Good    Clinical Impairments Affecting Rehab Potential  positive: motivated, good family support    PT Frequency  1x / week    PT Duration  8 weeks    PT Treatment/Interventions  Patient/family education;Manual techniques;Therapeutic activities;Therapeutic exercise;Electrical Stimulation;Aquatic Therapy;DME Instruction;Functional mobility training;Gait training;Neuromuscular re-education;ADLs/Self Care  Home Management;Moist Heat;Stair training;Balance training;Orthotic Fit/Training    PT Next Visit Plan  walk aide with gait     PT Home Exercise Plan  Hooklying Bridge, Bilat hooklying clam c band, sidelying clam grav resist, tall kneeling balance, tall kneeling side step.   consider high and low adductor stretchs   Consulted and Agree with Plan of Care  Patient    Family Member Consulted  mom  Patient will benefit from skilled therapeutic intervention in order to improve the following deficits and impairments:  Abnormal gait, Decreased balance, Decreased endurance, Decreased mobility, Difficulty walking, Decreased range of motion, Decreased activity tolerance, Decreased safety awareness, Decreased strength  Visit Diagnosis: 1. Muscle weakness (generalized)   2. Unsteadiness on feet   3. Cerebral palsy, unspecified type [facility](HCC)        Problem List Patient Active Problem List   Diagnosis Date Noted  . Loss of weight 07/17/2015  . Depression 07/17/2015  . Cerebral palsy (HCC) 07/17/2015    12:32 PM, 04/21/19 Rosamaria LintsAllan C Buccola, PT, DPT Physical Therapist - [facility]  Outpatient Physical Therapy- Main Campus [phone]     Rosamaria LintsBuccola,Allan C 04/21/2019, 12:31 PM  [place] [facility] [address] [place], [place], [address] Phone: [phone]   Fax:  [phone]  Name: Colonel BaldFatima Weathersby MRN: [id number] Date of Birth: 25-May-1996

## 2019-04-28 ENCOUNTER — Ambulatory Visit: Payer: Medicaid Other | Admitting: Physical Therapy

## 2019-04-28 ENCOUNTER — Encounter: Payer: Self-pay | Admitting: Physical Therapy

## 2019-04-28 ENCOUNTER — Other Ambulatory Visit: Payer: Self-pay

## 2019-04-28 DIAGNOSIS — R2681 Unsteadiness on feet: Secondary | ICD-10-CM

## 2019-04-28 DIAGNOSIS — M6281 Muscle weakness (generalized): Secondary | ICD-10-CM | POA: Diagnosis not present

## 2019-04-28 NOTE — Therapy (Signed)
[place] MAIN [facility] SERVICES [address] [place], [place], [address] Phone: [phone]   Fax:  [phone]  Physical Therapy Treatment  Patient Details  Name: Renee Walton MRN: [id number] Date of Birth: 10-03-1996 Referring Provider (PT): [place], [place]   Encounter Date: 04/28/2019  PT End of Session - 04/28/19 1112    Visit Number  4    Number of Visits  9    Date for PT Re-Evaluation  06/02/19    Authorization Type  medicaid, approved 8 visits 6/23-8/17    Authorization - Visit Number  4    Authorization - Number of Visits  8    PT Start Time  2778    PT Stop Time  2423    PT Time Calculation (min)  43 min    Equipment Utilized During Treatment  Gait belt    Activity Tolerance  Patient tolerated treatment well;No increased pain    Behavior During Therapy  WFL for tasks assessed/performed       Past Medical History:  Diagnosis Date  . Breakthrough bleeding   . Cerebral palsy ([place])   . GAD (generalized anxiety disorder)   . Migraine headache     Past Surgical History:  Procedure Laterality Date  . LEG SURGERY     bilateral x2 in 2008    There were no vitals filed for this visit.  Subjective Assessment - 04/28/19 1110    Subjective  Patient reports doing well; Reports adherence with HEP; She is still hoping to get a walkaide;    Patient is accompained by:  Family member    Pertinent History  pertinent factors affecting rehab: cerebral palsy, high fall risk; good family support; Patient is walking at home without AD; denies any new falls;    Limitations  Standing;Walking    How long can you stand comfortably?  5-15 min    How long can you walk comfortably?  30 min;     Diagnostic tests  none recent;     Patient Stated Goals  be more independent, walk without person assist,     Currently in Pain?  No/denies    Multiple Pain Sites  No            TREATMENT: PT educated patient in walkaide cost and various  community organizations that could assist with coverage as her insurance denied coverage; She is going to follow up with family to see what payment options she wants to pursue;   Re-assessed and advanced HEP:  Patient hooklying: -bridges with arms across chest x5 reps with good hip ROM and motor control noted; Advanced exercise to single leg bridge in figure 4 position: -x10 reps each LE with mod VCs for positioning and to increase gluteal activation to keep hips in neutral alignment; Patient required min A to achieve position due to hip adductor tone;  Instructed patient figure 4 stretch to improve tissue extensibility 20 sec hold x2 bilaterally;  Patient sidelying: -clamshells without tband resistance x10 reps bilaterally with mod VCs for proper positioning including to avoid trunk rotation and to isolate hip abduction for better strengthening; Exhibits partial ROM due to weakness; able to exhibit better muscle activation on LLE compared to RLE   Patient tall kneeling: Able to hold tall knee position well unsupported; Advanced HEP  -tall kneeling with BUE arm lift x10 reps with cues for keeping erect posture and hip extension for better gluteal control; -tall kneeling BUE trunk rotation side/side x5 reps with  cues to keep erect posture when turning for better upper trunk control; -squat to sitting on heel and then up to tall kneeling x10 reps unsupported; patient able to achieve full tall kneel with good gluteal control;   Response to treatment: Patient tolerated well; She required min A with some exercise and mod VCs for correct positioning and exercise technique; provided written handout for better adherence and technique; Patient reports mild fatigue at end of session, but denies any pain;                                           PT Education - 04/28/19 1111    Education provided  Yes    Education Details  walkaide/LE strengthening as  part of HEP    Person(s) Educated  Patient    Methods  Explanation;Verbal cues    Comprehension  Verbalized understanding;Returned demonstration;Verbal cues required;Need further instruction       PT Short Term Goals - 04/07/19 1312      PT SHORT TERM GOAL #1   Title  Patient and family members will be indpendent with HEP and understand they need to do the HEP at home.    Baseline  Was going to the gym but unable to go right now due to coronavirus;    Time  4    Period  Weeks    Status  New    Target Date  05/05/19        PT Long Term Goals - 04/07/19 1313      PT LONG TERM GOAL #1   Title  Patient will increase 10 meter walk test to >1.6677m/s as to improve gait speed for better community ambulation and to reduce fall risk.    Baseline  04/06/19: 0.98 m/s without walkaide, 1.08 m/s with walkaide;    Time  8    Period  Weeks    Status  New    Target Date  06/02/19      PT LONG TERM GOAL #2   Title  Patient will be modified independent in walking on TM surface with walk aide for 20+ minutes without rest break, to improve walking tolerance and safety.     Baseline  able to walk 1.2-1.4 mph with 2 HHA for short distance;    Time  8    Period  Weeks    Status  New    Target Date  06/02/19      PT LONG TERM GOAL #3   Title  Patient (< 25 years old) will complete five times sit to stand test in < 10 seconds indicating an increased LE strength and improved balance.    Baseline  requires 2HHA to push up from chair    Time  8    Period  Weeks    Status  New    Target Date  06/02/19      PT LONG TERM GOAL #4   Title  Patient will report decreased incidences of falls and stumbles to less than 1 a month to improve safety at home.     Baseline  2-4 instances of "close calls" has to grab mother to stabilize    Time  8    Period  Weeks    Status  New    Target Date  06/02/19            Plan -  04/28/19 1218    Clinical Impression Statement  Patient motivated and tolerated  session well. She exhibits improved strength with increased ability with HEP; PT advanced HEP with advanced strengthening and balance exercise to faciltate better gluteal and hamstring control for improved gait ability. Patient continues to have increased adductor tone and weakness in RLE limiting mobility; She would benefit from additional skilled PT intervention to improve strength, balance and gait safety;    Rehab Potential  Good    Clinical Impairments Affecting Rehab Potential  positive: motivated, good family support    PT Frequency  1x / week    PT Duration  8 weeks    PT Treatment/Interventions  Patient/family education;Manual techniques;Therapeutic activities;Therapeutic exercise;Electrical Stimulation;Aquatic Therapy;DME Instruction;Functional mobility training;Gait training;Neuromuscular re-education;ADLs/Self Care Home Management;Moist Heat;Stair training;Balance training;Orthotic Fit/Training    PT Next Visit Plan  walk aide with gait     PT Home Exercise Plan  Hooklying Bridge, Bilat hooklying clam c band, sidelying clam grav resist, tall kneeling balance, tall kneeling side step.   consider high and low adductor stretchs   Consulted and Agree with Plan of Care  Patient    Family Member Consulted  mom       Patient will benefit from skilled therapeutic intervention in order to improve the following deficits and impairments:  Abnormal gait, Decreased balance, Decreased endurance, Decreased mobility, Difficulty walking, Decreased range of motion, Decreased activity tolerance, Decreased safety awareness, Decreased strength  Visit Diagnosis: 1. Muscle weakness (generalized)   2. Unsteadiness on feet        Problem List Patient Active Problem List   Diagnosis Date Noted  . Loss of weight 07/17/2015  . Depression 07/17/2015  . Cerebral palsy (HCC) 07/17/2015    , PT, DPT 04/28/2019, 12:20 PM  [place] [facility] [address] [place], [place], [address] Phone: [phone]   Fax:  [phone]  Name: Renee Walton MRN: [id number] Date of Birth: 1995-11-18

## 2019-04-28 NOTE — Patient Instructions (Signed)
Access Code: [email or web address]  URL: [email or web address]  Date: 04/28/2019  Prepared by: Blanche East   Exercises  Supine Figure 4 Piriformis Stretch - 3 reps - 2 sets - 15 hold - 1x daily - 7x weekly  Figure 4 Bridge - 10 reps - 2 sets - 1x daily - 7x weekly  Heel Sits - 10 reps - 2 sets - 1x daily - 7x weekly  Kneeling Double Arm Raise - 10 reps - 3 sets - 1x daily - 7x weekly

## 2019-05-05 ENCOUNTER — Encounter: Payer: Self-pay | Admitting: Physical Therapy

## 2019-05-05 ENCOUNTER — Other Ambulatory Visit: Payer: Self-pay

## 2019-05-05 ENCOUNTER — Ambulatory Visit: Payer: Medicaid Other | Admitting: Physical Therapy

## 2019-05-05 DIAGNOSIS — R2681 Unsteadiness on feet: Secondary | ICD-10-CM

## 2019-05-05 DIAGNOSIS — M6281 Muscle weakness (generalized): Secondary | ICD-10-CM | POA: Diagnosis not present

## 2019-05-05 NOTE — Patient Instructions (Signed)
Access Code: [email or web address]  URL: [email or web address]  Date: 05/05/2019  Prepared by: Blanche East   Exercises  Seated Table Hamstring Stretch - 3 reps - 3 sets - 20 hold - 1x daily - 7x weekly  Prone Quadriceps Stretch with Strap - 3 reps - 3 sets - 20 hold - 1x daily - 7x weekly  Supine Hip Adductor Stretch - 3 reps - 3 sets - 20 hold - 1x daily - 7x weekly  Standing Bilateral Gastroc Stretch with Step - 3 reps - 3 sets - 20 hold - 1x daily - 7x weekly

## 2019-05-05 NOTE — Therapy (Signed)
[place] [facility] [address] [place], [place], [address] Phone: [phone]   Fax:  [phone]  Physical Therapy Treatment  Patient Details  Name: Renee Walton Jeffus MRN: [id number] Date of Birth: 03/07/1996 Referring Provider (PT): [place], [place]   Encounter Date: 05/05/2019  PT End of Session - 05/05/19 1152    Visit Number  5    Number of Visits  9    Date for PT Re-Evaluation  06/02/19    Authorization Type  medicaid, approved 8 visits 6/23-8/17    Authorization - Visit Number  5    Authorization - Number of Visits  8    PT Start Time  1146    PT Stop Time  1230    PT Time Calculation (min)  44 min    Equipment Utilized During Treatment  Gait belt    Activity Tolerance  Patient tolerated treatment well;No increased pain    Behavior During Therapy  WFL for tasks assessed/performed       Past Medical History:  Diagnosis Date  . Breakthrough bleeding   . Cerebral palsy (HCC)   . GAD (generalized anxiety disorder)   . Migraine headache     Past Surgical History:  Procedure Laterality Date  . LEG SURGERY     bilateral x2 in 2008    There were no vitals filed for this visit.  Subjective Assessment - 05/05/19 1149    Subjective  Patient reports doing well; no new falls; reports exercises have been doing well;    Patient is accompained by:  Family member    Pertinent History  pertinent factors affecting rehab: cerebral palsy, high fall risk; good family support; Patient is walking at home without AD; denies any new falls;    Limitations  Standing;Walking    How long can you stand comfortably?  5-15 min    How long can you walk comfortably?  30 min;     Diagnostic tests  none recent;     Patient Stated Goals  be more independent, walk without person assist,     Currently in Pain?  No/denies    Multiple Pain Sites  No             TREATMENT:  Re-assessed and advanced HEP:  Patient  hooklying: -bridges with arms across chest x20 reps with good hip ROM and motor control noted;  Advanced HEP with LE stretches: Long sitting hamstring stretch 20 sec hold x2 reps bilaterally using strap for ankle DF and mod VCs for correct positioning including to keep knee straight and to increase hip flexion for better stretch; Patient able to achieve good positioning with increased cues;  Patient prone: Quad stretch with strap for better ROM for better tissue extensibility; 20 sec hold x1 rep bilaterally; Required cues to avoid trunk rotation and to keep hip flat on table for better stretch;  Patient re-educated in hip adductor stretch with butterfly stretch in hooklying 20 sec hold x1 rep; Instructed patient in hooklying hip abduction with yellow tband x15 reps with mod VCs to increase ROM without arching back or compensating with accessory motion. She had moderate difficulty isolating gluteal activation due to weakness;  Patient sidelying: -clamshells without tband resistance x10 reps LLE with mod VCs for proper positioning including to avoid trunk rotation and to isolate hip abduction for better strengthening; Exhibits partial ROM due to weakness; patient does have difficulty avoiding trunk activation and isolating gluteal muscle activation;   Standing: Heel off  step calf stretch 20 sec hold x1 rep bilaterally; Patient required increased cues for correct positioning and weight shift for optimum stretch; She require min A for getting into correct position;  Attempted standing hip abduction but patient unable without heavy trunk lean due to weakness and poor gluteal activation; Attempted prone gluteal max hip extension but patient unable to isolate gluteal activation with heavy push through quad for increased hip extension;  Response to treatment: Patient tolerated fair. She was able to tolerate stretch well with good ROM and positioning; She does require increased cues for correct  exercise technique and position. Patient has significant difficulty achieving gluteal activation for hip abduction, often compensating with accessory muscle activation. She would benefit from additional skilled PT to address HEP for strengthening and mobility;                      PT Education - 05/05/19 1152    Education provided  Yes    Education Details  HEP reinforced, strengthening, walkaide;    Person(s) Educated  Patient;Parent(s)    Methods  Explanation;Demonstration;Verbal cues    Comprehension  Verbalized understanding;Returned demonstration;Verbal cues required;Need further instruction       PT Short Term Goals - 04/07/19 1312      PT SHORT TERM GOAL #1   Title  Patient and family members will be indpendent with HEP and understand they need to do the HEP at home.    Baseline  Was going to the gym but unable to go right now due to coronavirus;    Time  4    Period  Weeks    Status  New    Target Date  05/05/19        PT Long Term Goals - 04/07/19 1313      PT LONG TERM GOAL #1   Title  Patient will increase 10 meter walk test to >1.29m/s as to improve gait speed for better community ambulation and to reduce fall risk.    Baseline  04/06/19: 0.98 m/s without walkaide, 1.08 m/s with walkaide;    Time  8    Period  Weeks    Status  New    Target Date  06/02/19      PT LONG TERM GOAL #2   Title  Patient will be modified independent in walking on TM surface with walk aide for 20+ minutes without rest break, to improve walking tolerance and safety.     Baseline  able to walk 1.2-1.4 mph with 2 HHA for short distance;    Time  8    Period  Weeks    Status  New    Target Date  06/02/19      PT LONG TERM GOAL #3   Title  Patient (< 35 years old) will complete five times sit to stand test in < 10 seconds indicating an increased LE strength and improved balance.    Baseline  requires 2HHA to push up from chair    Time  8    Period  Weeks    Status   New    Target Date  06/02/19      PT LONG TERM GOAL #4   Title  Patient will report decreased incidences of falls and stumbles to less than 1 a month to improve safety at home.     Baseline  2-4 instances of "close calls" has to grab mother to stabilize    Time  8  Period  Weeks    Status  New    Target Date  06/02/19            Plan - 05/05/19 1502    Clinical Impression Statement  patient motivated and tolerated session well. She had increased difficulty achieving gluteal activation with prone exercise and standing hip activation. Patient instructed to focus on hooklying exercise to achieve good hip activation in gravity assisted for better strengthening. patient and caregiver educated on importance of correct exercise form for less compensation to achieve better hip strengthening. HEP advanced with LE stretches for better mobility. Patient would benefit from additional skilled PT intervention to improve strength, balance and gait safety; She is still wanting to get a walkaide device for better ambulation. She is just waiting on finances to work out.    Rehab Potential  Good    Clinical Impairments Affecting Rehab Potential  positive: motivated, good family support    PT Frequency  1x / week    PT Duration  8 weeks    PT Treatment/Interventions  Patient/family education;Manual techniques;Therapeutic activities;Therapeutic exercise;Electrical Stimulation;Aquatic Therapy;DME Instruction;Functional mobility training;Gait training;Neuromuscular re-education;ADLs/Self Care Home Management;Moist Heat;Stair training;Balance training;Orthotic Fit/Training    PT Next Visit Plan  walk aide with gait     PT Home Exercise Plan  Hooklying Bridge, Bilat hooklying clam c band, sidelying clam grav resist, tall kneeling balance, tall kneeling side step.   consider high and low adductor stretchs   Consulted and Agree with Plan of Care  Patient    Family Member Consulted  mom       Patient will  benefit from skilled therapeutic intervention in order to improve the following deficits and impairments:  Abnormal gait, Decreased balance, Decreased endurance, Decreased mobility, Difficulty walking, Decreased range of motion, Decreased activity tolerance, Decreased safety awareness, Decreased strength  Visit Diagnosis: 1. Muscle weakness (generalized)   2. Unsteadiness on feet        Problem List Patient Active Problem List   Diagnosis Date Noted  . Loss of weight 07/17/2015  . Depression 07/17/2015  . Cerebral palsy (HCC) 07/17/2015    , PT, DPT 05/05/2019, 3:06 PM  [place] [facility] [address] , [place], [address] Phone: [phone]   Fax:  [phone]  Name: Renee Walton MRN: [id number] Date of Birth: 06/23/1996

## 2019-05-11 ENCOUNTER — Ambulatory Visit: Payer: Medicaid Other | Admitting: Physical Therapy

## 2019-05-12 ENCOUNTER — Encounter: Payer: Self-pay | Admitting: Physical Therapy

## 2019-05-12 ENCOUNTER — Ambulatory Visit: Payer: Medicaid Other | Admitting: Physical Therapy

## 2019-05-12 ENCOUNTER — Other Ambulatory Visit: Payer: Self-pay

## 2019-05-12 DIAGNOSIS — R2681 Unsteadiness on feet: Secondary | ICD-10-CM

## 2019-05-12 DIAGNOSIS — M6281 Muscle weakness (generalized): Secondary | ICD-10-CM

## 2019-05-12 NOTE — Therapy (Signed)
[place] [facility] [address] [place], [place], [address] Phone: [phone]   Fax:  [phone]  Physical Therapy Treatment  Patient Details  Name: Renee BaldFatima Walton MRN: [id number] Date of Birth: 1996/03/07 Referring Provider (PT): [place], [place]   Encounter Date: 05/12/2019  PT End of Session - 05/12/19 [id number]    Visit Number  6    Number of Visits  9    Date for PT Re-Evaluation  06/02/19    Authorization Type  medicaid, approved 8 visits 6/23-8/17    Authorization - Visit Number  6    Authorization - Number of Visits  8    PT Start Time  0932    PT Stop Time  1015    PT Time Calculation (min)  43 min    Equipment Utilized During Treatment  Gait belt    Activity Tolerance  Patient tolerated treatment well;No increased pain    Behavior During Therapy  WFL for tasks assessed/performed       Past Medical History:  Diagnosis Date  . Breakthrough bleeding   . Cerebral palsy (HCC)   . GAD (generalized anxiety disorder)   . Migraine headache     Past Surgical History:  Procedure Laterality Date  . LEG SURGERY     bilateral x2 in 2008    There were no vitals filed for this visit.  Subjective Assessment - 05/12/19 0937    Subjective  Patient reports doing well; denies any falls; reports that she hasn't been able to do her exercises this past week due to doctor appointments.    Patient is accompained by:  Family member    Pertinent History  pertinent factors affecting rehab: cerebral palsy, high fall risk; good family support; Patient is walking at home without AD; denies any new falls;    Limitations  Standing;Walking    How long can you stand comfortably?  5-15 min    How long can you walk comfortably?  30 min;     Diagnostic tests  none recent;     Patient Stated Goals  be more independent, walk without person assist,     Currently in Pain?  No/denies    Multiple Pain Sites  No        TREATMENT: Warm up  on Nustep, BUE/BLE level 3 x4 min (Unbilled)  Patient instructed in advanced LE strengthening: Leg press: BLE: 95# x15 with small ball between knees for better quad control and to avoid valgus positioning; Required min Vcs to slow down LE movement including to slow down eccentric return for better strengthening; Leg press: Single leg:  Leg press: heel raises: 45# x10 with mod VCs and tactile cues for proper positioning and sequencing for optimum muscle activation;     Re-assessed and advanced HEP: Patient hooklying: -bridges with arms across chest x15 reps withgood hip ROM and motor control noted; -Single leg bridges in figure 4 position x10 reps each LE, decreased AROM on right side noted;    Patient prone: Quad stretch with strap for better ROM for better tissue extensibility; 20 sec hold x1 rep bilaterally; Required cues to avoid trunk rotation and to keep hip flat on table for better stretch;  Patient re-educated in hip adductor stretch with butterfly stretch in hooklying 20 sec hold x1 rep; Instructed patient in hooklying hip abduction with yellow tband x15 reps with mod VCs to increase ROM without arching back or compensating with accessory motion. She had moderate difficulty isolating gluteal  activation due to weakness;  Patient sidelying: -clamshells without tband resistance 2x10reps LLE with mod VCs for proper positioning including to avoid trunk rotation and to isolate hip abduction for better strengthening; Exhibits partial ROM due to weakness; patient does have difficulty avoiding trunk activation and isolating gluteal muscle activation;  Attempted RLE clamshells but patient unable to activate due to weakness; PT performed passive clamshell with eccentric lower AAROM 2x5 RLE only;   Response to treatment: Patient tolerated fair. She does require increased cues for correct exercise technique and position. Patient has significant difficulty achieving gluteal activation  for hip abduction, often compensating with accessory muscle activation. She would benefit from additional skilled PT to address HEP for strengthening and mobility;                       PT Education - 05/12/19 0938    Education provided  Yes    Education Details  HEP reinforced, strengthening/gait safety;    Person(s) Educated  Patient;Parent(s)    Methods  Explanation;Verbal cues    Comprehension  Verbalized understanding;Returned demonstration;Verbal cues required;Need further instruction       PT Short Term Goals - 04/07/19 1312      PT SHORT TERM GOAL #1   Title  Patient and family members will be indpendent with HEP and understand they need to do the HEP at home.    Baseline  Was going to the gym but unable to go right now due to coronavirus;    Time  4    Period  Weeks    Status  New    Target Date  05/05/19        PT Long Term Goals - 04/07/19 1313      PT LONG TERM GOAL #1   Title  Patient will increase 10 meter walk test to >1.69m/s as to improve gait speed for better community ambulation and to reduce fall risk.    Baseline  04/06/19: 0.98 m/s without walkaide, 1.08 m/s with walkaide;    Time  8    Period  Weeks    Status  New    Target Date  06/02/19      PT LONG TERM GOAL #2   Title  Patient will be modified independent in walking on TM surface with walk aide for 20+ minutes without rest break, to improve walking tolerance and safety.     Baseline  able to walk 1.2-1.4 mph with 2 HHA for short distance;    Time  8    Period  Weeks    Status  New    Target Date  06/02/19      PT LONG TERM GOAL #3   Title  Patient (< 33 years old) will complete five times sit to stand test in < 10 seconds indicating an increased LE strength and improved balance.    Baseline  requires 2HHA to push up from chair    Time  8    Period  Weeks    Status  New    Target Date  06/02/19      PT LONG TERM GOAL #4   Title  Patient will report decreased incidences  of falls and stumbles to less than 1 a month to improve safety at home.     Baseline  2-4 instances of "close calls" has to grab mother to stabilize    Time  8    Period  Weeks    Status  New  Target Date  06/02/19            Plan - 05/12/19 1205    Clinical Impression Statement  Patient motivated and tolerated session well. She continues to have a lot of difficulty activating gluteal muscles bilaterally. Patient instructed in advanced LE strengthening exercise. She requires increased cues for proper positioning and muscle activation. reinforced HEP; Patient and caregiver verbalized understanding. She is planning on getting walkaide soon.    Rehab Potential  Good    Clinical Impairments Affecting Rehab Potential  positive: motivated, good family support    PT Frequency  1x / week    PT Duration  8 weeks    PT Treatment/Interventions  Patient/family education;Manual techniques;Therapeutic activities;Therapeutic exercise;Electrical Stimulation;Aquatic Therapy;DME Instruction;Functional mobility training;Gait training;Neuromuscular re-education;ADLs/Self Care Home Management;Moist Heat;Stair training;Balance training;Orthotic Fit/Training    PT Next Visit Plan  walk aide with gait     PT Home Exercise Plan  Hooklying Bridge, Bilat hooklying clam c band, sidelying clam grav resist, tall kneeling balance, tall kneeling side step.   consider high and low adductor stretchs   Consulted and Agree with Plan of Care  Patient    Family Member Consulted  mom       Patient will benefit from skilled therapeutic intervention in order to improve the following deficits and impairments:  Abnormal gait, Decreased balance, Decreased endurance, Decreased mobility, Difficulty walking, Decreased range of motion, Decreased activity tolerance, Decreased safety awareness, Decreased strength  Visit Diagnosis: 1. Muscle weakness (generalized)   2. Unsteadiness on feet        Problem List Patient Active  Problem List   Diagnosis Date Noted  . Loss of weight 07/17/2015  . Depression 07/17/2015  . Cerebral palsy (HCC) 07/17/2015    Trotter,Margaret PT, DPT 05/12/2019, 12:50 PM  [facility] [address] [place], [place], [address] Phone: [phone]   Fax:  [phone]  Name: Renee BaldFatima Walton MRN: [id number] Date of Birth: 1996/03/10

## 2019-05-17 ENCOUNTER — Encounter: Payer: Self-pay | Admitting: Physical Therapy

## 2019-05-17 ENCOUNTER — Other Ambulatory Visit: Payer: Self-pay

## 2019-05-17 ENCOUNTER — Ambulatory Visit: Payer: Medicaid Other | Admitting: Physical Therapy

## 2019-05-17 DIAGNOSIS — M6281 Muscle weakness (generalized): Secondary | ICD-10-CM

## 2019-05-17 DIAGNOSIS — R2681 Unsteadiness on feet: Secondary | ICD-10-CM

## 2019-05-17 NOTE — Patient Instructions (Signed)
Access Code: [email or web address]  URL: [email or web address]  Date: 05/17/2019  Prepared by: Blanche East   Exercises  Standing with Head Rotation - 10 reps - 2 sets - 1x daily - 7x weekly  Step Up - 5 reps - 1 sets - 10 hold - 1x daily - 7x weekly  Sidestepping - 5 reps - 1 sets - 5 hold - 1x daily - 7x weekly

## 2019-05-17 NOTE — Therapy (Signed)
[facility] [place] [facility] [address] [place], [place], [address] Phone: [phone]   Fax:  [phone]  Physical Therapy Treatment  Patient Details  Name: Renee Walton Lamora MRN: [id number] Date of Birth: [birth year] Referring Provider (PT): [place], [place]   Encounter Date: 05/17/2019  PT End of Session - 05/17/19 1159    Visit Number  7    Number of Visits  9    Date for PT Re-Evaluation  06/02/19    Authorization Type  medicaid, approved 8 visits 6/23-8/17    Authorization - Visit Number  7    Authorization - Number of Visits  8    PT Start Time  1145    PT Stop Time  1230    PT Time Calculation (min)  45 min    Equipment Utilized During Treatment  Gait belt    Activity Tolerance  Patient tolerated treatment well;No increased pain    Behavior During Therapy  WFL for tasks assessed/performed       Past Medical History:  Diagnosis Date  . Breakthrough bleeding   . Cerebral palsy (HCC)   . GAD (generalized anxiety disorder)   . Migraine headache     Past Surgical History:  Procedure Laterality Date  . LEG SURGERY     bilateral x2 in 2008    There were no vitals filed for this visit.  Subjective Assessment - 05/17/19 1158    Subjective  Patient reports she is getting a psychological test and then will be assessed for home aide. She reports adherence with HEP; She denies any new falls;    Patient is accompained by:  Family member    Pertinent History  pertinent factors affecting rehab: cerebral palsy, high fall risk; good family support; Patient is walking at home without AD; denies any new falls;    Limitations  Standing;Walking    How long can you stand comfortably?  5-15 min    How long can you walk comfortably?  30 min;     Diagnostic tests  none recent;     Patient Stated Goals  be more independent, walk without person assist,     Currently in Pain?  No/denies    Multiple Pain Sites  No          TREATMENT: Warm up on Nustep BUE/BLE level 2 x4 min (Unbilled);  PT educated patient in news regarding walkaide device and how company has stopped manufacturing heel sensor. Orthotic Rep is going to try and find one but if they are unable to locate a heel sensor patient may be unable to get a walkaide as she is unable to utilize the tilt mode.   PT instructed patient in advanced balance exercise to challenge stance control:    Patient instructed in advanced balance exercise  Standing in parallel bars:  Standing on airex foam: -Standing one foot on airex, one foot on 4 inch step, unsupported 10 sec hold x2 sets each LE on step with min A progressing to CGA with cues for weight shift and to improve hip extension for better stance control; -feet apart, unsupported, head turns side/side, up/down x10 reps each with CGA for safety; Patient required min VCs for balance stability, including to increase trunk control for less loss of balance with smaller base of support  Multiple directional stepping side, forward, backward x3 sets each LE unsupported with CGA for safety; Patient required cues for weight shift for better dynamic balance control.   Advanced HEP,  see patient instructions. Patient and caregiver verbalized understanding;                     PT Education - 05/17/19 1158    Education provided  Yes    Education Details  HEP reinforced, strengthening/gait safety;    Person(s) Educated  Patient;Parent(s)    Methods  Explanation;Demonstration;Verbal cues    Comprehension  Verbalized understanding;Returned demonstration;Verbal cues required;Need further instruction       PT Short Term Goals - 04/07/19 1312      PT SHORT TERM GOAL #1   Title  Patient and family members will be indpendent with HEP and understand they need to do the HEP at home.    Baseline  Was going to the gym but unable to go right now due to coronavirus;    Time  4    Period  Weeks     Status  New    Target Date  05/05/19        PT Long Term Goals - 04/07/19 1313      PT LONG TERM GOAL #1   Title  Patient will increase 10 meter walk test to >1.18m/s as to improve gait speed for better community ambulation and to reduce fall risk.    Baseline  04/06/19: 0.98 m/s without walkaide, 1.08 m/s with walkaide;    Time  8    Period  Weeks    Status  New    Target Date  06/02/19      PT LONG TERM GOAL #2   Title  Patient will be modified independent in walking on TM surface with walk aide for 20+ minutes without rest break, to improve walking tolerance and safety.     Baseline  able to walk 1.2-1.4 mph with 2 HHA for short distance;    Time  8    Period  Weeks    Status  New    Target Date  06/02/19      PT LONG TERM GOAL #3   Title  Patient (< 25 years old) will complete five times sit to stand test in < 10 seconds indicating an increased LE strength and improved balance.    Baseline  requires 2HHA to push up from chair    Time  8    Period  Weeks    Status  New    Target Date  06/02/19      PT LONG TERM GOAL #4   Title  Patient will report decreased incidences of falls and stumbles to less than 1 a month to improve safety at home.     Baseline  2-4 instances of "close calls" has to grab mother to stabilize    Time  8    Period  Weeks    Status  New    Target Date  06/02/19            Plan - 05/17/19 1257    Clinical Impression Statement  Patient motivated and tolerated session well. Advanced HEP with instruction in balance exercise. patient required cues for proper positioning and weight shift for better stance control. She does have increased weakness in gluteal and hip musculature which limits stance control. She would benefit from additional skilled PT intervention to improve strength, balance and gait safety;    Rehab Potential  Good    Clinical Impairments Affecting Rehab Potential  positive: motivated, good family support    PT Frequency  1x / week  PT Duration  8 weeks    PT Treatment/Interventions  Patient/family education;Manual techniques;Therapeutic activities;Therapeutic exercise;Electrical Stimulation;Aquatic Therapy;DME Instruction;Functional mobility training;Gait training;Neuromuscular re-education;ADLs/Self Care Home Management;Moist Heat;Stair training;Balance training;Orthotic Fit/Training    PT Next Visit Plan  walk aide with gait     PT Home Exercise Plan  [place], [place] hooklying clam c band, sidelying clam grav resist, tall kneeling balance, tall kneeling side step.   consider high and low adductor stretchs   Consulted and Agree with Plan of Care  Patient    Family Member Consulted  mom       Patient will benefit from skilled therapeutic intervention in order to improve the following deficits and impairments:  Abnormal gait, Decreased balance, Decreased endurance, Decreased mobility, Difficulty walking, Decreased range of motion, Decreased activity tolerance, Decreased safety awareness, Decreased strength  Visit Diagnosis: 1. Muscle weakness (generalized)   2. Unsteadiness on feet        Problem List Patient Active Problem List   Diagnosis Date Noted  . Loss of weight 07/17/2015  . Depression 07/17/2015  . Cerebral palsy (HCC) 07/17/2015    Anyae Griffith,MargaretPT, DPT 05/17/2019, 12:59 PM  [place] [facility] [address] [place], [place], [address] Phone: [phone]   Fax:  [phone]  Name: Renee Walton Brackeen MRN: [id number] Date of Birth: 1996/07/24

## 2019-05-18 ENCOUNTER — Ambulatory Visit: Payer: Medicaid Other | Admitting: Physical Therapy

## 2019-05-25 ENCOUNTER — Ambulatory Visit: Payer: Medicaid Other | Attending: Family Medicine | Admitting: Physical Therapy

## 2019-05-25 ENCOUNTER — Other Ambulatory Visit: Payer: Self-pay

## 2019-05-25 ENCOUNTER — Encounter: Payer: Self-pay | Admitting: Physical Therapy

## 2019-05-25 DIAGNOSIS — M6281 Muscle weakness (generalized): Secondary | ICD-10-CM | POA: Diagnosis present

## 2019-05-25 DIAGNOSIS — G809 Cerebral palsy, unspecified: Secondary | ICD-10-CM | POA: Insufficient documentation

## 2019-05-25 DIAGNOSIS — R2681 Unsteadiness on feet: Secondary | ICD-10-CM | POA: Diagnosis present

## 2019-05-25 NOTE — Therapy (Signed)
[place] MAIN [facility] SERVICES [address] [place], [place], [address] Phone: [phone]   Fax:  [phone]  Physical Therapy Treatment  Patient Details  Name: Renee Walton MRN: [id number] Date of Birth: 17-Mar-1996 Referring Provider (PT): [place], [place]   Encounter Date: 05/25/2019  PT End of Session - 05/25/19 1031    Visit Number  8    Number of Visits  17    Date for PT Re-Evaluation  06/23/19    Authorization Type  medicaid, approved 8 visits 6/23-8/17    Authorization - Visit Number  8    Authorization - Number of Visits  8    PT Start Time  9021    PT Stop Time  1115    PT Time Calculation (min)  44 min    Equipment Utilized During Treatment  Gait belt    Activity Tolerance  Patient tolerated treatment well;No increased pain    Behavior During Therapy  WFL for tasks assessed/performed       Past Medical History:  Diagnosis Date  . Breakthrough bleeding   . Cerebral palsy ([place])   . GAD (generalized anxiety disorder)   . Migraine headache     Past Surgical History:  Procedure Laterality Date  . LEG SURGERY     bilateral x2 in 2008    There were no vitals filed for this visit.  Subjective Assessment - 05/25/19 1038    Subjective  Patient reports she was approved for home aide and will have help 4 days a week for 2 hours each day; She reports that will start next month; She denies any pain; Reports doing exercises every other day at home right now due to mother's work schedule;    Patient is accompained by:  Family member    Pertinent History  pertinent factors affecting rehab: cerebral palsy, high fall risk; good family support; Patient is walking at home without AD; denies any new falls;    Limitations  Standing;Walking    How long can you stand comfortably?  5-15 min    How long can you walk comfortably?  30 min;     Diagnostic tests  none recent;     Patient Stated Goals  be more independent, walk without  person assist,     Currently in Pain?  No/denies    Multiple Pain Sites  No         [facility] PT Assessment - 05/26/19 0001      Strength   Right Hip Flexion  3-/5    Right Hip Extension  3/5    Right Hip ABduction  2+/5    Right Hip ADduction  3+/5    Left Hip Flexion  3/5    Left Hip Extension  2+/5    Left Hip ABduction  3-/5    Left Hip ADduction  3/5    Right Knee Flexion  2+/5    Right Knee Extension  3-/5    Left Knee Flexion  4-/5    Left Knee Extension  3+/5      Standardized Balance Assessment   Five times sit to stand comments   11.2 sec without HHA (>10 sec indicates high fall risk, improved from 04/07/19 which patient required 2 HHA to push up from chair    10 Meter Walk  1.11 m/s without walkaide, without AD, exhibits reciprocal gait, increased right toe out, increased lateral trunk lean due to weakness in hip abductors  TREATMENT: Warm up on treadmill 1.2 mph with 2 HHA x3 min with heavy foot drag on RLE and decreased step length bilaterally with increased lateral trunk lean; Patient reports increased fatigue and admits that she has had a harder time walking as she can't walk outside due to increased heat and doesn't have access to gym due to coronavirus lockdown;  PT instructed patient in 10 meter walk, 5 times sit<>Stand, and assessed strength to address goals, see above;  Patient instructed in resisted walking 7.5# forward/backward x2 laps with mod A and mod Vcs for weight shift and to increase step length for better gait safety; She had increased difficulty clearing right foot with decreased step length especially with posterior walking; She exhibits increased lateral lean due to weakness in hips and decreased trunk control;  Response to treatment: Patient is progressing well. She does continue to have significant weakness in BLE although has made some improvements in hip and knee strength in last month. Patient does require min-mod VCs for proper exercise  technique including proper positioning for optimal muscle activation. She often compensates with increased trunk activation due to weakness in proximal hip musculature. Advanced dynamic balance exercise with resisted walking. Patient had increased difficulty with decreased step length and decreased weight shift; Patient's condition has the potential to improve in response to therapy. Maximum improvement is yet to be obtained. The anticipated improvement is attainable and reasonable in a generally predictable time.  Patient reports adherence with HEP but states its been difficult with not having access to the gym.                        PT Education - 05/25/19 1031    Education provided  Yes    Education Details  HEP reinforced, strengthening/gait safety;    Person(s) Educated  Patient;Parent(s)    Methods  Explanation;Demonstration;Verbal cues    Comprehension  Verbalized understanding;Returned demonstration;Verbal cues required;Need further instruction       PT Short Term Goals - 05/25/19 1031      PT SHORT TERM GOAL #1   Title  Patient and family members will be indpendent with HEP and understand they need to do the HEP at home.    Baseline  Was going to the gym but unable to go right now due to coronavirus; 05/25/19- reports adherence with HEP, mom has been helping;    Time  4    Period  Weeks    Status  Achieved    Target Date  05/05/19        PT Long Term Goals - 05/25/19 1032      PT LONG TERM GOAL #1   Title  Patient will increase 10 meter walk test to >1.50ms as to improve gait speed for better community ambulation and to reduce fall risk.    Baseline  04/06/19: 0.98 m/s without walkaide, 1.08 m/s with walkaide; 8/4: 1.11 m/s without AD    Time  4    Period  Weeks    Status  Achieved    Target Date  06/22/19      PT LONG TERM GOAL #2   Title  Patient will be modified independent in walking on TM surface with walk aide for 20+ minutes without rest break, to  improve walking tolerance and safety.     Baseline  able to walk 1.2-1.4 mph with 2 HHA for short distance; 8/4: able to walk at 1.2 mph without walkaide with heavy foot drag  for approximately 3-4 min with fatigue;    Time  4    Period  Weeks    Status  Partially Met    Target Date  06/22/19      PT LONG TERM GOAL #3   Title  Patient (< 64 years old) will complete five times sit to stand test in < 10 seconds indicating an increased LE strength and improved balance.    Baseline  requires 2HHA to push up from chair; 8/4: 11.2 sec without HHA    Time  4    Period  Weeks    Status  Partially Met    Target Date  06/22/19      PT LONG TERM GOAL #4   Title  Patient will report decreased incidences of falls and stumbles to less than 1 a month to improve safety at home.     Baseline  2-4 instances of "close calls" has to grab mother to stabilize; 8/4: 3-4 x a month requiring HHA to avoid falls;    Time  4    Period  Weeks    Status  Not Met    Target Date  06/22/19      PT LONG TERM GOAL #5   Title  Patient will increase BLE gross strength to 3/5 hip abduction  as to improve functional strength for independent gait, increased standing tolerance and increased ADL ability.    Baseline  R: 2+/5, L: 3-/5    Time  4    Period  Weeks    Status  New    Target Date  06/23/19            Plan - 05/26/19 1022    Clinical Impression Statement  Patient motivated and participated well within session. PT assessed goals this visit to address progress. She does exhibit improvement in gait speed although has considerable gait deviations including increased toe out, increased lateral trunk lean due to weakness in BLE. She is trying to get a walkaide device to assist with foot clearance but is waiting on certain parts to become available. Patient will need the heel sensor and the walkaide rep is looking for one to fit patient. Patient has been unable to go to gym for exercise due to current coronavirus  pandemic. Patient continues to have significant weakness in BLE which limits mobility. Recommend patient continue with PT and increase to 2x a week in order to take advantage of equipment for resistance and balance training while patient continues doing exercise at home. Utilizing treadmill for cardiovascular conditioning will significantly improve gait training while utilizing some of the resistance equipment for strengthening should also improve motor control and strength. Patient does not have access to this equipment at this time due to the pandemic. She also requires skilled PT intervention for proper exercise technique and form to isolate appropriate muscle group as patient has increased tone and often compensates with improper muscle activation. She would benefit from additional skilled PT intervention to improve strength, balance and mobility;    Rehab Potential  Good    Clinical Impairments Affecting Rehab Potential  positive: motivated, good family support    PT Frequency  2x / week    PT Duration  4 weeks    PT Treatment/Interventions  Patient/family education;Manual techniques;Therapeutic activities;Therapeutic exercise;Electrical Stimulation;Aquatic Therapy;DME Instruction;Functional mobility training;Gait training;Neuromuscular re-education;ADLs/Self Care Home Management;Moist Heat;Stair training;Balance training;Orthotic Fit/Training    PT Next Visit Plan  walk aide with gait     PT Home Exercise Plan  --  consider high and low adductor stretchs   Consulted and Agree with Plan of Care  Patient    Family Member Consulted  mom       Patient will benefit from skilled therapeutic intervention in order to improve the following deficits and impairments:  Abnormal gait, Decreased balance, Decreased endurance, Decreased mobility, Difficulty walking, Decreased range of motion, Decreased activity tolerance, Decreased safety awareness, Decreased strength  Visit Diagnosis: 1. Muscle weakness  (generalized)   2. Unsteadiness on feet        Problem List Patient Active Problem List   Diagnosis Date Noted  . Loss of weight 07/17/2015  . Depression 07/17/2015  . Cerebral palsy ([place]) 07/17/2015    Stephonie Wilcoxen PT, DPT 05/26/2019, 10:33 AM  [place] MAIN [facility] SERVICES [address] [place], [place], [address] Phone: [phone]   Fax:  [phone]  Name: Renee Walton MRN: [id number] Date of Birth: August 15, 1996

## 2019-06-01 ENCOUNTER — Other Ambulatory Visit: Payer: Self-pay

## 2019-06-01 ENCOUNTER — Encounter: Payer: Self-pay | Admitting: Physical Therapy

## 2019-06-01 ENCOUNTER — Ambulatory Visit: Payer: Medicaid Other | Admitting: Physical Therapy

## 2019-06-01 DIAGNOSIS — R2681 Unsteadiness on feet: Secondary | ICD-10-CM

## 2019-06-01 DIAGNOSIS — M6281 Muscle weakness (generalized): Secondary | ICD-10-CM

## 2019-06-01 NOTE — Therapy (Signed)
[place] MAIN [facility] SERVICES [address] [place], [place], [address] Phone: [phone]   Fax:  [phone]  Physical Therapy Treatment  Patient Details  Name: Renee Walton MRN: [id number] Date of Birth: February 08, 1996 Referring Provider (PT): [place], [place]   Encounter Date: 06/01/2019  PT End of Session - 06/01/19 1126    Visit Number  9    Number of Visits  17    Date for PT Re-Evaluation  06/23/19    Authorization Type  medicaid, approved 8 visits 6/23-8/17    Equipment Utilized During Treatment  Gait belt    Activity Tolerance  Patient tolerated treatment well;No increased pain    Behavior During Therapy  WFL for tasks assessed/performed       Past Medical History:  Diagnosis Date  . Breakthrough bleeding   . Cerebral palsy ([place])   . GAD (generalized anxiety disorder)   . Migraine headache     Past Surgical History:  Procedure Laterality Date  . LEG SURGERY     bilateral x2 in 2008    There were no vitals filed for this visit.  Subjective Assessment - 06/01/19 1124    Subjective  Patient reports still having challenge with HEP; She reports 2 new falls with occassional loss of balance in last week; Reports adherence with HEP;    Patient is accompained by:  Family member    Pertinent History  pertinent factors affecting rehab: cerebral palsy, high fall risk; good family support; Patient is walking at home without AD; denies any new falls;    Limitations  Standing;Walking    How long can you stand comfortably?  5-15 min    How long can you walk comfortably?  30 min;     Diagnostic tests  none recent;     Patient Stated Goals  be more independent, walk without person assist,     Currently in Pain?  No/denies    Multiple Pain Sites  No         TREATMENT: Warm up on treadmill 1.5 mph with 2 HHA x5 min x2 sets with heavy foot drag on RLE and decreased step length bilaterally with increased lateral trunk lean; Patient  exhibits increased toe out with occasional scissor steps; This is likely related to poor gluteal control and weakness in hips for foot advancement; She also exhibits increased shortness of breath with prolonged ambulation, vitals monitored: HR: 160's, SPO2 99%  Leg press: BLE plate 90# 6V89 with small ball between knees for better motor control to avoid knee valgus position and improve hip stabilization;  BLE heel raises 60# x10 with max Verbal and tactile cues for proper exercise technique to isolate calf activation;    Bridges with arms across chest x15 reps; Progressed to bridges with 2# bar lift x15 reps; Patient required min-moderate verbal/tactile cues for correct exercise technique including cues to keep hips even for better gluteal stabilization;  Tall kneeling: Tall kneeling on mat with BUE wand flexion 2# x10 reps with min A for balance with mod VCs to increase gluteal activation for better hip stabilization;  Squat to kneel and then up to tall kneeling with 2# chest press x15 reps with min A for balance; Patient exhibits decreased hip extension into tall kneeling due to weakness in gluteal muscles;  Patient fatigues quickly with prolonged kneeling reporting increased soreness in low back/posterior hips;  Response to treatment: Patient is progressing well. She does continue to have significant weakness in BLE although has  made some improvements in hip and knee strength in last month. Patient does require min-mod VCs for proper exercise technique including proper positioning for optimal muscle activation. She often compensates with increased trunk activation due to weakness in proximal hip musculature. Reinforced HEP with instruction to continue to work on kneeling and postural control for better gluteal activation;                       PT Education - 06/01/19 1125    Education provided  Yes    Education Details  HEP reinforced balance/strengthening; gait safety;     Person(s) Educated  Patient    Methods  Explanation;Verbal cues    Comprehension  Verbalized understanding;Returned demonstration;Verbal cues required;Need further instruction       PT Short Term Goals - 05/25/19 1031      PT SHORT TERM GOAL #1   Title  Patient and family members will be indpendent with HEP and understand they need to do the HEP at home.    Baseline  Was going to the gym but unable to go right now due to coronavirus; 05/25/19- reports adherence with HEP, mom has been helping;    Time  4    Period  Weeks    Status  Achieved    Target Date  05/05/19        PT Long Term Goals - 05/25/19 1032      PT LONG TERM GOAL #1   Title  Patient will increase 10 meter walk test to >1.22ms as to improve gait speed for better community ambulation and to reduce fall risk.    Baseline  04/06/19: 0.98 m/s without walkaide, 1.08 m/s with walkaide; 8/4: 1.11 m/s without AD    Time  4    Period  Weeks    Status  Achieved    Target Date  06/22/19      PT LONG TERM GOAL #2   Title  Patient will be modified independent in walking on TM surface with walk aide for 20+ minutes without rest break, to improve walking tolerance and safety.     Baseline  able to walk 1.2-1.4 mph with 2 HHA for short distance; 8/4: able to walk at 1.2 mph without walkaide with heavy foot drag for approximately 3-4 min with fatigue;    Time  4    Period  Weeks    Status  Partially Met    Target Date  06/22/19      PT LONG TERM GOAL #3   Title  Patient (< 668years old) will complete five times sit to stand test in < 10 seconds indicating an increased LE strength and improved balance.    Baseline  requires 2HHA to push up from chair; 8/4: 11.2 sec without HHA    Time  4    Period  Weeks    Status  Partially Met    Target Date  06/22/19      PT LONG TERM GOAL #4   Title  Patient will report decreased incidences of falls and stumbles to less than 1 a month to improve safety at home.     Baseline  2-4  instances of "close calls" has to grab mother to stabilize; 8/4: 3-4 x a month requiring HHA to avoid falls;    Time  4    Period  Weeks    Status  Not Met    Target Date  06/22/19  PT LONG TERM GOAL #5   Title  Patient will increase BLE gross strength to 3/5 hip abduction  as to improve functional strength for independent gait, increased standing tolerance and increased ADL ability.    Baseline  R: 2+/5, L: 3-/5    Time  4    Period  Weeks    Status  New    Target Date  06/23/19            Plan - 06/01/19 1218    Clinical Impression Statement  Pateint motivated and participated well within session. She does fatigue quickly with exercise with increased shortness of breath; vitals monitored with increased HR in 160's likely due to deconditioning. Patient responded well to cues but continues to have difficulty achieving proper muscle activation due to severe weakness and tone in BLE. She would benefit from additional skilled PT Intervention to improve strength, balance and gait safety;    Rehab Potential  Good    Clinical Impairments Affecting Rehab Potential  positive: motivated, good family support    PT Frequency  2x / week    PT Duration  4 weeks    PT Treatment/Interventions  Patient/family education;Manual techniques;Therapeutic activities;Therapeutic exercise;Electrical Stimulation;Aquatic Therapy;DME Instruction;Functional mobility training;Gait training;Neuromuscular re-education;ADLs/Self Care Home Management;Moist Heat;Stair training;Balance training;Orthotic Fit/Training    PT Next Visit Plan  walk aide with gait     PT Home Exercise Plan  --   consider high and low adductor stretchs   Consulted and Agree with Plan of Care  Patient    Family Member Consulted  mom       Patient will benefit from skilled therapeutic intervention in order to improve the following deficits and impairments:  Abnormal gait, Decreased balance, Decreased endurance, Decreased mobility,  Difficulty walking, Decreased range of motion, Decreased activity tolerance, Decreased safety awareness, Decreased strength  Visit Diagnosis: 1. Muscle weakness (generalized)   2. Unsteadiness on feet        Problem List Patient Active Problem List   Diagnosis Date Noted  . Loss of weight 07/17/2015  . Depression 07/17/2015  . Cerebral palsy ([place]) 07/17/2015    Yareth Kearse PT, DPT 06/01/2019, 12:20 PM  [place] MAIN [facility] SERVICES [address] [place], [place], [address] Phone: [phone]   Fax:  [phone]  Name: Renee Walton MRN: [id number] Date of Birth: [birth year]

## 2019-06-07 ENCOUNTER — Ambulatory Visit: Payer: Medicaid Other | Admitting: Physical Therapy

## 2019-06-08 ENCOUNTER — Encounter: Payer: Self-pay | Admitting: Physical Therapy

## 2019-06-08 ENCOUNTER — Ambulatory Visit: Payer: Medicaid Other | Admitting: Physical Therapy

## 2019-06-08 ENCOUNTER — Other Ambulatory Visit: Payer: Self-pay

## 2019-06-08 DIAGNOSIS — M6281 Muscle weakness (generalized): Secondary | ICD-10-CM | POA: Diagnosis not present

## 2019-06-08 DIAGNOSIS — R2681 Unsteadiness on feet: Secondary | ICD-10-CM

## 2019-06-08 NOTE — Therapy (Signed)
[place] MAIN [facility] SERVICES [address] [place], [place], [address] Phone: [phone]   Fax:  [phone]  Physical Therapy Treatment Physical Therapy Progress Note   Dates of reporting period  05/25/19  to   06/08/19   Patient Details  Name: Renee Walton MRN: [id number] Date of Birth: [birth year] Referring Provider (PT): [place], [place]   Encounter Date: 06/08/2019  PT End of Session - 06/08/19 1155    Visit Number  10    Number of Visits  17    Date for PT Re-Evaluation  06/23/19    Authorization Type  08/18-09/14    Authorization - Visit Number  1    Authorization - Number of Visits  8    PT Start Time  7169    PT Stop Time  1230    PT Time Calculation (min)  42 min    Equipment Utilized During Treatment  Gait belt    Activity Tolerance  Patient tolerated treatment well;No increased pain    Behavior During Therapy  WFL for tasks assessed/performed       Past Medical History:  Diagnosis Date  . Breakthrough bleeding   . Cerebral palsy ([place])   . GAD (generalized anxiety disorder)   . Migraine headache     Past Surgical History:  Procedure Laterality Date  . LEG SURGERY     bilateral x2 in 2008    There were no vitals filed for this visit.  Subjective Assessment - 06/08/19 1153    Subjective  Patient reports increased fatigue after last session; She reports getting approval for the home aide but isn't sure when she will start, "Maybe in 2 weeks."    Patient is accompained by:  Family member    Pertinent History  pertinent factors affecting rehab: cerebral palsy, high fall risk; good family support; Patient is walking at home without AD; denies any new falls;    Limitations  Standing;Walking    How long can you stand comfortably?  5-15 min    How long can you walk comfortably?  30 min;     Diagnostic tests  none recent;     Patient Stated Goals  be more independent, walk without person assist,     Currently in  Pain?  No/denies    Multiple Pain Sites  No         TREATMENT: Warm up on treadmill 1.5 mph with 2 HHA x5 min x2 sets with heavy foot drag on RLE and decreased step length bilaterally with increased lateral trunk lean;Patient exhibits increased toe out with occasional scissor steps; This is likely related to poor gluteal control and weakness in hips for foot advancement; She also exhibits increased shortness of breath with prolonged ambulation, vitals monitored: HR: 160's, SPO2 99%  Leg press: BLE plate 90# 6V89 with small ball between knees for better motor control to avoid knee valgus position and improve hip stabilization;    Instructed patient in balance exercise:  Standing on airex: -feet together, arms across chest, side/side trunk rotation x10 reps with mod VCs for erect posture and to improve gluteal activation for less trendelenburg and better hip control/trunk control; -one foot on airex, one foot on step with BUE wand flexion 3# x10 reps each foot on step; patient exhibit increased hip/knee flexion requiring cues for erect posture and to increase gluteal activation for better stance control; -staggered stance: head turns side/side x5 reps unsupported x1 set each foot in front with min A  and mod VCs to slow down head turns and improve gaze stabilization for better stance control;   Response to treatment: Patient is progressing well. She does continue to have significant weakness in BLE although has made some improvements in hip and knee strength in last month. Patient does require min-mod VCs for proper exercise technique including proper positioning for optimal muscle activation. She often compensates with increased trunk activation due to weakness in proximal hip musculature. Reinforced HEP with instruction to continue to work on kneeling and postural control for better gluteal activation; Goal were recently assessed on 05/25/19, please refer to outcome measures done that date;  Patient's condition has the potential to improve in response to therapy. Maximum improvement is yet to be obtained. The anticipated improvement is attainable and reasonable in a generally predictable time.  Patient reports feeling well and has been adherent with HEP; She denies any recent falls;                         PT Education - 06/08/19 1155    Education provided  Yes    Education Details  HEP reinforced, balance/strengthening/gait safety;    Person(s) Educated  Patient    Methods  Explanation;Verbal cues    Comprehension  Verbalized understanding;Returned demonstration;Verbal cues required;Need further instruction       PT Short Term Goals - 05/25/19 1031      PT SHORT TERM GOAL #1   Title  Patient and family members will be indpendent with HEP and understand they need to do the HEP at home.    Baseline  Was going to the gym but unable to go right now due to coronavirus; 05/25/19- reports adherence with HEP, mom has been helping;    Time  4    Period  Weeks    Status  Achieved    Target Date  05/05/19        PT Long Term Goals - 05/25/19 1032      PT LONG TERM GOAL #1   Title  Patient will increase 10 meter walk test to >1.67ms as to improve gait speed for better community ambulation and to reduce fall risk.    Baseline  04/06/19: 0.98 m/s without walkaide, 1.08 m/s with walkaide; 8/4: 1.11 m/s without AD    Time  4    Period  Weeks    Status  Achieved    Target Date  06/22/19      PT LONG TERM GOAL #2   Title  Patient will be modified independent in walking on TM surface with walk aide for 20+ minutes without rest break, to improve walking tolerance and safety.     Baseline  able to walk 1.2-1.4 mph with 2 HHA for short distance; 8/4: able to walk at 1.2 mph without walkaide with heavy foot drag for approximately 3-4 min with fatigue;    Time  4    Period  Weeks    Status  Partially Met    Target Date  06/22/19      PT LONG TERM GOAL #3   Title   Patient (< 89years old) will complete five times sit to stand test in < 10 seconds indicating an increased LE strength and improved balance.    Baseline  requires 2HHA to push up from chair; 8/4: 11.2 sec without HHA    Time  4    Period  Weeks    Status  Partially Met  Target Date  06/22/19      PT LONG TERM GOAL #4   Title  Patient will report decreased incidences of falls and stumbles to less than 1 a month to improve safety at home.     Baseline  2-4 instances of "close calls" has to grab mother to stabilize; 8/4: 3-4 x a month requiring HHA to avoid falls;    Time  4    Period  Weeks    Status  Not Met    Target Date  06/22/19      PT LONG TERM GOAL #5   Title  Patient will increase BLE gross strength to 3/5 hip abduction  as to improve functional strength for independent gait, increased standing tolerance and increased ADL ability.    Baseline  R: 2+/5, L: 3-/5    Time  4    Period  Weeks    Status  New    Target Date  06/23/19            Plan - 06/08/19 1237    Clinical Impression Statement  Patient motivated and participated well within session; Patient continues to have increased gluteal weakness which affects standing balance and posture. Patient does fatigue with prolonged standing, exhibiting increased compensation with less trunk control. Reinforced importance of HEP adherence working on kneeling to facilitate better gluteal activation; patient would benefit from additional skilled PT intervention to improve strength, balance and gait safety;    Rehab Potential  Good    Clinical Impairments Affecting Rehab Potential  positive: motivated, good family support    PT Frequency  2x / week    PT Duration  4 weeks    PT Treatment/Interventions  Patient/family education;Manual techniques;Therapeutic activities;Therapeutic exercise;Electrical Stimulation;Aquatic Therapy;DME Instruction;Functional mobility training;Gait training;Neuromuscular re-education;ADLs/Self Care Home  Management;Moist Heat;Stair training;Balance training;Orthotic Fit/Training    PT Next Visit Plan  walk aide with gait     PT Home Exercise Plan  --   consider high and low adductor stretchs   Consulted and Agree with Plan of Care  Patient    Family Member Consulted  mom       Patient will benefit from skilled therapeutic intervention in order to improve the following deficits and impairments:  Abnormal gait, Decreased balance, Decreased endurance, Decreased mobility, Difficulty walking, Decreased range of motion, Decreased activity tolerance, Decreased safety awareness, Decreased strength  Visit Diagnosis: 1. Muscle weakness (generalized)   2. Unsteadiness on feet        Problem List Patient Active Problem List   Diagnosis Date Noted  . Loss of weight 07/17/2015  . Depression 07/17/2015  . Cerebral palsy ([place]) 07/17/2015    Renee Walton PT, DPT 06/08/2019, 12:40 PM  [place] MAIN [facility] SERVICES [address] [place], [place], [address] Phone: [phone]   Fax:  [phone]  Name: Renee Walton MRN: [id number] Date of Birth: [birth year]

## 2019-06-10 ENCOUNTER — Ambulatory Visit: Payer: Medicaid Other | Admitting: Physical Therapy

## 2019-06-14 ENCOUNTER — Other Ambulatory Visit: Payer: Self-pay

## 2019-06-14 ENCOUNTER — Ambulatory Visit: Payer: Medicaid Other | Admitting: Physical Therapy

## 2019-06-14 ENCOUNTER — Encounter: Payer: Self-pay | Admitting: Physical Therapy

## 2019-06-14 DIAGNOSIS — R2681 Unsteadiness on feet: Secondary | ICD-10-CM

## 2019-06-14 DIAGNOSIS — M6281 Muscle weakness (generalized): Secondary | ICD-10-CM

## 2019-06-14 NOTE — Therapy (Signed)
[place] MAIN [facility] SERVICES [address] [place], [place], [address] Phone: [phone]   Fax:  [phone]  Physical Therapy Treatment  Patient Details  Name: Renee Walton MRN: [id number] Date of Birth: 01/07/1996 Referring Provider (PT): [place], [place]   Encounter Date: 06/14/2019  PT End of Session - 06/14/19 1109    Visit Number  11    Number of Visits  17    Date for PT Re-Evaluation  06/23/19    Authorization Type  08/18-09/14    Authorization - Visit Number  2    Authorization - Number of Visits  8    PT Start Time  9563    PT Stop Time  1145    PT Time Calculation (min)  43 min    Equipment Utilized During Treatment  Gait belt    Activity Tolerance  Patient tolerated treatment well;No increased pain    Behavior During Therapy  WFL for tasks assessed/performed       Past Medical History:  Diagnosis Date  . Breakthrough bleeding   . Cerebral palsy ([place])   . GAD (generalized anxiety disorder)   . Migraine headache     Past Surgical History:  Procedure Laterality Date  . LEG SURGERY     bilateral x2 in 2008    There were no vitals filed for this visit.  Subjective Assessment - 06/14/19 1108    Subjective  Patient reports feeling good today; She reports not doing her exercises over the weekend as she was on her period and was uncomfortable. Denies any new falls; She is supposed to hear about the home aide in the next 2 days;    Patient is accompained by:  Family member    Pertinent History  pertinent factors affecting rehab: cerebral palsy, high fall risk; good family support; Patient is walking at home without AD; denies any new falls;    Limitations  Standing;Walking    How long can you stand comfortably?  5-15 min    How long can you walk comfortably?  30 min;     Diagnostic tests  none recent;     Patient Stated Goals  be more independent, walk without person assist,     Currently in Pain?  No/denies           TREATMENT: Warm up on treadmill 1.8 mph with 2 HHA x5 min x2 sets with heavy foot drag on RLE and decreased step length bilaterally with increased lateral trunk lean; Patient exhibits increased toe out with occasional scissor steps; This is likely related to poor gluteal control and weakness in hips for foot advancement; She also exhibits increased shortness of breath with prolonged ambulation, vitals monitored: HR: 185's, SPO2 98%   Leg press: BLE plate 90# [id number] with small ball between knees for better motor control to avoid knee valgus position and improve hip stabilization;    Seated on pball: -sit<>stand unsupported x10 reps with arms across chest to challenge balance and LE motor control; therapist holding pball for better balance; Instructed patient to keep knees apart for better quad control and to increase hip extension upon standing for better gluteal activation, Required CGA for safety;  -walking forward with back on pball, bridges into table top position:  -mod VCs for gluteal activation with wand chest press x10 reps; Required min-mod A for balance and cues for proper positioning; patient unable to achieve full table top position due to weakness in gluteal;  Seated in semi-reclined position  on pball: with ball rotation side/side for core activation x10 reps each direction; Required mod VCs to increase core stabilization to avoid loss of balance and to improve upper trunk control;      Response to treatment: Patient continues to exhibit increased foot drag with ambulation with gait compensations of increased lateral trunk lean and hip ER due to weakness in gluteal and hip musculature. Progressed strengthening utilizing pball for increased balance/core strengthening; Patient does require min-mod VCs for proper exercise technique; She reports moderate fatigue at end of session; She does exhibit increased foot drag towards end of session due to fatigue;                   PT Education - 06/14/19 1109    Education provided  Yes    Education Details  LE strengthening, balance/gait safety; HEP    Person(s) Educated  Patient    Methods  Explanation;Verbal cues    Comprehension  Verbalized understanding;Returned demonstration;Verbal cues required;Need further instruction       PT Short Term Goals - 05/25/19 1031      PT SHORT TERM GOAL #1   Title  Patient and family members will be indpendent with HEP and understand they need to do the HEP at home.    Baseline  Was going to the gym but unable to go right now due to coronavirus; 05/25/19- reports adherence with HEP, mom has been helping;    Time  4    Period  Weeks    Status  Achieved    Target Date  05/05/19        PT Long Term Goals - 05/25/19 1032      PT LONG TERM GOAL #1   Title  Patient will increase 10 meter walk test to >1.24ms as to improve gait speed for better community ambulation and to reduce fall risk.    Baseline  04/06/19: 0.98 m/s without walkaide, 1.08 m/s with walkaide; 8/4: 1.11 m/s without AD    Time  4    Period  Weeks    Status  Achieved    Target Date  06/22/19      PT LONG TERM GOAL #2   Title  Patient will be modified independent in walking on TM surface with walk aide for 20+ minutes without rest break, to improve walking tolerance and safety.     Baseline  able to walk 1.2-1.4 mph with 2 HHA for short distance; 8/4: able to walk at 1.2 mph without walkaide with heavy foot drag for approximately 3-4 min with fatigue;    Time  4    Period  Weeks    Status  Partially Met    Target Date  06/22/19      PT LONG TERM GOAL #3   Title  Patient (< 67years old) will complete five times sit to stand test in < 10 seconds indicating an increased LE strength and improved balance.    Baseline  requires 2HHA to push up from chair; 8/4: 11.2 sec without HHA    Time  4    Period  Weeks    Status  Partially Met    Target Date  06/22/19      PT LONG TERM  GOAL #4   Title  Patient will report decreased incidences of falls and stumbles to less than 1 a month to improve safety at home.     Baseline  2-4 instances of "close calls" has to grab mother to  stabilize; 8/4: 3-4 x a month requiring HHA to avoid falls;    Time  4    Period  Weeks    Status  Not Met    Target Date  06/22/19      PT LONG TERM GOAL #5   Title  Patient will increase BLE gross strength to 3/5 hip abduction  as to improve functional strength for independent gait, increased standing tolerance and increased ADL ability.    Baseline  R: 2+/5, L: 3-/5    Time  4    Period  Weeks    Status  New    Target Date  06/23/19            Plan - 06/14/19 1149    Clinical Impression Statement  Patient motivated and participated well within session; She does fatigue with advanced exercise. Patient continues to have significant weakness in proximal hip musculature which limits gait safety, decreased stabilization and positioning on pball. Patient able to exhibit good cardiovascular challenge with gait on treadmill with HR in 170-180's with increased speed. Patient would benefit from additional skilled PT intervention to improve strength, balance and gait safety;    Rehab Potential  Good    Clinical Impairments Affecting Rehab Potential  positive: motivated, good family support    PT Frequency  2x / week    PT Duration  4 weeks    PT Treatment/Interventions  Patient/family education;Manual techniques;Therapeutic activities;Therapeutic exercise;Electrical Stimulation;Aquatic Therapy;DME Instruction;Functional mobility training;Gait training;Neuromuscular re-education;ADLs/Self Care Home Management;Moist Heat;Stair training;Balance training;Orthotic Fit/Training    PT Next Visit Plan  walk aide with gait     PT Home Exercise Plan  --   consider high and low adductor stretchs   Consulted and Agree with Plan of Care  Patient    Family Member Consulted  mom       Patient will benefit  from skilled therapeutic intervention in order to improve the following deficits and impairments:  Abnormal gait, Decreased balance, Decreased endurance, Decreased mobility, Difficulty walking, Decreased range of motion, Decreased activity tolerance, Decreased safety awareness, Decreased strength  Visit Diagnosis: Muscle weakness (generalized)  Unsteadiness on feet     Problem List Patient Active Problem List   Diagnosis Date Noted  . Loss of weight 07/17/2015  . Depression 07/17/2015  . Cerebral palsy ([place]) 07/17/2015    Ardie Dragoo PT, DPT 06/14/2019, 12:46 PM  [place] MAIN [facility] SERVICES [address] [place], [place], [address] Phone: [phone]   Fax:  [phone]  Name: Renee Walton MRN: [id number] Date of Birth: 10/10/1996

## 2019-06-15 ENCOUNTER — Ambulatory Visit: Payer: Medicaid Other | Admitting: Physical Therapy

## 2019-06-16 ENCOUNTER — Ambulatory Visit: Payer: Medicaid Other | Admitting: Physical Therapy

## 2019-06-16 ENCOUNTER — Other Ambulatory Visit: Payer: Self-pay

## 2019-06-16 ENCOUNTER — Encounter: Payer: Self-pay | Admitting: Physical Therapy

## 2019-06-16 DIAGNOSIS — M6281 Muscle weakness (generalized): Secondary | ICD-10-CM

## 2019-06-16 DIAGNOSIS — R2681 Unsteadiness on feet: Secondary | ICD-10-CM

## 2019-06-16 NOTE — Therapy (Signed)
[place] MAIN [facility] SERVICES [address] [place], [place], [address] Phone: [phone]   Fax:  [phone]  Physical Therapy Treatment  Patient Details  Name: Renee Walton MRN: [id number] Date of Birth: 1996/08/11 Referring Provider (PT): [place], [place]   Encounter Date: 06/16/2019  PT End of Session - 06/16/19 1155    Visit Number  12    Number of Visits  17    Date for PT Re-Evaluation  06/23/19    Authorization Type  08/18-09/14    Authorization - Visit Number  3    Authorization - Number of Visits  8    PT Start Time  9767    PT Stop Time  1230    PT Time Calculation (min)  42 min    Equipment Utilized During Treatment  Gait belt    Activity Tolerance  Patient tolerated treatment well;No increased pain    Behavior During Therapy  WFL for tasks assessed/performed       Past Medical History:  Diagnosis Date  . Breakthrough bleeding   . Cerebral palsy ([place])   . GAD (generalized anxiety disorder)   . Migraine headache     Past Surgical History:  Procedure Laterality Date  . LEG SURGERY     bilateral x2 in 2008    There were no vitals filed for this visit.  Subjective Assessment - 06/16/19 1155    Subjective  Patient reports doing well; Denies any pain; Reports that she is able to walk farther with less fatigue;    Patient is accompained by:  Family member    Pertinent History  pertinent factors affecting rehab: cerebral palsy, high fall risk; good family support; Patient is walking at home without AD; denies any new falls;    Limitations  Standing;Walking    How long can you stand comfortably?  5-15 min    How long can you walk comfortably?  30 min;     Diagnostic tests  none recent;     Patient Stated Goals  be more independent, walk without person assist,     Currently in Pain?  No/denies             TREATMENT: Warm up on treadmill 1.8 mph with 2 HHA x1mn x2 setswith heavy foot drag on RLE and  decreased step length bilaterally with increased lateral trunk lean;Patient exhibits increased toe out with occasional scissor steps; This is likely related to poor gluteal control and weakness in hips for foot advancement; She also exhibits increased shortness of breath with prolonged ambulation, vitals monitored: HR: 185's, SPO2 98%  Leg press: BLE plate 105# [id number] small ball between knees for better motor control to avoid knee valgus position and improve hip stabilization;    Instructed patient in balance exercise:  Standing on airex: -feet together, arms across chest, side/side trunk rotation x10 reps with mod VCs for erect posture and to improve gluteal activation for less trendelenburg and better hip control/trunk control; -one foot on airex, one foot on step with BUE wand flexion 3# x10 reps each foot on step; patient exhibit increased hip/knee flexion requiring cues for erect posture and to increase gluteal activation for better stance control; -staggered stance: head turns side/side x5 reps unsupported x1 set each foot in front with min A and mod VCs to slow down head turns and improve gaze stabilization for better stance control;   Resisted walking: 7.5# forward/backward x 2 laps each direction with min A for safety;  Response to treatment: Patient continues to exhibit increased foot drag with ambulation with gait compensations of increased lateral trunk lean and hip ER due to weakness in gluteal and hip musculature. Progressed strengthening utilizing pball for increased balance/core strengthening; Patient does require min-mod VCs for proper exercise technique; She reports moderate fatigue at end of session; She does exhibit increased foot drag towards end of session due to fatigue;                     PT Education - 06/16/19 1155    Education provided  Yes    Education Details  LE strengthening, balance/gait safety; HEP    Person(s) Educated  Patient     Methods  Explanation;Verbal cues    Comprehension  Verbalized understanding;Verbal cues required;Returned demonstration;Need further instruction       PT Short Term Goals - 05/25/19 1031      PT SHORT TERM GOAL #1   Title  Patient and family members will be indpendent with HEP and understand they need to do the HEP at home.    Baseline  Was going to the gym but unable to go right now due to coronavirus; 05/25/19- reports adherence with HEP, mom has been helping;    Time  4    Period  Weeks    Status  Achieved    Target Date  05/05/19        PT Long Term Goals - 05/25/19 1032      PT LONG TERM GOAL #1   Title  Patient will increase 10 meter walk test to >1.20ms as to improve gait speed for better community ambulation and to reduce fall risk.    Baseline  04/06/19: 0.98 m/s without walkaide, 1.08 m/s with walkaide; 8/4: 1.11 m/s without AD    Time  4    Period  Weeks    Status  Achieved    Target Date  06/22/19      PT LONG TERM GOAL #2   Title  Patient will be modified independent in walking on TM surface with walk aide for 20+ minutes without rest break, to improve walking tolerance and safety.     Baseline  able to walk 1.2-1.4 mph with 2 HHA for short distance; 8/4: able to walk at 1.2 mph without walkaide with heavy foot drag for approximately 3-4 min with fatigue;    Time  4    Period  Weeks    Status  Partially Met    Target Date  06/22/19      PT LONG TERM GOAL #3   Title  Patient (< 89years old) will complete five times sit to stand test in < 10 seconds indicating an increased LE strength and improved balance.    Baseline  requires 2HHA to push up from chair; 8/4: 11.2 sec without HHA    Time  4    Period  Weeks    Status  Partially Met    Target Date  06/22/19      PT LONG TERM GOAL #4   Title  Patient will report decreased incidences of falls and stumbles to less than 1 a month to improve safety at home.     Baseline  2-4 instances of "close calls" has to grab  mother to stabilize; 8/4: 3-4 x a month requiring HHA to avoid falls;    Time  4    Period  Weeks    Status  Not Met  Target Date  06/22/19      PT LONG TERM GOAL #5   Title  Patient will increase BLE gross strength to 3/5 hip abduction  as to improve functional strength for independent gait, increased standing tolerance and increased ADL ability.    Baseline  R: 2+/5, L: 3-/5    Time  4    Period  Weeks    Status  New    Target Date  06/23/19            Plan - 06/16/19 1254    Clinical Impression Statement  Patient motivated and participated well within session; Progressed LE strengthening with resistance machines with increased weight to challenge strength; patient able to exhibit better weight shift and stance control with balance exercise compared to a few weeks ago. She does require CGA-min A for safety; Patient would benefit from additional skilled PT Intervention to improve strength, balance and gait safety;    Rehab Potential  Good    Clinical Impairments Affecting Rehab Potential  positive: motivated, good family support    PT Frequency  2x / week    PT Duration  4 weeks    PT Treatment/Interventions  Patient/family education;Manual techniques;Therapeutic activities;Therapeutic exercise;Electrical Stimulation;Aquatic Therapy;DME Instruction;Functional mobility training;Gait training;Neuromuscular re-education;ADLs/Self Care Home Management;Moist Heat;Stair training;Balance training;Orthotic Fit/Training    PT Next Visit Plan  walk aide with gait     PT Home Exercise Plan  --   consider high and low adductor stretchs   Consulted and Agree with Plan of Care  Patient    Family Member Consulted  mom       Patient will benefit from skilled therapeutic intervention in order to improve the following deficits and impairments:  Abnormal gait, Decreased balance, Decreased endurance, Decreased mobility, Difficulty walking, Decreased range of motion, Decreased activity tolerance,  Decreased safety awareness, Decreased strength  Visit Diagnosis: Muscle weakness (generalized)  Unsteadiness on feet     Problem List Patient Active Problem List   Diagnosis Date Noted  . Loss of weight 07/17/2015  . Depression 07/17/2015  . Cerebral palsy ([place]) 07/17/2015    , PT, DPT 06/16/2019, 12:57 PM  [place] MAIN [facility] SERVICES [address] [place], [place], [address] Phone: [phone]   Fax:  [phone]  Name: Terran Hollenkamp MRN: [id number] Date of Birth: Nov 12, 1995

## 2019-06-21 ENCOUNTER — Other Ambulatory Visit: Payer: Self-pay

## 2019-06-21 ENCOUNTER — Encounter: Payer: Self-pay | Admitting: Physical Therapy

## 2019-06-21 ENCOUNTER — Ambulatory Visit: Payer: Medicaid Other | Admitting: Physical Therapy

## 2019-06-21 DIAGNOSIS — M6281 Muscle weakness (generalized): Secondary | ICD-10-CM | POA: Diagnosis not present

## 2019-06-21 DIAGNOSIS — R2681 Unsteadiness on feet: Secondary | ICD-10-CM

## 2019-06-21 NOTE — Therapy (Signed)
[place] MAIN [facility] SERVICES [address] [place], [place], [address] Phone: [phone]   Fax:  [phone]  Physical Therapy Treatment  Patient Details  Name: Renee Walton MRN: [id number] Date of Birth: Sep 06, 1996 Referring Provider (PT): [place], [place]   Encounter Date: 06/21/2019  PT End of Session - 06/21/19 1157    Visit Number  13    Number of Visits  17    Date for PT Re-Evaluation  06/23/19    Authorization Type  08/18-09/14    Authorization - Visit Number  4    Authorization - Number of Visits  8    PT Start Time  5400    PT Stop Time  1230    PT Time Calculation (min)  42 min    Equipment Utilized During Treatment  Gait belt    Activity Tolerance  Patient tolerated treatment well;No increased pain    Behavior During Therapy  WFL for tasks assessed/performed       Past Medical History:  Diagnosis Date  . Breakthrough bleeding   . Cerebral palsy ([place])   . GAD (generalized anxiety disorder)   . Migraine headache     Past Surgical History:  Procedure Laterality Date  . LEG SURGERY     bilateral x2 in 2008    There were no vitals filed for this visit.  Subjective Assessment - 06/21/19 1157    Subjective  Patient reports increased anxiety and as a result is having increased stiffness and tone which limits her walking ability; Denies any new falls;    Patient is accompained by:  Family member    Pertinent History  pertinent factors affecting rehab: cerebral palsy, high fall risk; good family support; Patient is walking at home without AD; denies any new falls;    Limitations  Standing;Walking    How long can you stand comfortably?  5-15 min    How long can you walk comfortably?  30 min;     Diagnostic tests  none recent;     Patient Stated Goals  be more independent, walk without person assist,     Currently in Pain?  No/denies    Multiple Pain Sites  No          TREATMENT: Warm up on treadmill 1.8 mph  with 2 HHA x2 min with heavy foot drag on RLE and decreased step length bilaterally with increased lateral trunk lean; Discontinued and instructed patient in leg press to help loosen up legs for better foot clearance; resumed gait training 1.8 mph with 2 HHA x 5 min;  Patient exhibits increased toe out with occasional scissor steps; This is likely related to poor gluteal control and weakness in hips for foot advancement; She also exhibits increased shortness of breath with prolonged ambulation, vitals monitored: HR: 185's, SPO2 98%   Leg press: BLE plate 105# [id number] with small ball between knees for better motor control to avoid knee valgus position and improve hip stabilization;      Instructed patient in balance exercise:   Standing on airex: -one foot on airex, one foot on step:  Eyes open/closed 10 sec hold x1 set each;  BUE arms across chest trunk rotation x5 reps each direction; Patient had increased difficulty achieving neutral stance position, often compensating with increased trunk lean and trendelenburg positioning; Patient required mod VCs to increase gluteal activation for better stance control; She required min A for balance and safety with better stance control with trunk movement;  Sit<>stand from regular chair, unsupported x10 reps with cues to keep arms by side for better activation of legs;    Resisted walking: 7.5# forward/backward x 2 laps each direction with min A for safety; Patient required increased cues for better weight shift for improved dynamic balance control; Upon unhooking patient she exhibited increased fall forward requiring max A +1 to avoid loss of balance;    Response to treatment: Patient continues to exhibit increased foot drag with ambulation with gait compensations of increased lateral trunk lean and hip ER due to weakness in gluteal and hip musculature. She was more fatigued this session which she reports is due to anxiety; She required increased intermittent  rest breaks and cues for better exercise technique for improved motor control and stance control;                     PT Education - 06/21/19 1157    Education provided  Yes    Education Details  LE strengthening, balance/gait safety; HEP    Person(s) Educated  Patient    Methods  Explanation;Verbal cues    Comprehension  Verbalized understanding;Returned demonstration;Verbal cues required;Need further instruction       PT Short Term Goals - 05/25/19 1031      PT SHORT TERM GOAL #1   Title  Patient and family members will be indpendent with HEP and understand they need to do the HEP at home.    Baseline  Was going to the gym but unable to go right now due to coronavirus; 05/25/19- reports adherence with HEP, mom has been helping;    Time  4    Period  Weeks    Status  Achieved    Target Date  05/05/19        PT Long Term Goals - 05/25/19 1032      PT LONG TERM GOAL #1   Title  Patient will increase 10 meter walk test to >1.22ms as to improve gait speed for better community ambulation and to reduce fall risk.    Baseline  04/06/19: 0.98 m/s without walkaide, 1.08 m/s with walkaide; 8/4: 1.11 m/s without AD    Time  4    Period  Weeks    Status  Achieved    Target Date  06/22/19      PT LONG TERM GOAL #2   Title  Patient will be modified independent in walking on TM surface with walk aide for 20+ minutes without rest break, to improve walking tolerance and safety.     Baseline  able to walk 1.2-1.4 mph with 2 HHA for short distance; 8/4: able to walk at 1.2 mph without walkaide with heavy foot drag for approximately 3-4 min with fatigue;    Time  4    Period  Weeks    Status  Partially Met    Target Date  06/22/19      PT LONG TERM GOAL #3   Title  Patient (< 89years old) will complete five times sit to stand test in < 10 seconds indicating an increased LE strength and improved balance.    Baseline  requires 2HHA to push up from chair; 8/4: 11.2 sec without  HHA    Time  4    Period  Weeks    Status  Partially Met    Target Date  06/22/19      PT LONG TERM GOAL #4   Title  Patient will report decreased incidences of  falls and stumbles to less than 1 a month to improve safety at home.     Baseline  2-4 instances of "close calls" has to grab mother to stabilize; 8/4: 3-4 x a month requiring HHA to avoid falls;    Time  4    Period  Weeks    Status  Not Met    Target Date  06/22/19      PT LONG TERM GOAL #5   Title  Patient will increase BLE gross strength to 3/5 hip abduction  as to improve functional strength for independent gait, increased standing tolerance and increased ADL ability.    Baseline  R: 2+/5, L: 3-/5    Time  4    Period  Weeks    Status  New    Target Date  06/23/19            Plan - 06/21/19 1252    Clinical Impression Statement  Patient more fatigued this session and more unsteady. She reports increased tone as aresult of increased anxiety. She reports mixed adherence with HEP due to anxiety and being on her menstrual cycle. Patient required increased cues for proper exericse technique for optimal muscle activation. She did have increased difficulty achieving neutral stance position with increased unsteadiness with balance tasks. Patient would benefit from additional skilled PT Intervention to improve strength, balance and gait safety;    Rehab Potential  Good    Clinical Impairments Affecting Rehab Potential  positive: motivated, good family support    PT Frequency  2x / week    PT Duration  4 weeks    PT Treatment/Interventions  Patient/family education;Manual techniques;Therapeutic activities;Therapeutic exercise;Electrical Stimulation;Aquatic Therapy;DME Instruction;Functional mobility training;Gait training;Neuromuscular re-education;ADLs/Self Care Home Management;Moist Heat;Stair training;Balance training;Orthotic Fit/Training    PT Next Visit Plan  walk aide with gait     PT Home Exercise Plan  --   consider  high and low adductor stretchs   Consulted and Agree with Plan of Care  Patient    Family Member Consulted  mom       Patient will benefit from skilled therapeutic intervention in order to improve the following deficits and impairments:  Abnormal gait, Decreased balance, Decreased endurance, Decreased mobility, Difficulty walking, Decreased range of motion, Decreased activity tolerance, Decreased safety awareness, Decreased strength  Visit Diagnosis: Muscle weakness (generalized)  Unsteadiness on feet     Problem List Patient Active Problem List   Diagnosis Date Noted  . Loss of weight 07/17/2015  . Depression 07/17/2015  . Cerebral palsy ([place]) 07/17/2015    Iya Hamed PT, DPT 06/21/2019, 12:54 PM  [place] MAIN [facility] SERVICES [address] [place], [place], [address] Phone: [phone]   Fax:  [phone]  Name: Renee Walton MRN: [id number] Date of Birth: October 30, 1995

## 2019-06-24 ENCOUNTER — Other Ambulatory Visit: Payer: Self-pay

## 2019-06-24 ENCOUNTER — Encounter: Payer: Self-pay | Admitting: Physical Therapy

## 2019-06-24 ENCOUNTER — Ambulatory Visit: Payer: Medicaid Other | Attending: Family Medicine | Admitting: Physical Therapy

## 2019-06-24 DIAGNOSIS — M6281 Muscle weakness (generalized): Secondary | ICD-10-CM | POA: Diagnosis present

## 2019-06-24 DIAGNOSIS — R2681 Unsteadiness on feet: Secondary | ICD-10-CM

## 2019-06-24 NOTE — Patient Instructions (Signed)
Access Code: [email or web address]  URL: [email or web address]  Date: 06/24/2019  Prepared by: Blanche East   Exercises  Quadruped Alternating Leg Extensions - 5 reps - 2 sets - 1x daily - 7x weekly

## 2019-06-24 NOTE — Therapy (Signed)
[place] MAIN [facility] SERVICES [address] [place], [place], [address] Phone: [phone]   Fax:  [phone]  Physical Therapy Treatment  Patient Details  Name: Renee Walton MRN: [id number] Date of Birth: May 30, 1996 Referring Provider (PT): [place], [place]   Encounter Date: 06/24/2019  PT End of Session - 06/24/19 1149    Visit Number  14    Number of Visits  25    Date for PT Re-Evaluation  07/22/19    Authorization Type  08/18-09/14    Authorization - Visit Number  5    Authorization - Number of Visits  8    PT Start Time  7510    PT Stop Time  1230    PT Time Calculation (min)  42 min    Equipment Utilized During Treatment  Gait belt    Activity Tolerance  Patient tolerated treatment well;No increased pain    Behavior During Therapy  WFL for tasks assessed/performed       Past Medical History:  Diagnosis Date  . Breakthrough bleeding   . Cerebral palsy ([place])   . GAD (generalized anxiety disorder)   . Migraine headache     Past Surgical History:  Procedure Laterality Date  . LEG SURGERY     bilateral x2 in 2008    There were no vitals filed for this visit.  Subjective Assessment - 06/24/19 1148    Subjective  Patient experienced a fall in the hallway on the way to therapy this session; She reports slight increase in fatigue and unsteadiness;    Patient is accompained by:  Family member    Pertinent History  pertinent factors affecting rehab: cerebral palsy, high fall risk; good family support; Patient is walking at home without AD; denies any new falls;    Limitations  Standing;Walking    How long can you stand comfortably?  5-15 min    How long can you walk comfortably?  30 min;     Diagnostic tests  none recent;     Patient Stated Goals  be more independent, walk without person assist,     Currently in Pain?  No/denies    Multiple Pain Sites  No             TREATMENT:  Leg press: BLE plate105#  2x15with small ball between knees for better motor control to avoid knee valgus position and improve hip stabilization;   Patient hooklying: -bridges with 3# bar chest press x15reps withgood hip ROM and motor control noted; -Single leg bridges in figure 4 position x10 reps each LE, 3# wand chest press to challenge hip control,  Required mod VCs to keep hips in neutral with added difficulty due to hip/core weakness;    Patient prone: Passive quad stretch knee flexion 20 sec hold x1 reps bilaterally; Passive quad stretch with hip extension for better dual joint stretch 20 sec hold x1 rep bilaterally; Attempted gluteal max hip extension but patient unable; Prone hip extension 2x5 reps bilaterally, partial ROM each LE due to weakness; patient able to activate hamstring/gluteals but has difficulty avoiding trunk rotation due to weakness;  Patient tall kneeling: Squat to tall kneeling x10 reps with intermittent HHA for balance; Attempted transition to 1/2 kneeling but patient unable due to significant hip weakness;  Patient transitioned to qped: -hip extension single leg x5 reps each side with min A for balance and mod VCs to increase push through UE and to contract core for better stabilization;  Patient sidelying: -clamshells without tband resistance x10repseach LEwith mod VCs for proper positioning including to avoid trunk rotation and to isolate hip abduction for better strengthening; Exhibits partial ROM due to weakness;patient does have difficulty avoiding trunk activation and isolating gluteal muscle activation;   Response to treatment: Patient toleratedfair. She does require increased cues for correct exercise technique and position. Patient has significant difficulty achieving gluteal activation for hip abduction, often compensating with accessory muscle activation. She would benefit from additional skilled PT to address HEP for strengthening and  mobility;                                    PT Education - 06/24/19 1149    Education provided  Yes    Education Details  LE strengthening, balance/gait safety; HEP    Person(s) Educated  Patient    Methods  Explanation;Verbal cues    Comprehension  Verbalized understanding;Returned demonstration;Verbal cues required;Need further instruction       PT Short Term Goals - 05/25/19 1031      PT SHORT TERM GOAL #1   Title  Patient and family members will be indpendent with HEP and understand they need to do the HEP at home.    Baseline  Was going to the gym but unable to go right now due to coronavirus; 05/25/19- reports adherence with HEP, mom has been helping;    Time  4    Period  Weeks    Status  Achieved    Target Date  05/05/19        PT Long Term Goals - 06/24/19 1149      PT LONG TERM GOAL #1   Title  Patient will increase 10 meter walk test to >1.11ms as to improve gait speed for better community ambulation and to reduce fall risk.    Baseline  04/06/19: 0.98 m/s without walkaide, 1.08 m/s with walkaide; 8/4: 1.11 m/s without AD    Time  4    Period  Weeks    Status  Achieved    Target Date  07/22/19      PT LONG TERM GOAL #2   Title  Patient will be modified independent in walking on TM surface with walk aide for 20+ minutes without rest break, to improve walking tolerance and safety.     Baseline  able to walk 1.2-1.4 mph with 2 HHA for short distance; 8/4: able to walk at 1.2 mph without walkaide with heavy foot drag for approximately 3-4 min with fatigue;    Time  4    Period  Weeks    Status  Partially Met    Target Date  07/22/19      PT LONG TERM GOAL #3   Title  Patient (< 89years old) will complete five times sit to stand test in < 10 seconds indicating an increased LE strength and improved balance.    Baseline  requires 2HHA to push up from chair; 8/4: 11.2 sec without HHA    Time  4    Period  Weeks    Status   Partially Met    Target Date  07/22/19      PT LONG TERM GOAL #4   Title  Patient will report decreased incidences of falls and stumbles to less than 1 a month to improve safety at home.     Baseline  2-4 instances of "close calls" has  to grab mother to stabilize; 8/4: 3-4 x a month requiring HHA to avoid falls;    Time  4    Period  Weeks    Status  Not Met    Target Date  07/22/19      PT LONG TERM GOAL #5   Title  Patient will increase BLE gross strength to 3/5 hip abduction  as to improve functional strength for independent gait, increased standing tolerance and increased ADL ability.    Baseline  R: 2+/5, L: 3-/5    Time  4    Period  Weeks    Status  Partially Met    Target Date  07/22/19            Plan - 06/24/19 1211    Clinical Impression Statement  Patient is doing well. Instructed patient in advanced LE strengthening. She is progressing in exercise with improved AROM  bilaterally. Although patient does continue to have significant weakness, particularly in posterior hip musculature requiring increased cues for proper exercise technique. Reinforced HEP. Patient is supposed to get a home aide in 2 weeks to assist with HEP adherence. SHe would benefit from additional skilled PT Intervention to improve strength, balance and gait safety;    Rehab Potential  Good    Clinical Impairments Affecting Rehab Potential  positive: motivated, good family support    PT Frequency  2x / week    PT Duration  4 weeks    PT Treatment/Interventions  Patient/family education;Manual techniques;Therapeutic activities;Therapeutic exercise;Electrical Stimulation;Aquatic Therapy;DME Instruction;Functional mobility training;Gait training;Neuromuscular re-education;ADLs/Self Care Home Management;Moist Heat;Stair training;Balance training;Orthotic Fit/Training    PT Next Visit Plan  walk aide with gait     PT Home Exercise Plan  --   consider high and low adductor stretchs   Consulted and Agree with  Plan of Care  Patient    Family Member Consulted  mom       Patient will benefit from skilled therapeutic intervention in order to improve the following deficits and impairments:  Abnormal gait, Decreased balance, Decreased endurance, Decreased mobility, Difficulty walking, Decreased range of motion, Decreased activity tolerance, Decreased safety awareness, Decreased strength  Visit Diagnosis: Muscle weakness (generalized)  Unsteadiness on feet     Problem List Patient Active Problem List   Diagnosis Date Noted  . Loss of weight 07/17/2015  . Depression 07/17/2015  . Cerebral palsy ([place]) 07/17/2015    Trotter,Margaret 06/24/2019, 12:12 PM  [place] MAIN [facility] SERVICES [address] [place], [place], [address] Phone: [phone]   Fax:  [phone]  Name: Renee Walton MRN: [id number] Date of Birth: [birth year]

## 2019-07-01 ENCOUNTER — Ambulatory Visit: Payer: Medicaid Other | Admitting: Physical Therapy

## 2019-07-01 ENCOUNTER — Other Ambulatory Visit: Payer: Self-pay

## 2019-07-01 ENCOUNTER — Encounter: Payer: Self-pay | Admitting: Physical Therapy

## 2019-07-01 DIAGNOSIS — R2681 Unsteadiness on feet: Secondary | ICD-10-CM

## 2019-07-01 DIAGNOSIS — M6281 Muscle weakness (generalized): Secondary | ICD-10-CM

## 2019-07-01 NOTE — Therapy (Signed)
[place] MAIN [facility] SERVICES [address] [place], [place], [address] Phone: [phone]   Fax:  [phone]  Physical Therapy Treatment  Patient Details  Name: Renee Walton MRN: [id number] Date of Birth: [birth year] Referring Provider (PT): [place], [place]   Encounter Date: 07/01/2019  PT End of Session - 07/01/19 1147    Visit Number  15    Number of Visits  25    Date for PT Re-Evaluation  07/22/19    Authorization Type  08/18-09/14    Authorization - Visit Number  6    Authorization - Number of Visits  8    PT Start Time  0160    PT Stop Time  1230    PT Time Calculation (min)  43 min    Equipment Utilized During Treatment  Gait belt    Activity Tolerance  Patient tolerated treatment well;No increased pain    Behavior During Therapy  WFL for tasks assessed/performed       Past Medical History:  Diagnosis Date  . Breakthrough bleeding   . Cerebral palsy ([place])   . GAD (generalized anxiety disorder)   . Migraine headache     Past Surgical History:  Procedure Laterality Date  . LEG SURGERY     bilateral x2 in 2008    There were no vitals filed for this visit.  Subjective Assessment - 07/01/19 1153    Subjective  Patient reports doing about the same. She reports no new falls but has been walking slower;    Patient is accompained by:  Family member    Pertinent History  pertinent factors affecting rehab: cerebral palsy, high fall risk; good family support; Patient is walking at home without AD; denies any new falls;    Limitations  Standing;Walking    How long can you stand comfortably?  5-15 min    How long can you walk comfortably?  30 min;     Diagnostic tests  none recent;     Patient Stated Goals  be more independent, walk without person assist,     Currently in Pain?  Yes    Pain Score  4     Pain Location  Leg    Pain Orientation  Right;Posterior    Pain Descriptors / Indicators  Tightness    Pain Type   Acute pain    Pain Onset  In the past 7 days    Pain Frequency  Intermittent    Aggravating Factors   anxiety    Pain Relieving Factors  walking/stretching    Effect of Pain on Daily Activities  no change;    Multiple Pain Sites  No           TREATMENT:  Leg press: BLE plate115# 2x15with small ball between knees for better motor control to avoid knee valgus position and improve hip stabilization;   Patient hooklying: -SLR hamstring stretch with therapist holding down LLE for better stretch 30 sec hold x2 reps; -passive single knee to chest stretch RLE, with contralateral LLE extension to disassociate LEs x20 sec hold x2 reps; -bridges with 2# bar chest press x15reps withgood hip ROM and motor control noted; -Single leg bridges in figure 4 position x10 reps each LE, 2# wand chest press to challenge hip control,  Required mod VCs to keep hips in neutral with added difficulty due to hip/core weakness;    Patient prone: Passive quad stretch knee flexion 20 sec hold x1 reps bilaterally; Passive quad stretch  with hip extension for better dual joint stretch 20 sec hold x1 rep bilaterally;  Patient tall kneeling: Squat to tall kneeling x10 reps with intermittent HHA for balance; required mod VCs to increase RLE gluteal activation for improved neutral weight shift for better balance and strengthening; Increased fatigue noted with better weight shift;   Tall kneeling with red tband around BLE side stepping on knees x5 feet each direction with 2 HHA x2 set each with cues for weight shift and to increase ROM for better strengthening;    Instructed patient in balance exercise:  Standing on airex: -feet together, arms across chest, head turns side/side, up/down x5 reps with mod VCs for erect posture and to improve gluteal activation for less trendelenburg and better hip control/trunk control; -feet together unsupported ball toss x10 reps with min A for safety; patient requires cues  for proper weight shift for better stance control;    Response to treatment: Patient toleratedfair. She does require increased cues for correct exercise technique and position. Patient has significant difficulty achieving gluteal activation for hip abduction, often compensating with accessory muscle activation. She would benefit from additional skilled PT to address HEP for strengthening and mobility;                       PT Education - 07/01/19 1147    Education provided  Yes    Education Details  LE strengthening/balance/gait safety; HEP    Person(s) Educated  Patient    Methods  Explanation;Verbal cues    Comprehension  Verbalized understanding;Returned demonstration;Verbal cues required;Need further instruction       PT Short Term Goals - 05/25/19 1031      PT SHORT TERM GOAL #1   Title  Patient and family members will be indpendent with HEP and understand they need to do the HEP at home.    Baseline  Was going to the gym but unable to go right now due to coronavirus; 05/25/19- reports adherence with HEP, mom has been helping;    Time  4    Period  Weeks    Status  Achieved    Target Date  05/05/19        PT Long Term Goals - 06/24/19 1149      PT LONG TERM GOAL #1   Title  Patient will increase 10 meter walk test to >1.92ms as to improve gait speed for better community ambulation and to reduce fall risk.    Baseline  04/06/19: 0.98 m/s without walkaide, 1.08 m/s with walkaide; 8/4: 1.11 m/s without AD    Time  4    Period  Weeks    Status  Achieved    Target Date  07/22/19      PT LONG TERM GOAL #2   Title  Patient will be modified independent in walking on TM surface with walk aide for 20+ minutes without rest break, to improve walking tolerance and safety.     Baseline  able to walk 1.2-1.4 mph with 2 HHA for short distance; 8/4: able to walk at 1.2 mph without walkaide with heavy foot drag for approximately 3-4 min with fatigue;    Time  4     Period  Weeks    Status  Partially Met    Target Date  07/22/19      PT LONG TERM GOAL #3   Title  Patient (< 419years old) will complete five times sit to stand test in < 10  seconds indicating an increased LE strength and improved balance.    Baseline  requires 2HHA to push up from chair; 8/4: 11.2 sec without HHA    Time  4    Period  Weeks    Status  Partially Met    Target Date  07/22/19      PT LONG TERM GOAL #4   Title  Patient will report decreased incidences of falls and stumbles to less than 1 a month to improve safety at home.     Baseline  2-4 instances of "close calls" has to grab mother to stabilize; 8/4: 3-4 x a month requiring HHA to avoid falls;    Time  4    Period  Weeks    Status  Not Met    Target Date  07/22/19      PT LONG TERM GOAL #5   Title  Patient will increase BLE gross strength to 3/5 hip abduction  as to improve functional strength for independent gait, increased standing tolerance and increased ADL ability.    Baseline  R: 2+/5, L: 3-/5    Time  4    Period  Weeks    Status  Partially Met    Target Date  07/22/19            Plan - 07/01/19 1247    Clinical Impression Statement  Patient reports continued fatigue and unsteadiness and stiffness in RLE related to anxiety. Patient instructed in advanced balance/strengthening exercise. She continues to have weakness in RLE gluteal muscle which limits weight shift and stance control. Patient requires frequent cues for proper weight shift and correct exercise technique. She would benefit from additional skilled PT Intervention to improve strength, balance and gait safety;    Rehab Potential  Good    Clinical Impairments Affecting Rehab Potential  positive: motivated, good family support    PT Frequency  2x / week    PT Duration  4 weeks    PT Treatment/Interventions  Patient/family education;Manual techniques;Therapeutic activities;Therapeutic exercise;Electrical Stimulation;Aquatic Therapy;DME  Instruction;Functional mobility training;Gait training;Neuromuscular re-education;ADLs/Self Care Home Management;Moist Heat;Stair training;Balance training;Orthotic Fit/Training    PT Next Visit Plan  walk aide with gait     PT Home Exercise Plan  --   consider high and low adductor stretchs   Consulted and Agree with Plan of Care  Patient    Family Member Consulted  mom       Patient will benefit from skilled therapeutic intervention in order to improve the following deficits and impairments:  Abnormal gait, Decreased balance, Decreased endurance, Decreased mobility, Difficulty walking, Decreased range of motion, Decreased activity tolerance, Decreased safety awareness, Decreased strength  Visit Diagnosis: Muscle weakness (generalized)  Unsteadiness on feet     Problem List Patient Active Problem List   Diagnosis Date Noted  . Loss of weight 07/17/2015  . Depression 07/17/2015  . Cerebral palsy ([place]) 07/17/2015    Myishia Kasik PT, DPT 07/01/2019, 12:52 PM  [place] MAIN [facility] SERVICES [address] [place], [place], [address] Phone: [phone]   Fax:  [phone]  Name: Renee Walton MRN: [id number] Date of Birth: 1995/12/21

## 2019-07-05 ENCOUNTER — Ambulatory Visit: Payer: Medicaid Other | Admitting: Physical Therapy

## 2019-07-05 ENCOUNTER — Encounter: Payer: Self-pay | Admitting: Physical Therapy

## 2019-07-05 ENCOUNTER — Other Ambulatory Visit: Payer: Self-pay

## 2019-07-05 DIAGNOSIS — M6281 Muscle weakness (generalized): Secondary | ICD-10-CM | POA: Diagnosis not present

## 2019-07-05 DIAGNOSIS — R2681 Unsteadiness on feet: Secondary | ICD-10-CM

## 2019-07-05 NOTE — Therapy (Signed)
[place] MAIN [facility] SERVICES [address] [place], [place], [address] Phone: [phone]   Fax:  [phone]  Physical Therapy Treatment  Patient Details  Name: Renee Walton MRN: [id number] Date of Birth: Jun 30, 1996 Referring Provider (PT): [place], [place]   Encounter Date: 07/05/2019  PT End of Session - 07/05/19 1146    Visit Number  16    Number of Visits  31    Date for PT Re-Evaluation  08/30/19    Authorization Type  08/18-09/14    Authorization - Visit Number  7    Authorization - Number of Visits  8    PT Start Time  6789    PT Stop Time  1230    PT Time Calculation (min)  43 min    Equipment Utilized During Treatment  Gait belt    Activity Tolerance  Patient tolerated treatment well;No increased pain    Behavior During Therapy  WFL for tasks assessed/performed       Past Medical History:  Diagnosis Date  . Breakthrough bleeding   . Cerebral palsy ([place])   . GAD (generalized anxiety disorder)   . Migraine headache     Past Surgical History:  Procedure Laterality Date  . LEG SURGERY     bilateral x2 in 2008    There were no vitals filed for this visit.  Subjective Assessment - 07/05/19 1153    Subjective  Patient reports that she is feeling less stiff compared to last visit; no new falls    Patient is accompained by:  Family member    Pertinent History  pertinent factors affecting rehab: cerebral palsy, high fall risk; good family support; Patient is walking at home without AD; denies any new falls;    Limitations  Standing;Walking    How long can you stand comfortably?  5-15 min    How long can you walk comfortably?  30 min;     Diagnostic tests  none recent;     Patient Stated Goals  be more independent, walk without person assist,     Pain Onset  In the past 7 days         [facility] PT Assessment - 07/05/19 0001      Strength   Right Hip Flexion  3-/5    Right Hip ABduction  2+/5    Right Hip  ADduction  3/5    Left Hip Flexion  3-/5    Left Hip ABduction  3-/5    Left Hip ADduction  3+/5      Standardized Balance Assessment   Five times sit to stand comments   9.8 sec without HHA      Berg Balance Test   Sit to Stand  Able to stand without using hands and stabilize independently    Standing Unsupported  Able to stand 2 minutes with supervision    Sitting with Back Unsupported but Feet Supported on Floor or Stool  Able to sit safely and securely 2 minutes    Stand to Sit  Sits safely with minimal use of hands    Transfers  Able to transfer with verbal cueing and /or supervision    Standing Unsupported with Eyes Closed  Able to stand 3 seconds    Standing Unsupported with Feet Together  Needs help to attain position but able to stand for 30 seconds with feet together    From Standing, Reach Forward with Outstretched Arm  Can reach forward >12 cm safely (5")  From Standing Position, Pick up Object from [place] to pick up shoe, needs supervision    From Standing Position, Turn to Look Behind Over each Shoulder  Needs assist to keep from losing balance and falling    Turn 360 Degrees  Needs assistance while turning    Standing Unsupported, Alternately Place Feet on Step/Stool  Needs assistance to keep from falling or unable to try    Standing Unsupported, One Foot in [place] help to step but can hold 15 seconds    Standing on One Leg  Unable to try or needs assist to prevent fall    Total Score  27         TREATMENT: Warm up with walking on treadmill at 1.5 mphfor 6 min, 0.15 miles total,Cues to increase erect posture, reduce trunk lean, improve step length and increase DF at heel strike; patient exhibit increased foot drag on RLE with increased hip ER and decreased hip/knee flexion during swing with mild hip circumduction; She also exhibits heavy trunk lean due to poor gluteal activation and decreased hip/core stabilization in mid stance.   vitals after gait, HR 140  bpm, SPo2 100%   Patient hooklying: -bridges on pball x15 reps with arms across chest for better hip strengthening and stabilization challenge; Required min Vcs for proper positioning; able to exhibit good stability with minimal tilt;  Instructed patient in 5 times sit<>Stand, Berg Balance Assessment, assessed strength to address goals, see above;    Response to treatment: Patient toleratedfair. She does exhibit improved mobility but continues to have decreased balance and safety awareness with unsupported standing. Patient responded fair to cues. She has difficulty correcting gait pattern due to tone and weakness in BLE. Reinforced HEP; Patient and caregiver verbalized understanding;                      PT Education - 07/05/19 1146    Education provided  Yes    Education Details  HEP, strengthening, balance, gait safety.    Person(s) Educated  Patient;Parent(s)    Methods  Explanation;Verbal cues    Comprehension  Verbalized understanding;Returned demonstration;Verbal cues required;Need further instruction       PT Short Term Goals - 05/25/19 1031      PT SHORT TERM GOAL #1   Title  Patient and family members will be indpendent with HEP and understand they need to do the HEP at home.    Baseline  Was going to the gym but unable to go right now due to coronavirus; 05/25/19- reports adherence with HEP, mom has been helping;    Time  4    Period  Weeks    Status  Achieved    Target Date  05/05/19        PT Long Term Goals - 07/05/19 1148      PT LONG TERM GOAL #1   Title  Patient will increase 10 meter walk test to >1.82ms as to improve gait speed for better community ambulation and to reduce fall risk.    Baseline  04/06/19: 0.98 m/s without walkaide, 1.08 m/s with walkaide; 8/4: 1.11 m/s without AD    Time  8    Period  Weeks    Status  Achieved    Target Date  08/30/19      PT LONG TERM GOAL #2   Title  Patient will be modified independent in  walking on TM surface with walk aide for 20+ minutes without  rest break, to improve walking tolerance and safety.     Baseline  able to walk 1.2-1.4 mph with 2 HHA for short distance; 8/4: able to walk at 1.2 mph without walkaide with heavy foot drag for approximately 3-4 min with fatigue; 9/14 Able to walk 1.5 mph without resistance for 6 minutes with heavy foot drag, mild fatigue.    Time  8    Period  Weeks    Status  Partially Met    Target Date  08/30/19      PT LONG TERM GOAL #3   Title  Patient (< 98 years old) will complete five times sit to stand test in < 10 seconds indicating an increased LE strength and improved balance.    Baseline  requires 2HHA to push up from chair; 8/4: 11.2 sec without HHA, 9/14 9.8 sec without HHA.    Time  8    Period  Weeks    Status  Achieved    Target Date  08/30/19      PT LONG TERM GOAL #4   Title  Patient will report decreased incidences of falls and stumbles to less than 1 a month to improve safety at home.     Baseline  2-4 instances of "close calls" has to grab mother to stabilize; 8/4: 3-4 x a month requiring HHA to avoid falls; 9/14 1 fall this month, 2-3 stumbles each day, able to catch self.    Time  8    Period  Weeks    Status  Partially Met    Target Date  08/30/19      PT LONG TERM GOAL #5   Title  Patient will increase BLE gross strength to 3/5 hip abduction  as to improve functional strength for independent gait, increased standing tolerance and increased ADL ability.    Baseline  9/14: R: 2+/5, L: 3-/5    Time  8    Period  Weeks    Status  Partially Met    Target Date  08/30/19      PT LONG TERM GOAL #6   Title  Patient will increase Berg Balance score by > 6 points to demonstrate decreased fall risk during functional activities.    Baseline  07/05/19: 27/56 high fall risk    Time  8    Period  Weeks    Status  New    Target Date  08/30/19      PT LONG TERM GOAL #7   Title  Patient will increase six minute walk test  distance to >1000 for progression to community ambulator and improve gait ability    Time  8    Period  Weeks    Status  New    Target Date  08/30/19            Plan - 07/05/19 1313    Clinical Impression Statement  Patient is doing well. She exhibits improved gait tolerance being able to walk on treadmill for longer time/distance with less fatigue. She continues to have increased right foot drag and increased lateral trunk sway due to poor gluteal strength in standing. Patient exhibits high fall risk as evidenced with Berg Balance Assessment. She reports multiple stumbles daily but only experienced 1 fall in last month. She continues to have significant weakness in BLE, predominantly in hip abductors (R>L). She would benefit from skilled PT intervention to improve strength, balance and gait safety; Patient has an extensive HEP and has been doing it regularly.  She is still unable to go back to the gym due to the pandemic. She is supposed to get a home aide in the next 2 weeks to assist her with her HEP and ADLs.    Rehab Potential  Good    Clinical Impairments Affecting Rehab Potential  positive: motivated, good family support    PT Frequency  1x / week    PT Duration  8 weeks    PT Treatment/Interventions  Patient/family education;Manual techniques;Therapeutic activities;Therapeutic exercise;Electrical Stimulation;Aquatic Therapy;DME Instruction;Functional mobility training;Gait training;Neuromuscular re-education;ADLs/Self Care Home Management;Moist Heat;Stair training;Balance training;Orthotic Fit/Training    PT Next Visit Plan  walk aide with gait     PT Home Exercise Plan  --   consider high and low adductor stretchs   Consulted and Agree with Plan of Care  Patient    Family Member Consulted  mom       Patient will benefit from skilled therapeutic intervention in order to improve the following deficits and impairments:  Abnormal gait, Decreased balance, Decreased endurance, Decreased  mobility, Difficulty walking, Decreased range of motion, Decreased activity tolerance, Decreased safety awareness, Decreased strength  Visit Diagnosis: Muscle weakness (generalized)  Unsteadiness on feet     Problem List Patient Active Problem List   Diagnosis Date Noted  . Loss of weight 07/17/2015  . Depression 07/17/2015  . Cerebral palsy ([place]) 07/17/2015    Kahlie Deutscher PT, DPT 07/05/2019, 1:23 PM  [place] [facility] MAIN [facility] SERVICES [address] [place], [place], [address] Phone: [phone]   Fax:  [phone]  Name: Renee Walton MRN: [id number] Date of Birth: 06-Sep-1996

## 2019-07-07 ENCOUNTER — Ambulatory Visit: Payer: Medicaid Other | Admitting: Physical Therapy

## 2019-07-12 ENCOUNTER — Encounter: Payer: Self-pay | Admitting: Physical Therapy

## 2019-07-12 ENCOUNTER — Other Ambulatory Visit: Payer: Self-pay

## 2019-07-12 ENCOUNTER — Ambulatory Visit: Payer: Medicaid Other | Admitting: Physical Therapy

## 2019-07-12 DIAGNOSIS — R2681 Unsteadiness on feet: Secondary | ICD-10-CM

## 2019-07-12 DIAGNOSIS — M6281 Muscle weakness (generalized): Secondary | ICD-10-CM

## 2019-07-12 NOTE — Patient Instructions (Signed)
Access Code: [email or web address]  URL: [email or web address]  Date: 07/12/2019  Prepared by: Blanche East   Exercises  Standing Weight Shifting Forward and Backward - 10 reps - 2 sets - 1x daily - 7x weekly  Standing Weight Shift Side to Side - 10 reps - 2 sets - 1x daily - 7x weekly

## 2019-07-12 NOTE — Therapy (Signed)
[place] MAIN [facility] SERVICES [address] [place], [place], [address] Phone: [phone]   Fax:  [phone]  Physical Therapy Treatment  Patient Details  Name: Renee Walton MRN: [id number] Date of Birth: [birth year] Referring Provider (PT): [place], [place]   Encounter Date: 07/12/2019  PT End of Session - 07/12/19 1146    Visit Number  17    Number of Visits  31    Date for PT Re-Evaluation  08/30/19    Authorization Type  authorized 1 visit 9/21-11/8    Authorization - Visit Number  1    Authorization - Number of Visits  1    PT Start Time  4982    PT Stop Time  1230    PT Time Calculation (min)  43 min    Equipment Utilized During Treatment  Gait belt    Activity Tolerance  Patient tolerated treatment well;No increased pain    Behavior During Therapy  WFL for tasks assessed/performed       Past Medical History:  Diagnosis Date  . Breakthrough bleeding   . Cerebral palsy ([place])   . GAD (generalized anxiety disorder)   . Migraine headache     Past Surgical History:  Procedure Laterality Date  . LEG SURGERY     bilateral x2 in 2008    There were no vitals filed for this visit.  Subjective Assessment - 07/12/19 1301    Subjective  Patient reports feeling well; Denies any new falls;    Patient is accompained by:  Family member    Pertinent History  pertinent factors affecting rehab: cerebral palsy, high fall risk; good family support; Patient is walking at home without AD; denies any new falls;    Limitations  Standing;Walking    How long can you stand comfortably?  5-15 min    How long can you walk comfortably?  30 min;     Diagnostic tests  none recent;     Patient Stated Goals  be more independent, walk without person assist,     Currently in Pain?  No/denies    Pain Onset  In the past 7 days    Multiple Pain Sites  No         [facility] PT Assessment - 07/12/19 0001      6 Minute Walk- Baseline   6 Minute  Walk- Baseline  yes    BP (mmHg)  133/74    HR (bpm)  97    02 Sat (%RA)  98 %      6 Minute walk- Post Test   6 Minute Walk Post Test  yes    BP (mmHg)  (!) 147/91    HR (bpm)  132    02 Sat (%RA)  100 %      6 minute walk test results    Aerobic Endurance Distance Walked  890    Endurance additional comments  without AD, but requires min A for safety with significant gait deviations including increased foot drag, increased toe out on RLE with poor hip/knee flexion during swing;  with increased gait distance, increased deviations noted with significant unsteadiness;            TREATMENT: Instructed patient in 6 min walk test to address cardiovascular endurance;   Instructed patient in balance exercise:  Standing on airex: -feet together, facing forward with arms across chest 30 sec hold x3 sets with patient reaching out for rail intermittently after 15-20 sec having difficulty  using ankles/hips for strategies to compensate for loss of balance; -feet together, arms across chest, head turns side/side,  x5 reps with mod VCs for erect posture and to improve gluteal activation for less trendelenburg and better hip control/trunk control; -feet together, forward/backward weight shift using shoulders to activate hip strategies x5 reps x2 sets; -Feet apart side/side weight shift x5 reps with min A for safety and cues for proper positioning; -feet together unsupported balloon taps x2 min x2 sets with cues for weight shift for better stance control; min A required for safety;  Advanced HEP with standing weight shift forward/backward, side/side to challenge stance control;    Response to treatment: Patient toleratedfair. She does require increased cues for correct exercise technique and position. Patient exhibits decreased hip/ankle strategies often compensating using UE to reach out to parallel bars for balance recovery; She is able to stand unsupported for short time well but with  increased time often falls forward/backwards reaching out for rail assist or requiring min A for safety;                  PT Education - 07/12/19 1146    Education provided  Yes    Education Details  HEP/strengthening/balance/gait safety;    Person(s) Educated  Patient;Parent(s)    Methods  Explanation;Demonstration;Verbal cues    Comprehension  Verbalized understanding;Returned demonstration;Verbal cues required;Need further instruction       PT Short Term Goals - 05/25/19 1031      PT SHORT TERM GOAL #1   Title  Patient and family members will be indpendent with HEP and understand they need to do the HEP at home.    Baseline  Was going to the gym but unable to go right now due to coronavirus; 05/25/19- reports adherence with HEP, mom has been helping;    Time  4    Period  Weeks    Status  Achieved    Target Date  05/05/19        PT Long Term Goals - 07/05/19 1148      PT LONG TERM GOAL #1   Title  Patient will increase 10 meter walk test to >1.7ms as to improve gait speed for better community ambulation and to reduce fall risk.    Baseline  04/06/19: 0.98 m/s without walkaide, 1.08 m/s with walkaide; 8/4: 1.11 m/s without AD    Time  8    Period  Weeks    Status  Achieved    Target Date  08/30/19      PT LONG TERM GOAL #2   Title  Patient will be modified independent in walking on TM surface with walk aide for 20+ minutes without rest break, to improve walking tolerance and safety.     Baseline  able to walk 1.2-1.4 mph with 2 HHA for short distance; 8/4: able to walk at 1.2 mph without walkaide with heavy foot drag for approximately 3-4 min with fatigue; 9/14 Able to walk 1.5 mph without resistance for 6 minutes with heavy foot drag, mild fatigue.    Time  8    Period  Weeks    Status  Partially Met    Target Date  08/30/19      PT LONG TERM GOAL #3   Title  Patient (< 665years old) will complete five times sit to stand test in < 10 seconds indicating an  increased LE strength and improved balance.    Baseline  requires 2HHA to push up from  chair; 8/4: 11.2 sec without HHA, 9/14 9.8 sec without HHA.    Time  8    Period  Weeks    Status  Achieved    Target Date  08/30/19      PT LONG TERM GOAL #4   Title  Patient will report decreased incidences of falls and stumbles to less than 1 a month to improve safety at home.     Baseline  2-4 instances of "close calls" has to grab mother to stabilize; 8/4: 3-4 x a month requiring HHA to avoid falls; 9/14 1 fall this month, 2-3 stumbles each day, able to catch self.    Time  8    Period  Weeks    Status  Partially Met    Target Date  08/30/19      PT LONG TERM GOAL #5   Title  Patient will increase BLE gross strength to 3/5 hip abduction  as to improve functional strength for independent gait, increased standing tolerance and increased ADL ability.    Baseline  9/14: R: 2+/5, L: 3-/5    Time  8    Period  Weeks    Status  Partially Met    Target Date  08/30/19      PT LONG TERM GOAL #6   Title  Patient will increase Berg Balance score by > 6 points to demonstrate decreased fall risk during functional activities.    Baseline  07/05/19: 27/56 high fall risk    Time  8    Period  Weeks    Status  New    Target Date  08/30/19      PT LONG TERM GOAL #7   Title  Patient will increase six minute walk test distance to >1000 for progression to community ambulator and improve gait ability    Time  8    Period  Weeks    Status  New    Target Date  08/30/19            Plan - 07/12/19 1528    Clinical Impression Statement  Patient motivated and tolerated session well. She was able to complete 890 feet on 6 min walk test but does require min A for safety with unsteadiness and imbalance noted. Patient exhibits increased gait deviations with increased foot drag and more lateral sway with increased gait distance and fatigue. She was instructed in advanced balance exercise to challenge stance  control. Patient has significant difficulty holding static standing balance >20 sec without reaching out for railings for compensation. She was instructed in weight shifts to improve hip/ankle strategies for better stance control. Patient fatigues quickly with increased deviations/compensations noted. She would benefit from additional skilled PT intervention to improve strength, balance/gait safety;    Rehab Potential  Good    Clinical Impairments Affecting Rehab Potential  positive: motivated, good family support    PT Frequency  1x / week    PT Duration  8 weeks    PT Treatment/Interventions  Patient/family education;Manual techniques;Therapeutic activities;Therapeutic exercise;Electrical Stimulation;Aquatic Therapy;DME Instruction;Functional mobility training;Gait training;Neuromuscular re-education;ADLs/Self Care Home Management;Moist Heat;Stair training;Balance training;Orthotic Fit/Training    PT Next Visit Plan  walk aide with gait     PT Home Exercise Plan  --   consider high and low adductor stretchs   Consulted and Agree with Plan of Care  Patient    Family Member Consulted  mom       Patient will benefit from skilled therapeutic intervention in order to improve  the following deficits and impairments:  Abnormal gait, Decreased balance, Decreased endurance, Decreased mobility, Difficulty walking, Decreased range of motion, Decreased activity tolerance, Decreased safety awareness, Decreased strength  Visit Diagnosis: Muscle weakness (generalized)  Unsteadiness on feet     Problem List Patient Active Problem List   Diagnosis Date Noted  . Loss of weight 07/17/2015  . Depression 07/17/2015  . Cerebral palsy ([place]) 07/17/2015    , PT, DPT 07/12/2019, 3:34 PM  [place] MAIN [facility] SERVICES [address] [place], [place], [address] Phone: [phone]   Fax:  [phone]  Name: Shaguana Love MRN: [id number] Date of Birth:  09/14/1996

## 2019-07-14 ENCOUNTER — Ambulatory Visit: Payer: Medicaid Other | Admitting: Physical Therapy

## 2019-07-19 ENCOUNTER — Ambulatory Visit: Payer: Medicaid Other | Admitting: Physical Therapy

## 2019-07-19 ENCOUNTER — Encounter: Payer: Self-pay | Admitting: Physical Therapy

## 2019-07-19 ENCOUNTER — Other Ambulatory Visit: Payer: Self-pay

## 2019-07-19 DIAGNOSIS — R2681 Unsteadiness on feet: Secondary | ICD-10-CM

## 2019-07-19 DIAGNOSIS — M6281 Muscle weakness (generalized): Secondary | ICD-10-CM

## 2019-07-19 NOTE — Therapy (Signed)
[place] MAIN [facility] SERVICES [address] [place], [place], [address] Phone: [phone]   Fax:  [phone]  Physical Therapy Treatment  Patient Details  Name: Renee Walton MRN: [id number] Date of Birth: Mar 05, 1996 Referring Provider (PT): [place], [place]   Encounter Date: 07/19/2019  PT End of Session - 07/19/19 1415    Visit Number  18    Number of Visits  31    Date for PT Re-Evaluation  08/30/19    Authorization Type  authorized 6 visit 9/28-11/8    Authorization - Visit Number  1    Authorization - Number of Visits  6    PT Start Time  1146    PT Stop Time  1230    PT Time Calculation (min)  44 min    Equipment Utilized During Treatment  Gait belt    Activity Tolerance  Patient tolerated treatment well;No increased pain    Behavior During Therapy  WFL for tasks assessed/performed       Past Medical History:  Diagnosis Date  . Breakthrough bleeding   . Cerebral palsy ([place])   . GAD (generalized anxiety disorder)   . Migraine headache     Past Surgical History:  Procedure Laterality Date  . LEG SURGERY     bilateral x2 in 2008    There were no vitals filed for this visit.  Subjective Assessment - 07/19/19 1157    Subjective  Patient reports doing well; denies any pain; She is still waiting on insurance improval for home aide;    Patient is accompained by:  Family member    Pertinent History  pertinent factors affecting rehab: cerebral palsy, high fall risk; good family support; Patient is walking at home without AD; denies any new falls;    Limitations  Standing;Walking    How long can you stand comfortably?  5-15 min    How long can you walk comfortably?  30 min;     Diagnostic tests  none recent;     Patient Stated Goals  be more independent, walk without person assist,     Currently in Pain?  No/denies    Pain Onset  In the past 7 days    Multiple Pain Sites  No                         TREATMENT:  Instructed patient in balance exercise:  Standing on airex: -feet together, facing forward with arms across chest 30 sec hold x3 sets with patient reaching out for rail intermittently after 15-20 sec having difficulty using ankles/hips for strategies to compensate for loss of balance; -feet together, arms across chest,head turnsside/side,  x10reps with mod VCs for erect posture and to improve gluteal activation for less trendelenburg and better hip control/trunk control; -feet together, BUE arm up/down x10 reps with looking up to challenge stance control;   -Feet apart side/side weight shift x5 reps with min A for safety and cues for proper positioning;   Standing on airex pad: -feet together unsupported x30 sec with min A for safety; -feet together, heel raises x10 reps with min A to challenge ankle strategies with unsupported standing; -one foot on airex, one foot on 4 inch step, unsupported standing x30 sec with cues for proper weight shift and stance control; progressed to staggered stance with head turns side/side x5 reps each direction;    Standing outside parallel bars: Forward step and hold for 10 sec, then  stepping back together, unsupported x2 reps each LE; Attempted backwards stepping but patient unable to achieve due to weakness in gluteal muscles and poor trunk control; Side step and then back together x5 reps each LE; Patient often compensates for loss of balance with upper trunk lean and hip ER to widen base of support. She required cues to keep feet oriented forward for less hip ER to challenge gluteal activation and improved stance control.    Response to treatment: Patient toleratedfair. She does require increased cues for correct exercise technique and position. Patient exhibits decreased hip/ankle strategies often compensating using UE to reach out to parallel bars for balance recovery; She is able to stand unsupported for short time well but with  increased time often falls forward/backwards reaching out for rail assist or requiring min A for safety; She does exhibit improved stance control this session on firm surface being able to progress balance exercise on foam pad;                PT Education - 07/19/19 1415    Education provided  Yes    Education Details  balance, HEP    Person(s) Educated  Patient;Parent(s)    Methods  Explanation;Verbal cues    Comprehension  Verbalized understanding;Returned demonstration;Verbal cues required;Need further instruction       PT Short Term Goals - 05/25/19 1031      PT SHORT TERM GOAL #1   Title  Patient and family members will be indpendent with HEP and understand they need to do the HEP at home.    Baseline  Was going to the gym but unable to go right now due to coronavirus; 05/25/19- reports adherence with HEP, mom has been helping;    Time  4    Period  Weeks    Status  Achieved    Target Date  05/05/19        PT Long Term Goals - 07/05/19 1148      PT LONG TERM GOAL #1   Title  Patient will increase 10 meter walk test to >1.56ms as to improve gait speed for better community ambulation and to reduce fall risk.    Baseline  04/06/19: 0.98 m/s without walkaide, 1.08 m/s with walkaide; 8/4: 1.11 m/s without AD    Time  8    Period  Weeks    Status  Achieved    Target Date  08/30/19      PT LONG TERM GOAL #2   Title  Patient will be modified independent in walking on TM surface with walk aide for 20+ minutes without rest break, to improve walking tolerance and safety.     Baseline  able to walk 1.2-1.4 mph with 2 HHA for short distance; 8/4: able to walk at 1.2 mph without walkaide with heavy foot drag for approximately 3-4 min with fatigue; 9/14 Able to walk 1.5 mph without resistance for 6 minutes with heavy foot drag, mild fatigue.    Time  8    Period  Weeks    Status  Partially Met    Target Date  08/30/19      PT LONG TERM GOAL #3   Title  Patient (< 658years  old) will complete five times sit to stand test in < 10 seconds indicating an increased LE strength and improved balance.    Baseline  requires 2HHA to push up from chair; 8/4: 11.2 sec without HHA, 9/14 9.8 sec without HHA.    Time  8    Period  Weeks    Status  Achieved    Target Date  08/30/19      PT LONG TERM GOAL #4   Title  Patient will report decreased incidences of falls and stumbles to less than 1 a month to improve safety at home.     Baseline  2-4 instances of "close calls" has to grab mother to stabilize; 8/4: 3-4 x a month requiring HHA to avoid falls; 9/14 1 fall this month, 2-3 stumbles each day, able to catch self.    Time  8    Period  Weeks    Status  Partially Met    Target Date  08/30/19      PT LONG TERM GOAL #5   Title  Patient will increase BLE gross strength to 3/5 hip abduction  as to improve functional strength for independent gait, increased standing tolerance and increased ADL ability.    Baseline  9/14: R: 2+/5, L: 3-/5    Time  8    Period  Weeks    Status  Partially Met    Target Date  08/30/19      PT LONG TERM GOAL #6   Title  Patient will increase Berg Balance score by > 6 points to demonstrate decreased fall risk during functional activities.    Baseline  07/05/19: 27/56 high fall risk    Time  8    Period  Weeks    Status  New    Target Date  08/30/19      PT LONG TERM GOAL #7   Title  Patient will increase six minute walk test distance to >1000 for progression to community ambulator and improve gait ability    Time  8    Period  Weeks    Status  New    Target Date  08/30/19            Plan - 07/19/19 1416    Clinical Impression Statement  Patient motivated and participated well within session. Focused on balance exercise with unsupported standing on firm and compliant surfaces. Patient required min-mod VCs and tactile cues for proper positioning and trunk control. She exhibits improved stance ability being able to stand on firm  surface unsupported requiring less assistance with better stability this session. progressed balance exercise with dynamic movement unsupported. She did require min A to recover balance when stepping outside base of support. She would benefit from additional skilled PT Intervention to improve strength, balance and gait safety; Patient is supposed to get walkaide device this week to help with walking ability;    Rehab Potential  Good    Clinical Impairments Affecting Rehab Potential  positive: motivated, good family support    PT Frequency  1x / week    PT Duration  8 weeks    PT Treatment/Interventions  Patient/family education;Manual techniques;Therapeutic activities;Therapeutic exercise;Electrical Stimulation;Aquatic Therapy;DME Instruction;Functional mobility training;Gait training;Neuromuscular re-education;ADLs/Self Care Home Management;Moist Heat;Stair training;Balance training;Orthotic Fit/Training    PT Next Visit Plan  walk aide with gait     PT Home Exercise Plan  --   consider high and low adductor stretchs   Consulted and Agree with Plan of Care  Patient    Family Member Consulted  mom       Patient will benefit from skilled therapeutic intervention in order to improve the following deficits and impairments:  Abnormal gait, Decreased balance, Decreased endurance, Decreased mobility, Difficulty walking, Decreased range of motion, Decreased activity tolerance, Decreased  safety awareness, Decreased strength  Visit Diagnosis: Muscle weakness (generalized)  Unsteadiness on feet     Problem List Patient Active Problem List   Diagnosis Date Noted  . Loss of weight 07/17/2015  . Depression 07/17/2015  . Cerebral palsy ([place]) 07/17/2015    Jonai Weyland PT, DPT 07/19/2019, 2:18 PM  [place] MAIN [facility] SERVICES [address] [place], [place], [address] Phone: [phone]   Fax:  [phone]  Name: Renee Walton MRN: [id number] Date  of Birth: 1996/02/23

## 2019-07-26 ENCOUNTER — Other Ambulatory Visit: Payer: Self-pay

## 2019-07-26 ENCOUNTER — Encounter: Payer: Self-pay | Admitting: Physical Therapy

## 2019-07-26 ENCOUNTER — Ambulatory Visit: Payer: Medicaid Other | Attending: Family Medicine | Admitting: Physical Therapy

## 2019-07-26 DIAGNOSIS — M6281 Muscle weakness (generalized): Secondary | ICD-10-CM

## 2019-07-26 DIAGNOSIS — R2681 Unsteadiness on feet: Secondary | ICD-10-CM | POA: Diagnosis present

## 2019-07-26 NOTE — Therapy (Signed)
[place] MAIN [facility] SERVICES [address] [place], [place], [address] Phone: [phone]   Fax:  [phone]  Physical Therapy Treatment  Patient Details  Name: Renee Walton MRN: [id number] Date of Birth: 15-May-1996 Referring Provider (PT): [place], [place]   Encounter Date: 07/26/2019  PT End of Session - 07/26/19 1100    Visit Number  19    Number of Visits  31    Date for PT Re-Evaluation  08/30/19    Authorization Type  authorized 6 visit 9/28-11/8    Authorization - Visit Number  2    Authorization - Number of Visits  6    PT Start Time  1101    PT Stop Time  1142    PT Time Calculation (min)  41 min    Equipment Utilized During Treatment  Gait belt    Activity Tolerance  Patient tolerated treatment well;No increased pain    Behavior During Therapy  WFL for tasks assessed/performed       Past Medical History:  Diagnosis Date  . Breakthrough bleeding   . Cerebral palsy ([place])   . GAD (generalized anxiety disorder)   . Migraine headache     Past Surgical History:  Procedure Laterality Date  . LEG SURGERY     bilateral x2 in 2008    There were no vitals filed for this visit.  Subjective Assessment - 07/26/19 1104    Subjective  Patient reports doing well; She has an appointment with orthotist on 10/15 for walkaide; Her home aide startes this week and will be helping 1x a week for 2 hours each week. That will potentially increase depending on insurance coverage; She also plans to return to the gym, maybe in January;    Patient is accompained by:  Family member    Pertinent History  pertinent factors affecting rehab: cerebral palsy, high fall risk; good family support; Patient is walking at home without AD; denies any new falls;    Limitations  Standing;Walking    How long can you stand comfortably?  5-15 min    How long can you walk comfortably?  30 min;     Diagnostic tests  none recent;     Patient Stated Goals  be  more independent, walk without person assist,     Currently in Pain?  Yes    Pain Score  4     Pain Location  Knee    Pain Orientation  Right    Pain Descriptors / Indicators  Aching    Pain Type  Acute pain    Pain Onset  In the past 7 days    Pain Frequency  Intermittent    Aggravating Factors   unsure    Pain Relieving Factors  unsure    Effect of Pain on Daily Activities  no change;    Multiple Pain Sites  No             TREATMENT:  Instructed patient in balance exercise:  Standing on airex: -feet together, facing forward with arms across chest 30 sec hold x3 sets with patient reaching out for rail intermittently after 15-20 sec having difficulty using ankles/hips for strategies to compensate for loss of balance; -feet together, arms across chest,head turnsside/side, x10reps with mod VCs for erect posture and to improve gluteal activation for less trendelenburg and better hip control/trunk control; Standing on airex pad: -modified tandem stance unsupported 15 sec with intermittent rail assist x2 sets each with mod  VCs for weight shift;  Standing on firm surface: Stepping forward unsupported and holding 10 sec x3 sets each foot with MinA-CGA and intermittent rail assist;  With increased repetition patient able to exhibit improved stance control and less instability;   Standing outside parallel bars: Marching unsupported with intermittent stopping to challenge dynamic balance x5 min with approximately 6 stops. Initially patient required min A for balance control with 1 episode of significant loss of balance with mod A for balance recovery; she exhibits forward loss of balance; Instructed patient to keep head erect to reduce forward weight shift to improve balance recovery; with increased repetition patient able to exhibit improved stance control with less instability with starting/stopping;   Patient often compensates for loss of balance with upper trunk lean and hip  ER to widen base of support. She required cues to keep feet oriented forward for less hip ER to challenge gluteal activation and improved stance control.    Weaving around cones #4 x4 sets with min A to CGA for safety; Patient required cues to increase step length and improve turning;  She did have difficulty with cones close together with increased instability with tight turns.   Walking forward picking up cones with patient stopping and slowly picking up and then stopping upon standing x4 reps with CGA for safety; Patient able to keep balance well with CGA unsupported;    Exercise: Sit<>Stand while holding ball to challenge LE control x10 reps with good weight shift during transfer; She does have trouble slowing down sit down due to weakness with eccentric lower.   Response to treatment: Patient toleratedwell. She continues to compensate for imbalance with widened base of support/hip ER for better stance control. She had increased difficulty with tandem stance especially on compliant surfaces. Patient does exhibit better dynamic balance control with starting/stopping unsupported with less instability; Patient often relies on momentum to keep balance but this session was able to stop walking intermittently without loss of balance. She does continue to have significant LE weakness particularly in hips which leads to compensation of stance position due to poor gluteal activation;                    PT Education - 07/26/19 1100    Education provided  Yes    Education Details  balance, gait safety, HEP    Person(s) Educated  Patient;Parent(s)    Methods  Explanation;Verbal cues    Comprehension  Verbalized understanding;Returned demonstration;Verbal cues required;Need further instruction       PT Short Term Goals - 05/25/19 1031      PT SHORT TERM GOAL #1   Title  Patient and family members will be indpendent with HEP and understand they need to do the HEP at home.     Baseline  Was going to the gym but unable to go right now due to coronavirus; 05/25/19- reports adherence with HEP, mom has been helping;    Time  4    Period  Weeks    Status  Achieved    Target Date  05/05/19        PT Long Term Goals - 07/05/19 1148      PT LONG TERM GOAL #1   Title  Patient will increase 10 meter walk test to >1.0m/s as to improve gait speed for better community ambulation and to reduce fall risk.    Baseline  04/06/19: 0.98 m/s without walkaide, 1.08 m/s with walkaide; 8/4: 1.11 m/s without AD      Time  8    Period  Weeks    Status  Achieved    Target Date  08/30/19      PT LONG TERM GOAL #2   Title  Patient will be modified independent in walking on TM surface with walk aide for 20+ minutes without rest break, to improve walking tolerance and safety.     Baseline  able to walk 1.2-1.4 mph with 2 HHA for short distance; 8/4: able to walk at 1.2 mph without walkaide with heavy foot drag for approximately 3-4 min with fatigue; 9/14 Able to walk 1.5 mph without resistance for 6 minutes with heavy foot drag, mild fatigue.    Time  8    Period  Weeks    Status  Partially Met    Target Date  08/30/19      PT LONG TERM GOAL #3   Title  Patient (< 60 years old) will complete five times sit to stand test in < 10 seconds indicating an increased LE strength and improved balance.    Baseline  requires 2HHA to push up from chair; 8/4: 11.2 sec without HHA, 9/14 9.8 sec without HHA.    Time  8    Period  Weeks    Status  Achieved    Target Date  08/30/19      PT LONG TERM GOAL #4   Title  Patient will report decreased incidences of falls and stumbles to less than 1 a month to improve safety at home.     Baseline  2-4 instances of "close calls" has to grab mother to stabilize; 8/4: 3-4 x a month requiring HHA to avoid falls; 9/14 1 fall this month, 2-3 stumbles each day, able to catch self.    Time  8    Period  Weeks    Status  Partially Met    Target Date  08/30/19       PT LONG TERM GOAL #5   Title  Patient will increase BLE gross strength to 3/5 hip abduction  as to improve functional strength for independent gait, increased standing tolerance and increased ADL ability.    Baseline  9/14: R: 2+/5, L: 3-/5    Time  8    Period  Weeks    Status  Partially Met    Target Date  08/30/19      PT LONG TERM GOAL #6   Title  Patient will increase Berg Balance score by > 6 points to demonstrate decreased fall risk during functional activities.    Baseline  07/05/19: 27/56 high fall risk    Time  8    Period  Weeks    Status  New    Target Date  08/30/19      PT LONG TERM GOAL #7   Title  Patient will increase six minute walk test distance to >1000 for progression to community ambulator and improve gait ability    Time  8    Period  Weeks    Status  New    Target Date  08/30/19            Plan - 07/26/19 1201    Clinical Impression Statement  Patient motivated and participated well within session. She exhibits improved balance this session being able to start/stop walking unsupported with less loss of balance. She continues to have weakness in both legs particularly in hips which leads to compensation with trunk movement for stance control. Patient would benefit from additional   skilled PT Intervention to improve strength, balance and gait safety;    Rehab Potential  Good    Clinical Impairments Affecting Rehab Potential  positive: motivated, good family support    PT Frequency  1x / week    PT Duration  8 weeks    PT Treatment/Interventions  Patient/family education;Manual techniques;Therapeutic activities;Therapeutic exercise;Electrical Stimulation;Aquatic Therapy;DME Instruction;Functional mobility training;Gait training;Neuromuscular re-education;ADLs/Self Care Home Management;Moist Heat;Stair training;Balance training;Orthotic Fit/Training    PT Next Visit Plan  walk aide with gait     PT Home Exercise Plan  --   consider high and low adductor  stretchs   Consulted and Agree with Plan of Care  Patient    Family Member Consulted  mom       Patient will benefit from skilled therapeutic intervention in order to improve the following deficits and impairments:  Abnormal gait, Decreased balance, Decreased endurance, Decreased mobility, Difficulty walking, Decreased range of motion, Decreased activity tolerance, Decreased safety awareness, Decreased strength  Visit Diagnosis: Muscle weakness (generalized)  Unsteadiness on feet     Problem List Patient Active Problem List   Diagnosis Date Noted  . Loss of weight 07/17/2015  . Depression 07/17/2015  . Cerebral palsy ([facility]) 07/17/2015    Trotter,Margaret PT, DPT 07/26/2019, 12:57 PM  [place] [facility] MA[facility] REHAB SERVICES [address] [place], [place], [address] Phone: [phone]   Fax:  [phone]  Name: Renee Walton MRN: [id number] Date of Birth: 02/02/1996   

## 2019-08-02 ENCOUNTER — Ambulatory Visit: Payer: Medicaid Other | Admitting: Physical Therapy

## 2019-08-09 ENCOUNTER — Ambulatory Visit: Payer: Medicaid Other | Admitting: Physical Therapy

## 2019-08-09 ENCOUNTER — Other Ambulatory Visit: Payer: Self-pay

## 2019-08-09 ENCOUNTER — Encounter: Payer: Self-pay | Admitting: Physical Therapy

## 2019-08-09 DIAGNOSIS — M6281 Muscle weakness (generalized): Secondary | ICD-10-CM | POA: Diagnosis not present

## 2019-08-09 DIAGNOSIS — R2681 Unsteadiness on feet: Secondary | ICD-10-CM

## 2019-08-09 NOTE — Therapy (Signed)
[place] MAIN [facility] SERVICES [address] [place], [place], [address] Phone: [phone]   Fax:  [phone]  Physical Therapy Treatment Physical Therapy Progress Note   Dates of reporting period 07/12/19   to   08/09/19    Patient Details  Name: Renee Walton MRN: [id number] Date of Birth: 11-13-1995 Referring Provider (PT): [place], [place]   Encounter Date: 08/09/2019  PT End of Session - 08/09/19 1246    Visit Number  20    Number of Visits  31    Date for PT Re-Evaluation  08/30/19    Authorization Type  authorized 6 visit 9/28-11/8    Authorization - Visit Number  3    Authorization - Number of Visits  6    PT Start Time  9242    PT Stop Time  1230    PT Time Calculation (min)  44 min    Equipment Utilized During Treatment  Gait belt    Activity Tolerance  Patient tolerated treatment well;No increased pain    Behavior During Therapy  WFL for tasks assessed/performed       Past Medical History:  Diagnosis Date  . Breakthrough bleeding   . Cerebral palsy ([place])   . GAD (generalized anxiety disorder)   . Migraine headache     Past Surgical History:  Procedure Laterality Date  . LEG SURGERY     bilateral x2 in 2008    There were no vitals filed for this visit.  Subjective Assessment - 08/09/19 1256    Subjective  "I don't like my walkaide. I have fallen 3x since I put it on and it keeps going off when I fall."    Patient is accompained by:  Family member    Pertinent History  pertinent factors affecting rehab: cerebral palsy, high fall risk; good family support; Patient is walking at home without AD; denies any new falls;    Limitations  Standing;Walking    How long can you stand comfortably?  5-15 min    How long can you walk comfortably?  30 min;     Diagnostic tests  none recent;     Patient Stated Goals  be more independent, walk without person assist,     Currently in Pain?  No/denies    Pain Onset  In  the past 7 days    Multiple Pain Sites  No         [facility] PT Assessment - 08/09/19 0001      6 Minute Walk- Baseline   BP (mmHg)  135/79    HR (bpm)  101    02 Sat (%RA)  100 %      6 Minute walk- Post Test   BP (mmHg)  (!) 166/102    HR (bpm)  141    02 Sat (%RA)  100 %      6 minute walk test results    Aerobic Endurance Distance Walked  900    Endurance additional comments  without AD, walkaide on but not working properly for foot clearance, does continue          TREATMENT: Patient presents to therapy with walkaide and expressed frustration;  PT put walkaide on patient and educated patient and caregiver on proper donning technique and positioning;  Patient expressed sensitivity with walkaide on level #1; PT adjusted settings with reducing pulse width to 50 for better tolerance;   Patient instructed in 6 min walk test, see above; She required  CGA for safety with intermittent min A but was mostly able to keep balance with less falling forward. Patient did reach out to hold onto wall for safety with turning;  During 6 min walk test, patient's walkaide did not come on/off correctly with unweighting/weighting. Attempted moving sensory to forefoot for better weight bearing, but this did not correct issue;  Educated patient and caregiver in how walkaide works and how walkaide will not correct most of patient's gait deviations including lateral trunk lean as this is a result of hip weakness and poor trunk control; Patient verbalized understanding and expressed that she is considering returning walkaide at this time;    Patient's condition has the potential to improve in response to therapy. Maximum improvement is yet to be obtained. The anticipated improvement is attainable and reasonable in a generally predictable time.  Patient reports adherence with HEP and states that she has been working with her aide and caregiver with balance and walking;                   PT  Education - 08/09/19 1240    Education provided  Yes    Education Details  gait safety, walkaide;    Person(s) Educated  Patient;Parent(s)    Methods  Explanation;Verbal cues    Comprehension  Verbalized understanding;Returned demonstration;Verbal cues required;Need further instruction       PT Short Term Goals - 05/25/19 1031      PT SHORT TERM GOAL #1   Title  Patient and family members will be indpendent with HEP and understand they need to do the HEP at home.    Baseline  Was going to the gym but unable to go right now due to coronavirus; 05/25/19- reports adherence with HEP, mom has been helping;    Time  4    Period  Weeks    Status  Achieved    Target Date  05/05/19        PT Long Term Goals - 08/09/19 1225      PT LONG TERM GOAL #1   Title  Patient will increase 10 meter walk test to >1.23ms as to improve gait speed for better community ambulation and to reduce fall risk.    Baseline  04/06/19: 0.98 m/s without walkaide, 1.08 m/s with walkaide; 8/4: 1.11 m/s without AD    Time  8    Period  Weeks    Status  Achieved    Target Date  08/30/19      PT LONG TERM GOAL #2   Title  Patient will be modified independent in walking on TM surface with walk aide for 20+ minutes without rest break, to improve walking tolerance and safety.     Baseline  able to walk 1.2-1.4 mph with 2 HHA for short distance; 8/4: able to walk at 1.2 mph without walkaide with heavy foot drag for approximately 3-4 min with fatigue; 9/14 Able to walk 1.5 mph without resistance for 6 minutes with heavy foot drag, mild fatigue.    Time  8    Period  Weeks    Status  Partially Met    Target Date  08/30/19      PT LONG TERM GOAL #3   Title  Patient (< 623years old) will complete five times sit to stand test in < 10 seconds indicating an increased LE strength and improved balance.    Baseline  requires 2HHA to push up from chair; 8/4: 11.2 sec without HHA, 9/14  9.8 sec without HHA.    Time  8    Period   Weeks    Status  Achieved    Target Date  08/30/19      PT LONG TERM GOAL #4   Title  Patient will report decreased incidences of falls and stumbles to less than 1 a month to improve safety at home.     Baseline  2-4 instances of "close calls" has to grab mother to stabilize; 8/4: 3-4 x a month requiring HHA to avoid falls; 9/14 1 fall this month, 2-3 stumbles each day, able to catch self.    Time  8    Period  Weeks    Status  Partially Met    Target Date  08/30/19      PT LONG TERM GOAL #5   Title  Patient will increase BLE gross strength to 3/5 hip abduction  as to improve functional strength for independent gait, increased standing tolerance and increased ADL ability.    Baseline  9/14: R: 2+/5, L: 3-/5    Time  8    Period  Weeks    Status  Partially Met    Target Date  08/30/19      PT LONG TERM GOAL #6   Title  Patient will increase Berg Balance score by > 6 points to demonstrate decreased fall risk during functional activities.    Baseline  07/05/19: 27/56 high fall risk    Time  8    Period  Weeks    Status  On-going    Target Date  08/30/19      PT LONG TERM GOAL #7   Title  Patient will increase six minute walk test distance to >1000 for progression to community ambulator and improve gait ability    Time  8    Period  Weeks    Status  Partially Met    Target Date  08/30/19            Plan - 08/09/19 1255    Clinical Impression Statement  Patient motivated and participated well within session. She did bring in walkaide and expressed frustration over it not working properly and leading to increased falls. PT adjusted walkaide settings to improve tolerance of use but patient still frustrated over gait deviations. She expressed that she is going to return walkaide. PT assessed gait mechanics with 6 min walk test. While her distance did not significant change, patient was able to complete test with less instability requiring less assistance to mostly CGA from min A.  Patient would benefit from additional skilled PT Intervention to improve strength, balance and gait safety;    Rehab Potential  Good    Clinical Impairments Affecting Rehab Potential  positive: motivated, good family support    PT Frequency  1x / week    PT Duration  8 weeks    PT Treatment/Interventions  Patient/family education;Manual techniques;Therapeutic activities;Therapeutic exercise;Electrical Stimulation;Aquatic Therapy;DME Instruction;Functional mobility training;Gait training;Neuromuscular re-education;ADLs/Self Care Home Management;Moist Heat;Stair training;Balance training;Orthotic Fit/Training    PT Next Visit Plan  walk aide with gait     PT Home Exercise Plan  --   consider high and low adductor stretchs   Consulted and Agree with Plan of Care  Patient    Family Member Consulted  mom       Patient will benefit from skilled therapeutic intervention in order to improve the following deficits and impairments:  Abnormal gait, Decreased balance, Decreased endurance, Decreased mobility, Difficulty walking, Decreased  range of motion, Decreased activity tolerance, Decreased safety awareness, Decreased strength  Visit Diagnosis: Muscle weakness (generalized)  Unsteadiness on feet     Problem List Patient Active Problem List   Diagnosis Date Noted  . Loss of weight 07/17/2015  . Depression 07/17/2015  . Cerebral palsy ([place]) 07/17/2015    , PT, DPT 08/09/2019, 1:03 PM  [place] MAIN [facility] SERVICES [address] [place], [place], [address] Phone: [phone]   Fax:  [phone]  Name: Renee Walton MRN: [id number] Date of Birth: 1995-12-30

## 2019-08-10 ENCOUNTER — Ambulatory Visit: Payer: Medicaid Other | Admitting: Physical Therapy

## 2019-08-10 ENCOUNTER — Encounter: Payer: Self-pay | Admitting: Physical Therapy

## 2019-08-10 DIAGNOSIS — M6281 Muscle weakness (generalized): Secondary | ICD-10-CM | POA: Diagnosis not present

## 2019-08-10 DIAGNOSIS — R2681 Unsteadiness on feet: Secondary | ICD-10-CM

## 2019-08-10 NOTE — Patient Instructions (Signed)
AMBULATION: Treadmill    Walk on treadmill. Set at _1.2-1.5__ mph. Walk _20+__ minutes per set, _3__ days per week Hold rails.   HIP / KNEE: Extension (Hip Sled)    Place feet shoulder-width apart. Push down into heels to straighten knees and hips. Do not hyperextend or lock knees. Hold __2_ seconds. Use _90__ lbs. _12-15__ reps per set, _2__ sets per day, __3_ days per week   Copyright  VHI. All rights reserved.  Heel Raise - Incline (Machine)    Ankles flexed and calves stretched, (keeping knees straight) press toes forward as far as possible. 60-70#, do 12-15 repetitions Do __2__ sets.  http://st.exer.us/148   Copyright  VHI. All rights reserved.  Leg Abduction: Sitting (Machine)    Move legs outward and slowly return to start. Start at 30-40#, do sets of 12-15 repetitions  http://st.exer.us/354   Copyright  VHI. All rights reserved.  Leg Adduction: Sitting (Machine)    Legs separated, move legs together and slowly return to start. Start at about 30-40# Do 2 sets of 12-15 reps http://st.exer.us/362   Copyright  VHI. All rights reserved.  Knee Flexion: Hamstring Drop (Eccentric) - Seated (Weight Machine)    Sit on machine. Pull feet back until knees are at 90. Slowly release legs for 3-5 seconds. Start at 15-20# _12-15__ reps per set, _2__ sets per day, __3_ days per week.   http://ecce.exer.us/108

## 2019-08-10 NOTE — Therapy (Signed)
[place] MAIN [facility] SERVICES [address] [place], [place], [address] Phone: [phone]   Fax:  [phone]  Physical Therapy Treatment/Dischage Summary  Patient Details  Name: Renee Walton MRN: [id number] Date of Birth: 02-Oct-1996 Referring Provider (PT): [place], [place]   Encounter Date: 08/10/2019  PT End of Session - 08/10/19 0853    Visit Number  21    Number of Visits  31    Date for PT Re-Evaluation  08/30/19    Authorization Type  authorized 6 visit 9/28-11/8    Authorization - Visit Number  4    Authorization - Number of Visits  6    PT Start Time  6962    PT Stop Time  0930    PT Time Calculation (min)  43 min    Equipment Utilized During Treatment  Gait belt    Activity Tolerance  Patient tolerated treatment well;No increased pain    Behavior During Therapy  WFL for tasks assessed/performed       Past Medical History:  Diagnosis Date  . Breakthrough bleeding   . Cerebral palsy ([place])   . GAD (generalized anxiety disorder)   . Migraine headache     Past Surgical History:  Procedure Laterality Date  . LEG SURGERY     bilateral x2 in 2008    There were no vitals filed for this visit.  Subjective Assessment - 08/10/19 0853    Subjective  Patient reports "today will be my last day. I am going out of town and won't be back until January 2021."    Patient is accompained by:  Family member    Pertinent History  pertinent factors affecting rehab: cerebral palsy, high fall risk; good family support; Patient is walking at home without AD; denies any new falls;    Limitations  Standing;Walking    How long can you stand comfortably?  5-15 min    How long can you walk comfortably?  30 min;     Diagnostic tests  none recent;     Patient Stated Goals  be more independent, walk without person assist,     Currently in Pain?  No/denies    Pain Onset  In the past 7 days    Multiple Pain Sites  No         [facility] PT  Assessment - 08/10/19 0001      Berg Balance Test   Sit to Stand  Able to stand without using hands and stabilize independently    Standing Unsupported  Able to stand 2 minutes with supervision    Sitting with Back Unsupported but Feet Supported on Floor or Stool  Able to sit safely and securely 2 minutes    Stand to Sit  Sits safely with minimal use of hands    Transfers  Able to transfer safely, definite need of hands    Standing Unsupported with Eyes Closed  Able to stand 10 seconds safely    Standing Unsupported with Feet Together  Needs help to attain position but able to stand for 30 seconds with feet together    From Standing, Reach Forward with Outstretched Arm  Can reach forward >12 cm safely (5")    From Standing Position, Pick up Object from Floor  Able to pick up shoe, needs supervision    From Standing Position, Turn to Look Behind Over each Shoulder  Needs supervision when turning    Turn 360 Degrees  Needs assistance while turning  Standing Unsupported, Alternately Place Feet on Step/Stool  Needs assistance to keep from falling or unable to try    Standing Unsupported, One Foot in Front  Needs help to step but can hold 15 seconds    Standing on One Leg  Unable to try or needs assist to prevent fall    Total Score  31    Berg comment:  <36/56 indicates high fall risk, improved from 27/56           TREATMENT: PT instructed patient in Berg Balance Assessment and other outcome measures to address goals, see above;  Gait on treadmill 1.2-1.5 mph with 2 HHA x9mn with cues to increase step length and improve erect posture for less lateral trunk lean (compensation from gluteal weakness) for improved gait mechanics. Patient ambulates with increased foot drag with hip ER to facilitate better foot clearance with less ankle DF at heel strike; HR 127 bpm, SPo2 99% following gait on treadmill.    Re-educated patient in HEP with instruction in balance exercise and gym strengthening  exercise, see patient instructions. She required min VCs for correct positioning, set up and hand placement for safety; Patient and caregiver verbalized understanding. Provided written instruction for better adherence;   Tolerated session well. Patient continues to have weakness in BLE and tested as a high fall risk. However, since patient is unable to return to therapy this calendar year, she will be discharged at her request.               PT Education - 08/10/19 0853    Education provided  Yes    Education Details  progress towards goals, balance, HEP    Person(s) Educated  Patient;Parent(s)    Methods  Explanation;Verbal cues    Comprehension  Verbalized understanding;Returned demonstration;Verbal cues required       PT Short Term Goals - 05/25/19 1031      PT SHORT TERM GOAL #1   Title  Patient and family members will be indpendent with HEP and understand they need to do the HEP at home.    Baseline  Was going to the gym but unable to go right now due to coronavirus; 05/25/19- reports adherence with HEP, mom has been helping;    Time  4    Period  Weeks    Status  Achieved    Target Date  05/05/19        PT Long Term Goals - 08/10/19 0924      PT LONG TERM GOAL #1   Title  Patient will increase 10 meter walk test to >1.036m as to improve gait speed for better community ambulation and to reduce fall risk.    Baseline  04/06/19: 0.98 m/s without walkaide, 1.08 m/s with walkaide; 8/4: 1.11 m/s without AD    Time  8    Period  Weeks    Status  Achieved      PT LONG TERM GOAL #2   Title  Patient will be modified independent in walking on TM surface with walk aide for 20+ minutes without rest break, to improve walking tolerance and safety.     Baseline  able to walk 1.2-1.4 mph with 2 HHA for short distance; 8/4: able to walk at 1.2 mph without walkaide with heavy foot drag for approximately 3-4 min with fatigue; 9/14 Able to walk 1.5 mph without resistance for 6 minutes  with heavy foot drag, mild fatigue.    Time  8    Period  Weeks    Status  Partially Met    Target Date  08/30/19      PT LONG TERM GOAL #3   Title  Patient (< 22 years old) will complete five times sit to stand test in < 10 seconds indicating an increased LE strength and improved balance.    Baseline  requires 2HHA to push up from chair; 8/4: 11.2 sec without HHA, 9/14 9.8 sec without HHA.    Time  8    Period  Weeks    Status  Achieved      PT LONG TERM GOAL #4   Title  Patient will report decreased incidences of falls and stumbles to less than 1 a month to improve safety at home.     Baseline  2-4 instances of "close calls" has to grab mother to stabilize; 8/4: 3-4 x a month requiring HHA to avoid falls; 9/14 1 fall this month, 2-3 stumbles each day, able to catch self.    Time  8    Period  Weeks    Status  Partially Met    Target Date  08/30/19      PT LONG TERM GOAL #5   Title  Patient will increase BLE gross strength to 3/5 hip abduction  as to improve functional strength for independent gait, increased standing tolerance and increased ADL ability.    Baseline  9/14: R: 2+/5, L: 3-/5    Time  8    Period  Weeks    Status  Partially Met    Target Date  08/30/19      PT LONG TERM GOAL #6   Title  Patient will increase Berg Balance score by > 6 points to demonstrate decreased fall risk during functional activities.    Baseline  07/05/19: 27/56 high fall risk    Time  8    Period  Weeks    Status  Partially Met    Target Date  08/30/19      PT LONG TERM GOAL #7   Title  Patient will increase six minute walk test distance to >1000 for progression to community ambulator and improve gait ability    Time  8    Period  Weeks    Status  Partially Met    Target Date  08/30/19            Plan - 08/10/19 3903    Clinical Impression Statement  Patient presents to therapy stating that today will be her last visit as she is going out of town for the rest of the year and will  be unable to return until January. PT instructed patient in outcome measures to assess progress. Patient does exhibit improvement in balance as evidenced with berg balance assessment, although is still considered a high fall risk. She was re-educated in HEP including instructed in gym exercise in order for patient to progress strengthening and balance. She will be discharged at this time as she is unable to return.    Rehab Potential  Good    Clinical Impairments Affecting Rehab Potential  positive: motivated, good family support    PT Frequency  1x / week    PT Duration  8 weeks    PT Treatment/Interventions  Patient/family education;Manual techniques;Therapeutic activities;Therapeutic exercise;Electrical Stimulation;Aquatic Therapy;DME Instruction;Functional mobility training;Gait training;Neuromuscular re-education;ADLs/Self Care Home Management;Moist Heat;Stair training;Balance training;Orthotic Fit/Training    PT Next Visit Plan  walk aide with gait     PT Home Exercise Plan  --  consider high and low adductor stretchs   Consulted and Agree with Plan of Care  Patient    Family Member Consulted  mom       Patient will benefit from skilled therapeutic intervention in order to improve the following deficits and impairments:  Abnormal gait, Decreased balance, Decreased endurance, Decreased mobility, Difficulty walking, Decreased range of motion, Decreased activity tolerance, Decreased safety awareness, Decreased strength  Visit Diagnosis: Muscle weakness (generalized)  Unsteadiness on feet     Problem List Patient Active Problem List   Diagnosis Date Noted  . Loss of weight 07/17/2015  . Depression 07/17/2015  . Cerebral palsy ([place]) 07/17/2015    Alethia Melendrez PT, DPT 08/10/2019, 9:27 AM  [place] MAIN [facility] SERVICES [address] [place], [place], [address] Phone: [phone]   Fax:  [phone]  Name: Renee Walton MRN:  [id number] Date of Birth: [birth year]

## 2019-08-16 ENCOUNTER — Ambulatory Visit: Payer: Medicaid Other | Admitting: Physical Therapy

## 2019-08-23 ENCOUNTER — Ambulatory Visit: Payer: Medicaid Other | Admitting: Physical Therapy

## 2019-09-07 ENCOUNTER — Ambulatory Visit: Payer: Medicaid Other | Admitting: Physical Therapy

## 2019-09-20 ENCOUNTER — Ambulatory Visit: Payer: Medicaid Other | Admitting: Physical Therapy

## 2019-11-23 ENCOUNTER — Ambulatory Visit: Payer: Medicaid Other | Attending: Physical Medicine and Rehabilitation | Admitting: Physical Therapy

## 2019-11-23 ENCOUNTER — Other Ambulatory Visit: Payer: Self-pay

## 2019-11-23 ENCOUNTER — Encounter: Payer: Self-pay | Admitting: Physical Therapy

## 2019-11-23 DIAGNOSIS — R2681 Unsteadiness on feet: Secondary | ICD-10-CM | POA: Diagnosis present

## 2019-11-23 DIAGNOSIS — G809 Cerebral palsy, unspecified: Secondary | ICD-10-CM | POA: Insufficient documentation

## 2019-11-23 DIAGNOSIS — M6281 Muscle weakness (generalized): Secondary | ICD-10-CM | POA: Diagnosis present

## 2019-11-23 NOTE — Therapy (Signed)
[facility] MAIN [facility] SERVICES [address] [place], [place], [address] Phone: [phone]   Fax:  [phone]  Physical Therapy Evaluation  Patient Details  Name: Renee Walton MRN: [id number] Date of Birth: October 21, 1996 Referring Provider (PT): [place], [place]   Encounter Date: 11/23/2019  PT End of Session - 11/23/19 1408    Visit Number  1    Number of Visits  13    Date for PT Re-Evaluation  02/15/20    Authorization Type  medicaid    PT Start Time  1302    PT Stop Time  1355    PT Time Calculation (min)  53 min    Equipment Utilized During Treatment  Gait belt    Activity Tolerance  Patient tolerated treatment well;No increased pain    Behavior During Therapy  WFL for tasks assessed/performed       Past Medical History:  Diagnosis Date  . Breakthrough bleeding   . Cerebral palsy ([facility])   . GAD (generalized anxiety disorder)   . Migraine headache     Past Surgical History:  Procedure Laterality Date  . LEG SURGERY     bilateral x2 in 2008    There were no vitals filed for this visit.   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 Patient had outpatient PT from June-Oct 2020 to improve strength, balance and gait safety; She stopped in October since she was going to [place] for her cousin's wedding. She reports currently she is still walking without AD. She reports approximtely 2-3 falls each week. She reports her left knee started hurting after one of her falls. She has not followed up with her PCP at this time regarding knee pain; She reports adherence with HEP when her mom is able to help, She also still has a home health aide (4 hours a week). The Aide is helping with HEP, assists with  household chores, etc.    Limitations  Standing;Walking    How long can you sit comfortably?  NA    How long can you stand comfortably?  10 min    How long can you walk comfortably?  10-15 min, limited due to knee pain    Diagnostic tests  none recent    Patient Stated Goals  balance, improve safety and stability with walking.    Currently in Pain?  Yes    Pain Score  7     Pain Location  Knee    Pain Orientation  Left    Pain Descriptors / Indicators  Throbbing;Other (Comment)   vibration   Pain Type  Acute pain    Pain Onset  1 to 4 weeks ago    Pain Frequency  Intermittent    Aggravating Factors   cold weather, walking/weight bearing    Pain Relieving Factors  sitting/rest, massage    Effect of Pain on Daily Activities  decreased walking tolerance;    Multiple Pain Sites  No         [facility] PT Assessment - 11/23/19 0001      Assessment   Medical Diagnosis  CP    Referring Provider (PT)  RAUCH, KIMBERLY KARRAT    Onset Date/Surgical Date  --  onset at birth; recent knee pain 2 weeks ago   Hand Dominance  Left    Next MD Visit  --   none scheduled   Prior Therapy  had PT in 2020 from June-Oct with good tolerance/results; no recent PT for this condition      Precautions   Precautions  Fall      Restrictions   Weight Bearing Restrictions  No      Balance Screen   Has the patient fallen in the past 6 months  Yes    How many times?  --   2-3x a week   Has the patient had a decrease in activity level because of a fear of falling?   Yes    Is the patient reluctant to leave their home because of a fear of falling?   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    Leisure  pt still uses power chair prn at home and outside of home;       Cognition   Overall Cognitive Status  History of cognitive impairments - at baseline      Sensation   Light Touch  Appears Intact      Coordination   Gross Motor Movements are Fluid and Coordinated  No    Fine Motor Movements are Fluid and Coordinated  No      Posture/Postural Control   Posture Comments  sits with slumped posture when unsupported due to imbalance/tone;      Strength   Right Hip Flexion  3-/5    Right Hip Extension  1/5    Right Hip ABduction  2/5    Left Hip Flexion  3+/5    Left Hip Extension  2-/5    Left Hip ABduction  2/5    Right Knee Flexion  4-/5    Right Knee Extension  4-/5    Left Knee Flexion  4/5    Left Knee Extension  4+/5      Palpation   Patella mobility  hypermobility bilaterally; does exhibit significant clicking/popping when extending/flexing left knee;      Special Tests   Other special tests  (-) anterior/posterior drawer test bilaterally, does have joint mobility but no pain during laxity tests      Transfers   Comments  pt able to transfer sit<>Stand without pushing on chair, but requires heavy forward weight shift and UE reaching forward for balance      Ambulation/Gait   Gait Comments  pt ambulates without AD, requires min A for safety with increased hip ER/wide base of support, decreased ankle DF with increased foot drag with increased lateral trunk lean, arms outstretched for balance, short step length, slower gait speed  6 Minute Walk- Baseline   6 Minute Walk- Baseline  yes    BP (mmHg)  (!) 145/98    HR (bpm)  113    02 Sat (%RA)  100 %      6 Minute  walk- Post Test   6 Minute Walk Post Test  yes    BP (mmHg)  (!) 154/100    HR (bpm)  142    02 Sat (%RA)  100 %      6 minute walk test results    Aerobic Endurance Distance Walked  650    Endurance additional comments  with min A for balance/safety; patient had increased difficulty turning and requires several standing rest breaks; She had to stop test after 4.5 min due to fatigue/shortness of breath      Berg Balance Test   Sit to Stand  Able to stand  independently using hands    Standing Unsupported  Able to stand 2 minutes with supervision    Sitting with Back Unsupported but Feet Supported on Floor or Stool  Able to sit safely and securely 2 minutes    Stand to Sit  Controls descent by using hands    Transfers  Able to transfer safely, definite need of hands    Standing Unsupported with Eyes Closed  Able to stand 10 seconds with supervision    Standing Unsupported with Feet Together  Needs help to attain position but able to stand for 30 seconds with feet together    From Standing, Reach Forward with Outstretched Arm  Can reach forward >5 cm safely (2")    From Standing Position, Pick up Object from Floor  Unable to try/needs assist to keep balance    From Standing Position, Turn to Look Behind Over each Shoulder  Needs supervision when turning    Turn 360 Degrees  Needs assistance while turning    Standing Unsupported, Alternately Place Feet on Step/Stool  Needs assistance to keep from falling or unable to try    Standing Unsupported, One Foot in Front  Needs help to step but can hold 15 seconds    Standing on One Leg  Unable to try or needs assist to prevent fall    Total Score  24    Berg comment:  more impared than October 2020 which was 31/56                Objective measurements completed on examination: See above findings.              PT Education - 11/23/19 1408    Education provided  Yes    Education Details  recommendations/plan of care     Person(s) Educated  Patient;Parent(s)    Methods  Explanation    Comprehension  Verbalized understanding       PT Short Term Goals - 11/23/19 1417      PT SHORT TERM GOAL #1   Title  Patient will be adherent to HEP at least 3x a week to improve functional strength and balance for better safety at home.    Baseline  has HEP, doing it 2-3 times per week dependening on help from Aide/mom; unable to go to gym due to COVID    Time  4    Period  Weeks    Status  New    Target Date  12/21/19      PT SHORT TERM GOAL #2   Title  Patient will exhibit improved safety awareness in  the home reporting a maximum of 1 fall in last week.    Baseline  11/23/19: falls 3+ times a week    Time  4    Period  Weeks    Status  New    Target Date  12/21/19      PT SHORT TERM GOAL #3   Title  Pt will improve BLE hip abductor strength to 3-/5 for improved gluteal activation to improve stance control    Baseline  11/23/19: 2/5    Time  4    Period  Weeks    Status  New    Target Date  12/21/19        PT Long Term Goals - 11/23/19 1419      PT LONG TERM GOAL #1   Title  Patient will increase Berg Balance score by > 6 points to demonstrate decreased fall risk during functional activities.    Baseline  11/23/19: 24/56    Time  12    Period  Weeks    Status  New    Target Date  02/15/20      PT LONG TERM GOAL #2   Title  Patient will increase six minute walk test distance to >1000 feet without AD, supervision for progression to community ambulator and improve gait ability    Baseline  11/23/19: 650 feet with min A without AD, multiple stumbles    Time  12    Period  Weeks    Status  New    Target Date  02/15/20      PT LONG TERM GOAL #3   Title  Pt will improve BLE hip abductor strength to 3+/5 for better functional strength in standing and to improve stability and balance.    Baseline  11/23/19: 2/5 bilaterally;    Time  12    Period  Weeks    Status  New    Target Date  02/15/20      PT LONG  TERM GOAL #4   Title  Patient will be independent in bending down towards floor and picking up small object (<5 pounds) and then stand back up without loss of balance as to improve ability to pick up and clean up room at home    Baseline  11/23/19: able to bend down, but loses balance upon standing up;    Time  12    Period  Weeks    Status  New    Target Date  02/15/20      PT LONG TERM GOAL #5   Title  Patient will reduce timed up and go to <11 seconds to reduce fall risk and demonstrate improved transfer/gait ability.    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 Patient lives with her family and her mom is very active in assisting with her care. she reports approximately 2-3 falls per week with most difficulty when changing directions or stopping after walking. Patient tested as a high fall risk with low Berg Balance Assessment score. She has also become deconditioned with limitation with walking as evidenced with 6 min walk test. Patient exhibits weakness in  BLE (R>L) which limits standing balance and mobility. In addition, she complains of new onset of LLE knee pain as a result of recent fall. Recommend patient follow up with MD regarding knee pain for assessment. She would benefit from skilled PT intervention to improve strength, balance and gait safety;    Personal Factors and Comorbidities  Comorbidity 2;Fitness;Past/Current Experience;Time since onset of injury/illness/exacerbation;Age    Comorbidities  CP and anxiety;    Examination-Activity Limitations  Caring for Others;Carry;Lift;Locomotion Level;Reach Overhead;Squat;Stairs;Stand;Transfers    Examination-Participation Restrictions  Church;Cleaning;Community  Activity;Driving;Laundry;Medication Management;Meal Prep;Shop;Volunteer;Yard Work    Stability/Clinical Decision Making  Unstable/Unpredictable    Clinical Decision Making  High    Clinical Presentation due to:  walks with multiple falls; has had recent injury as a result of fall    Rehab Potential  Good    PT Frequency  1x / week    PT Duration  12 weeks    PT Treatment/Interventions  Patient/family education;Manual techniques;Therapeutic activities;Therapeutic exercise;Electrical Stimulation;Aquatic Therapy;DME Instruction;Functional mobility training;Gait training;Neuromuscular re-education;ADLs/Self Care Home Management;Cryotherapy;Moist Heat;Stair training;Orthotic Fit/Training;Energy conservation    PT Next Visit Plan  assess/adjust HEP, walking program, strength and balance    PT Home Exercise Plan  had HEP, will adjust next session;    Consulted and Agree with Plan of Care  Patient;Family member/caregiver    Family Member Consulted  mom       Patient will benefit from skilled therapeutic intervention in order to improve the following deficits and impairments:  Abnormal gait, Decreased balance, Decreased endurance, Decreased mobility, Difficulty walking, Cardiopulmonary status limiting activity, Decreased activity tolerance, Decreased safety awareness, Decreased strength  Visit Diagnosis: Muscle weakness (generalized)  Unsteadiness on feet  Cerebral palsy, unspecified type [facility])     Problem List Patient Active Problem List   Diagnosis Date Noted  . Loss of weight 07/17/2015  . Depression 07/17/2015  . Cerebral palsy ([facility]) 07/17/2015    Jobany Montellano PT, DPT 11/23/2019, 2:24 PM   [facility] MAIN [facility] SERVICES [address] [place], [place], [address] Phone: [phone]   Fax:  [phone]  Name: Renee Walton MRN: [id number] Date of Birth: 1996-10-07

## 2019-12-01 ENCOUNTER — Other Ambulatory Visit: Payer: Self-pay

## 2019-12-01 ENCOUNTER — Encounter: Payer: Self-pay | Admitting: Physical Therapy

## 2019-12-01 ENCOUNTER — Ambulatory Visit: Payer: Medicaid Other | Admitting: Physical Therapy

## 2019-12-01 DIAGNOSIS — M6281 Muscle weakness (generalized): Secondary | ICD-10-CM | POA: Diagnosis not present

## 2019-12-01 DIAGNOSIS — R2681 Unsteadiness on feet: Secondary | ICD-10-CM

## 2019-12-01 DIAGNOSIS — G809 Cerebral palsy, unspecified: Secondary | ICD-10-CM

## 2019-12-01 NOTE — Therapy (Signed)
[place] [facility] MAIN [facility] SERVICES [address] [place], [place], [address] Phone: [phone]   Fax:  [phone]  Physical Therapy Treatment  Patient Details  Name: Renee Walton MRN: [id number] Date of Birth: [birth year] Referring Provider (PT): [place], [place]   Encounter Date: 12/01/2019  PT End of Session - 12/01/19 1149    Visit Number  2    Number of Visits  13    Date for PT Re-Evaluation  02/15/20    Authorization Type  medicaid    Authorization Time Period  3 visits approved 2/10-2/23    Authorization - Visit Number  1    Authorization - Number of Visits  3    PT Start Time  1147    PT Stop Time  1230    PT Time Calculation (min)  43 min    Equipment Utilized During Treatment  Gait belt    Activity Tolerance  Patient tolerated treatment well;No increased pain    Behavior During Therapy  WFL for tasks assessed/performed       Past Medical History:  Diagnosis Date  . Breakthrough bleeding   . Cerebral palsy (HCC)   . GAD (generalized anxiety disorder)   . Migraine headache     Past Surgical History:  Procedure Laterality Date  . LEG SURGERY     bilateral x2 in 2008    There were no vitals filed for this visit.  Subjective Assessment - 12/01/19 1155    Subjective  pt reports doing well.  no pain.    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 Patient had outpatient PT from June-Oct 2020 to improve strength, balance and gait safety; She stopped in October since she was going to [place] for her cousin's wedding. She reports currently she is still walking without AD. She reports approximtely 2-3 falls each week. She reports her left knee started hurting after one of her falls. She has not followed up with her PCP at this time regarding knee pain; She reports adherence with HEP when her mom is able to  help, She also still has a home health aide (4 hours a week). The Aide is helping with HEP, assists with household chores, etc.    Limitations  Standing;Walking    How long can you sit comfortably?  NA    How long can you stand comfortably?  10 min    How long can you walk comfortably?  10-15 min, limited due to knee pain    Diagnostic tests  none recent    Patient Stated Goals  balance, improve safety and stability with walking.    Currently in Pain?  No/denies    Pain Onset  1 to 4 weeks ago             TREATMENT:  Warm up on Treadmill 1.5 mph with 2 HHA x5 min with cues to increase erect posture, reduce trunk lean, improve step length and increase DF at heel strike; patient exhibit increased foot drag on RLE with increased hip ER and decreased hip/knee flexion during swing with mild hip circumduction; She also exhibits heavy trunk lean due to poor gluteal activation and decreased hip/core stabilization in mid stance.   vitals after gait, HR 175 bpm, SPo2 100%  Patient hooklying: -SLR hamstring stretch with therapist holding down LLE for better stretch 30 sec  hold x2 reps; -passive single knee to chest stretch RLE, with contralateral LLE extension to disassociate LEs x20 sec hold x2 reps; -bridges with 2# bar chest press x15 reps with good hip ROM and motor control noted; -Single leg bridges in figure 4 position x15 reps each LE, 2# wand chest press to challenge hip control,  Required mod VCs to keep hips in neutral with added difficulty due to hip/core weakness;        Patient tall kneeling: Squat to tall kneeling x15 reps with intermittent HHA for balance; required mod VCs to increase RLE gluteal activation for improved neutral weight shift for better balance and strengthening; Increased fatigue noted with better weight shift;    Tall kneeling with red tband around BLE side stepping on knees x5 feet each direction with 2 HHA x2 set each with cues for weight shift and to increase  ROM for better strengthening; HR 128 following exercise;      Multiple directional stepping x5 reps sideways and backwards each LE with min A for safety, unsupported with cues for weight shift and upper trunk control; Pt does exhibit increased trunk lean due to gluteal weakness with decreased step length due to instability;   Response to treatment: Patient tolerated fair. She does require increased cues for correct exercise technique and position.She exhibits increased deconditioned with elevated HR with minimal exercise and mild shortness of breath. Vitals monitored, see above;  She would benefit from additional skilled PT to address HEP for strengthening and mobility;                     PT Education - 12/01/19 1149    Education provided  Yes    Education Details  LE strength, gait safety, HEP    Person(s) Educated  Patient;Parent(s)    Methods  Explanation;Verbal cues    Comprehension  Verbalized understanding;Returned demonstration;Verbal cues required;Need further instruction       PT Short Term Goals - 11/23/19 1417      PT SHORT TERM GOAL #1   Title  Patient will be adherent to HEP at least 3x a week to improve functional strength and balance for better safety at home.    Baseline  has HEP, doing it 2-3 times per week dependening on help from Aide/mom; unable to go to gym due to [place]    Time  4    Period  Weeks    Status  New    Target Date  12/21/19      PT SHORT TERM GOAL #2   Title  Patient will exhibit improved safety awareness in the home reporting a maximum of 1 fall in last week.    Baseline  11/23/19: falls 3+ times a week    Time  4    Period  Weeks    Status  New    Target Date  12/21/19      PT SHORT TERM GOAL #3   Title  Pt will improve BLE hip abductor strength to 3-/5 for improved gluteal activation to improve stance control    Baseline  11/23/19: 2/5    Time  4    Period  Weeks    Status  New    Target Date  12/21/19        PT Long  Term Goals - 11/23/19 1419      PT LONG TERM GOAL #1   Title  Patient will increase Berg Balance score by > 6 points to demonstrate  decreased fall risk during functional activities.    Baseline  11/23/19: 24/56    Time  12    Period  Weeks    Status  New    Target Date  02/15/20      PT LONG TERM GOAL #2   Title  Patient will increase six minute walk test distance to >1000 feet without AD, supervision for progression to community ambulator and improve gait ability    Baseline  11/23/19: 650 feet with min A without AD, multiple stumbles    Time  12    Period  Weeks    Status  New    Target Date  02/15/20      PT LONG TERM GOAL #3   Title  Pt will improve BLE hip abductor strength to 3+/5 for better functional strength in standing and to improve stability and balance.    Baseline  11/23/19: 2/5 bilaterally;    Time  12    Period  Weeks    Status  New    Target Date  02/15/20      PT LONG TERM GOAL #4   Title  Patient will be independent in bending down towards floor and picking up small object (<5 pounds) and then stand back up without loss of balance as to improve ability to pick up and clean up room at home    Baseline  11/23/19: able to bend down, but loses balance upon standing up;    Time  12    Period  Weeks    Status  New    Target Date  02/15/20      PT LONG TERM GOAL #5   Title  Patient will reduce timed up and go to <11 seconds to reduce fall risk and demonstrate improved transfer/gait ability.    Baseline  not assessed this session;    Time  12    Period  Weeks    Status  New    Target Date  02/15/20            Plan - 12/02/19 0836    Clinical Impression Statement  Patient presents to therapy with her home aide. Instructed patient in aide in LE strengthening exercise for better mobility. Patient does require min Vcs for proper exercise technique and positioning for optimal strengthening and muscle activation. She continues to have significant weakness in proximal  hip strength which leads to increased lateral trunk lean during gait. Educated patient to consider using walker at this time due to increased weakness to help reduce fall risk. Patient verbalized understanding. patient expressed desire to join fitness center for continued exercise between sessions. PT will reach out to see if this is possible due to COVID restrictions. Patient would benefit from additional skilled PT intervention to improve strength, balance and mobility;    Personal Factors and Comorbidities  Comorbidity 2;Fitness;Past/Current Experience;Time since onset of injury/illness/exacerbation;Age    Comorbidities  CP and anxiety;    Examination-Activity Limitations  Caring for Others;Carry;Lift;Locomotion Level;Reach Overhead;Squat;Stairs;Stand;Transfers    Examination-Participation Restrictions  Church;Cleaning;Community Activity;Driving;Laundry;Medication Management;Meal Prep;Shop;Volunteer;Yard Work    Stability/Clinical Decision Making  Unstable/Unpredictable    Rehab Potential  Good    PT Frequency  1x / week    PT Duration  12 weeks    PT Treatment/Interventions  Patient/family education;Manual techniques;Therapeutic activities;Therapeutic exercise;Electrical Stimulation;Aquatic Therapy;DME Instruction;Functional mobility training;Gait training;Neuromuscular re-education;ADLs/Self Care Home Management;Cryotherapy;Moist Heat;Stair training;Orthotic Fit/Training;Energy conservation    PT Next Visit Plan  assess/adjust HEP, walking program, strength and balance  PT Home Exercise Plan  had HEP, will adjust next session;    Consulted and Agree with Plan of Care  Patient;Family member/caregiver    Family Member Consulted  mom       Patient will benefit from skilled therapeutic intervention in order to improve the following deficits and impairments:  Abnormal gait, Decreased balance, Decreased endurance, Decreased mobility, Difficulty walking, Cardiopulmonary status limiting activity,  Decreased activity tolerance, Decreased safety awareness, Decreased strength  Visit Diagnosis: Muscle weakness (generalized)  Unsteadiness on feet  Cerebral palsy, unspecified type [facility])     Problem List Patient Active Problem List   Diagnosis Date Noted  . Loss of weight 07/17/2015  . Depression 07/17/2015  . Cerebral palsy (HCC) 07/17/2015    Renee Walton PT, DPT 12/02/2019, 8:41 AM  [place] [facility] MAIN [facility] SERVICES [address] [place], [place], [address] Phone: [phone]   Fax:  [phone]  Name: Renee Walton MRN: [id number] Date of Birth: 11-May-1996

## 2019-12-06 ENCOUNTER — Ambulatory Visit: Payer: Medicaid Other | Admitting: Physical Therapy

## 2019-12-06 ENCOUNTER — Encounter: Payer: Self-pay | Admitting: Physical Therapy

## 2019-12-06 ENCOUNTER — Other Ambulatory Visit: Payer: Self-pay

## 2019-12-06 DIAGNOSIS — R2681 Unsteadiness on feet: Secondary | ICD-10-CM

## 2019-12-06 DIAGNOSIS — M6281 Muscle weakness (generalized): Secondary | ICD-10-CM

## 2019-12-06 NOTE — Therapy (Signed)
[place] [facility] MAIN [facility] SERVICES [address] [place], [place], [address] Phone: [phone]   Fax:  [phone]  Physical Therapy Treatment  Patient Details  Name: Renee Walton MRN: [id number] Date of Birth: 1996-04-20 Referring Provider (PT): [place], [place]   Encounter Date: 12/06/2019  PT End of Session - 12/06/19 1147    Visit Number  3    Number of Visits  13    Date for PT Re-Evaluation  02/15/20    Authorization Type  medicaid    Authorization Time Period  3 visits approved 2/10-2/23    Authorization - Visit Number  2    Authorization - Number of Visits  3    PT Start Time  1146    PT Stop Time  1230    PT Time Calculation (min)  44 min    Equipment Utilized During Treatment  Gait belt    Activity Tolerance  Patient tolerated treatment well;No increased pain    Behavior During Therapy  WFL for tasks assessed/performed       Past Medical History:  Diagnosis Date  . Breakthrough bleeding   . Cerebral palsy (HCC)   . GAD (generalized anxiety disorder)   . Migraine headache     Past Surgical History:  Procedure Laterality Date  . LEG SURGERY     bilateral x2 in 2008    There were no vitals filed for this visit.  Subjective Assessment - 12/06/19 1153    Subjective  Patient reports doing well; Denies any pain; Reports working on exercise some at home. 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 Patient had outpatient PT from June-Oct 2020 to improve strength, balance and gait safety; She stopped in October since she was going to [place] for her cousin's wedding. She reports currently she is still walking without AD. She reports approximtely 2-3 falls each week. She reports her left knee started hurting after one of her falls. She has not followed up with her PCP at this time  regarding knee pain; She reports adherence with HEP when her mom is able to help, She also still has a home health aide (4 hours a week). The Aide is helping with HEP, assists with household chores, etc.    Limitations  Standing;Walking    How long can you sit comfortably?  NA    How long can you stand comfortably?  10 min    How long can you walk comfortably?  10-15 min, limited due to knee pain    Diagnostic tests  none recent    Patient Stated Goals  balance, improve safety and stability with walking.    Currently in Pain?  No/denies    Pain Onset  1 to 4 weeks ago    Multiple Pain Sites  No             TREATMENT: Warm up on Treadmill 1.5 mph with 2 HHA x5 min with cues to increase erect posture, reduce trunk lean, improve step length and increase DF at heel strike; patient exhibit increased foot drag on RLE with increased hip ER and decreased hip/knee flexion during swing with mild hip circumduction; She also exhibits heavy trunk lean due to poor gluteal activation and decreased hip/core stabilization in mid stance. vitals after gait,HR 154 bpm, SPo2 99%  Leg press: BLE plate 81# 1B14 with min VCs for proper positioning and exercise technique for optimal strengthening; Utilized small ball between knees for less valgus position;   Patient hooklying: -Passive piriformis stretch 20 sec hold x1 rep bilaterally; -Single leg bridges in figure 4 position x15 reps each LE,BUE arm lift to challenge hip control, Required mod VCs to keep hips in neutral with added difficulty due to hip/core weakness;  -SLR hip flexion with cues for knee extension to challenge hip strength and motor control x10 reps bilaterally; Required min A to stabalize contralateral leg; Patient unable to keep knee straight due to weakness; -BLE hip abduction/ER yellow tband x15 reps with min VC for proper exercise technique; unable to achieve full ROM due to weakness and hip adductor tone;    Response to  treatment: Patient toleratedfair. She does require increased cues for correct exercise technique and position.She exhibits increased deconditioned with elevated HR with minimal exercise and mild shortness of breath. Vitals monitored, see above;  She would benefit from additional skilled PT to address HEP for strengthening and mobility;                     PT Education - 12/06/19 1147    Education provided  Yes    Education Details  LE strength, balance, gait safety HEP    Person(s) Educated  Patient;Parent(s)    Methods  Explanation;Verbal cues    Comprehension  Verbalized understanding;Returned demonstration;Verbal cues required;Need further instruction       PT Short Term Goals - 11/23/19 1417      PT SHORT TERM GOAL #1   Title  Patient will be adherent to HEP at least 3x a week to improve functional strength and balance for better safety at home.    Baseline  has HEP, doing it 2-3 times per week dependening on help from Aide/mom; unable to go to gym due to COVID    Time  4    Period  Weeks    Status  New    Target Date  12/21/19      PT SHORT TERM GOAL #2   Title  Patient will exhibit improved safety awareness in the home reporting a maximum of 1 fall in last week.    Baseline  11/23/19: falls 3+ times a week    Time  4    Period  Weeks    Status  New    Target Date  12/21/19      PT SHORT TERM GOAL #3   Title  Pt will improve BLE hip abductor strength to 3-/5 for improved gluteal activation to improve stance control    Baseline  11/23/19: 2/5    Time  4    Period  Weeks    Status  New    Target Date  12/21/19        PT Long Term Goals - 11/23/19 1419      PT LONG TERM GOAL #1   Title  Patient will increase Berg Balance score by > 6 points to demonstrate decreased fall risk during functional activities.    Baseline  11/23/19: 24/56    Time  12    Period  Weeks    Status  New    Target Date  02/15/20      PT LONG TERM GOAL #2   Title  Patient  will increase six minute walk test distance to >1000 feet without AD, supervision for progression to community ambulator and  improve gait ability    Baseline  11/23/19: 650 feet with min A without AD, multiple stumbles    Time  12    Period  Weeks    Status  New    Target Date  02/15/20      PT LONG TERM GOAL #3   Title  Pt will improve BLE hip abductor strength to 3+/5 for better functional strength in standing and to improve stability and balance.    Baseline  11/23/19: 2/5 bilaterally;    Time  12    Period  Weeks    Status  New    Target Date  02/15/20      PT LONG TERM GOAL #4   Title  Patient will be independent in bending down towards floor and picking up small object (<5 pounds) and then stand back up without loss of balance as to improve ability to pick up and clean up room at home    Baseline  11/23/19: able to bend down, but loses balance upon standing up;    Time  12    Period  Weeks    Status  New    Target Date  02/15/20      PT LONG TERM GOAL #5   Title  Patient will reduce timed up and go to <11 seconds to reduce fall risk and demonstrate improved transfer/gait ability.    Baseline  not assessed this session;    Time  12    Period  Weeks    Status  New    Target Date  02/15/20            Plan - 12/07/19 1240    Clinical Impression Statement  Patient motivated and tolerated session well. Instructed patient in advanced LE strengthening exercise. She does require min Vcs for proper exercise technique and positioning. Patient continues to have increased hip adductor tone making hip abduction/gluteal activation difficult. Patient does report increased fatigue at end of session. She would benefit from additional skilled PT intervention to improve strength, balance and gait safety;    Personal Factors and Comorbidities  Comorbidity 2;Fitness;Past/Current Experience;Time since onset of injury/illness/exacerbation;Age    Comorbidities  CP and anxiety;     Examination-Activity Limitations  Caring for Others;Carry;Lift;Locomotion Level;Reach Overhead;Squat;Stairs;Stand;Transfers    Examination-Participation Restrictions  Church;Cleaning;Community Activity;Driving;Laundry;Medication Management;Meal Prep;Shop;Volunteer;Yard Work    Stability/Clinical Decision Making  Unstable/Unpredictable    Rehab Potential  Good    PT Frequency  1x / week    PT Duration  12 weeks    PT Treatment/Interventions  Patient/family education;Manual techniques;Therapeutic activities;Therapeutic exercise;Electrical Stimulation;Aquatic Therapy;DME Instruction;Functional mobility training;Gait training;Neuromuscular re-education;ADLs/Self Care Home Management;Cryotherapy;Moist Heat;Stair training;Orthotic Fit/Training;Energy conservation    PT Next Visit Plan  assess/adjust HEP, walking program, strength and balance    PT Home Exercise Plan  had HEP, will adjust next session;    Consulted and Agree with Plan of Care  Patient;Family member/caregiver    Family Member Consulted  mom       Patient will benefit from skilled therapeutic intervention in order to improve the following deficits and impairments:  Abnormal gait, Decreased balance, Decreased endurance, Decreased mobility, Difficulty walking, Cardiopulmonary status limiting activity, Decreased activity tolerance, Decreased safety awareness, Decreased strength  Visit Diagnosis: Muscle weakness (generalized)  Unsteadiness on feet     Problem List Patient Active Problem List   Diagnosis Date Noted  . Loss of weight 07/17/2015  . Depression 07/17/2015  . Cerebral palsy (HCC) 07/17/2015    Benjie Ricketson PT, DPT 12/07/2019,  12:42 PM  [place] MAIN [facility] SERVICES [address] [place], [place], [address] Phone: [phone]   Fax:  [phone]  Name: Renee Walton MRN: [id number] Date of Birth: [birth year]

## 2019-12-13 ENCOUNTER — Ambulatory Visit: Payer: Medicaid Other | Admitting: Physical Therapy

## 2019-12-13 ENCOUNTER — Encounter: Payer: Self-pay | Admitting: Physical Therapy

## 2019-12-13 ENCOUNTER — Other Ambulatory Visit: Payer: Self-pay

## 2019-12-13 DIAGNOSIS — M6281 Muscle weakness (generalized): Secondary | ICD-10-CM | POA: Diagnosis not present

## 2019-12-13 DIAGNOSIS — R2681 Unsteadiness on feet: Secondary | ICD-10-CM

## 2019-12-13 NOTE — Therapy (Signed)
[place] MAIN [facility] SERVICES [address] [place], [place], [address] Phone: [phone]   Fax:  [phone]  Physical Therapy Treatment  Patient Details  Name: Renee Walton MRN: [id number] Date of Birth: [birth year] Referring Provider (PT): [place], [place]   Encounter Date: 12/13/2019  PT End of Session - 12/13/19 1256    Visit Number  4    Number of Visits  13    Date for PT Re-Evaluation  02/15/20    Authorization Type  medicaid    Authorization Time Period  3 visits approved 2/10-2/23    Authorization - Visit Number  3    Authorization - Number of Visits  3    PT Start Time  8811    PT Stop Time  1228    PT Time Calculation (min)  41 min    Equipment Utilized During Treatment  Gait belt    Activity Tolerance  Patient tolerated treatment well;No increased pain    Behavior During Therapy  WFL for tasks assessed/performed       Past Medical History:  Diagnosis Date  . Breakthrough bleeding   . Cerebral palsy ([place])   . GAD (generalized anxiety disorder)   . Migraine headache     Past Surgical History:  Procedure Laterality Date  . LEG SURGERY     bilateral x2 in 2008    There were no vitals filed for this visit.  Subjective Assessment - 12/13/19 1256    Subjective  Patient reports doing well; No new falls this week. Reports working on HEP at least 2x in last week.    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 Patient had outpatient PT from [place] to improve strength, balance and gait safety; She stopped in October since she was going to [place] for her cousin's wedding. She reports currently she is still walking without AD. She reports approximtely 2-3 falls each week. She reports her left knee started hurting after one of her falls. She has not followed up with her PCP at this time  regarding knee pain; She reports adherence with HEP when her mom is able to help, She also still has a home health aide (4 hours a week). The Aide is helping with HEP, assists with household chores, etc.    Limitations  Standing;Walking    How long can you sit comfortably?  NA    How long can you stand comfortably?  10 min    How long can you walk comfortably?  10-15 min, limited due to knee pain    Diagnostic tests  none recent    Patient Stated Goals  balance, improve safety and stability with walking.    Currently in Pain?  No/denies    Pain Onset  1 to 4 weeks ago    Multiple Pain Sites  No         [facility] PT Assessment - 12/13/19 0001      Observation/Other Assessments   Focus on Therapeutic Outcomes (FOTO)   58/100    Activities of Balance Confidence Scale (ABC Scale)   31.9%      6 minute walk test results    Aerobic Endurance Distance Walked  950    Endurance additional comments  with min A for balance, increased scissoring and lateral lean noted with increased fatigue  Berg Balance Test   Sit to Stand  Able to stand  independently using hands    Standing Unsupported  Able to stand 2 minutes with supervision    Sitting with Back Unsupported but Feet Supported on Floor or Stool  Able to sit safely and securely 2 minutes    Stand to Sit  Controls descent by using hands    Transfers  Able to transfer safely, definite need of hands    Standing Unsupported with Eyes Closed  Able to stand 3 seconds    Standing Unsupported with Feet Together  Able to place feet together independently but unable to hold for 30 seconds    From Standing, Reach Forward with Outstretched Arm  Can reach forward >5 cm safely (2")    From Standing Position, Pick up Object from Floor  Unable to try/needs assist to keep balance    From Standing Position, Turn to Look Behind Over each Shoulder  Looks behind one side only/other side shows less weight shift    Turn 360 Degrees  Needs assistance while turning     Standing Unsupported, Alternately Place Feet on Step/Stool  Needs assistance to keep from falling or unable to try    Standing Unsupported, One Foot in Front  Needs help to step but can hold 15 seconds    Standing on One Leg  Unable to try or needs assist to prevent fall    Total Score  26    Berg comment:  slight improved from 24/56 on 11/23/19, <36/56 indicates high fall risk      Timed Up and Go Test   Normal TUG (seconds)  16.5    TUG Comments  without AD, min A for safety       TREATMENT: PT instructed patient in 6 min walk, timed up and go, berg balance assessment, etc to address goals. See above;  Patient does require min A with most walking without AD with increased unsteadiness/weaving noted especially with fatigue.   Reinforced HEP with instruction to continue with LE strengthening and stretches;                     PT Education - 12/13/19 1256    Education provided  Yes    Education Details  progress towards goals, HEP    Person(s) Educated  Patient    Methods  Explanation;Verbal cues    Comprehension  Verbalized understanding;Returned demonstration;Verbal cues required;Need further instruction       PT Short Term Goals - 12/13/19 1220      PT SHORT TERM GOAL #1   Title  Patient will be adherent to HEP at least 3x a week to improve functional strength and balance for better safety at home.    Baseline  has HEP, doing it 2-3 times per week dependening on help from Aide/mom; unable to go to gym due to [place], 12/13/19: 2 days    Time  4    Period  Weeks    Status  Partially Met    Target Date  12/21/19      PT SHORT TERM GOAL #2   Title  Patient will exhibit improved safety awareness in the home reporting a maximum of 1 fall in last week.    Baseline  11/23/19: falls 3+ times a week, 12/13/19: no fall this past week    Time  4    Period  Weeks    Status  Achieved    Target Date  12/21/19  PT SHORT TERM GOAL #3   Title  Pt will improve BLE hip  abductor strength to 3-/5 for improved gluteal activation to improve stance control    Baseline  11/23/19: 2/5, 12/13/19: 2/5    Time  4    Period  Weeks    Status  Not Met    Target Date  12/21/19        PT Long Term Goals - 12/13/19 1221      PT LONG TERM GOAL #1   Title  Patient will increase Berg Balance score by > 6 points to demonstrate decreased fall risk during functional activities.    Baseline  11/23/19: 24/56, 12/13/19: 26/56    Time  12    Period  Weeks    Status  Partially Met    Target Date  02/15/20      PT LONG TERM GOAL #2   Title  Patient will increase six minute walk test distance to >1000 feet without AD, supervision for progression to community ambulator and improve gait ability    Baseline  11/23/19: 650 feet with min A without AD, multiple stumbles, 12/13/19: 950 feet with min A, no AD, multiple stumbles    Time  12    Period  Weeks    Status  Partially Met    Target Date  02/15/20      PT LONG TERM GOAL #3   Title  Pt will improve BLE hip abductor strength to 3+/5 for better functional strength in standing and to improve stability and balance.    Baseline  11/23/19: 2/5 bilaterally; 12/13/19: 2/5 bilaterally    Time  12    Period  Weeks    Status  Not Met    Target Date  02/15/20      PT LONG TERM GOAL #4   Title  Patient will be independent in bending down towards floor and picking up small object (<5 pounds) and then stand back up without loss of balance as to improve ability to pick up and clean up room at home    Baseline  11/23/19: able to bend down, but loses balance upon standing up; 12/13/19: no change    Time  12    Period  Weeks    Status  Not Met    Target Date  02/15/20      PT LONG TERM GOAL #5   Title  Patient will reduce timed up and go to <11 seconds to reduce fall risk and demonstrate improved transfer/gait ability.    Baseline  12/13/19: 16.5 sec without AD, min A    Time  12    Period  Weeks    Status  Not Met    Target Date  02/15/20             Plan - 12/13/19 1257    Clinical Impression Statement  Patient motivated and tolerated session well. She is making some improvements towards goals. Patient does exhibit improved gait ability with longer endurance, however continues to require min A for balance. She exhibits increased weaving/unsteadiness with increased fatigue. Patient continues to have increased LE weakness. She did exhibit mild improvement in balance per [facility] Assessment, but is still considered a high fall risk. She has been working on her HEP at home with her aide. Her mom/caregiver was helping but has been uanble to help lately as she hurt her back. Therefore patient has had less assistance at home. Patient would benefit from additional skilled PT  Intervention to improve strength, balance and mobility;    Personal Factors and Comorbidities  Comorbidity 2;Fitness;Past/Current Experience;Time since onset of injury/illness/exacerbation;Age    Comorbidities  CP and anxiety;    Examination-Activity Limitations  Caring for Others;Carry;Lift;Locomotion Level;Reach Overhead;Squat;Stairs;Stand;Transfers    Examination-Participation Restrictions  Church;Cleaning;Community Activity;Driving;Laundry;Medication Management;Meal Prep;Shop;Volunteer;Yard Work    Stability/Clinical Decision Making  Unstable/Unpredictable    Rehab Potential  Good    PT Frequency  1x / week    PT Duration  12 weeks    PT Treatment/Interventions  Patient/family education;Manual techniques;Therapeutic activities;Therapeutic exercise;Electrical Stimulation;Aquatic Therapy;DME Instruction;Functional mobility training;Gait training;Neuromuscular re-education;ADLs/Self Care Home Management;Cryotherapy;Moist Heat;Stair training;Orthotic Fit/Training;Energy conservation    PT Next Visit Plan  assess/adjust HEP, walking program, strength and balance    PT Home Exercise Plan  had HEP, will adjust next session;    Consulted and Agree with Plan of Care   Patient;Family member/caregiver    Family Member Consulted  mom       Patient will benefit from skilled therapeutic intervention in order to improve the following deficits and impairments:  Abnormal gait, Decreased balance, Decreased endurance, Decreased mobility, Difficulty walking, Cardiopulmonary status limiting activity, Decreased activity tolerance, Decreased safety awareness, Decreased strength  Visit Diagnosis: Muscle weakness (generalized)  Unsteadiness on feet     Problem List Patient Active Problem List   Diagnosis Date Noted  . Loss of weight 07/17/2015  . Depression 07/17/2015  . Cerebral palsy ([place]) 07/17/2015    Nayda Riesen PT, DPT 12/13/2019, 1:12 PM  [place] MAIN [facility] SERVICES [address] [place], [place], [address] Phone: [phone]   Fax:  [phone]  Name: Renee Walton MRN: [id number] Date of Birth: 1995/10/28

## 2019-12-15 ENCOUNTER — Ambulatory Visit: Payer: Medicaid Other | Admitting: Physical Therapy

## 2019-12-22 ENCOUNTER — Ambulatory Visit: Payer: Medicaid Other | Attending: Physical Medicine and Rehabilitation | Admitting: Physical Therapy

## 2019-12-22 ENCOUNTER — Other Ambulatory Visit: Payer: Self-pay

## 2019-12-22 ENCOUNTER — Encounter: Payer: Self-pay | Admitting: Physical Therapy

## 2019-12-22 DIAGNOSIS — M6281 Muscle weakness (generalized): Secondary | ICD-10-CM | POA: Insufficient documentation

## 2019-12-22 DIAGNOSIS — R2681 Unsteadiness on feet: Secondary | ICD-10-CM | POA: Diagnosis present

## 2019-12-22 NOTE — Therapy (Signed)
[place] MAIN [facility] SERVICES [address] [place], [place], [address] Phone: [phone]   Fax:  [phone]  Physical Therapy Treatment  Patient Details  Name: Renee Walton MRN: [id number] Date of Birth: [birth year] Referring Provider (PT): [place], [place]   Encounter Date: 12/22/2019  PT End of Session - 12/22/19 1143    Visit Number  5    Number of Visits  13    Date for PT Re-Evaluation  02/15/20    Authorization Type  medicaid    Authorization Time Period  12 visits approved, 3/3-5/25    Authorization - Visit Number  1    Authorization - Number of Visits  12    PT Start Time  6270    PT Stop Time  1230    PT Time Calculation (min)  45 min    Equipment Utilized During Treatment  Gait belt    Activity Tolerance  Patient tolerated treatment well;No increased pain    Behavior During Therapy  WFL for tasks assessed/performed       Past Medical History:  Diagnosis Date  . Breakthrough bleeding   . Cerebral palsy ([place])   . GAD (generalized anxiety disorder)   . Migraine headache     Past Surgical History:  Procedure Laterality Date  . LEG SURGERY     bilateral x2 in 2008    There were no vitals filed for this visit.  Subjective Assessment - 12/22/19 1150    Subjective  Patient reports doing well; Reports joining the gym and exercising yesterday. She reports adherence with HEP. She reports mild soreness in thighs after doing leg press yesterday.    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 Patient had outpatient PT from [place] to improve strength, balance and gait safety; She stopped in October since she was going to [place] her cousin's wedding. She reports currently she is still walking without AD. She reports approximtely 2-3 falls each week. She reports her left knee started hurting  after one of her falls. She has not followed up with her PCP at this time regarding knee pain; She reports adherence with HEP when her mom is able to help, She also still has a home health aide (4 hours a week). The Aide is helping with HEP, assists with household chores, etc.    Limitations  Standing;Walking    How long can you sit comfortably?  NA    How long can you stand comfortably?  10 min    How long can you walk comfortably?  10-15 min, limited due to knee pain    Diagnostic tests  none recent    Patient Stated Goals  balance, improve safety and stability with walking.    Currently in Pain?  No/denies    Pain Onset  1 to 4 weeks ago    Multiple Pain Sites  No           TREATMENT: Warm up on Treadmill 1.5 mph with 2 HHA x5 min with cues to increase erect posture, reduce trunk lean, improve step length and increase DF at heel strike; patient exhibit increased foot drag on RLE with increased hip ER and decreased hip/knee flexion during swing with mild hip circumduction; She also exhibits heavy trunk lean due to poor gluteal activation and decreased hip/core stabilization in mid stance. vitals  after gait,HR 145bpm, SPo2 99%   Instructed patient in balance exercise:  Standing on airex: -feet apart, facing forward, arms to side, eyes open 30 sec hold, eyes closed 15 sec hold, CGA for safety;  -feet together, facing forward with arms across chest 30 sec hold x2 sets  -feet together, arms across chest,head turnsside/side, x10reps with mod VCs for erect posture and to improve gluteal activation for less trendelenburg and better hip control/trunk control; -Feet apart: mini squat and reaching down to touch 4 inch step with fingers and then standing back up x10 reps;    Standing in open space:  Marching unsupported with intermittent stopping to challenge dynamic balance x5 min with approximately 6 stops. Initially patient required min A for balance control; she exhibits  forward loss of balance; Instructed patient to keep head erect to reduce forward weight shift to improve balance recovery; with increased repetition patient able to exhibit improved stance control with less instability with starting/stopping;   Patient often compensates for loss of balance with upper trunk lean and hip ER to widen base of support. She required cues to keep feet oriented forward for less hip ER to challenge gluteal activation and improved stance control.   Weaving around cones #4 x4 sets with min A to [place] for safety; Patient required cues to increase step length and improve turning;  She did have difficulty with cones close together with increased instability with tight turns.   Walking forward picking up cones with patient stopping and slowly picking up and then stopping upon standing x4 reps with CGA for safety; Patient able to keep balance well with CGA unsupported;   Resisted walking 7.5# forward/backward walking x2 laps with min A for safety and 1 HHA with min VCs to increase weight shift and increase step length for better gait safety; Patient exhibits increased lateral trunk lean with unsteadiness with advanced gait training;   Exercise: Sit<>Stand while holding ball to challenge LE control x10 reps with good weight shift during transfer; She does have trouble slowing down sit down due to weakness with eccentric lower.   Response to treatment: Patient toleratedwell. She continues to compensate for imbalance with widened base of support/hip ER for better stance control. Patient does exhibit better dynamic balance control with starting/stopping unsupported with less instability; She does continue to have significant LE weakness particularly in hips which leads to compensation of stance position due to poor gluteal activation;                      PT Education - 12/22/19 1143    Education provided  Yes    Education Details  LE strength, balance,  HEP    Person(s) Educated  Patient    Methods  Explanation;Verbal cues    Comprehension  Verbalized understanding;Returned demonstration;Verbal cues required;Need further instruction       PT Short Term Goals - 12/13/19 1220      PT SHORT TERM GOAL #1   Title  Patient will be adherent to HEP at least 3x a week to improve functional strength and balance for better safety at home.    Baseline  has HEP, doing it 2-3 times per week dependening on help from Aide/mom; unable to go to gym due to [place], 12/13/19: 2 days    Time  4    Period  Weeks    Status  Partially Met    Target Date  12/21/19      PT SHORT TERM GOAL #  2   Title  Patient will exhibit improved safety awareness in the home reporting a maximum of 1 fall in last week.    Baseline  11/23/19: falls 3+ times a week, 12/13/19: no fall this past week    Time  4    Period  Weeks    Status  Achieved    Target Date  12/21/19      PT SHORT TERM GOAL #3   Title  Pt will improve BLE hip abductor strength to 3-/5 for improved gluteal activation to improve stance control    Baseline  11/23/19: 2/5, 12/13/19: 2/5    Time  4    Period  Weeks    Status  Not Met    Target Date  12/21/19        PT Long Term Goals - 12/13/19 1221      PT LONG TERM GOAL #1   Title  Patient will increase Berg Balance score by > 6 points to demonstrate decreased fall risk during functional activities.    Baseline  11/23/19: 24/56, 12/13/19: 26/56    Time  12    Period  Weeks    Status  Partially Met    Target Date  02/15/20      PT LONG TERM GOAL #2   Title  Patient will increase six minute walk test distance to >1000 feet without AD, supervision for progression to community ambulator and improve gait ability    Baseline  11/23/19: 650 feet with min A without AD, multiple stumbles, 12/13/19: 950 feet with min A, no AD, multiple stumbles    Time  12    Period  Weeks    Status  Partially Met    Target Date  02/15/20      PT LONG TERM GOAL #3   Title  Pt will  improve BLE hip abductor strength to 3+/5 for better functional strength in standing and to improve stability and balance.    Baseline  11/23/19: 2/5 bilaterally; 12/13/19: 2/5 bilaterally    Time  12    Period  Weeks    Status  Not Met    Target Date  02/15/20      PT LONG TERM GOAL #4   Title  Patient will be independent in bending down towards floor and picking up small object (<5 pounds) and then stand back up without loss of balance as to improve ability to pick up and clean up room at home    Baseline  11/23/19: able to bend down, but loses balance upon standing up; 12/13/19: no change    Time  12    Period  Weeks    Status  Not Met    Target Date  02/15/20      PT LONG TERM GOAL #5   Title  Patient will reduce timed up and go to <11 seconds to reduce fall risk and demonstrate improved transfer/gait ability.    Baseline  12/13/19: 16.5 sec without AD, min A    Time  12    Period  Weeks    Status  Not Met    Target Date  02/15/20            Plan - 12/22/19 1255    Clinical Impression Statement  Patient motivated and tolerated session well. She has joined a [employer] and has started using equipment. Focused session on balance instruction to improve static/dynamic balance. She required min A for most balance exercise and min VCs  for proper weight shift/stance control. Patient reports increased fatigue at end of session> She would benefit from additional skilled PT intervention to improve strength, balance and mobility;    Personal Factors and Comorbidities  Comorbidity 2;Fitness;Past/Current Experience;Time since onset of injury/illness/exacerbation;Age    Comorbidities  CP and anxiety;    Examination-Activity Limitations  Caring for Others;Carry;Lift;Locomotion Level;Reach Overhead;Squat;Stairs;Stand;Transfers    Examination-Participation Restrictions  Church;Cleaning;Community Activity;Driving;Laundry;Medication Management;Meal Prep;Shop;Volunteer;Yard Work    Stability/Clinical  Decision Making  Unstable/Unpredictable    Rehab Potential  Good    PT Frequency  1x / week    PT Duration  12 weeks    PT Treatment/Interventions  Patient/family education;Manual techniques;Therapeutic activities;Therapeutic exercise;Electrical Stimulation;Aquatic Therapy;DME Instruction;Functional mobility training;Gait training;Neuromuscular re-education;ADLs/Self Care Home Management;Cryotherapy;Moist Heat;Stair training;Orthotic Fit/Training;Energy conservation    PT Next Visit Plan  assess/adjust HEP, walking program, strength and balance    PT Home Exercise Plan  had HEP, will adjust next session;    Consulted and Agree with Plan of Care  Patient;Family member/caregiver    Family Member Consulted  mom       Patient will benefit from skilled therapeutic intervention in order to improve the following deficits and impairments:  Abnormal gait, Decreased balance, Decreased endurance, Decreased mobility, Difficulty walking, Cardiopulmonary status limiting activity, Decreased activity tolerance, Decreased safety awareness, Decreased strength  Visit Diagnosis: Muscle weakness (generalized)  Unsteadiness on feet     Problem List Patient Active Problem List   Diagnosis Date Noted  . Loss of weight 07/17/2015  . Depression 07/17/2015  . Cerebral palsy ([place]) 07/17/2015    Jla Reynolds PT, DPT 12/22/2019, 12:57 PM  [place] MAIN [facility] SERVICES [address] [place], [place], [address] Phone: [phone]   Fax:  [phone]  Name: Renee Walton MRN: [id number] Date of Birth: September 06, 1996

## 2019-12-29 ENCOUNTER — Ambulatory Visit: Payer: Medicaid Other | Admitting: Physical Therapy

## 2019-12-29 ENCOUNTER — Other Ambulatory Visit: Payer: Self-pay

## 2019-12-29 ENCOUNTER — Encounter: Payer: Self-pay | Admitting: Physical Therapy

## 2019-12-29 DIAGNOSIS — M6281 Muscle weakness (generalized): Secondary | ICD-10-CM

## 2019-12-29 DIAGNOSIS — R2681 Unsteadiness on feet: Secondary | ICD-10-CM

## 2019-12-29 NOTE — Therapy (Signed)
[place] MAIN [facility] SERVICES [address] [place], [place], [address] Phone: [phone]   Fax:  [phone]  Physical Therapy Treatment  Patient Details  Name: Renee Walton MRN: [id number] Date of Birth: April 04, 1996 Referring Provider (PT): [place], [place]   Encounter Date: 12/29/2019  PT End of Session - 12/29/19 1140    Visit Number  6    Number of Visits  13    Date for PT Re-Evaluation  02/15/20    Authorization Type  medicaid    Authorization Time Period  12 visits approved, 3/3-5/25    Authorization - Visit Number  2    Authorization - Number of Visits  12    PT Start Time  8325    PT Stop Time  1230    PT Time Calculation (min)  48 min    Equipment Utilized During Treatment  Gait belt    Activity Tolerance  Patient tolerated treatment well;No increased pain    Behavior During Therapy  WFL for tasks assessed/performed       Past Medical History:  Diagnosis Date  . Breakthrough bleeding   . Cerebral palsy ([place])   . GAD (generalized anxiety disorder)   . Migraine headache     Past Surgical History:  Procedure Laterality Date  . LEG SURGERY     bilateral x2 in 2008    There were no vitals filed for this visit.  Subjective Assessment - 12/29/19 1149    Subjective  Patient reports doing well; She reports still exercising at the gym. She reports falling this morning. She was going to the bathroom. She was going too fast. 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 Patient had outpatient PT from [place] to improve strength, balance and gait safety; She stopped in October since she was going to [place] for her cousin's wedding. She reports currently she is still walking without AD. She reports approximtely 2-3 falls each week. She  reports her left knee started hurting after one of her falls. She has not followed up with her PCP at this time regarding knee pain; She reports adherence with HEP when her mom is able to help, She also still has a home health aide (4 hours a week). The Aide is helping with HEP, assists with household chores, etc.    Limitations  Standing;Walking    How long can you sit comfortably?  NA    How long can you stand comfortably?  10 min    How long can you walk comfortably?  10-15 min, limited due to knee pain    Diagnostic tests  none recent    Patient Stated Goals  balance, improve safety and stability with walking.    Currently in Pain?  Yes    Pain Score  4     Pain Location  Leg    Pain Orientation  Right;Left;Anterior    Pain Descriptors / Indicators  Aching;Sore;Tightness    Pain Type  Acute pain    Pain Onset  1 to 4 weeks ago    Pain Frequency  Intermittent    Aggravating Factors   walking/weight bearing    Pain Relieving Factors  sitting/rest/stretch    Effect of Pain on Daily Activities  decreased activity tolerance;  Multiple Pain Sites  No              TREATMENT: Warm up on Treadmill 1.5 mph with 2 HHA x5 min with cues to increase erect posture, reduce trunk lean, improve step length and increase DF at heel strike; patient exhibit increased foot drag on RLE with increased hip ER and decreased hip/knee flexion during swing with mild hip circumduction; She also exhibits heavy trunk lean due to poor gluteal activation and decreased hip/core stabilization in mid stance. vitals after gait,HR 151bpm, SPo299%   Instructed patient in balance exercise:  Standing on airex: -feet apart, facing forward, arms to side, eyes open 30 sec hold, eyes closed 15 sec hold, CGA for safety;  -feet together, facing forward with arms across chest 30 sec hold x2 sets  -feet together, arms across chest,head turnsside/side, x10reps with mod VCs for erect posture and to improve  gluteal activation for less trendelenburg and better hip control/trunk control; -Feet apart: mini squat and reaching down to pick up cones on a 4 inch step, unsupported #3 and then placing them back on the step x2 sets; Feet apart, unsupported standing:   Side/side weight shift x10 reps;  Forward/backward weight shift x10 reps; Standing one foot on airex, one foot on 4 inch step unsupported:  Unsupported hold x15 sec x1 set with min A and cues for neutral weight shift; patient had significant difficulty with left foot on step, RLE on airex due to weakness in RLE gluteals;  Progressed to ball pass side/side x5 reps each foot on step;    Patient often compensates for loss of balance with upper trunk lean and hip ER to widen base of support. She required cues to keep feet oriented forward for less hip ER to challenge gluteal activation and improved stance control.   Resisted walking 7.5# forward/backward, side stepping (2 way)  walking x2 laps with min A for safety and 1 HHA with min VCs to increase weight shift and increase step length for better gait safety; Patient exhibits increased lateral trunk lean with unsteadiness with advanced gait training;   Exercise: Sit<>Stand with arms across chest (unsupported) x10 reps with good weight shift during transfer; She does have trouble slowing down sit down due to weakness with eccentric lower.  Response to treatment: Patient toleratedwell. She continues to compensate for imbalance with widened base of support/hip ER for better stance control. Patient does exhibit better tolerance with standing requiring fewer rest breaks. Min A was required with most balance exercise for trunk righting and stance control;  She does continue to have significant LE weakness particularly in hips which leads to compensation of stance position due to poor gluteal activation;                     PT Education - 12/29/19 1140    Education  provided  Yes    Education Details  LE strength, balance, HEP    Person(s) Educated  Patient    Methods  Explanation;Verbal cues    Comprehension  Verbalized understanding;Returned demonstration;Verbal cues required;Need further instruction       PT Short Term Goals - 12/13/19 1220      PT SHORT TERM GOAL #1   Title  Patient will be adherent to HEP at least 3x a week to improve functional strength and balance for better safety at home.    Baseline  has HEP, doing it 2-3 times per week dependening on help from Aide/mom; unable to go  to gym due to [place], 12/13/19: 2 days    Time  4    Period  Weeks    Status  Partially Met    Target Date  12/21/19      PT SHORT TERM GOAL #2   Title  Patient will exhibit improved safety awareness in the home reporting a maximum of 1 fall in last week.    Baseline  11/23/19: falls 3+ times a week, 12/13/19: no fall this past week    Time  4    Period  Weeks    Status  Achieved    Target Date  12/21/19      PT SHORT TERM GOAL #3   Title  Pt will improve BLE hip abductor strength to 3-/5 for improved gluteal activation to improve stance control    Baseline  11/23/19: 2/5, 12/13/19: 2/5    Time  4    Period  Weeks    Status  Not Met    Target Date  12/21/19        PT Long Term Goals - 12/13/19 1221      PT LONG TERM GOAL #1   Title  Patient will increase Berg Balance score by > 6 points to demonstrate decreased fall risk during functional activities.    Baseline  11/23/19: 24/56, 12/13/19: 26/56    Time  12    Period  Weeks    Status  Partially Met    Target Date  02/15/20      PT LONG TERM GOAL #2   Title  Patient will increase six minute walk test distance to >1000 feet without AD, supervision for progression to community ambulator and improve gait ability    Baseline  11/23/19: 650 feet with min A without AD, multiple stumbles, 12/13/19: 950 feet with min A, no AD, multiple stumbles    Time  12    Period  Weeks    Status  Partially Met    Target  Date  02/15/20      PT LONG TERM GOAL #3   Title  Pt will improve BLE hip abductor strength to 3+/5 for better functional strength in standing and to improve stability and balance.    Baseline  11/23/19: 2/5 bilaterally; 12/13/19: 2/5 bilaterally    Time  12    Period  Weeks    Status  Not Met    Target Date  02/15/20      PT LONG TERM GOAL #4   Title  Patient will be independent in bending down towards floor and picking up small object (<5 pounds) and then stand back up without loss of balance as to improve ability to pick up and clean up room at home    Baseline  11/23/19: able to bend down, but loses balance upon standing up; 12/13/19: no change    Time  12    Period  Weeks    Status  Not Met    Target Date  02/15/20      PT LONG TERM GOAL #5   Title  Patient will reduce timed up and go to <11 seconds to reduce fall risk and demonstrate improved transfer/gait ability.    Baseline  12/13/19: 16.5 sec without AD, min A    Time  12    Period  Weeks    Status  Not Met    Target Date  02/15/20            Plan - 12/29/19 1229  Clinical Impression Statement  Patient motivated and participated well within session. She was instructed in advanced balance exercise. She continues to have poor gluteal activation with compensation of stance posture. Required min A with most balance exercise for trunk righting and stance control. She would benefit from additional skilled PT intervention to improve strength, balance and mobility;    Personal Factors and Comorbidities  Comorbidity 2;Fitness;Past/Current Experience;Time since onset of injury/illness/exacerbation;Age    Comorbidities  CP and anxiety;    Examination-Activity Limitations  Caring for Others;Carry;Lift;Locomotion Level;Reach Overhead;Squat;Stairs;Stand;Transfers    Examination-Participation Restrictions  Church;Cleaning;Community Activity;Driving;Laundry;Medication Management;Meal Prep;Shop;Volunteer;Yard Work    Stability/Clinical  Decision Making  Unstable/Unpredictable    Rehab Potential  Good    PT Frequency  1x / week    PT Duration  12 weeks    PT Treatment/Interventions  Patient/family education;Manual techniques;Therapeutic activities;Therapeutic exercise;Electrical Stimulation;Aquatic Therapy;DME Instruction;Functional mobility training;Gait training;Neuromuscular re-education;ADLs/Self Care Home Management;Cryotherapy;Moist Heat;Stair training;Orthotic Fit/Training;Energy conservation    PT Next Visit Plan  assess/adjust HEP, walking program, strength and balance    PT Home Exercise Plan  had HEP, will adjust next session;    Consulted and Agree with Plan of Care  Patient;Family member/caregiver    Family Member Consulted  mom       Patient will benefit from skilled therapeutic intervention in order to improve the following deficits and impairments:  Abnormal gait, Decreased balance, Decreased endurance, Decreased mobility, Difficulty walking, Cardiopulmonary status limiting activity, Decreased activity tolerance, Decreased safety awareness, Decreased strength  Visit Diagnosis: Muscle weakness (generalized)  Unsteadiness on feet     Problem List Patient Active Problem List   Diagnosis Date Noted  . Loss of weight 07/17/2015  . Depression 07/17/2015  . Cerebral palsy ([place]) 07/17/2015    Bronsen Serano PT, DPT 12/29/2019, 12:31 PM  [place] MAIN [facility] SERVICES [address] [place], [place], [address] Phone: [phone]   Fax:  [phone]  Name: Renee Walton MRN: [id number] Date of Birth: 1996-03-04

## 2020-01-06 ENCOUNTER — Ambulatory Visit: Payer: Medicaid Other | Admitting: Physical Therapy

## 2020-01-13 ENCOUNTER — Ambulatory Visit: Payer: Medicaid Other | Admitting: Physical Therapy

## 2020-01-13 ENCOUNTER — Encounter: Payer: Self-pay | Admitting: Physical Therapy

## 2020-01-13 ENCOUNTER — Other Ambulatory Visit: Payer: Self-pay

## 2020-01-13 DIAGNOSIS — M6281 Muscle weakness (generalized): Secondary | ICD-10-CM

## 2020-01-13 DIAGNOSIS — R2681 Unsteadiness on feet: Secondary | ICD-10-CM

## 2020-01-13 NOTE — Therapy (Signed)
[place] MAIN [facility] SERVICES [address] [place], [place], [address] Phone: [phone]   Fax:  [phone]  Physical Therapy Treatment  Patient Details  Name: Renee Walton MRN: [id number] Date of Birth: 02-09-1996 Referring Provider (PT): [place], [place]   Encounter Date: 01/13/2020  PT End of Session - 01/13/20 1206    Visit Number  7    Number of Visits  13    Date for PT Re-Evaluation  02/15/20    Authorization Type  medicaid    Authorization Time Period  12 visits approved, 3/3-5/25    Authorization - Visit Number  3    Authorization - Number of Visits  12    PT Start Time  1202    PT Stop Time  1230    PT Time Calculation (min)  28 min    Equipment Utilized During Treatment  Gait belt    Activity Tolerance  Patient tolerated treatment well;No increased pain    Behavior During Therapy  WFL for tasks assessed/performed       Past Medical History:  Diagnosis Date  . Breakthrough bleeding   . Cerebral palsy ([place])   . GAD (generalized anxiety disorder)   . Migraine headache     Past Surgical History:  Procedure Laterality Date  . LEG SURGERY     bilateral x2 in 2008    There were no vitals filed for this visit.  Subjective Assessment - 01/13/20 1205    Subjective  Patient reports doing well; She reports going to the gym earlier in the week. 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 Patient had outpatient PT from [place] to improve strength, balance and gait safety; She stopped in October since she was going to [place] for her cousin's wedding. She reports currently she is still walking without AD. She reports approximtely 2-3 falls each week. She reports her left knee started hurting after one of her falls. She has not followed up with her PCP  at this time regarding knee pain; She reports adherence with HEP when her mom is able to help, She also still has a home health aide (4 hours a week). The Aide is helping with HEP, assists with household chores, etc.    Limitations  Standing;Walking    How long can you sit comfortably?  NA    How long can you stand comfortably?  10 min    How long can you walk comfortably?  10-15 min, limited due to knee pain    Diagnostic tests  none recent    Patient Stated Goals  balance, improve safety and stability with walking.    Currently in Pain?  Yes    Pain Score  6     Pain Location  Leg    Pain Orientation  Left    Pain Descriptors / Indicators  Aching;Sore    Pain Type  Acute pain    Pain Onset  1 to 4 weeks ago    Pain Frequency  Intermittent    Aggravating Factors   walking    Pain Relieving Factors  sitting/rest/stretch    Effect of Pain on Daily Activities  decreased activity tolerance;    Multiple Pain Sites  No  TREATMENT:     Instructed patient in balance exercise:   Standing on airex:  -feet together, facing forward unsupported:  Side/side weight shift x10 reps;  Forward/backward weight shift x10 reps;  Mini squat 3 sec hold unsupported x10 reps;   -feet together, arms across chest, head turns side/side,  x10 reps with mod VCs for erect posture and to improve gluteal activation for less trendelenburg and better hip control/trunk control; -Side step from airex to 4 inch step with 2 Rail assist x10 reps each with min A for safety;  Patient often compensates for loss of balance with upper trunk lean and hip ER to widen base of support. She required cues to keep feet oriented forward for less hip ER to challenge gluteal activation and improved stance control.       Resisted walking 7.5# forward/backward, side stepping (2 way)  walking x2 laps with min A for safety and 1 HHA with min VCs to increase weight shift and increase step length for better gait safety; Patient  exhibits increased lateral trunk lean with unsteadiness with advanced gait training;    Response to treatment: Patient tolerated well. She continues to compensate for imbalance with widened base of support/hip ER for better stance control. Patient does exhibit better tolerance with standing requiring fewer rest breaks. Min A was required with most balance exercise for trunk righting and stance control;  She does continue to have significant LE weakness particularly in hips which leads to compensation of stance position due to poor gluteal activation;                        PT Education - 01/13/20 1206    Education provided  Yes    Education Details  LE strength, balance, HEP    Person(s) Educated  Patient    Methods  Explanation;Verbal cues    Comprehension  Verbalized understanding;Returned demonstration;Verbal cues required;Need further instruction       PT Short Term Goals - 12/13/19 1220      PT SHORT TERM GOAL #1   Title  Patient will be adherent to HEP at least 3x a week to improve functional strength and balance for better safety at home.    Baseline  has HEP, doing it 2-3 times per week dependening on help from Aide/mom; unable to go to gym due to [place], 12/13/19: 2 days    Time  4    Period  Weeks    Status  Partially Met    Target Date  12/21/19      PT SHORT TERM GOAL #2   Title  Patient will exhibit improved safety awareness in the home reporting a maximum of 1 fall in last week.    Baseline  11/23/19: falls 3+ times a week, 12/13/19: no fall this past week    Time  4    Period  Weeks    Status  Achieved    Target Date  12/21/19      PT SHORT TERM GOAL #3   Title  Pt will improve BLE hip abductor strength to 3-/5 for improved gluteal activation to improve stance control    Baseline  11/23/19: 2/5, 12/13/19: 2/5    Time  4    Period  Weeks    Status  Not Met    Target Date  12/21/19        PT Long Term Goals - 12/13/19 1221      PT LONG TERM GOAL  #  1   Title  Patient will increase Berg Balance score by > 6 points to demonstrate decreased fall risk during functional activities.    Baseline  11/23/19: 24/56, 12/13/19: 26/56    Time  12    Period  Weeks    Status  Partially Met    Target Date  02/15/20      PT LONG TERM GOAL #2   Title  Patient will increase six minute walk test distance to >1000 feet without AD, supervision for progression to community ambulator and improve gait ability    Baseline  11/23/19: 650 feet with min A without AD, multiple stumbles, 12/13/19: 950 feet with min A, no AD, multiple stumbles    Time  12    Period  Weeks    Status  Partially Met    Target Date  02/15/20      PT LONG TERM GOAL #3   Title  Pt will improve BLE hip abductor strength to 3+/5 for better functional strength in standing and to improve stability and balance.    Baseline  11/23/19: 2/5 bilaterally; 12/13/19: 2/5 bilaterally    Time  12    Period  Weeks    Status  Not Met    Target Date  02/15/20      PT LONG TERM GOAL #4   Title  Patient will be independent in bending down towards floor and picking up small object (<5 pounds) and then stand back up without loss of balance as to improve ability to pick up and clean up room at home    Baseline  11/23/19: able to bend down, but loses balance upon standing up; 12/13/19: no change    Time  12    Period  Weeks    Status  Not Met    Target Date  02/15/20      PT LONG TERM GOAL #5   Title  Patient will reduce timed up and go to <11 seconds to reduce fall risk and demonstrate improved transfer/gait ability.    Baseline  12/13/19: 16.5 sec without AD, min A    Time  12    Period  Weeks    Status  Not Met    Target Date  02/15/20            Plan - 01/13/20 1254    Clinical Impression Statement  Patient late to session; She was motivated and participated well within session. Patient instructed in advanced balance exercise. She does require min A with most balance exercise with cues for neutral  weight shift. Patient did have increased difficulty with resisted walking requiring min A for safety; She would benefit from additional skilled PT intervention to improve strength, balance and mobility;    Personal Factors and Comorbidities  Comorbidity 2;Fitness;Past/Current Experience;Time since onset of injury/illness/exacerbation;Age    Comorbidities  CP and anxiety;    Examination-Activity Limitations  Caring for Others;Carry;Lift;Locomotion Level;Reach Overhead;Squat;Stairs;Stand;Transfers    Examination-Participation Restrictions  Church;Cleaning;Community Activity;Driving;Laundry;Medication Management;Meal Prep;Shop;Volunteer;Yard Work    Stability/Clinical Decision Making  Unstable/Unpredictable    Rehab Potential  Good    PT Frequency  1x / week    PT Duration  12 weeks    PT Treatment/Interventions  Patient/family education;Manual techniques;Therapeutic activities;Therapeutic exercise;Electrical Stimulation;Aquatic Therapy;DME Instruction;Functional mobility training;Gait training;Neuromuscular re-education;ADLs/Self Care Home Management;Cryotherapy;Moist Heat;Stair training;Orthotic Fit/Training;Energy conservation    PT Next Visit Plan  assess/adjust HEP, walking program, strength and balance    PT Home Exercise Plan  had HEP, will adjust next session;  Consulted and Agree with Plan of Care  Patient;Family member/caregiver    Family Member Consulted  mom       Patient will benefit from skilled therapeutic intervention in order to improve the following deficits and impairments:  Abnormal gait, Decreased balance, Decreased endurance, Decreased mobility, Difficulty walking, Cardiopulmonary status limiting activity, Decreased activity tolerance, Decreased safety awareness, Decreased strength  Visit Diagnosis: Muscle weakness (generalized)  Unsteadiness on feet     Problem List Patient Active Problem List   Diagnosis Date Noted  . Loss of weight 07/17/2015  . Depression  07/17/2015  . Cerebral palsy ([place]) 07/17/2015    Stephane Junkins PT, DPT 01/13/2020, 12:55 PM  [place] MAIN [facility] SERVICES [address] [place], [place], [address] Phone: [phone]   Fax:  [phone]  Name: Renee Walton MRN: [id number] Date of Birth: [birth year]

## 2020-01-20 ENCOUNTER — Ambulatory Visit: Payer: Medicaid Other | Attending: Physical Medicine and Rehabilitation | Admitting: Physical Therapy

## 2020-01-20 ENCOUNTER — Encounter: Payer: Self-pay | Admitting: Physical Therapy

## 2020-01-20 ENCOUNTER — Other Ambulatory Visit: Payer: Self-pay

## 2020-01-20 DIAGNOSIS — M6281 Muscle weakness (generalized): Secondary | ICD-10-CM | POA: Diagnosis present

## 2020-01-20 DIAGNOSIS — R2681 Unsteadiness on feet: Secondary | ICD-10-CM | POA: Insufficient documentation

## 2020-01-20 DIAGNOSIS — G809 Cerebral palsy, unspecified: Secondary | ICD-10-CM | POA: Diagnosis present

## 2020-01-20 NOTE — Therapy (Signed)
[place] MAIN [facility] SERVICES [address] [place], [place], [address] Phone: [phone]   Fax:  [phone]  Physical Therapy Treatment  Patient Details  Name: Renee Walton MRN: [id number] Date of Birth: [birth year] Referring Provider (PT): [place], [place]   Encounter Date: 01/20/2020  PT End of Session - 01/20/20 1152    Visit Number  8    Number of Visits  13    Date for PT Re-Evaluation  02/15/20    Authorization Type  medicaid    Authorization Time Period  12 visits approved, 3/3-5/25    Authorization - Visit Number  4    Authorization - Number of Visits  12    PT Start Time  2010    PT Stop Time  1230    PT Time Calculation (min)  44 min    Equipment Utilized During Treatment  Gait belt    Activity Tolerance  Patient tolerated treatment well;No increased pain    Behavior During Therapy  WFL for tasks assessed/performed       Past Medical History:  Diagnosis Date  . Breakthrough bleeding   . Cerebral palsy ([place])   . GAD (generalized anxiety disorder)   . Migraine headache     Past Surgical History:  Procedure Laterality Date  . LEG SURGERY     bilateral x2 in 2008    There were no vitals filed for this visit.  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 Patient had outpatient PT from [place] to improve strength, balance and gait safety; She stopped in October since she was going to [place] for her cousin's wedding. She reports currently she is still walking without AD. She reports approximtely 2-3 falls each week. She reports her left knee started hurting after one of her falls. She has not followed up  with her PCP at this time regarding knee pain; She reports adherence with HEP when her mom is able to help, She also still has a home health aide (4 hours a week). The Aide is helping with HEP, assists with household chores, etc.    Limitations  Standing;Walking    How long can you sit comfortably?  NA    How long can you stand comfortably?  10 min    How long can you walk comfortably?  10-15 min, limited due to knee pain    Diagnostic tests  none recent    Patient Stated Goals  balance, improve safety and stability with walking.    Currently in Pain?  Yes    Pain Score  5     Pain Location  Leg    Pain Orientation  Right;Left;Posterior    Pain Descriptors / Indicators  Aching;Sore    Pain Type  Acute pain    Pain Onset  1 to 4 weeks ago    Pain Frequency  Intermittent    Aggravating Factors   worse at night    Pain Relieving Factors  stretch/movement    Effect of Pain on Daily Activities  decreased sleeping tolerance;    Multiple Pain Sites  No  TREATMENT: Warm up on Treadmill 1.5 mph with 2 HHA x5 min with cues to increase erect posture, reduce trunk lean, improve step length and increase DF at heel strike; patient exhibit increased foot drag on RLE with increased hip ER and decreased hip/knee flexion during swing with mild hip circumduction; She also exhibits heavy trunk lean due to poor gluteal activation and decreased hip/core stabilization in mid stance.   vitals after gait, HR 138 bpm,     Instructed patient in balance exercise:    Weaving around cones #6 x2 laps with min A for safety and intermittent HHA with cues to increase step length for better gait safety; Patient does exhibit increased lateral trunk lean due to LE weakness and compensation for poor hip abductor activation;  Picking up cones #6 with CGA for safety without HHA to challenge dynamic balance.     Supine: Exercise:  PT performed passive SLR hamstring stretch 30 sec hold x2 reps bilaterally with  ankle DF to facilitate calf stretch; Educated patient in ways to stretch at home; Finished with rolling stick to BLE anterior/posterior leg with good tolerance x2 min;  Educated patient in how to obtain rolling stick for tissue extensibility; educated caregiver in importance of stretching after exercise to help reduce delayed onset muscle soreness. Patient/caregiver verbalized understanding;    Response to treatment: Patient tolerated well. She continues to compensate for imbalance with widened base of support/hip ER for better stance control. Patient does exhibit better tolerance with standing requiring fewer rest breaks. Min A was required with most balance exercise for trunk righting and stance control;  She does continue to have significant LE weakness particularly in hips which leads to compensation of stance position due to poor gluteal activation;                        PT Education - 01/20/20 1152    Education provided  Yes    Education Details  balance/ROM/HEP    Person(s) Educated  Patient    Methods  Explanation;Verbal cues    Comprehension  Verbalized understanding;Returned demonstration;Verbal cues required;Need further instruction       PT Short Term Goals - 12/13/19 1220      PT SHORT TERM GOAL #1   Title  Patient will be adherent to HEP at least 3x a week to improve functional strength and balance for better safety at home.    Baseline  has HEP, doing it 2-3 times per week dependening on help from Aide/mom; unable to go to gym due to [place], 12/13/19: 2 days    Time  4    Period  Weeks    Status  Partially Met    Target Date  12/21/19      PT SHORT TERM GOAL #2   Title  Patient will exhibit improved safety awareness in the home reporting a maximum of 1 fall in last week.    Baseline  11/23/19: falls 3+ times a week, 12/13/19: no fall this past week    Time  4    Period  Weeks    Status  Achieved    Target Date  12/21/19      PT SHORT TERM GOAL #3    Title  Pt will improve BLE hip abductor strength to 3-/5 for improved gluteal activation to improve stance control    Baseline  11/23/19: 2/5, 12/13/19: 2/5    Time  4    Period  Weeks  Status  Not Met    Target Date  12/21/19        PT Long Term Goals - 12/13/19 1221      PT LONG TERM GOAL #1   Title  Patient will increase Berg Balance score by > 6 points to demonstrate decreased fall risk during functional activities.    Baseline  11/23/19: 24/56, 12/13/19: 26/56    Time  12    Period  Weeks    Status  Partially Met    Target Date  02/15/20      PT LONG TERM GOAL #2   Title  Patient will increase six minute walk test distance to >1000 feet without AD, supervision for progression to community ambulator and improve gait ability    Baseline  11/23/19: 650 feet with min A without AD, multiple stumbles, 12/13/19: 950 feet with min A, no AD, multiple stumbles    Time  12    Period  Weeks    Status  Partially Met    Target Date  02/15/20      PT LONG TERM GOAL #3   Title  Pt will improve BLE hip abductor strength to 3+/5 for better functional strength in standing and to improve stability and balance.    Baseline  11/23/19: 2/5 bilaterally; 12/13/19: 2/5 bilaterally    Time  12    Period  Weeks    Status  Not Met    Target Date  02/15/20      PT LONG TERM GOAL #4   Title  Patient will be independent in bending down towards floor and picking up small object (<5 pounds) and then stand back up without loss of balance as to improve ability to pick up and clean up room at home    Baseline  11/23/19: able to bend down, but loses balance upon standing up; 12/13/19: no change    Time  12    Period  Weeks    Status  Not Met    Target Date  02/15/20      PT LONG TERM GOAL #5   Title  Patient will reduce timed up and go to <11 seconds to reduce fall risk and demonstrate improved transfer/gait ability.    Baseline  12/13/19: 16.5 sec without AD, min A    Time  12    Period  Weeks    Status  Not Met     Target Date  02/15/20            Plan - 01/20/20 1254    Clinical Impression Statement  Patient motivated and participated well within session. Instructed patient in advanced balance exercise.  She does have difficulty with dynamic movement requiring min A and intermittent HHA for safety. She reports adherence with HEP and has been going to gym for LE strengthening. She would benefit from additional skilled PT intervention to improve strength, balance and mobility;    Personal Factors and Comorbidities  Comorbidity 2;Fitness;Past/Current Experience;Time since onset of injury/illness/exacerbation;Age    Comorbidities  CP and anxiety;    Examination-Activity Limitations  Caring for Others;Carry;Lift;Locomotion Level;Reach Overhead;Squat;Stairs;Stand;Transfers    Examination-Participation Restrictions  Church;Cleaning;Community Activity;Driving;Laundry;Medication Management;Meal Prep;Shop;Volunteer;Yard Work    Stability/Clinical Decision Making  Unstable/Unpredictable    Rehab Potential  Good    PT Frequency  1x / week    PT Duration  12 weeks    PT Treatment/Interventions  Patient/family education;Manual techniques;Therapeutic activities;Therapeutic exercise;Electrical Stimulation;Aquatic Therapy;DME Instruction;Functional mobility training;Gait training;Neuromuscular re-education;ADLs/Self Care Home Management;Cryotherapy;Moist Heat;Stair training;Orthotic  Fit/Training;Energy conservation    PT Next Visit Plan  assess/adjust HEP, walking program, strength and balance    PT Home Exercise Plan  had HEP, will adjust next session;    Consulted and Agree with Plan of Care  Patient;Family member/caregiver    Family Member Consulted  mom       Patient will benefit from skilled therapeutic intervention in order to improve the following deficits and impairments:  Abnormal gait, Decreased balance, Decreased endurance, Decreased mobility, Difficulty walking, Cardiopulmonary status limiting  activity, Decreased activity tolerance, Decreased safety awareness, Decreased strength  Visit Diagnosis: Muscle weakness (generalized)  Unsteadiness on feet     Problem List Patient Active Problem List   Diagnosis Date Noted  . Loss of weight 07/17/2015  . Depression 07/17/2015  . Cerebral palsy ([place]) 07/17/2015    Payton Prinsen PT, DPT 01/20/2020, 12:56 PM  [place] MAIN [facility] SERVICES [address] [place], [place], [address] Phone: [phone]   Fax:  [phone]  Name: Renee Walton MRN: [id number] Date of Birth: June 04, 1996

## 2020-01-26 ENCOUNTER — Encounter: Payer: Self-pay | Admitting: Physical Therapy

## 2020-01-26 ENCOUNTER — Other Ambulatory Visit: Payer: Self-pay

## 2020-01-26 ENCOUNTER — Ambulatory Visit: Payer: Medicaid Other | Admitting: Physical Therapy

## 2020-01-26 DIAGNOSIS — R2681 Unsteadiness on feet: Secondary | ICD-10-CM

## 2020-01-26 DIAGNOSIS — M6281 Muscle weakness (generalized): Secondary | ICD-10-CM

## 2020-01-26 NOTE — Therapy (Signed)
[place] MAIN [facility] SERVICES [address] [place], [place], [address] Phone: [phone]   Fax:  [phone]  Physical Therapy Treatment  Patient Details  Name: Renee Walton MRN: [id number] Date of Birth: [birth year] Referring Provider (PT): [place], [place]   Encounter Date: 01/26/2020  PT End of Session - 01/26/20 1131    Visit Number  9    Number of Visits  13    Date for PT Re-Evaluation  02/15/20    Authorization Type  medicaid    Authorization Time Period  12 visits approved, 3/3-5/25    Authorization - Visit Number  5    Authorization - Number of Visits  12    PT Start Time  1132    PT Stop Time  1215    PT Time Calculation (min)  43 min    Equipment Utilized During Treatment  Gait belt    Activity Tolerance  Patient tolerated treatment well;No increased pain    Behavior During Therapy  WFL for tasks assessed/performed       Past Medical History:  Diagnosis Date  . Breakthrough bleeding   . Cerebral palsy ([place])   . GAD (generalized anxiety disorder)   . Migraine headache     Past Surgical History:  Procedure Laterality Date  . LEG SURGERY     bilateral x2 in 2008    There were no vitals filed for this visit.  Subjective Assessment - 01/26/20 1222    Subjective  Patient reports doing well; She reports getting rolling stick and reports significant reduction in BLE pain/tightness. 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 Patient had outpatient PT from [place] to improve strength, balance and gait safety; She stopped in October since she was going to [place] for her cousin's wedding. She reports currently she is still walking without AD. She reports approximtely 2-3 falls each week. She reports her left knee started hurting  after one of her falls. She has not followed up with her PCP at this time regarding knee pain; She reports adherence with HEP when her mom is able to help, She also still has a home health aide (4 hours a week). The Aide is helping with HEP, assists with household chores, etc.    Limitations  Standing;Walking    How long can you sit comfortably?  NA    How long can you stand comfortably?  10 min    How long can you walk comfortably?  10-15 min, limited due to knee pain    Diagnostic tests  none recent    Patient Stated Goals  balance, improve safety and stability with walking.    Currently in Pain?  No/denies    Pain Onset  1 to 4 weeks ago         TREATMENT: Instructed patient in dynamic balance/gait safety with walking outside on uneven surfaces: Up/down big ramp x2 sets with 1-0 HHA, min A for safety with mod VCs to improve upper trunk control, increase hip/ankle strategies for better weight shift and balance recovery. Patient had increased difficulty descending ramp compared to ascending ramp;  Walking on grass x50 feet x2 sets with 1-0 HHA, min-mod A for safety; Initially patient exhibits good base of support and good weight shift with better step  length; However during 2nd bout, she exhibits increased scissoring, narrow base of support and increased unsteadiness requiring min-mod A to avoid loss of balance; This is likely related to fatigue;  Weaving around cones #6 x2 laps on firm surface with min A for safety and intermittent HHA with cues to increase step length for better gait safety; Patient does exhibit increased lateral trunk lean due to LE weakness and compensation for poor hip abductor activation;  Picking up cones #6 with CGA for safety without HHA to challenge dynamic balance.    Patient tolerated session well. She reports not feeling increased fatigue but does exhibits increased unsteadiness and scissoring likely due to fatigue. Reinforced HEP; She denies any pain at end of  session;                    PT Education - 01/26/20 1130    Education provided  Yes    Education Details  balance/gait safety, HEP    Person(s) Educated  Patient    Methods  Explanation;Verbal cues    Comprehension  Verbalized understanding;Returned demonstration;Verbal cues required;Need further instruction       PT Short Term Goals - 12/13/19 1220      PT SHORT TERM GOAL #1   Title  Patient will be adherent to HEP at least 3x a week to improve functional strength and balance for better safety at home.    Baseline  has HEP, doing it 2-3 times per week dependening on help from Aide/mom; unable to go to gym due to [place], 12/13/19: 2 days    Time  4    Period  Weeks    Status  Partially Met    Target Date  12/21/19      PT SHORT TERM GOAL #2   Title  Patient will exhibit improved safety awareness in the home reporting a maximum of 1 fall in last week.    Baseline  11/23/19: falls 3+ times a week, 12/13/19: no fall this past week    Time  4    Period  Weeks    Status  Achieved    Target Date  12/21/19      PT SHORT TERM GOAL #3   Title  Pt will improve BLE hip abductor strength to 3-/5 for improved gluteal activation to improve stance control    Baseline  11/23/19: 2/5, 12/13/19: 2/5    Time  4    Period  Weeks    Status  Not Met    Target Date  12/21/19        PT Long Term Goals - 12/13/19 1221      PT LONG TERM GOAL #1   Title  Patient will increase Berg Balance score by > 6 points to demonstrate decreased fall risk during functional activities.    Baseline  11/23/19: 24/56, 12/13/19: 26/56    Time  12    Period  Weeks    Status  Partially Met    Target Date  02/15/20      PT LONG TERM GOAL #2   Title  Patient will increase six minute walk test distance to >1000 feet without AD, supervision for progression to community ambulator and improve gait ability    Baseline  11/23/19: 650 feet with min A without AD, multiple stumbles, 12/13/19: 950 feet with min A, no  AD, multiple stumbles    Time  12    Period  Weeks    Status  Partially Met  Target Date  02/15/20      PT LONG TERM GOAL #3   Title  Pt will improve BLE hip abductor strength to 3+/5 for better functional strength in standing and to improve stability and balance.    Baseline  11/23/19: 2/5 bilaterally; 12/13/19: 2/5 bilaterally    Time  12    Period  Weeks    Status  Not Met    Target Date  02/15/20      PT LONG TERM GOAL #4   Title  Patient will be independent in bending down towards floor and picking up small object (<5 pounds) and then stand back up without loss of balance as to improve ability to pick up and clean up room at home    Baseline  11/23/19: able to bend down, but loses balance upon standing up; 12/13/19: no change    Time  12    Period  Weeks    Status  Not Met    Target Date  02/15/20      PT LONG TERM GOAL #5   Title  Patient will reduce timed up and go to <11 seconds to reduce fall risk and demonstrate improved transfer/gait ability.    Baseline  12/13/19: 16.5 sec without AD, min A    Time  12    Period  Weeks    Status  Not Met    Target Date  02/15/20            Plan - 01/26/20 1223    Clinical Impression Statement  Patient motivated and participated well within session. Instructed patient in advanced gait tasks outside on uneven surfaces. She required intermittent HHA. Initially she was able to exhibit better base of support and less instability but with fatigue from prolonged walking she exhibits increased scissor gait and unsteadiness requiring increased assistance to avoid falling. Patien requires min VCs for weight shift and to improve upper trunk control for less instability. She would benefit from additional skilled PT intervention to improve strength, balance and mobility; Plan to address goals next session;    Personal Factors and Comorbidities  Comorbidity 2;Fitness;Past/Current Experience;Time since onset of injury/illness/exacerbation;Age     Comorbidities  CP and anxiety;    Examination-Activity Limitations  Caring for Others;Carry;Lift;Locomotion Level;Reach Overhead;Squat;Stairs;Stand;Transfers    Examination-Participation Restrictions  Church;Cleaning;Community Activity;Driving;Laundry;Medication Management;Meal Prep;Shop;Volunteer;Yard Work    Stability/Clinical Decision Making  Unstable/Unpredictable    Rehab Potential  Good    PT Frequency  1x / week    PT Duration  12 weeks    PT Treatment/Interventions  Patient/family education;Manual techniques;Therapeutic activities;Therapeutic exercise;Electrical Stimulation;Aquatic Therapy;DME Instruction;Functional mobility training;Gait training;Neuromuscular re-education;ADLs/Self Care Home Management;Cryotherapy;Moist Heat;Stair training;Orthotic Fit/Training;Energy conservation    PT Next Visit Plan  assess/adjust HEP, walking program, strength and balance    PT Home Exercise Plan  had HEP, will adjust next session;    Consulted and Agree with Plan of Care  Patient;Family member/caregiver    Family Member Consulted  mom       Patient will benefit from skilled therapeutic intervention in order to improve the following deficits and impairments:  Abnormal gait, Decreased balance, Decreased endurance, Decreased mobility, Difficulty walking, Cardiopulmonary status limiting activity, Decreased activity tolerance, Decreased safety awareness, Decreased strength  Visit Diagnosis: Muscle weakness (generalized)  Unsteadiness on feet     Problem List Patient Active Problem List   Diagnosis Date Noted  . Loss of weight 07/17/2015  . Depression 07/17/2015  . Cerebral palsy ([place]) 07/17/2015    Trotter,Margaret PT, DPT  01/26/2020, 12:25 PM  [place] MAIN [facility] SERVICES [address] [place], [place], [address] Phone: [phone]   Fax:  [phone]  Name: Renee Walton MRN: [id number] Date of Birth: [birth year]

## 2020-02-03 ENCOUNTER — Encounter: Payer: Self-pay | Admitting: Physical Therapy

## 2020-02-03 ENCOUNTER — Ambulatory Visit: Payer: Medicaid Other | Admitting: Physical Therapy

## 2020-02-03 ENCOUNTER — Other Ambulatory Visit: Payer: Self-pay

## 2020-02-03 DIAGNOSIS — M6281 Muscle weakness (generalized): Secondary | ICD-10-CM

## 2020-02-03 DIAGNOSIS — R2681 Unsteadiness on feet: Secondary | ICD-10-CM

## 2020-02-03 NOTE — Therapy (Signed)
[place] MAIN [facility] SERVICES [address] [place], [place], [address] Phone: [phone]   Fax:  [phone]  Physical Therapy Treatment Physical Therapy Progress Note   Dates of reporting period  12/13/19  to  02/03/20   Patient Details  Name: Renee Walton MRN: [id number] Date of Birth: 1995-11-28 Referring Provider (PT): [place], [place]   Encounter Date: 02/03/2020  PT End of Session - 02/03/20 1048    Visit Number  10    Number of Visits  25    Date for PT Re-Evaluation  04/27/20    Authorization Type  medicaid    Authorization Time Period  12 visits approved, 3/3-5/25    Authorization - Visit Number  6    Authorization - Number of Visits  12    PT Start Time  1050    PT Stop Time  1135    PT Time Calculation (min)  45 min    Equipment Utilized During Treatment  Gait belt    Activity Tolerance  Patient tolerated treatment well;No increased pain    Behavior During Therapy  WFL for tasks assessed/performed       Past Medical History:  Diagnosis Date  . Breakthrough bleeding   . Cerebral palsy ([place])   . GAD (generalized anxiety disorder)   . Migraine headache     Past Surgical History:  Procedure Laterality Date  . LEG SURGERY     bilateral x2 in 2008    There were no vitals filed for this visit.  Subjective Assessment - 02/03/20 1435    Subjective  Patient reports doing well; She reports falling 1x this last week. She report going to the gym 2x in last week. 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 Patient had outpatient PT from [place] to improve strength, balance and gait safety; She stopped in October since she was going to [place] for her cousin's wedding. She reports currently she is still walking  without AD. She reports approximtely 2-3 falls each week. She reports her left knee started hurting after one of her falls. She has not followed up with her PCP at this time regarding knee pain; She reports adherence with HEP when her mom is able to help, She also still has a home health aide (4 hours a week). The Aide is helping with HEP, assists with household chores, etc.    Limitations  Standing;Walking    How long can you sit comfortably?  NA    How long can you stand comfortably?  10 min    How long can you walk comfortably?  10-15 min, limited due to knee pain    Diagnostic tests  none recent    Patient Stated Goals  balance, improve safety and stability with walking.    Currently in Pain?  No/denies    Pain Onset  1 to 4 weeks ago    Multiple Pain Sites  No         [facility] PT Assessment - 02/03/20 0001      Observation/Other Assessments   Focus on Therapeutic Outcomes (FOTO)   49/100   more impaired from 12/13/19 which was 58/100   Activities of Balance Confidence Scale (ABC Scale)   29.6%  Strength   Right Hip Flexion  3-/5    Right Hip Extension  3-/5    Right Hip ABduction  2/5    Left Hip Flexion  4-/5    Left Hip Extension  2/5    Left Hip ABduction  2/5    Right Knee Flexion  3+/5    Right Knee Extension  4+/5    Left Knee Flexion  4+/5    Left Knee Extension  4+/5      6 Minute walk- Post Test   BP (mmHg)  (!) 154/98    HR (bpm)  144    02 Sat (%RA)  100 %      6 minute walk test results    Aerobic Endurance Distance Walked  1085    Endurance additional comments  with min A for balance/safety; improved from 12/13/19 which was 950 feet; community ambulator      [employer]   Sit to Stand  Able to stand without using hands and stabilize independently    Standing Unsupported  Able to stand 2 minutes with supervision    Sitting with Back Unsupported but Feet Supported on Floor or Stool  Able to sit safely and securely 2 minutes    Stand to Sit  Sits  safely with minimal use of hands    Transfers  Able to transfer safely, definite need of hands    Standing Unsupported with Eyes Closed  Able to stand 10 seconds with supervision    Standing Unsupported with Feet Together  Able to place feet together independently and stand for 1 minute with supervision    From Standing, Reach Forward with Outstretched Arm  Can reach forward >12 cm safely (5")    From Standing Position, Pick up Object from Floor  Able to pick up shoe, needs supervision    From Standing Position, Turn to Look Behind Over each Shoulder  Looks behind one side only/other side shows less weight shift    Turn 360 Degrees  Needs assistance while turning    Standing Unsupported, Alternately Place Feet on Step/Stool  Needs assistance to keep from falling or unable to try    Standing Unsupported, One Foot in Front  Needs help to step but can hold 15 seconds    Standing on One Leg  Unable to try or needs assist to prevent fall    Total Score  34    Berg comment:  significant improvement from 12/13/19 which was 26/56, high fall risk;      Timed Up and Go Test   Normal TUG (seconds)  17.26    TUG Comments  without AD, supervision for safety, slightly slower than 12/13/19 of 16.5 sec but no assistance required;         TREATMENT:    Instructed patient in outcome measures and assessed stretch to address goals, see above; Outcome measures 11/23/19 12/13/19 02/03/20  6 min walk 650 feet 950 feet 1085 feet  Berg Balance Assessment 24/56 26/56 34/56  Timed up and Go  16.5 sec- required min A for safety 17.2 sec, supervision, no assistance    Patient and caregiver educated on outcome progress.   Patient provided with LE stretches for HEP for home nurse to assist patient with, focusing on improving hip flexor, hamstring and hip adductor flexibility;  Patient tolerated well. She does exhibit significant improvement in most areas.                PT Education -  02/03/20 1048     Education provided  Yes    Education Details  balance/gait safety, HEP    Person(s) Educated  Patient    Methods  Explanation;Verbal cues    Comprehension  Verbalized understanding;Returned demonstration;Verbal cues required;Need further instruction       PT Short Term Goals - 02/03/20 1049      PT SHORT TERM GOAL #1   Title  Patient will be adherent to HEP at least 3x a week to improve functional strength and balance for better safety at home.    Baseline  has HEP, doing it 2-3 times per week dependening on help from Aide/mom; unable to go to gym due to [place], 12/13/19: 2 days 4/15: 2 days    Time  4    Period  Weeks    Status  Partially Met    Target Date  12/21/19      PT SHORT TERM GOAL #2   Title  Patient will exhibit improved safety awareness in the home reporting a maximum of 1 fall in last week.    Baseline  11/23/19: falls 3+ times a week, 12/13/19: no fall this past week, 4/15: 1 fall in last week    Time  4    Period  Weeks    Status  Achieved    Target Date  12/21/19      PT SHORT TERM GOAL #3   Title  Pt will improve BLE hip abductor strength to 3-/5 for improved gluteal activation to improve stance control    Baseline  11/23/19: 2/5, 12/13/19: 2/5, 4/15: 2/5    Time  4    Period  Weeks    Status  Not Met    Target Date  12/21/19        PT Long Term Goals - 02/03/20 1049      PT LONG TERM GOAL #1   Title  Patient will increase Berg Balance score by > 6 points to demonstrate decreased fall risk during functional activities.    Baseline  11/23/19: 24/56, 12/13/19: 26/56    Time  12    Period  Weeks    Status  Partially Met    Target Date  04/27/20      PT LONG TERM GOAL #2   Title  Patient will increase six minute walk test distance to >1000 feet without AD, supervision for progression to community ambulator and improve gait ability    Baseline  11/23/19: 650 feet with min A without AD, multiple stumbles, 12/13/19: 950 feet with min A, no AD, multiple stumbles, 4/15:  1085 feet, min A    Time  12    Period  Weeks    Status  Achieved    Target Date  04/27/20      PT LONG TERM GOAL #3   Title  Pt will improve BLE hip abductor strength to 3+/5 for better functional strength in standing and to improve stability and balance.    Baseline  11/23/19: 2/5 bilaterally; 12/13/19: 2/5 bilaterally, 4/15: 2/5    Time  12    Period  Weeks    Status  Not Met    Target Date  04/27/20      PT LONG TERM GOAL #4   Title  Patient will be independent in bending down towards floor and picking up small object (<5 pounds) and then stand back up without loss of balance as to improve ability to pick up and clean up room at home  Baseline  11/23/19: able to bend down, but loses balance upon standing up; 12/13/19: no change, 4/15: supervision    Time  12    Period  Weeks    Status  Partially Met    Target Date  04/27/20      PT LONG TERM GOAL #5   Title  Patient will reduce timed up and go to <11 seconds to reduce fall risk and demonstrate improved transfer/gait ability.    Baseline  12/13/19: 16.5 sec without AD, min A, 4/15:17.5 sec without AD, supervision    Time  12    Period  Weeks    Status  Partially Met    Target Date  04/27/20            Plan - 02/03/20 1436    Clinical Impression Statement  Patient motivated and participated well within session. She was instructed in outcome measures to address progress towards goals. She is still experiencing weakness in BLE but has made some progress, particularly in hip flexion and knee musculature. She exhibits a signifiant improvement in 6 min walk and berg balance assessment. She is still considered a high fall risk. Patient would benefit from additional skilled PT intervention to improve strength, balance and mobility; Provided written HEP for LE stretches for home nurse to assist with.    Personal Factors and Comorbidities  Comorbidity 2;Fitness;Past/Current Experience;Time since onset of injury/illness/exacerbation;Age     Comorbidities  CP and anxiety;    Examination-Activity Limitations  Caring for Others;Carry;Lift;Locomotion Level;Reach Overhead;Squat;Stairs;Stand;Transfers    Examination-Participation Restrictions  Church;Cleaning;Community Activity;Driving;Laundry;Medication Management;Meal Prep;Shop;Volunteer;Yard Work    Stability/Clinical Decision Making  Unstable/Unpredictable    Rehab Potential  Good    PT Frequency  1x / week    PT Duration  12 weeks    PT Treatment/Interventions  Patient/family education;Manual techniques;Therapeutic activities;Therapeutic exercise;Electrical Stimulation;Aquatic Therapy;DME Instruction;Functional mobility training;Gait training;Neuromuscular re-education;ADLs/Self Care Home Management;Cryotherapy;Moist Heat;Stair training;Orthotic Fit/Training;Energy conservation    PT Next Visit Plan  assess/adjust HEP, walking program, strength and balance    PT Home Exercise Plan  had HEP, will adjust next session;    Consulted and Agree with Plan of Care  Patient;Family member/caregiver    Family Member Consulted  mom       Patient will benefit from skilled therapeutic intervention in order to improve the following deficits and impairments:  Abnormal gait, Decreased balance, Decreased endurance, Decreased mobility, Difficulty walking, Cardiopulmonary status limiting activity, Decreased activity tolerance, Decreased safety awareness, Decreased strength  Visit Diagnosis: Muscle weakness (generalized)  Unsteadiness on feet     Problem List Patient Active Problem List   Diagnosis Date Noted  . Loss of weight 07/17/2015  . Depression 07/17/2015  . Cerebral palsy ([place]) 07/17/2015    Trotter,Margaret PT, DPT 02/03/2020, 2:45 PM  [place] MAIN [facility] SERVICES [address] [place], [place], [address] Phone: [phone]   Fax:  [phone]  Name: Renee Walton MRN: [id number] Date of Birth: 20-Dec-1995

## 2020-02-03 NOTE — Patient Instructions (Addendum)
Outcome measures 11/23/19 12/13/19 02/03/20  6 min walk 650 feet 950 feet 1085 feet  Berg Balance Assessment 24/56 26/56 34/56   Timed up and Go  16.5 sec- required min A for safety 17.2 sec, supervision, no assistance    Access Code: URL: [email or web address] Date: 02/03/2020 Prepared by: 02/05/2020  Exercises Supine Hamstring Stretch with Caregiver - 1 x daily - 7 x weekly - 2 sets - 3 reps - 30 sec hold Supine Hip Adductor Stretch with Caregiver - 1 x daily - 7 x weekly - 2 sets - 3 reps - 30 sec hold hold Supine Figure 4 Piriformis Stretch - 1 x daily - 7 x weekly - 2 sets - 3 reps - 30 sec hold Prone Quadriceps Stretch with Strap - 1 x daily - 7 x weekly - 2 sets - 3 reps - 30 sec hold

## 2020-02-09 ENCOUNTER — Encounter: Payer: Self-pay | Admitting: Physical Therapy

## 2020-02-09 ENCOUNTER — Other Ambulatory Visit: Payer: Self-pay

## 2020-02-09 ENCOUNTER — Ambulatory Visit: Payer: Medicaid Other

## 2020-02-09 DIAGNOSIS — R2681 Unsteadiness on feet: Secondary | ICD-10-CM

## 2020-02-09 DIAGNOSIS — M6281 Muscle weakness (generalized): Secondary | ICD-10-CM | POA: Diagnosis not present

## 2020-02-09 DIAGNOSIS — G809 Cerebral palsy, unspecified: Secondary | ICD-10-CM

## 2020-02-09 NOTE — Therapy (Signed)
[place] MAIN [facility] SERVICES [address] [place], [place], [address] Phone: [phone]   Fax:  [phone]  Physical Therapy Treatment  Patient Details  Name: Renee Walton MRN: [id number] Date of Birth: May 10, 1996 Referring Provider (PT): [place], [place]   Encounter Date: 02/09/2020  PT End of Session - 02/09/20 1247    Visit Number  11    Number of Visits  25    Date for PT Re-Evaluation  04/27/20    Authorization Type  medicaid    Authorization Time Period  12 visits approved, 3/3-5/25    Authorization - Visit Number  7    Authorization - Number of Visits  12    PT Start Time  1100    PT Stop Time  1140    PT Time Calculation (min)  40 min    Equipment Utilized During Treatment  Gait belt    Activity Tolerance  Patient tolerated treatment well;No increased pain    Behavior During Therapy  WFL for tasks assessed/performed       Past Medical History:  Diagnosis Date  . Breakthrough bleeding   . Cerebral palsy ([place])   . GAD (generalized anxiety disorder)   . Migraine headache     Past Surgical History:  Procedure Laterality Date  . LEG SURGERY     bilateral x2 in 2008    There were no vitals filed for this visit.  Subjective Assessment - 02/09/20 1246    Subjective  Patient stated she is doing well today, does mention L leg and R sided "tightness".    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 Patient had outpatient PT from [place] to improve strength, balance and gait safety; She stopped in October since she was going to [place] for her cousin's wedding. She reports currently she is still walking without AD. She reports approximtely 2-3 falls each week. She reports her left knee started hurting after one of her falls. She has not followed up with her PCP at this time regarding knee pain; She  reports adherence with HEP when her mom is able to help, She also still has a home health aide (4 hours a week). The Aide is helping with HEP, assists with household chores, etc.    Limitations  Standing;Walking    How long can you sit comfortably?  NA    How long can you stand comfortably?  10 min    How long can you walk comfortably?  10-15 min, limited due to knee pain    Diagnostic tests  none recent    Patient Stated Goals  balance, improve safety and stability with walking.    Currently in Pain?  No/denies         TREATMENT: Instructed patient in dynamic balance/gait safety with walking outside on uneven surfaces: Up/down big ramp x3 sets with 1-0 HHA, min A for safety with mod VCs to improve upper trunk control, increase hip/ankle strategies for better weight shift and balance recovery. Patient had increased difficulty descending ramp compared to ascending ramp, needed handheld assist majority of time during descent of ramp.   Walking on grass x50 feet x2 sets with 1-0 HHA, CGA--mod A for safety; Initially patient exhibits good base of support and good weight shift with better step length but with fatigue does require more assistance for instability and  decreased coordination of foot placement.    Weaving around cones #6 x2 laps on firm surface with min A for safety and intermittent HHA with cues to increase step length for better gait safety; Patient does exhibit increased lateral trunk lean due to LE weakness and compensation for poor hip abductor activation;  Picking up cones #6 with CGA for safety without HHA to challenge dynamic balance.   pt response/clinical impression: The patient denied pain, reported decreased tightness, and fatigue at end of session but remains highly motivated. Pt often speaks of her goal to be able to walk outside without assistance. Multimodal cueing throughout session to maximize gait and balance with fair carryover, but pt challenged with increased  fatigue.The patient would benefit from further skilled PT intervention to continue to progress towards goals.      PT Education - 02/09/20 1246    Education provided  Yes    Education Details  Education officer, museum) Educated  Patient    Methods  Explanation;Verbal cues    Comprehension  Verbalized understanding;Returned demonstration;Verbal cues required;Need further instruction       PT Short Term Goals - 02/03/20 1049      PT SHORT TERM GOAL #1   Title  Patient will be adherent to HEP at least 3x a week to improve functional strength and balance for better safety at home.    Baseline  has HEP, doing it 2-3 times per week dependening on help from Aide/mom; unable to go to gym due to [place], 12/13/19: 2 days 4/15: 2 days    Time  4    Period  Weeks    Status  Partially Met    Target Date  12/21/19      PT SHORT TERM GOAL #2   Title  Patient will exhibit improved safety awareness in the home reporting a maximum of 1 fall in last week.    Baseline  11/23/19: falls 3+ times a week, 12/13/19: no fall this past week, 4/15: 1 fall in last week    Time  4    Period  Weeks    Status  Achieved    Target Date  12/21/19      PT SHORT TERM GOAL #3   Title  Pt will improve BLE hip abductor strength to 3-/5 for improved gluteal activation to improve stance control    Baseline  11/23/19: 2/5, 12/13/19: 2/5, 4/15: 2/5    Time  4    Period  Weeks    Status  Not Met    Target Date  12/21/19        PT Long Term Goals - 02/03/20 1049      PT LONG TERM GOAL #1   Title  Patient will increase Berg Balance score by > 6 points to demonstrate decreased fall risk during functional activities.    Baseline  11/23/19: 24/56, 12/13/19: 26/56    Time  12    Period  Weeks    Status  Partially Met    Target Date  04/27/20      PT LONG TERM GOAL #2   Title  Patient will increase six minute walk test distance to >1000 feet without AD, supervision for progression to community ambulator and improve  gait ability    Baseline  11/23/19: 650 feet with min A without AD, multiple stumbles, 12/13/19: 950 feet with min A, no AD, multiple stumbles, 4/15: 1085 feet, min A    Time  12  Period  Weeks    Status  Achieved    Target Date  04/27/20      PT LONG TERM GOAL #3   Title  Pt will improve BLE hip abductor strength to 3+/5 for better functional strength in standing and to improve stability and balance.    Baseline  11/23/19: 2/5 bilaterally; 12/13/19: 2/5 bilaterally, 4/15: 2/5    Time  12    Period  Weeks    Status  Not Met    Target Date  04/27/20      PT LONG TERM GOAL #4   Title  Patient will be independent in bending down towards floor and picking up small object (<5 pounds) and then stand back up without loss of balance as to improve ability to pick up and clean up room at home    Baseline  11/23/19: able to bend down, but loses balance upon standing up; 12/13/19: no change, 4/15: supervision    Time  12    Period  Weeks    Status  Partially Met    Target Date  04/27/20      PT LONG TERM GOAL #5   Title  Patient will reduce timed up and go to <11 seconds to reduce fall risk and demonstrate improved transfer/gait ability.    Baseline  12/13/19: 16.5 sec without AD, min A, 4/15:17.5 sec without AD, supervision    Time  12    Period  Weeks    Status  Partially Met    Target Date  04/27/20            Plan - 02/09/20 1247    Clinical Impression Statement  The patient denied pain, reported decreased tightness, and fatigue at end of session but remains highly motivated. Pt often speaks of her goal to be able to walk outside without assistance. Multimodal cueing throughout session to maximize gait and balance with fair carryover, but pt challenged with increased fatigue.The patient would benefit from further skilled PT intervention to continue to progress towards goals.    Personal Factors and Comorbidities  Comorbidity 2;Fitness;Past/Current Experience;Time since onset of  injury/illness/exacerbation;Age    Comorbidities  CP and anxiety;    Examination-Activity Limitations  Caring for Others;Carry;Lift;Locomotion Level;Reach Overhead;Squat;Stairs;Stand;Transfers    Examination-Participation Restrictions  Church;Cleaning;Community Activity;Driving;Laundry;Medication Management;Meal Prep;Shop;Volunteer;Yard Work    Stability/Clinical Decision Making  Unstable/Unpredictable    Rehab Potential  Good    PT Frequency  1x / week    PT Duration  12 weeks    PT Treatment/Interventions  Patient/family education;Manual techniques;Therapeutic activities;Therapeutic exercise;Electrical Stimulation;Aquatic Therapy;DME Instruction;Functional mobility training;Gait training;Neuromuscular re-education;ADLs/Self Care Home Management;Cryotherapy;Moist Heat;Stair training;Orthotic Fit/Training;Energy conservation    PT Next Visit Plan  assess/adjust HEP, walking program, strength and balance    PT Home Exercise Plan  had HEP, will adjust next session;    Consulted and Agree with Plan of Care  Patient       Patient will benefit from skilled therapeutic intervention in order to improve the following deficits and impairments:  Abnormal gait, Decreased balance, Decreased endurance, Decreased mobility, Difficulty walking, Cardiopulmonary status limiting activity, Decreased activity tolerance, Decreased safety awareness, Decreased strength  Visit Diagnosis: Muscle weakness (generalized)  Unsteadiness on feet  Cerebral palsy, unspecified type [facility])     Problem List Patient Active Problem List   Diagnosis Date Noted  . Loss of weight 07/17/2015  . Depression 07/17/2015  . Cerebral palsy ([place]) 07/17/2015    Lieutenant Diego PT, DPT 12:51 PM,02/09/20   [place]  [place] MAIN [facility] SERVICES [place], [place], [address] Phone: [phone]   Fax:  [phone]  Name: Tiasha Helvie MRN: [id number] Date of Birth: [birth year]

## 2020-02-11 ENCOUNTER — Ambulatory Visit: Payer: Medicaid Other | Attending: Internal Medicine

## 2020-02-16 ENCOUNTER — Ambulatory Visit: Payer: Medicaid Other | Admitting: Physical Therapy

## 2020-02-17 ENCOUNTER — Ambulatory Visit: Payer: Medicaid Other | Attending: Internal Medicine

## 2020-02-17 DIAGNOSIS — Z23 Encounter for immunization: Secondary | ICD-10-CM

## 2020-02-17 NOTE — Progress Notes (Signed)
   Covid-19 Vaccination Clinic  Name:  Renee Walton    MRN: [id number] DOB: [birth year]  02/17/2020  [name] was observed post Covid-19 immunization for 15 minutes without incident. She was provided with Vaccine Information Sheet and instruction to access the V-Safe system.   [name] was instructed to call 911 with any severe reactions post vaccine: Marland Kitchen Difficulty breathing  . Swelling of face and throat  . A fast heartbeat  . A bad rash all over body  . Dizziness and weakness   Immunizations Administered    Name Date Dose VIS Date Route   Pfizer COVID-19 Vaccine 02/17/2020 11:27 AM 0.3 mL 12/15/2018 Intramuscular   Manufacturer: [employer]   Lot: [id number]   NDC: [address]-8

## 2020-02-23 ENCOUNTER — Other Ambulatory Visit: Payer: Self-pay

## 2020-02-23 ENCOUNTER — Ambulatory Visit: Payer: Medicaid Other | Attending: Physical Medicine and Rehabilitation

## 2020-02-23 DIAGNOSIS — G809 Cerebral palsy, unspecified: Secondary | ICD-10-CM

## 2020-02-23 DIAGNOSIS — M6281 Muscle weakness (generalized): Secondary | ICD-10-CM | POA: Diagnosis present

## 2020-02-23 DIAGNOSIS — R2681 Unsteadiness on feet: Secondary | ICD-10-CM

## 2020-02-23 NOTE — Therapy (Signed)
[place] MAIN [facility] SERVICES [address] [place], [place], [address] Phone: [phone]   Fax:  [phone]  Physical Therapy Treatment  Patient Details  Name: Bobbiejo Ishikawa MRN: [id number] Date of Birth: [birth year] Referring Provider (PT): [place], [place]   Encounter Date: 02/23/2020  PT End of Session - 02/23/20 1245    Visit Number  12    Number of Visits  25    Date for PT Re-Evaluation  04/27/20    Authorization Type  medicaid    Authorization Time Period  12 visits approved, 3/3-5/25    Authorization - Visit Number  8    Authorization - Number of Visits  12    PT Start Time  9371    PT Stop Time  1130    PT Time Calculation (min)  45 min    Equipment Utilized During Treatment  Gait belt    Activity Tolerance  Patient tolerated treatment well;No increased pain    Behavior During Therapy  WFL for tasks assessed/performed       Past Medical History:  Diagnosis Date  . Breakthrough bleeding   . Cerebral palsy ([place])   . GAD (generalized anxiety disorder)   . Migraine headache     Past Surgical History:  Procedure Laterality Date  . LEG SURGERY     bilateral x2 in 2008    There were no vitals filed for this visit.  Subjective Assessment - 02/23/20 1105    Subjective  Pt reports she is doing ok today. She got her first Covid Vaccine dose on Thursday 4/29 and had a lot of additional fatigue adn tightness thereafter, has fallen 4 time since, more difficulty moving her Left leg to take steps. Pt is having more tightness in ankle and left hip which is causing more pain.    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 Patient had outpatient PT from [facility] to improve strength, balance and gait safety; She stopped in October since she was going to [place] for her cousin's wedding. She reports currently she is still walking without AD. She reports  approximtely 2-3 falls each week. She reports her left knee started hurting after one of her falls. She has not followed up with her PCP at this time regarding knee pain; She reports adherence with HEP when her mom is able to help, She also still has a home health aide (4 hours a week). The Aide is helping with HEP, assists with household chores, etc.    Pain Score  4     Pain Location  --   Left calf and posterior hip tightness   Pain Descriptors / Indicators  Tightness        INTERVENTION THIS DATE: Static Stretching to address spasticity 3x30sec of the following in supine -Left hamstrings stretch  -Left pectineus stretch -Left adductor magnus stretch  *no detectable tightness in calf/hip external rots  Instructed patient in dynamic balance/gait safety with walking outside on uneven surfaces: Up/down big ramp x4 sets with 1-0 HHA, min A for safety with mod VCs to improve upper trunk control, increase hip/ankle strategies for better weight shift and balance recovery.    Walking on grass x50 feet x2 sets with 1-0 HHA, min-mod A for safety; Several LOB requiring maxA for recovery during last bout.       PT Short Term Goals - 02/03/20 1049  PT SHORT TERM GOAL #1   Title  Patient will be adherent to HEP at least 3x a week to improve functional strength and balance for better safety at home.    Baseline  has HEP, doing it 2-3 times per week dependening on help from Aide/mom; unable to go to gym due to [place], 12/13/19: 2 days 4/15: 2 days    Time  4    Period  Weeks    Status  Partially Met    Target Date  12/21/19      PT SHORT TERM GOAL #2   Title  Patient will exhibit improved safety awareness in the home reporting a maximum of 1 fall in last week.    Baseline  11/23/19: falls 3+ times a week, 12/13/19: no fall this past week, 4/15: 1 fall in last week    Time  4    Period  Weeks    Status  Achieved    Target Date  12/21/19      PT SHORT TERM GOAL #3   Title  Pt will improve  BLE hip abductor strength to 3-/5 for improved gluteal activation to improve stance control    Baseline  11/23/19: 2/5, 12/13/19: 2/5, 4/15: 2/5    Time  4    Period  Weeks    Status  Not Met    Target Date  12/21/19        PT Long Term Goals - 02/03/20 1049      PT LONG TERM GOAL #1   Title  Patient will increase Berg Balance score by > 6 points to demonstrate decreased fall risk during functional activities.    Baseline  11/23/19: 24/56, 12/13/19: 26/56    Time  12    Period  Weeks    Status  Partially Met    Target Date  04/27/20      PT LONG TERM GOAL #2   Title  Patient will increase six minute walk test distance to >1000 feet without AD, supervision for progression to community ambulator and improve gait ability    Baseline  11/23/19: 650 feet with min A without AD, multiple stumbles, 12/13/19: 950 feet with min A, no AD, multiple stumbles, 4/15: 1085 feet, min A    Time  12    Period  Weeks    Status  Achieved    Target Date  04/27/20      PT LONG TERM GOAL #3   Title  Pt will improve BLE hip abductor strength to 3+/5 for better functional strength in standing and to improve stability and balance.    Baseline  11/23/19: 2/5 bilaterally; 12/13/19: 2/5 bilaterally, 4/15: 2/5    Time  12    Period  Weeks    Status  Not Met    Target Date  04/27/20      PT LONG TERM GOAL #4   Title  Patient will be independent in bending down towards floor and picking up small object (<5 pounds) and then stand back up without loss of balance as to improve ability to pick up and clean up room at home    Baseline  11/23/19: able to bend down, but loses balance upon standing up; 12/13/19: no change, 4/15: supervision    Time  12    Period  Weeks    Status  Partially Met    Target Date  04/27/20      PT LONG TERM GOAL #5   Title  Patient will  reduce timed up and go to <11 seconds to reduce fall risk and demonstrate improved transfer/gait ability.    Baseline  12/13/19: 16.5 sec without AD, min A,  4/15:17.5 sec without AD, supervision    Time  12    Period  Weeks    Status  Partially Met    Target Date  04/27/20            Plan - 02/23/20 1246    Clinical Impression Statement  Continued with current POC. Pt required additional stretching this date due to recent increased subjective tightness since vaccine. Pt able ot progress ramp AMB to 4x without rest. Pt finished with AMB In grass, ince with RUE HHA, then attempted without UE supoprt, but ultimately fatigue limited performance. Pt conitnues to demonstrate excellent progress toward goals.    Personal Factors and Comorbidities  Comorbidity 2;Fitness;Past/Current Experience;Time since onset of injury/illness/exacerbation;Age    Comorbidities  CP and anxiety;    Examination-Activity Limitations  Caring for Others;Carry;Lift;Locomotion Level;Reach Overhead;Squat;Stairs;Stand;Transfers    Examination-Participation Restrictions  Church;Cleaning;Community Activity;Driving;Laundry;Medication Management;Meal Prep;Shop;Volunteer;Yard Work    Stability/Clinical Decision Making  Unstable/Unpredictable    International aid/development worker    Rehab Potential  Good    PT Frequency  1x / week    PT Duration  12 weeks    PT Treatment/Interventions  Patient/family education;Manual techniques;Therapeutic activities;Therapeutic exercise;Electrical Stimulation;Aquatic Therapy;DME Instruction;Functional mobility training;Gait training;Neuromuscular re-education;ADLs/Self Care Home Management;Cryotherapy;Moist Heat;Stair training;Orthotic Fit/Training;Energy conservation    PT Next Visit Plan  assess/adjust HEP, walking program, strength and balance    PT Home Exercise Plan  had HEP, will adjust next session;    Consulted and Agree with Plan of Care  Patient       Patient will benefit from skilled therapeutic intervention in order to improve the following deficits and impairments:  Abnormal gait, Decreased balance, Decreased endurance, Decreased  mobility, Difficulty walking, Cardiopulmonary status limiting activity, Decreased activity tolerance, Decreased safety awareness, Decreased strength  Visit Diagnosis: Muscle weakness (generalized)  Unsteadiness on feet  Cerebral palsy, unspecified type [facility])     Problem List Patient Active Problem List   Diagnosis Date Noted  . Loss of weight 07/17/2015  . Depression 07/17/2015  . Cerebral palsy ([place]) 07/17/2015   12:52 PM, 02/23/20 Etta Grandchild, PT, DPT Physical Therapist - [facility]  Outpatient Physical Therapy- [place] [phone]     Etta Grandchild 02/23/2020, 12:49 PM  [place] MAIN [facility] SERVICES [address] [place], [place], [address] Phone: [phone]   Fax:  [phone]  Name: Lilla Callejo MRN: [id number] Date of Birth: 26-Dec-1995

## 2020-03-01 ENCOUNTER — Ambulatory Visit: Payer: Medicaid Other | Admitting: Physical Therapy

## 2020-03-01 ENCOUNTER — Other Ambulatory Visit: Payer: Self-pay

## 2020-03-01 ENCOUNTER — Encounter: Payer: Self-pay | Admitting: Physical Therapy

## 2020-03-01 DIAGNOSIS — R2681 Unsteadiness on feet: Secondary | ICD-10-CM

## 2020-03-01 DIAGNOSIS — M6281 Muscle weakness (generalized): Secondary | ICD-10-CM | POA: Diagnosis not present

## 2020-03-01 NOTE — Therapy (Signed)
[place] MAIN [facility] SERVICES [address] [place], [place], [address] Phone: [phone]   Fax:  [phone]  Physical Therapy Treatment  Patient Details  Name: Renee Walton MRN: [id number] Date of Birth: Jun 20, 1996 Referring Provider (PT): [place], [place]   Encounter Date: 03/01/2020  PT End of Session - 03/01/20 1042    Visit Number  13    Number of Visits  25    Date for PT Re-Evaluation  04/27/20    Authorization Type  medicaid    Authorization Time Period  12 visits approved, 3/3-5/25    Authorization - Visit Number  9    Authorization - Number of Visits  12    PT Start Time  4656    PT Stop Time  1130    PT Time Calculation (min)  45 min    Equipment Utilized During Treatment  Gait belt    Activity Tolerance  Patient tolerated treatment well;No increased pain    Behavior During Therapy  WFL for tasks assessed/performed       Past Medical History:  Diagnosis Date  . Breakthrough bleeding   . Cerebral palsy ([place])   . GAD (generalized anxiety disorder)   . Migraine headache     Past Surgical History:  Procedure Laterality Date  . LEG SURGERY     bilateral x2 in 2008    There were no vitals filed for this visit.  Subjective Assessment - 03/01/20 1044    Subjective  patient reports continued stiffness and fatigue after Covid vaccine last week. 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 Patient had outpatient PT from [facility] to improve strength, balance and gait safety; She stopped in October since she was going to [place] for her cousin's wedding. She reports currently she is still walking without AD. She reports approximtely 2-3 falls each week. She reports her left knee started hurting after one of her falls. She has not followed up with her PCP at this time regarding  knee pain; She reports adherence with HEP when her mom is able to help, She also still has a home health aide (4 hours a week). The Aide is helping with HEP, assists with household chores, etc.    Currently in Pain?  No/denies    Multiple Pain Sites  No           TREATMENT: Supine: Exercise:  PT performed passive SLR hamstring stretch 30 sec hold x2 reps bilaterally with ankle DF to facilitate calf stretch;   Patient prone: Quad stretch 30 sec hold x2 reps bilaterally; Patient does exhibit moderate tightness in BLE with increased adductor tone and stiffness in each LE;  Instructed patient in balance exercise: Standing in parallel bars: On airex pad:  Feet apart   Side/side weight shifts x10 reps;    Arms across chest trunk rotation x10 reps each direction  Standing one foot on airex, one foot on 4 inch step    BUE wand flexion x10 reps each foot on step  Mini squat x15 reps with min A for safety  Feet apart, balloon taps x2-3 min with min A for safety;  Patient required min A for most balance exercise, and cues for weight shift for better stance control; Pt exhibits increased posterior lean requiring cues to shift forward for less loss of balance;  Weaving around cones #6 x2 laps with min A for safety and intermittent HHA with cues to increase step length for better gait safety; Patient does exhibit increased lateral trunk lean due to LE weakness and compensation for poor hip abductor activation;   Resisted walking 7.5# forward/backward x2 laps, side stepping x2 laps with min A for safety and intermittent HHA. Patient had increased difficulty with eccentric return; She also required min VCs for proper foot position with side stepping to avoid hip ER for better hip abductor activation;   Response to treatment: Patient toleratedwell. She continues to compensate for imbalance with widened base of support/hip ER for better stance control.  Min A was required with most balance  exercise for trunk righting and stance control;She does continue to have significant LE weakness particularly in hips which leads to compensation of stance position due to poor gluteal activation;                       PT Education - 03/01/20 1042    Education provided  Yes    Education Details  LE strength, balance/gait safety, HEP    Person(s) Educated  Patient    Methods  Explanation;Verbal cues    Comprehension  Verbalized understanding;Returned demonstration;Verbal cues required;Need further instruction       PT Short Term Goals - 02/03/20 1049      PT SHORT TERM GOAL #1   Title  Patient will be adherent to HEP at least 3x a week to improve functional strength and balance for better safety at home.    Baseline  has HEP, doing it 2-3 times per week dependening on help from Aide/mom; unable to go to gym due to [place], 12/13/19: 2 days 4/15: 2 days    Time  4    Period  Weeks    Status  Partially Met    Target Date  12/21/19      PT SHORT TERM GOAL #2   Title  Patient will exhibit improved safety awareness in the home reporting a maximum of 1 fall in last week.    Baseline  11/23/19: falls 3+ times a week, 12/13/19: no fall this past week, 4/15: 1 fall in last week    Time  4    Period  Weeks    Status  Achieved    Target Date  12/21/19      PT SHORT TERM GOAL #3   Title  Pt will improve BLE hip abductor strength to 3-/5 for improved gluteal activation to improve stance control    Baseline  11/23/19: 2/5, 12/13/19: 2/5, 4/15: 2/5    Time  4    Period  Weeks    Status  Not Met    Target Date  12/21/19        PT Long Term Goals - 02/03/20 1049      PT LONG TERM GOAL #1   Title  Patient will increase Berg Balance score by > 6 points to demonstrate decreased fall risk during functional activities.    Baseline  11/23/19: 24/56, 12/13/19: 26/56    Time  12    Period  Weeks    Status  Partially Met    Target Date  04/27/20      PT LONG TERM GOAL #2   Title   Patient will increase six minute walk test distance to >1000 feet without AD, supervision for progression to community ambulator and improve gait ability    Baseline  11/23/19: 650 feet with min A without AD, multiple stumbles, 12/13/19: 950 feet with min A, no AD, multiple stumbles, 4/15: 1085 feet, min A    Time  12    Period  Weeks    Status  Achieved    Target Date  04/27/20      PT LONG TERM GOAL #3   Title  Pt will improve BLE hip abductor strength to 3+/5 for better functional strength in standing and to improve stability and balance.    Baseline  11/23/19: 2/5 bilaterally; 12/13/19: 2/5 bilaterally, 4/15: 2/5    Time  12    Period  Weeks    Status  Not Met    Target Date  04/27/20      PT LONG TERM GOAL #4   Title  Patient will be independent in bending down towards floor and picking up small object (<5 pounds) and then stand back up without loss of balance as to improve ability to pick up and clean up room at home    Baseline  11/23/19: able to bend down, but loses balance upon standing up; 12/13/19: no change, 4/15: supervision    Time  12    Period  Weeks    Status  Partially Met    Target Date  04/27/20      PT LONG TERM GOAL #5   Title  Patient will reduce timed up and go to <11 seconds to reduce fall risk and demonstrate improved transfer/gait ability.    Baseline  12/13/19: 16.5 sec without AD, min A, 4/15:17.5 sec without AD, supervision    Time  12    Period  Weeks    Status  Partially Met    Target Date  04/27/20            Plan - 03/01/20 1159    Clinical Impression Statement  Patient motivated and participated well within session. Instructed patient in advanced balance exercise. She does exhibit increased posterior loss of balance with prolonged standing especially with reduced UE assist. Patient requires min A with advanced balance tasks with poor trunk control and decreased reaction strategies. patient instructed to work on stance control at home as part of HEP. She  would benefit from additional skilled PT intervention to improve strength, balance and mobility;    Personal Factors and Comorbidities  Comorbidity 2;Fitness;Past/Current Experience;Time since onset of injury/illness/exacerbation;Age    Comorbidities  CP and anxiety;    Examination-Activity Limitations  Caring for Others;Carry;Lift;Locomotion Level;Reach Overhead;Squat;Stairs;Stand;Transfers    Examination-Participation Restrictions  Church;Cleaning;Community Activity;Driving;Laundry;Medication Management;Meal Prep;Shop;Volunteer;Yard Work    Stability/Clinical Decision Making  Unstable/Unpredictable    Rehab Potential  Good    PT Frequency  1x / week    PT Duration  12 weeks    PT Treatment/Interventions  Patient/family education;Manual techniques;Therapeutic activities;Therapeutic exercise;Electrical Stimulation;Aquatic Therapy;DME Instruction;Functional mobility training;Gait training;Neuromuscular re-education;ADLs/Self Care Home Management;Cryotherapy;Moist Heat;Stair training;Orthotic Fit/Training;Energy conservation    PT Next Visit Plan  assess/adjust HEP, walking program, strength and balance    PT Home Exercise Plan  had HEP, will adjust next session;    Consulted and Agree with Plan of Care  Patient       Patient will benefit from skilled therapeutic intervention in order to improve the following deficits and impairments:  Abnormal gait, Decreased balance, Decreased endurance, Decreased mobility, Difficulty walking, Cardiopulmonary status limiting activity, Decreased activity tolerance, Decreased safety awareness, Decreased strength  Visit Diagnosis: Muscle weakness (generalized)  Unsteadiness on feet     Problem List Patient Active  Problem List   Diagnosis Date Noted  . Loss of weight 07/17/2015  . Depression 07/17/2015  . Cerebral palsy ([place]) 07/17/2015    Renee Walton PT, DPT 03/01/2020, 12:02 PM  [place] MAIN [facility]  SERVICES [address] [place], [place], [address] Phone: [phone]   Fax:  [phone]  Name: Renee Walton MRN: [id number] Date of Birth: January 07, 1996

## 2020-03-08 ENCOUNTER — Encounter: Payer: Self-pay | Admitting: Physical Therapy

## 2020-03-08 ENCOUNTER — Other Ambulatory Visit: Payer: Self-pay

## 2020-03-08 ENCOUNTER — Ambulatory Visit: Payer: Medicaid Other | Admitting: Physical Therapy

## 2020-03-08 DIAGNOSIS — M6281 Muscle weakness (generalized): Secondary | ICD-10-CM

## 2020-03-08 DIAGNOSIS — R2681 Unsteadiness on feet: Secondary | ICD-10-CM

## 2020-03-08 NOTE — Therapy (Signed)
[place] MAIN [facility] SERVICES [address] [place], [place], [address] Phone: [phone]   Fax:  [phone]  Physical Therapy Treatment  Patient Details  Name: Renee Walton MRN: [id number] Date of Birth: 31-Aug-1996 Referring Provider (PT): [place], [place]   Encounter Date: 03/08/2020  PT End of Session - 03/08/20 1103    Visit Number  14    Number of Visits  25    Date for PT Re-Evaluation  04/27/20    Authorization Type  medicaid    Authorization Time Period  12 visits approved, 3/3-5/25    Authorization - Visit Number  10    Authorization - Number of Visits  12    PT Start Time  6948    PT Stop Time  5462    PT Time Calculation (min)  43 min    Equipment Utilized During Treatment  Gait belt    Activity Tolerance  Patient tolerated treatment well;No increased pain    Behavior During Therapy  WFL for tasks assessed/performed       Past Medical History:  Diagnosis Date  . Breakthrough bleeding   . Cerebral palsy ([place])   . GAD (generalized anxiety disorder)   . Migraine headache     Past Surgical History:  Procedure Laterality Date  . LEG SURGERY     bilateral x2 in 2008    There were no vitals filed for this visit.  Subjective Assessment - 03/08/20 1105    Subjective  Patient reports feeling good. She reports mild tightness in legs but not bad. 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 Patient had outpatient PT from [place] to improve strength, balance and gait safety; She stopped in October since she was going to [place] for her cousin's wedding. She reports currently she is still walking without AD. She reports approximtely 2-3 falls each week. She reports her left knee started hurting after one of her falls. She has not followed up with her PCP at this time regarding knee pain; She  reports adherence with HEP when her mom is able to help, She also still has a home health aide (4 hours a week). The Aide is helping with HEP, assists with household chores, etc.    Currently in Pain?  No/denies    Multiple Pain Sites  No            TREATMENT: Standing heel off step stretch 30 sec hold x1 rep each LE, minimal stretch reported;  Patient sidelying:  Exercise:  PT performed passive SLR hamstring stretch 30 sec hold x2 reps bilaterally with ankle DF to facilitate calf stretch;  PT performed passive Quad stretch 30 sec hold x2 reps bilaterally; Patient does exhibit moderate tightness in BLE with increased adductor tone and stiffness in each LE;  Gait on level surface x500 feet x2 sets with min A to CGA for safety; Patient exhibits increased lateral trunk lean and decreased foot clearance on RLE with increased hip/knee flexion in mid stance on LLE. Patient required min VCs to improve step length and increase hip/knee extension in stance as well as increase ankle DF for better foot clearance;  Instructed patient in dynamic balance/gait safety with walking outside on uneven surfaces: Up/down big ramp x1 sets with 0 HHA, min A for safety with mod VCs to improve upper trunk control, increase hip/ankle strategies  for better weight shift and balance recovery. Patient had increased difficulty descending ramp compared to ascending ramp;  Walking on grass  Forward x50 feet x2 sets with 1 HHA, min-mod A for safety; Patient able to exhibit better step length with less scissoring this session indicating less fatigue;  Instructed patient in side stepping on grass x20 feet x1 lap each direction with 1 HHA and min A for safety; Patient had increased difficulty side stepping often increasing hip ER for compensation requiring mod VCs for proper foot placement and positioning;   Instructed patient in balance exercise: Stepping over small shoebox x1 rep, unable with min A and HHA;  Weaving  around cones #3 x2 laps with min A for safety and intermittent HHA with cues to increase step length for better gait safety;  Walking forward and stopping close to cone and picking up #3 from various directions to challenge dynamic balance with start/stop and reaching forward; min A required for safety with increased lateral sway noted;   Stepping over 1/2bolster  forward/backward x5 reps with B rail assist and min VCs to increase hip flexion during swing and improve erect posture; Patient had significant difficulty stepping backwards;   Response to treatment: Patient toleratedwell. She continues to compensate for imbalance with widened base of support/hip ER for better stance control.  Min A was required with most balance exercise for trunk righting and stance control;She does continue to have significant LE weakness particularly in hips which leads to compensation of stance position due to poor gluteal activation;                      PT Education - 03/08/20 1101    Education provided  Yes    Education Details  LE strength, balance/gait safety, HEP    Person(s) Educated  Patient    Methods  Explanation;Verbal cues    Comprehension  Verbalized understanding;Returned demonstration;Verbal cues required;Need further instruction       PT Short Term Goals - 02/03/20 1049      PT SHORT TERM GOAL #1   Title  Patient will be adherent to HEP at least 3x a week to improve functional strength and balance for better safety at home.    Baseline  has HEP, doing it 2-3 times per week dependening on help from Aide/mom; unable to go to gym due to [place], 12/13/19: 2 days 4/15: 2 days    Time  4    Period  Weeks    Status  Partially Met    Target Date  12/21/19      PT SHORT TERM GOAL #2   Title  Patient will exhibit improved safety awareness in the home reporting a maximum of 1 fall in last week.    Baseline  11/23/19: falls 3+ times a week, 12/13/19: no fall this past week,  4/15: 1 fall in last week    Time  4    Period  Weeks    Status  Achieved    Target Date  12/21/19      PT SHORT TERM GOAL #3   Title  Pt will improve BLE hip abductor strength to 3-/5 for improved gluteal activation to improve stance control    Baseline  11/23/19: 2/5, 12/13/19: 2/5, 4/15: 2/5    Time  4    Period  Weeks    Status  Not Met    Target Date  12/21/19        PT Long Term Goals -  02/03/20 1049      PT LONG TERM GOAL #1   Title  Patient will increase Berg Balance score by > 6 points to demonstrate decreased fall risk during functional activities.    Baseline  11/23/19: 24/56, 12/13/19: 26/56    Time  12    Period  Weeks    Status  Partially Met    Target Date  04/27/20      PT LONG TERM GOAL #2   Title  Patient will increase six minute walk test distance to >1000 feet without AD, supervision for progression to community ambulator and improve gait ability    Baseline  11/23/19: 650 feet with min A without AD, multiple stumbles, 12/13/19: 950 feet with min A, no AD, multiple stumbles, 4/15: 1085 feet, min A    Time  12    Period  Weeks    Status  Achieved    Target Date  04/27/20      PT LONG TERM GOAL #3   Title  Pt will improve BLE hip abductor strength to 3+/5 for better functional strength in standing and to improve stability and balance.    Baseline  11/23/19: 2/5 bilaterally; 12/13/19: 2/5 bilaterally, 4/15: 2/5    Time  12    Period  Weeks    Status  Not Met    Target Date  04/27/20      PT LONG TERM GOAL #4   Title  Patient will be independent in bending down towards floor and picking up small object (<5 pounds) and then stand back up without loss of balance as to improve ability to pick up and clean up room at home    Baseline  11/23/19: able to bend down, but loses balance upon standing up; 12/13/19: no change, 4/15: supervision    Time  12    Period  Weeks    Status  Partially Met    Target Date  04/27/20      PT LONG TERM GOAL #5   Title  Patient will reduce  timed up and go to <11 seconds to reduce fall risk and demonstrate improved transfer/gait ability.    Baseline  12/13/19: 16.5 sec without AD, min A, 4/15:17.5 sec without AD, supervision    Time  12    Period  Weeks    Status  Partially Met    Target Date  04/27/20            Plan - 03/08/20 1459    Clinical Impression Statement  Patient motivated and participated well within session. She responds well to LE stretches with improved flexibility. She continues to have increased hip adductor tone which limits step length and gait ability; Patient instructed in advanced gait tasks on uneven surfaces outside. Patient exhibits less fatigue but does have difficulty clearing RLE due to weakness. Patient requires min A for most gait tasks on uneven surfaces. She was instructed in advanced balance exercise requiring min A for safety. She would benefit from additional skilled PT intervention to improve strength, balance and mobility; Plan to address goals next session;    Personal Factors and Comorbidities  Comorbidity 2;Fitness;Past/Current Experience;Time since onset of injury/illness/exacerbation;Age    Comorbidities  CP and anxiety;    Examination-Activity Limitations  Caring for Others;Carry;Lift;Locomotion Level;Reach Overhead;Squat;Stairs;Stand;Transfers    Examination-Participation Restrictions  Church;Cleaning;Community Activity;Driving;Laundry;Medication Management;Meal Prep;Shop;Volunteer;Yard Work    Stability/Clinical Decision Making  Unstable/Unpredictable    Rehab Potential  Good    PT Frequency  1x / week  PT Duration  12 weeks    PT Treatment/Interventions  Patient/family education;Manual techniques;Therapeutic activities;Therapeutic exercise;Electrical Stimulation;Aquatic Therapy;DME Instruction;Functional mobility training;Gait training;Neuromuscular re-education;ADLs/Self Care Home Management;Cryotherapy;Moist Heat;Stair training;Orthotic Fit/Training;Energy conservation    PT  Next Visit Plan  assess/adjust HEP, walking program, strength and balance    PT Home Exercise Plan  had HEP, will adjust next session;    Consulted and Agree with Plan of Care  Patient       Patient will benefit from skilled therapeutic intervention in order to improve the following deficits and impairments:  Abnormal gait, Decreased balance, Decreased endurance, Decreased mobility, Difficulty walking, Cardiopulmonary status limiting activity, Decreased activity tolerance, Decreased safety awareness, Decreased strength  Visit Diagnosis: Muscle weakness (generalized)  Unsteadiness on feet     Problem List Patient Active Problem List   Diagnosis Date Noted  . Loss of weight 07/17/2015  . Depression 07/17/2015  . Cerebral palsy ([place]) 07/17/2015    Darrin Apodaca PT, DPT 03/08/2020, 3:09 PM  [place] MAIN [facility] SERVICES [address] [place], [place], [address] Phone: [phone]   Fax:  [phone]  Name: Renee Walton MRN: [id number] Date of Birth: 1996-06-18

## 2020-03-14 ENCOUNTER — Ambulatory Visit: Payer: Medicaid Other | Admitting: Physical Therapy

## 2020-03-14 ENCOUNTER — Ambulatory Visit: Payer: Medicaid Other | Attending: Internal Medicine

## 2020-03-14 DIAGNOSIS — Z23 Encounter for immunization: Secondary | ICD-10-CM

## 2020-03-14 NOTE — Progress Notes (Signed)
   Covid-19 Vaccination Clinic  Name:  Harlene Petralia    MRN: [id number] DOB: September 25, 1996  03/14/2020  Ms. Lepp was observed post Covid-19 immunization for 15 minutes without incident. She was provided with Vaccine Information Sheet and instruction to access the V-Safe system.   Ms. Lorenzo was instructed to call 911 with any severe reactions post vaccine: Marland Kitchen Difficulty breathing  . Swelling of face and throat  . A fast heartbeat  . A bad rash all over body  . Dizziness and weakness   Immunizations Administered    Name Date Dose VIS Date Route   Pfizer COVID-19 Vaccine 03/14/2020  2:07 PM 0.3 mL 12/15/2018 Intramuscular   Manufacturer: [employer], [employer]   Lot: [id number]   NDC: [address]-6

## 2020-03-29 ENCOUNTER — Encounter: Payer: Self-pay | Admitting: Physical Therapy

## 2020-03-29 ENCOUNTER — Ambulatory Visit: Payer: Medicaid Other | Attending: Physical Medicine and Rehabilitation | Admitting: Physical Therapy

## 2020-03-29 ENCOUNTER — Other Ambulatory Visit: Payer: Self-pay

## 2020-03-29 DIAGNOSIS — R2681 Unsteadiness on feet: Secondary | ICD-10-CM | POA: Insufficient documentation

## 2020-03-29 DIAGNOSIS — M6281 Muscle weakness (generalized): Secondary | ICD-10-CM

## 2020-03-29 NOTE — Therapy (Signed)
[place] MAIN [facility] SERVICES [address] [place], [place], [address] Phone: [phone]   Fax:  [phone]  Physical Therapy Treatment  Patient Details  Name: Renee Walton MRN: [id number] Date of Birth: December 09, 1995 Referring Provider (PT): [place], [place]   Encounter Date: 03/29/2020  PT End of Session - 03/29/20 1055    Visit Number  15    Number of Visits  25    Date for PT Re-Evaluation  04/27/20    Authorization Type  medicaid    Authorization Time Period  12 visits approved, 6/7-8/29    Authorization - Visit Number  1    Authorization - Number of Visits  12    PT Start Time  1055    PT Stop Time  5909    PT Time Calculation (min)  50 min    Equipment Utilized During Treatment  Gait belt    Activity Tolerance  Patient tolerated treatment well;No increased pain    Behavior During Therapy  WFL for tasks assessed/performed       Past Medical History:  Diagnosis Date  . Breakthrough bleeding   . Cerebral palsy ([place])   . GAD (generalized anxiety disorder)   . Migraine headache     Past Surgical History:  Procedure Laterality Date  . LEG SURGERY     bilateral x2 in 2008    There were no vitals filed for this visit.  Subjective Assessment - 03/29/20 1104    Subjective  Patient reports doing well; She reports going to the gym yesterday and doing the treadmill, leg press/hamstring curl machine etc. 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 Patient had outpatient PT from [facility] to improve strength, balance and gait safety; She stopped in October since she was going to [place] for her cousin's wedding. She reports currently she is still walking without AD. She reports approximtely 2-3 falls each week. She reports her left knee started hurting after one of her falls.  She has not followed up with her PCP at this time regarding knee pain; She reports adherence with HEP when her mom is able to help, She also still has a home health aide (4 hours a week). The Aide is helping with HEP, assists with household chores, etc.    Limitations  Standing;Walking    How long can you stand comfortably?  10 min    How long can you walk comfortably?  10-15 min, limited due to knee pain    Diagnostic tests  none recent    Patient Stated Goals  balance, improve safety and stability with walking.    Currently in Pain?  No/denies    Multiple Pain Sites  No         [facility] PT Assessment - 03/29/20 0001      Observation/Other Assessments   Focus on Therapeutic Outcomes (FOTO)   51.3%    Activities of Balance Confidence Scale (ABC Scale)   30%       TREATMENT: Warm up on treadmill 1.5 mph x4 min with 2 HHA with close supervision with min VCs to increase step length, improve erect posture, improve DF at heel strike especially on RLE; Patient ambulates with heavy lateral lean and short step length especially on RLE with toe turned out and decreased DF at heel strike due to  weakness and tone in RLE; Vitals following gait, HR 138 BPM   Standing outside step: Standing on airex: Feet together:  Forward/backward weight shift with arms across chest x5 reps, min A required with cues to avoid using arms to initiate weight shift to facilitate increased hip/ankle strategies  Side/side rotation x5 reps each direction; requires min A for safety;  Standing one foot on airex and one foot on 6 inch step:  BUE ball side/side x10 reps each foot on step with min A for safety and cues for weight shift/hip position for better stance control;   Resisted walking 12.5# forward/backward x2 laps with min A and 1 HHA for balance with mod VCs for forward weight shift for better dynamic balance control;   Instructed patient in [place] survey to update goals;  Patient instructed in LE  strengthening: Standing beside mat table, side stepping with red tband around BLE x10 feet x5 laps each direction with mod VCs to avoid hip ER to isolate hip abductor strengthening. Patient exhibits increased toe out with increased repetition due to weakness in hip abductors;    response to treatment: patient tolerated well. She reports minimal fatigue at end of session. She reports working on gym exercises at least 1-2x a week. She exhibits better stance control this session with less fatigue. However patient continues to compensate for weakness in hip abductors with increase toe out and lateral lean;                 PT Education - 03/29/20 1055    Education provided  Yes    Education Details  LE strength, balance/gait safety, HEP    Person(s) Educated  Patient    Methods  Explanation;Verbal cues    Comprehension  Verbalized understanding;Returned demonstration;Verbal cues required;Need further instruction       PT Short Term Goals - 03/29/20 1122      PT SHORT TERM GOAL #1   Title  Patient will be adherent to HEP at least 3x a week to improve functional strength and balance for better safety at home.    Baseline  has HEP, doing it 2-3 times per week dependening on help from Aide/mom; unable to go to gym due to [place], 12/13/19: 2 days 4/15: 2 days, 6/9: 1-2x a week    Time  4    Period  Weeks    Status  Partially Met    Target Date  12/21/19      PT SHORT TERM GOAL #2   Title  Patient will exhibit improved safety awareness in the home reporting a maximum of 1 fall in last week.    Baseline  11/23/19: falls 3+ times a week, 12/13/19: no fall this past week, 4/15: 1 fall in last week    Time  4    Period  Weeks    Status  Achieved    Target Date  12/21/19      PT SHORT TERM GOAL #3   Title  Pt will improve BLE hip abductor strength to 3-/5 for improved gluteal activation to improve stance control    Baseline  11/23/19: 2/5, 12/13/19: 2/5, 4/15: 2/5, 6/9: 2/5 strength     Time  4    Period  Weeks    Status  Not Met    Target Date  12/21/19        PT Long Term Goals - 03/29/20 1123      PT LONG TERM GOAL #1   Title  Patient will  increase Berg Balance score by > 6 points to demonstrate decreased fall risk during functional activities.    Baseline  11/23/19: 24/56, 12/13/19: 26/56    Time  12    Period  Weeks    Status  Partially Met    Target Date  04/27/20      PT LONG TERM GOAL #2   Title  Patient will increase six minute walk test distance to >1000 feet without AD, supervision for progression to community ambulator and improve gait ability    Baseline  11/23/19: 650 feet with min A without AD, multiple stumbles, 12/13/19: 950 feet with min A, no AD, multiple stumbles, 4/15: 1085 feet, min A    Time  12    Period  Weeks    Status  Achieved    Target Date  04/27/20      PT LONG TERM GOAL #3   Title  Pt will improve BLE hip abductor strength to 3+/5 for better functional strength in standing and to improve stability and balance.    Baseline  11/23/19: 2/5 bilaterally; 12/13/19: 2/5 bilaterally, 4/15: 2/5, 6/9: 2/5 strength bilaterally;    Time  12    Period  Weeks    Status  Not Met    Target Date  04/27/20      PT LONG TERM GOAL #4   Title  Patient will be independent in bending down towards floor and picking up small object (<5 pounds) and then stand back up without loss of balance as to improve ability to pick up and clean up room at home    Baseline  11/23/19: able to bend down, but loses balance upon standing up; 12/13/19: no change, 4/15: supervision    Time  12    Period  Weeks    Status  Partially Met    Target Date  04/27/20      PT LONG TERM GOAL #5   Title  Patient will reduce timed up and go to <11 seconds to reduce fall risk and demonstrate improved transfer/gait ability.    Baseline  12/13/19: 16.5 sec without AD, min A, 4/15:17.5 sec without AD, supervision    Time  12    Period  Weeks    Status  Partially Met    Target Date  04/27/20             Plan - 03/29/20 1219    Clinical Impression Statement  Patient motivated and participated well within session. Instructed patient in advanced balance exercises. Patient does require min Vcs for proper weight shift and positioning for optimal stance control. She continues to have heavy toe out and increased upper trunk lean with decreased hip/knee/ankle strategies due to weakness. patient requires min A when walking against resistance due to weakness and imbalance. She did seem to tolerate standing for longer period of time this session requiring fewer rest breaks. Patient would benefit from additional skilled PT intervention to improve strength, balance and mobility;    Personal Factors and Comorbidities  Comorbidity 2;Fitness;Past/Current Experience;Time since onset of injury/illness/exacerbation;Age    Comorbidities  CP and anxiety;    Examination-Activity Limitations  Caring for Others;Carry;Lift;Locomotion Level;Reach Overhead;Squat;Stairs;Stand;Transfers    Examination-Participation Restrictions  Church;Cleaning;Community Activity;Driving;Laundry;Medication Management;Meal Prep;Shop;Volunteer;Yard Work    Stability/Clinical Decision Making  Unstable/Unpredictable    Rehab Potential  Good    PT Frequency  1x / week    PT Duration  12 weeks    PT Treatment/Interventions  Patient/family education;Manual techniques;Therapeutic activities;Therapeutic exercise;Electrical Stimulation;Aquatic Therapy;DME  Instruction;Functional mobility training;Gait training;Neuromuscular re-education;ADLs/Self Care Home Management;Cryotherapy;Moist Heat;Stair training;Orthotic Fit/Training;Energy conservation    PT Next Visit Plan  assess/adjust HEP, walking program, strength and balance    PT Home Exercise Plan  had HEP, will adjust next session;    Consulted and Agree with Plan of Care  Patient       Patient will benefit from skilled therapeutic intervention in order to improve the following  deficits and impairments:  Abnormal gait, Decreased balance, Decreased endurance, Decreased mobility, Difficulty walking, Cardiopulmonary status limiting activity, Decreased activity tolerance, Decreased safety awareness, Decreased strength  Visit Diagnosis: Muscle weakness (generalized)  Unsteadiness on feet     Problem List Patient Active Problem List   Diagnosis Date Noted  . Loss of weight 07/17/2015  . Depression 07/17/2015  . Cerebral palsy ([place]) 07/17/2015    Haadi Santellan PT, DPT 03/29/2020, 12:22 PM  [place] MAIN [facility] SERVICES [address] [place], [place], [address] Phone: [phone]   Fax:  [phone]  Name: Renee Walton MRN: [id number] Date of Birth: Apr 27, 1996

## 2020-04-05 ENCOUNTER — Encounter: Payer: Self-pay | Admitting: Physical Therapy

## 2020-04-05 ENCOUNTER — Other Ambulatory Visit: Payer: Self-pay

## 2020-04-05 ENCOUNTER — Ambulatory Visit: Payer: Medicaid Other | Admitting: Physical Therapy

## 2020-04-05 DIAGNOSIS — R2681 Unsteadiness on feet: Secondary | ICD-10-CM

## 2020-04-05 DIAGNOSIS — M6281 Muscle weakness (generalized): Secondary | ICD-10-CM

## 2020-04-05 NOTE — Therapy (Signed)
[place] MAIN [facility] SERVICES [address] [place], [place], [address] Phone: [phone]   Fax:  [phone]  Physical Therapy Treatment  Patient Details  Name: Renee Walton MRN: [id number] Date of Birth: 07/27/1996 Referring Provider (PT): [place], [place]   Encounter Date: 04/05/2020   PT End of Session - 04/05/20 1108    Visit Number 16    Number of Visits 25    Date for PT Re-Evaluation 04/27/20    Authorization Type medicaid    Authorization Time Period 12 visits approved, 6/7-8/29    Authorization - Visit Number 2    Authorization - Number of Visits 12    PT Start Time 1324    PT Stop Time 1145    PT Time Calculation (min) 43 min    Equipment Utilized During Treatment Gait belt    Activity Tolerance Patient tolerated treatment well;No increased pain    Behavior During Therapy WFL for tasks assessed/performed           Past Medical History:  Diagnosis Date  . Breakthrough bleeding   . Cerebral palsy ([place])   . GAD (generalized anxiety disorder)   . Migraine headache     Past Surgical History:  Procedure Laterality Date  . LEG SURGERY     bilateral x2 in 2008    There were no vitals filed for this visit.   Subjective Assessment - 04/05/20 1107    Subjective Patient reports doing okay. She reports still having trouble walking especially outside. She denies any new falls.    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 Patient had outpatient PT from [place] to improve strength, balance and gait safety; She stopped in October since she was going to [place] for her cousin's wedding. She reports currently she is still walking without AD. She reports approximtely 2-3 falls each week. She reports her left knee started hurting after one of her falls. She has not followed up with her PCP at this time regarding knee pain; She reports adherence with  HEP when her mom is able to help, She also still has a home health aide (4 hours a week). The Aide is helping with HEP, assists with household chores, etc.    Limitations Standing;Walking    How long can you stand comfortably? 10 min    How long can you walk comfortably? 10-15 min, limited due to knee pain    Diagnostic tests none recent    Patient Stated Goals balance, improve safety and stability with walking.    Currently in Pain? No/denies    Multiple Pain Sites No              TREATMENT:  Patient ambulated on level surface x500 feet x2 sets with CGA for safety and cues for weight shift and to improve step length for better foot clearance;  Progressed gait training to walking outside on unlevel surface  x200 feet on grass  x150 feet on concrete incline/decline When walking on grass patient required 1 HHA and min A for gait safety with increased lateral trunk lean and instability due to unstable surface; She was able to walk on concrete with CGA with reciprocal gait pattern, increased toe out with increased lateral trunk lean due to hip abductor weakness. Patient does tend to walk quickly often losing balance forward.   She does require min A when changing surfaces due to instability;  Patient instructed  in dynamic balance: Negotiating cones #5 x3 sets with Min A progressing to CGA for safety requiring min VCs for foot clearance and weight shift; Patient was able to progress to CGA exhibiting better safety awareness during turns;  Patient tolerated session well. Patient is progressing with dynamic balance this session with improved turning and negotiating obstacles. She did have trouble walking on grass due to instability; reinforced HEP;                        PT Education - 04/05/20 1108    Education provided Yes    Education Details LE strength, balance/gait safety HEP    Person(s) Educated Patient    Methods Explanation;Verbal cues    Comprehension  Verbalized understanding;Returned demonstration;Verbal cues required;Need further instruction            PT Short Term Goals - 03/29/20 1122      PT SHORT TERM GOAL #1   Title Patient will be adherent to HEP at least 3x a week to improve functional strength and balance for better safety at home.    Baseline has HEP, doing it 2-3 times per week dependening on help from Aide/mom; unable to go to gym due to COVID, 12/13/19: 2 days 4/15: 2 days, 6/9: 1-2x a week    Time 4    Period Weeks    Status Partially Met    Target Date 12/21/19      PT SHORT TERM GOAL #2   Title Patient will exhibit improved safety awareness in the home reporting a maximum of 1 fall in last week.    Baseline 11/23/19: falls 3+ times a week, 12/13/19: no fall this past week, 4/15: 1 fall in last week    Time 4    Period Weeks    Status Achieved    Target Date 12/21/19      PT SHORT TERM GOAL #3   Title Pt will improve BLE hip abductor strength to 3-/5 for improved gluteal activation to improve stance control    Baseline 11/23/19: 2/5, 12/13/19: 2/5, 4/15: 2/5, 6/9: 2/5 strength    Time 4    Period Weeks    Status Not Met    Target Date 12/21/19             PT Long Term Goals - 03/29/20 1123      PT LONG TERM GOAL #1   Title Patient will increase Berg Balance score by > 6 points to demonstrate decreased fall risk during functional activities.    Baseline 11/23/19: 24/56, 12/13/19: 26/56    Time 12    Period Weeks    Status Partially Met    Target Date 04/27/20      PT LONG TERM GOAL #2   Title Patient will increase six minute walk test distance to >1000 feet without AD, supervision for progression to community ambulator and improve gait ability    Baseline 11/23/19: 650 feet with min A without AD, multiple stumbles, 12/13/19: 950 feet with min A, no AD, multiple stumbles, 4/15: 1085 feet, min A    Time 12    Period Weeks    Status Achieved    Target Date 04/27/20      PT LONG TERM GOAL #3   Title Pt will  improve BLE hip abductor strength to 3+/5 for better functional strength in standing and to improve stability and balance.    Baseline 11/23/19: 2/5 bilaterally; 12/13/19: 2/5 bilaterally, 4/15: 2/5, 6/9:   2/5 strength bilaterally;    Time 12    Period Weeks    Status Not Met    Target Date 04/27/20      PT LONG TERM GOAL #4   Title Patient will be independent in bending down towards floor and picking up small object (<5 pounds) and then stand back up without loss of balance as to improve ability to pick up and clean up room at home    Baseline 11/23/19: able to bend down, but loses balance upon standing up; 12/13/19: no change, 4/15: supervision    Time 12    Period Weeks    Status Partially Met    Target Date 04/27/20      PT LONG TERM GOAL #5   Title Patient will reduce timed up and go to <11 seconds to reduce fall risk and demonstrate improved transfer/gait ability.    Baseline 12/13/19: 16.5 sec without AD, min A, 4/15:17.5 sec without AD, supervision    Time 12    Period Weeks    Status Partially Met    Target Date 04/27/20                 Plan - 04/05/20 1242    Clinical Impression Statement Patient motivated and participated well within session. Instructed patient in gait on level/unlevel surfaces. Patient does require 1 HHA when walking on thick grass. Patient requires min VCs to increase hip flexion for better foot clearance. She was able to walk on concrete/level surfaces with CGA. Patient does fatigue with prolonged ambulation with increased shortness of breath. Patient was able to progress with dynamic balance with negotiating cones with CGA. She does continue to have increased lateral trunk lean due to hip abductor weakness which leads to increased instability. She would benefit from additional skilled PT intervention to improve strength, balance and mobility;    Personal Factors and Comorbidities Comorbidity 2;Fitness;Past/Current Experience;Time since onset of  injury/illness/exacerbation;Age    Comorbidities CP and anxiety;    Examination-Activity Limitations Caring for Others;Carry;Lift;Locomotion Level;Reach Overhead;Squat;Stairs;Stand;Transfers    Examination-Participation Restrictions Church;Cleaning;Community Activity;Driving;Laundry;Medication Management;Meal Prep;Shop;Volunteer;Yard Work    Stability/Clinical Decision Making Unstable/Unpredictable    Rehab Potential Good    PT Frequency 1x / week    PT Duration 12 weeks    PT Treatment/Interventions Patient/family education;Manual techniques;Therapeutic activities;Therapeutic exercise;Electrical Stimulation;Aquatic Therapy;DME Instruction;Functional mobility training;Gait training;Neuromuscular re-education;ADLs/Self Care Home Management;Cryotherapy;Moist Heat;Stair training;Orthotic Fit/Training;Energy conservation    PT Next Visit Plan assess/adjust HEP, walking program, strength and balance    PT Home Exercise Plan had HEP, will adjust next session;    Consulted and Agree with Plan of Care Patient           Patient will benefit from skilled therapeutic intervention in order to improve the following deficits and impairments:  Abnormal gait, Decreased balance, Decreased endurance, Decreased mobility, Difficulty walking, Cardiopulmonary status limiting activity, Decreased activity tolerance, Decreased safety awareness, Decreased strength  Visit Diagnosis: Muscle weakness (generalized)  Unsteadiness on feet     Problem List Patient Active Problem List   Diagnosis Date Noted  . Loss of weight 07/17/2015  . Depression 07/17/2015  . Cerebral palsy ([facility]) 07/17/2015    Trotter,Margaret PT, DPT 04/05/2020, 12:53 PM  [place] [facility] MA[facility] REH[facility] SERVICES [address] [place], [place], [address] Phone: [phone]   Fax:  [phone]  Name: Renee Walton MRN: [id number] Date of Birth: 11/21/1995   

## 2020-04-12 ENCOUNTER — Ambulatory Visit: Payer: Medicaid Other | Admitting: Physical Therapy

## 2020-04-12 ENCOUNTER — Other Ambulatory Visit: Payer: Self-pay

## 2020-04-12 ENCOUNTER — Encounter: Payer: Self-pay | Admitting: Physical Therapy

## 2020-04-12 DIAGNOSIS — R2681 Unsteadiness on feet: Secondary | ICD-10-CM

## 2020-04-12 DIAGNOSIS — M6281 Muscle weakness (generalized): Secondary | ICD-10-CM

## 2020-04-12 NOTE — Therapy (Signed)
[place] MAIN [facility] SERVICES [address] [place], [place], [address] Phone: [phone]   Fax:  [phone]  Physical Therapy Treatment  Patient Details  Name: Renee Walton MRN: [id number] Date of Birth: Apr 16, 1996 Referring Provider (PT): [place], [place]   Encounter Date: 04/12/2020   PT End of Session - 04/12/20 1105    Visit Number 17    Number of Visits 25    Date for PT Re-Evaluation 04/27/20    Authorization Type medicaid    Authorization Time Period 12 visits approved, 6/7-8/29    Authorization - Visit Number 3    Authorization - Number of Visits 12    PT Start Time 0258    PT Stop Time 5277    PT Time Calculation (min) 43 min    Equipment Utilized During Treatment Gait belt    Activity Tolerance Patient tolerated treatment well;No increased pain    Behavior During Therapy WFL for tasks assessed/performed           Past Medical History:  Diagnosis Date  . Breakthrough bleeding   . Cerebral palsy ([place])   . GAD (generalized anxiety disorder)   . Migraine headache     Past Surgical History:  Procedure Laterality Date  . LEG SURGERY     bilateral x2 in 2008    There were no vitals filed for this visit.   Subjective Assessment - 04/12/20 1135    Subjective Patient reports doing well; She reports some soreness/stiffness in bilateral calf. 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 Patient had outpatient PT from [place] to improve strength, balance and gait safety; She stopped in October since she was going to [place]  for her cousin's wedding. She reports currently she is still walking without AD. She reports approximtely 2-3 falls each week. She reports her left knee started hurting after one of her falls. She has not followed up with her PCP at this time regarding knee pain; She  reports adherence with HEP when her mom is able to help, She also still has a home health aide (4 hours a week). The Aide is helping with HEP, assists with household chores, etc.    Limitations Standing;Walking    How long can you stand comfortably? 10 min    How long can you walk comfortably? 10-15 min, limited due to knee pain    Diagnostic tests none recent    Patient Stated Goals balance, improve safety and stability with walking.    Currently in Pain? No/denies    Multiple Pain Sites No                  TREATMENT:  Patient ambulated on level surface x500 feet x2 sets with CGA for safety and cues for weight shift and to improve step length for better foot clearance;  Progressed gait training to walking outside on unlevel surface  x200 feet on grass  x150 feet on concrete incline/decline When walking on grass patient required 1 HHA and min A for gait safety with increased lateral trunk lean and instability due to unstable surface; She was able to walk on concrete with CGA with reciprocal gait pattern, increased toe out with increased lateral trunk lean due to hip abductor weakness. Patient does tend to walk quickly often losing balance forward.   She does require min A when changing  surfaces due to instability;  Patient instructed in dynamic balance: Resisted walking 7.5# forward/backward x2 laps with CGA for safety, Patient does require min VCs for weight shift for better dynamic balance.   Patient tolerated session well. Patient is progressing with dynamic balance this session with improved weight shift with resisted walking. She does continue to require min A when walking in thick grass and does exhibit increased toe out as compensation for hip abductor weakness with increased lateral trunk lean;                       PT Education - 04/12/20 1136    Education provided Yes    Education Details LE strength, balance/gait safety, HEP    Person(s)  Educated Patient    Methods Explanation;Verbal cues    Comprehension Verbalized understanding;Returned demonstration;Verbal cues required;Need further instruction            PT Short Term Goals - 03/29/20 1122      PT SHORT TERM GOAL #1   Title Patient will be adherent to HEP at least 3x a week to improve functional strength and balance for better safety at home.    Baseline has HEP, doing it 2-3 times per week dependening on help from Aide/mom; unable to go to gym due to [place], 12/13/19: 2 days 4/15: 2 days, 6/9: 1-2x a week    Time 4    Period Weeks    Status Partially Met    Target Date 12/21/19      PT SHORT TERM GOAL #2   Title Patient will exhibit improved safety awareness in the home reporting a maximum of 1 fall in last week.    Baseline 11/23/19: falls 3+ times a week, 12/13/19: no fall this past week, 4/15: 1 fall in last week    Time 4    Period Weeks    Status Achieved    Target Date 12/21/19      PT SHORT TERM GOAL #3   Title Pt will improve BLE hip abductor strength to 3-/5 for improved gluteal activation to improve stance control    Baseline 11/23/19: 2/5, 12/13/19: 2/5, 4/15: 2/5, 6/9: 2/5 strength    Time 4    Period Weeks    Status Not Met    Target Date 12/21/19             PT Long Term Goals - 03/29/20 1123      PT LONG TERM GOAL #1   Title Patient will increase Berg Balance score by > 6 points to demonstrate decreased fall risk during functional activities.    Baseline 11/23/19: 24/56, 12/13/19: 26/56    Time 12    Period Weeks    Status Partially Met    Target Date 04/27/20      PT LONG TERM GOAL #2   Title Patient will increase six minute walk test distance to >1000 feet without AD, supervision for progression to community ambulator and improve gait ability    Baseline 11/23/19: 650 feet with min A without AD, multiple stumbles, 12/13/19: 950 feet with min A, no AD, multiple stumbles, 4/15: 1085 feet, min A    Time 12    Period Weeks    Status Achieved     Target Date 04/27/20      PT LONG TERM GOAL #3   Title Pt will improve BLE hip abductor strength to 3+/5 for better functional strength in standing and to improve stability and balance.  Baseline 11/23/19: 2/5 bilaterally; 12/13/19: 2/5 bilaterally, 4/15: 2/5, 6/9: 2/5 strength bilaterally;    Time 12    Period Weeks    Status Not Met    Target Date 04/27/20      PT LONG TERM GOAL #4   Title Patient will be independent in bending down towards floor and picking up small object (<5 pounds) and then stand back up without loss of balance as to improve ability to pick up and clean up room at home    Baseline 11/23/19: able to bend down, but loses balance upon standing up; 12/13/19: no change, 4/15: supervision    Time 12    Period Weeks    Status Partially Met    Target Date 04/27/20      PT LONG TERM GOAL #5   Title Patient will reduce timed up and go to <11 seconds to reduce fall risk and demonstrate improved transfer/gait ability.    Baseline 12/13/19: 16.5 sec without AD, min A, 4/15:17.5 sec without AD, supervision    Time 12    Period Weeks    Status Partially Met    Target Date 04/27/20                 Plan - 04/12/20 1151    Clinical Impression Statement Patient motivated and participated well within session. She does exhibit better dynamic gait safety when walking on level surface being CGA. She does require 1 HHA when walking in thick grass or on unlevel surfaces. Patient does fatigue with prolonged standing/ambulation. She was instructed in dynamic balance exercise with resisted walking. Patient does require CGA for safety but was able to exhibit better weight shift exhibiting improved dynamic balance control. She would benefit from additional skilled PT intervention to improve strength, balance and mobility;    Personal Factors and Comorbidities Comorbidity 2;Fitness;Past/Current Experience;Time since onset of injury/illness/exacerbation;Age    Comorbidities CP and anxiety;     Examination-Activity Limitations Caring for Others;Carry;Lift;Locomotion Level;Reach Overhead;Squat;Stairs;Stand;Transfers    Examination-Participation Restrictions Church;Cleaning;Community Activity;Driving;Laundry;Medication Management;Meal Prep;Shop;Volunteer;Yard Work    Stability/Clinical Decision Making Unstable/Unpredictable    Rehab Potential Good    PT Frequency 1x / week    PT Duration 12 weeks    PT Treatment/Interventions Patient/family education;Manual techniques;Therapeutic activities;Therapeutic exercise;Electrical Stimulation;Aquatic Therapy;DME Instruction;Functional mobility training;Gait training;Neuromuscular re-education;ADLs/Self Care Home Management;Cryotherapy;Moist Heat;Stair training;Orthotic Fit/Training;Energy conservation    PT Next Visit Plan assess/adjust HEP, walking program, strength and balance    PT Home Exercise Plan had HEP, will adjust next session;    Consulted and Agree with Plan of Care Patient           Patient will benefit from skilled therapeutic intervention in order to improve the following deficits and impairments:  Abnormal gait, Decreased balance, Decreased endurance, Decreased mobility, Difficulty walking, Cardiopulmonary status limiting activity, Decreased activity tolerance, Decreased safety awareness, Decreased strength  Visit Diagnosis: Muscle weakness (generalized)  Unsteadiness on feet     Problem List Patient Active Problem List   Diagnosis Date Noted  . Loss of weight 07/17/2015  . Depression 07/17/2015  . Cerebral palsy ([place]) 07/17/2015    Shea Kapur PT, DPT 04/12/2020, 11:53 AM  [place] MAIN [facility] SERVICES [address] [place], [place], [address] Phone: [phone]   Fax:  [phone]  Name: Renee Walton MRN: [id number] Date of Birth: 01-13-1996

## 2020-04-18 ENCOUNTER — Other Ambulatory Visit: Payer: Self-pay

## 2020-04-18 ENCOUNTER — Encounter: Payer: Self-pay | Admitting: Physical Therapy

## 2020-04-18 ENCOUNTER — Ambulatory Visit: Payer: Medicaid Other | Admitting: Physical Therapy

## 2020-04-18 DIAGNOSIS — M6281 Muscle weakness (generalized): Secondary | ICD-10-CM | POA: Diagnosis not present

## 2020-04-18 DIAGNOSIS — R2681 Unsteadiness on feet: Secondary | ICD-10-CM

## 2020-04-18 NOTE — Therapy (Signed)
[place] MAIN [facility] SERVICES [address] [place], [place], [address] Phone: [phone]   Fax:  [phone]  Physical Therapy Treatment  Patient Details  Name: Renee Walton MRN: [id number] Date of Birth: 1996-03-20 Referring Provider (PT): [place], [place]   Encounter Date: 04/18/2020   PT End of Session - 04/18/20 1344    Visit Number 18    Number of Visits 25    Date for PT Re-Evaluation 04/27/20    Authorization Type medicaid    Authorization Time Period 12 visits approved, 6/7-8/29    Authorization - Visit Number 4    Authorization - Number of Visits 12    PT Start Time 8032    PT Stop Time 1430    PT Time Calculation (min) 45 min    Equipment Utilized During Treatment Gait belt    Activity Tolerance Patient tolerated treatment well;No increased pain    Behavior During Therapy WFL for tasks assessed/performed           Past Medical History:  Diagnosis Date  . Breakthrough bleeding   . Cerebral palsy ([place])   . GAD (generalized anxiety disorder)   . Migraine headache     Past Surgical History:  Procedure Laterality Date  . LEG SURGERY     bilateral x2 in 2008    There were no vitals filed for this visit.   Subjective Assessment - 04/19/20 0922    Subjective Patient reports doing well. 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 Patient had outpatient PT from [place] to improve strength, balance and gait safety; She stopped in October since she was going to [place] for her cousin's wedding. She reports currently she is still walking without AD. She reports approximtely 2-3 falls each week. She reports her left knee started hurting after one of her falls. She has not followed up with her PCP at this time regarding knee pain; She  reports adherence with HEP when her mom is able to help, She also still has a home health aide (4 hours a week). The Aide is helping with HEP, assists with household chores, etc.    Limitations Standing;Walking    How long can you stand comfortably? 10 min    How long can you walk comfortably? 10-15 min, limited due to knee pain    Diagnostic tests none recent    Patient Stated Goals balance, improve safety and stability with walking.    Currently in Pain? No/denies    Multiple Pain Sites No                TREATMENT:  Patient ambulated on level surface x500 feet x2 sets with CGA for safety and cues for weight shift and to improve step length for better foot clearance;  Progressed gait training to walking outside on unlevel surface x200 feet on grass  Forward march x10 feet  Side stepping x20 feet each direction Unsupported standing in grass with feet apart:  Trunk rotation reaching across body x5 reps;  Eyes closed 10 sec hold  Attempted reaction strategies but too difficult  Side/side weight shift x10 feet; Patient required CGA to min A with balance activities. She was able to exhibit good static stance with reaching across body being close supervision. However unable to achieve reaction  strategies due to weakness and imbalance;   Gait x150 feet on concrete incline/decline When walking on grass patient required 1 HHA and min A for gait safety with increased lateral trunk lean and instability due to unstable surface; She was able to walk on concrete with CGA with reciprocal gait pattern, increased toe out with increased lateral trunk lean due to hip abductor weakness. Patient does tend to walk quickly often losing balance forward.   She does require min A when changing surfaces due to instability;  Standing on firm surface feet slightly apart: Throwing ball into basketball hoop x10 throws with cues for underhand to facilitate wrist extension and finger extension; Patient  required min A for safety;  Picking up balls from floor #5 with min A for safety;  Patient tolerated session well. Patient is progressing with dynamic balance this session with improved weight shift with standing activities. She does continue to require min A when walking in thick grass and does exhibit increased toe out as compensation for hip abductor weakness with increased lateral trunk lean;                   PT Education - 04/18/20 1344    Education provided Yes    Education Details LE strength, balance/gait safety, HEP    Person(s) Educated Patient    Methods Explanation;Verbal cues    Comprehension Verbalized understanding;Returned demonstration;Verbal cues required;Need further instruction            PT Short Term Goals - 03/29/20 1122      PT SHORT TERM GOAL #1   Title Patient will be adherent to HEP at least 3x a week to improve functional strength and balance for better safety at home.    Baseline has HEP, doing it 2-3 times per week dependening on help from Aide/mom; unable to go to gym due to [place], 12/13/19: 2 days 4/15: 2 days, 6/9: 1-2x a week    Time 4    Period Weeks    Status Partially Met    Target Date 12/21/19      PT SHORT TERM GOAL #2   Title Patient will exhibit improved safety awareness in the home reporting a maximum of 1 fall in last week.    Baseline 11/23/19: falls 3+ times a week, 12/13/19: no fall this past week, 4/15: 1 fall in last week    Time 4    Period Weeks    Status Achieved    Target Date 12/21/19      PT SHORT TERM GOAL #3   Title Pt will improve BLE hip abductor strength to 3-/5 for improved gluteal activation to improve stance control    Baseline 11/23/19: 2/5, 12/13/19: 2/5, 4/15: 2/5, 6/9: 2/5 strength    Time 4    Period Weeks    Status Not Met    Target Date 12/21/19             PT Long Term Goals - 03/29/20 1123      PT LONG TERM GOAL #1   Title Patient will increase Berg Balance score by > 6 points to  demonstrate decreased fall risk during functional activities.    Baseline 11/23/19: 24/56, 12/13/19: 26/56    Time 12    Period Weeks    Status Partially Met    Target Date 04/27/20      PT LONG TERM GOAL #2   Title Patient will increase six minute walk test distance to >1000 feet without AD,  supervision for progression to community ambulator and improve gait ability    Baseline 11/23/19: 650 feet with min A without AD, multiple stumbles, 12/13/19: 950 feet with min A, no AD, multiple stumbles, 4/15: 1085 feet, min A    Time 12    Period Weeks    Status Achieved    Target Date 04/27/20      PT LONG TERM GOAL #3   Title Pt will improve BLE hip abductor strength to 3+/5 for better functional strength in standing and to improve stability and balance.    Baseline 11/23/19: 2/5 bilaterally; 12/13/19: 2/5 bilaterally, 4/15: 2/5, 6/9: 2/5 strength bilaterally;    Time 12    Period Weeks    Status Not Met    Target Date 04/27/20      PT LONG TERM GOAL #4   Title Patient will be independent in bending down towards floor and picking up small object (<5 pounds) and then stand back up without loss of balance as to improve ability to pick up and clean up room at home    Baseline 11/23/19: able to bend down, but loses balance upon standing up; 12/13/19: no change, 4/15: supervision    Time 12    Period Weeks    Status Partially Met    Target Date 04/27/20      PT LONG TERM GOAL #5   Title Patient will reduce timed up and go to <11 seconds to reduce fall risk and demonstrate improved transfer/gait ability.    Baseline 12/13/19: 16.5 sec without AD, min A, 4/15:17.5 sec without AD, supervision    Time 12    Period Weeks    Status Partially Met    Target Date 04/27/20                 Plan - 04/19/20 5003    Clinical Impression Statement Patient motivated and participated well within session. She is progressing well with standing on uneven surfaces being able to exhibit improved static balance. She  does continue to have difficulty with dynamic movement. Attempted reaction strategies this session but patient unable due to imbalance/weakness; Patient does require min A with 1 HHA while walking in thick grass. This is an area that she would like to improve as she would like to be more independent when walking in her garden at home. patient would benefit from additional skilled PT Intervention to improve strength, balance and mobility;    Personal Factors and Comorbidities Comorbidity 2;Fitness;Past/Current Experience;Time since onset of injury/illness/exacerbation;Age    Comorbidities CP and anxiety;    Examination-Activity Limitations Caring for Others;Carry;Lift;Locomotion Level;Reach Overhead;Squat;Stairs;Stand;Transfers    Examination-Participation Restrictions Church;Cleaning;Community Activity;Driving;Laundry;Medication Management;Meal Prep;Shop;Volunteer;Yard Work    Stability/Clinical Decision Making Unstable/Unpredictable    Rehab Potential Good    PT Frequency 1x / week    PT Duration 12 weeks    PT Treatment/Interventions Patient/family education;Manual techniques;Therapeutic activities;Therapeutic exercise;Electrical Stimulation;Aquatic Therapy;DME Instruction;Functional mobility training;Gait training;Neuromuscular re-education;ADLs/Self Care Home Management;Cryotherapy;Moist Heat;Stair training;Orthotic Fit/Training;Energy conservation    PT Next Visit Plan assess/adjust HEP, walking program, strength and balance    PT Home Exercise Plan had HEP, will adjust next session;    Consulted and Agree with Plan of Care Patient           Patient will benefit from skilled therapeutic intervention in order to improve the following deficits and impairments:  Abnormal gait, Decreased balance, Decreased endurance, Decreased mobility, Difficulty walking, Cardiopulmonary status limiting activity, Decreased activity tolerance, Decreased safety awareness, Decreased strength  Visit  Diagnosis: Muscle weakness (generalized)  Unsteadiness on feet     Problem List Patient Active Problem List   Diagnosis Date Noted  . Loss of weight 07/17/2015  . Depression 07/17/2015  . Cerebral palsy ([place]) 07/17/2015    Arthurine Oleary PT, DPT 04/19/2020, 9:30 AM  [place] [facility] MAIN [facility] SERVICES [address] [place], [place], [address] Phone: [phone]   Fax:  [phone]  Name: Renee Walton MRN: [id number] Date of Birth: 1996/08/08

## 2020-04-26 ENCOUNTER — Ambulatory Visit: Payer: Medicaid Other | Admitting: Physical Therapy

## 2020-05-03 ENCOUNTER — Other Ambulatory Visit: Payer: Self-pay

## 2020-05-03 ENCOUNTER — Encounter: Payer: Self-pay | Admitting: Physical Therapy

## 2020-05-03 ENCOUNTER — Ambulatory Visit: Payer: Medicaid Other | Attending: Physical Medicine and Rehabilitation | Admitting: Physical Therapy

## 2020-05-03 DIAGNOSIS — R2681 Unsteadiness on feet: Secondary | ICD-10-CM

## 2020-05-03 DIAGNOSIS — M6281 Muscle weakness (generalized): Secondary | ICD-10-CM | POA: Diagnosis present

## 2020-05-03 NOTE — Therapy (Signed)
[place] MAIN [facility] SERVICES [address] [place], [place], [address] Phone: [phone]   Fax:  [phone]  Physical Therapy Treatment  Patient Details  Name: Renee Walton MRN: [id number] Date of Birth: 12-05-1995 Referring Provider (PT): [place], [place]   Encounter Date: 05/03/2020   PT End of Session - 05/03/20 1103    Visit Number 19    Number of Visits 37    Date for PT Re-Evaluation 07/26/20    Authorization Type medicaid    Authorization Time Period 12 visits approved, 6/7-8/29    Authorization - Visit Number 5    Authorization - Number of Visits 12    PT Start Time 5102    PT Stop Time 1145    PT Time Calculation (min) 43 min    Equipment Utilized During Treatment Gait belt    Activity Tolerance Patient tolerated treatment well;No increased pain    Behavior During Therapy WFL for tasks assessed/performed           Past Medical History:  Diagnosis Date  . Breakthrough bleeding   . Cerebral palsy ([place])   . GAD (generalized anxiety disorder)   . Migraine headache     Past Surgical History:  Procedure Laterality Date  . LEG SURGERY     bilateral x2 in 2008    There were no vitals filed for this visit.   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 Patient had outpatient PT from [place] to improve strength, balance and gait safety; She stopped in October since she was going to [place] [employer] for her cousin's wedding. She reports currently she is still walking without AD. She reports approximtely 2-3 falls each week. She reports her left knee started hurting after one of her falls. She has not followed up with her PCP at this time regarding knee pain; She reports adherence with HEP  when her mom is able to help, She also still has a home health aide (4 hours a week). The Aide is helping with HEP, assists with household chores, etc.    Limitations Standing;Walking    How long can you stand comfortably? 10 min    How long can you walk comfortably? 10-15 min, limited due to knee pain    Diagnostic tests none recent    Patient Stated Goals balance, improve safety and stability with walking.    Currently in Pain? No/denies    Multiple Pain Sites No              [facility] PT Assessment - 05/03/20 0001      Observation/Other Assessments   Focus on Therapeutic Outcomes (FOTO)  63%    Activities of Balance Confidence Scale (ABC Scale)  27.5%      6 Minute walk- Post Test   BP (mmHg) 146/96    HR (bpm) 139    02 Sat (%RA) 100 %      6 minute walk test results    Aerobic Endurance Distance Walked 1050    Endurance additional comments with close supervision      Berg Balance Test   Sit to Stand Able to stand without using hands and stabilize independently    Standing Unsupported Able to stand 2 minutes with supervision  Sitting with Back Unsupported but Feet Supported on Floor or Stool Able to sit safely and securely 2 minutes    Stand to Sit Sits safely with minimal use of hands    Transfers Able to transfer safely, minor use of hands    Standing Unsupported with Eyes Closed Able to stand 10 seconds safely    Standing Unsupported with Feet Together Able to place feet together independently and stand for 1 minute with supervision    From Standing, Reach Forward with Outstretched Arm Can reach forward >12 cm safely (5")    From Standing Position, Pick up Object from Floor Able to pick up shoe, needs supervision    From Standing Position, Turn to Look Behind Over each Shoulder Looks behind one side only/other side shows less weight shift    Turn 360 Degrees Needs close supervision or verbal cueing    Standing Unsupported, Alternately Place Feet on Step/Stool Needs  assistance to keep from falling or unable to try    Standing Unsupported, One Foot in Front Needs help to step but can hold 15 seconds    Standing on One Leg Unable to try or needs assist to prevent fall    Total Score 37    Berg comment: 80% risk for falls, improved from 34/56 on 02/03/20          TREATMENT: Warm up on [place]ustep BUE/BLE level 2 x4 min (Unbilled);  Pt instructed in outcome measures to address goals. Patient has made significant improvements in gait and balance but still exhibits need for additional skilled intervention to further reach goals;                         PT Education - 05/03/20 1102    Education provided Yes    Education Details LE strength, balance/gait safety; HEP    Person(s) Educated Patient    Methods Explanation;Verbal cues    Comprehension Verbalized understanding;Returned demonstration;Verbal cues required;Need further instruction            PT Short Term Goals - 05/03/20 1103      PT SHORT TERM GOAL #1   Title Patient will be adherent to HEP at least 3x a week to improve functional strength and balance for better safety at home.    Baseline has HEP, doing it 2-3 times per week dependening on help from Aide/mom; unable to go to gym due to [place], 12/13/19: 2 days 4/15: 2 days, 6/9: 1-2x a week, 7/14: 1-2x a week    Time 4    Period Weeks    Status Partially Met    Target Date 05/31/20      PT SHORT TERM GOAL #2   Title Patient will exhibit improved safety awareness in the home reporting a maximum of 1 fall in last week.    Baseline 11/23/19: falls 3+ times a week, 12/13/19: no fall this past week, 4/15: 1 fall in last week    Time 4    Period Weeks    Status Achieved    Target Date 12/21/19      PT SHORT TERM GOAL #3   Title Pt will improve BLE hip abductor strength to 3-/5 for improved gluteal activation to improve stance control    Baseline 11/23/19: 2/5, 12/13/19: 2/5, 4/15: 2/5, 6/9: 2/5 strength, 7/14: 2/5    Time 4      Period Weeks    Status Not Met    Target Date 12/21/19  PT Long Term Goals - 05/03/20 1104      PT LONG TERM GOAL #1   Title Patient will increase Berg Balance score by > 6 points to demonstrate decreased fall risk during functional activities.    Baseline 11/23/19: 24/56, 12/13/19: 26/56, 7/14: 37/56    Time 12    Period Weeks    Status Achieved    Target Date 07/26/20      PT LONG TERM GOAL #2   Title Patient will increase six minute walk test distance to >1000 feet without AD, supervision for progression to community ambulator and improve gait ability    Baseline 11/23/19: 650 feet with min A without AD, multiple stumbles, 12/13/19: 950 feet with min A, no AD, multiple stumbles, 4/15: 1085 feet, min A, 7/14: 1050 supervision    Time 12    Period Weeks    Status Achieved    Target Date 07/26/20      PT LONG TERM GOAL #3   Title Pt will improve BLE hip abductor strength to 3+/5 for better functional strength in standing and to improve stability and balance.    Baseline 11/23/19: 2/5 bilaterally; 12/13/19: 2/5 bilaterally, 4/15: 2/5, 6/9: 2/5 strength bilaterally; 7/14: 2/5    Time 12    Period Weeks    Status Not Met    Target Date 07/26/20      PT LONG TERM GOAL #4   Title Patient will be independent in bending down towards floor and picking up small object (<5 pounds) and then stand back up without loss of balance as to improve ability to pick up and clean up room at home    Baseline 11/23/19: able to bend down, but loses balance upon standing up; 12/13/19: no change, 4/15: supervision, 7/14: supervision    Time 12    Period Weeks    Status Partially Met    Target Date 07/26/20      PT LONG TERM GOAL #5   Title Patient will reduce timed up and go to <11 seconds to reduce fall risk and demonstrate improved transfer/gait ability.    Baseline 12/13/19: 16.5 sec without AD, min A, 4/15:17.5 sec without AD, supervision    Time 12    Period Weeks    Status Partially Met     Target Date 07/26/20      Additional Long Term Goals   Additional Long Term Goals Yes      PT LONG TERM GOAL #6   Title Patient will improve FOTO score to > 65% for improved functional mobility with ADLs.    Time 12    Period Weeks    Status New    Target Date 07/26/20                 Plan - 05/03/20 1527    Clinical Impression Statement Patient is progressing well. She was instructed in outcome measures to address progress. Patient does exhibit significant improvement in gait ability. She was able to complete 6 min walk test with close supervision without assistive device and exhibits improved dynamic gait safety. She also tested as a lower fall risk as evidenced by Merrilee Jansky Balance Assessment. patient continues to have weakness in BLE which does limit gait safety with increased trendelenburg gait. Patient would benefit from additional skilled PT intervention to improve strength, balance and gait safety. She would also benefit from additional skilled PT intervention to improve gait safety on uneven surfaces so that she can be more active in the  community;    Personal Factors and Comorbidities Comorbidity 2;Fitness;Past/Current Experience;Time since onset of injury/illness/exacerbation;Age    Comorbidities CP and anxiety;    Examination-Activity Limitations Caring for Others;Carry;Lift;Locomotion Level;Reach Overhead;Squat;Stairs;Stand;Transfers    Examination-Participation Restrictions Church;Cleaning;Community Activity;Driving;Laundry;Medication Management;Meal Prep;Shop;Volunteer;Yard Work    Stability/Clinical Decision Making Unstable/Unpredictable    Rehab Potential Good    PT Frequency 1x / week    PT Duration 12 weeks    PT Treatment/Interventions Patient/family education;Manual techniques;Therapeutic activities;Therapeutic exercise;Electrical Stimulation;Aquatic Therapy;DME Instruction;Functional mobility training;Gait training;Neuromuscular re-education;ADLs/Self Care Home  Management;Cryotherapy;Moist Heat;Stair training;Orthotic Fit/Training;Energy conservation    PT Next Visit Plan assess/adjust HEP, walking program, strength and balance    PT Home Exercise Plan had HEP, will adjust next session;    Consulted and Agree with Plan of Care Patient           Patient will benefit from skilled therapeutic intervention in order to improve the following deficits and impairments:  Abnormal gait, Decreased balance, Decreased endurance, Decreased mobility, Difficulty walking, Cardiopulmonary status limiting activity, Decreased activity tolerance, Decreased safety awareness, Decreased strength  Visit Diagnosis: Muscle weakness (generalized)  Unsteadiness on feet     Problem List Patient Active Problem List   Diagnosis Date Noted  . Loss of weight 07/17/2015  . Depression 07/17/2015  . Cerebral palsy ([place]) 07/17/2015    Aunesti Pellegrino PT, DPT 05/03/2020, 3:35 PM  [place] MAIN [facility] SERVICES [address] [place], [place], [address] Phone: [phone]   Fax:  [phone]  Name: Jarica Plass MRN: [id number] Date of Birth: Sep 12, 1996

## 2020-05-10 ENCOUNTER — Ambulatory Visit: Payer: Medicaid Other | Admitting: Physical Therapy

## 2020-05-17 ENCOUNTER — Other Ambulatory Visit: Payer: Self-pay

## 2020-05-17 ENCOUNTER — Ambulatory Visit: Payer: Medicaid Other | Admitting: Physical Therapy

## 2020-05-17 ENCOUNTER — Encounter: Payer: Self-pay | Admitting: Physical Therapy

## 2020-05-17 DIAGNOSIS — R2681 Unsteadiness on feet: Secondary | ICD-10-CM

## 2020-05-17 DIAGNOSIS — M6281 Muscle weakness (generalized): Secondary | ICD-10-CM | POA: Diagnosis not present

## 2020-05-17 NOTE — Therapy (Signed)
[place] MAIN [facility] SERVICES [address] [place], [place], [address] Phone: [phone]   Fax:  [phone]  Physical Therapy Treatment Physical Therapy Progress Note   Dates of reporting period  02/03/20  to   05/17/20   Patient Details  Name: Renee Walton MRN: [id number] Date of Birth: [birth year] Referring Provider (PT): Cordry [place], [place]   Encounter Date: 05/17/2020   PT End of Session - 05/17/20 1143    Visit Number 20    Number of Visits 37    Date for PT Re-Evaluation 07/26/20    Authorization Type medicaid    Authorization Time Period 12 visits approved, 6/7-8/29    Authorization - Visit Number 6    Authorization - Number of Visits 12    PT Start Time 6301    PT Stop Time 1230    PT Time Calculation (min) 44 min    Equipment Utilized During Treatment Gait belt    Activity Tolerance Patient tolerated treatment well;No increased pain    Behavior During Therapy WFL for tasks assessed/performed           Past Medical History:  Diagnosis Date  . Breakthrough bleeding   . Cerebral palsy ([place])   . GAD (generalized anxiety disorder)   . Migraine headache     Past Surgical History:  Procedure Laterality Date  . LEG SURGERY     bilateral x2 in 2008    There were no vitals filed for this visit.   Subjective Assessment - 05/17/20 1149    Subjective Patient reports doing well; She reports falling 2x last week. No injury. 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 Patient had outpatient PT from [facility] to improve strength, balance and gait safety; She stopped in October since she was going to [place] for her cousin's wedding. She reports currently she is still walking without AD. She reports approximtely 2-3 falls each week. She reports her left knee started hurting after one of her  falls. She has not followed up with her PCP at this time regarding knee pain; She reports adherence with HEP when her mom is able to help, She also still has a home health aide (4 hours a week). The Aide is helping with HEP, assists with household chores, etc.    Limitations Standing;Walking    How long can you stand comfortably? 10 min    How long can you walk comfortably? 10-15 min, limited due to knee pain    Diagnostic tests none recent    Patient Stated Goals balance, improve safety and stability with walking.    Currently in Pain? No/denies    Multiple Pain Sites No                TREATMENT: Gait around gym holding yellow dyna disc like a tray x50 feet CGA Progressed gait weaving around cones while holding dyna disc #6 cones x2 reps with min A for safety;  Instructed patient in holding small basket with BUE and walking to cones and placing basket on floor and picking up cones one at a time to put in basket to simulate picking up toys/clothes from floor #6 Then instructed patient in placing cones from basket back on floor #6 Patient required increased time for squatting down to floor often bending more at hips and less at knees as well  as requiring min A for safety;  Instructed patient in standing beside mat table/solid surface and squatting down to floor and then standing up x5 reps; advanced HEP with squatting exercise for increased LE strengthening  Reinforced HEP with instruction to increase activity level at home including to avoid sitting >1  Hour at a time;  Patient and caregiver expressed desire for patient to bulk up along waist for better fitting clothes as she has extremely small frame. When asked about nutrition, patient expressed that her anxiety often prevents her from eating well. Recommended pt continue working with counselor on this issue as nutrition is important for weight gain as well as fuel for activity;   Patient tolerated session well. Patient is  progressing with dynamic balance this session with improved ability to carrying objects in BUE while walking. She does continue to require min A-CGA with dual tasks and does exhibit increased toe out as compensation for hip abductor weakness with increased lateral trunk lean;   Patient's condition has the potential to improve in response to therapy. Maximum improvement is yet to be obtained. The anticipated improvement is attainable and reasonable in a generally predictable time.  Patient reports  Adherence with HEP and is wanting to improve independence in her home. Goals were recently updated on 05/03/20, see last progress note;                      PT Education - 05/17/20 1143    Education provided Yes    Education Details LE strength, balance/gait safety, HEP    Person(s) Educated Patient    Methods Explanation;Verbal cues    Comprehension Verbalized understanding;Returned demonstration;Verbal cues required;Need further instruction            PT Short Term Goals - 05/03/20 1103      PT SHORT TERM GOAL #1   Title Patient will be adherent to HEP at least 3x a week to improve functional strength and balance for better safety at home.    Baseline has HEP, doing it 2-3 times per week dependening on help from Aide/mom; unable to go to gym due to [place], 12/13/19: 2 days 4/15: 2 days, 6/9: 1-2x a week, 7/14: 1-2x a week    Time 4    Period Weeks    Status Partially Met    Target Date 05/31/20      PT SHORT TERM GOAL #2   Title Patient will exhibit improved safety awareness in the home reporting a maximum of 1 fall in last week.    Baseline 11/23/19: falls 3+ times a week, 12/13/19: no fall this past week, 4/15: 1 fall in last week    Time 4    Period Weeks    Status Achieved    Target Date 12/21/19      PT SHORT TERM GOAL #3   Title Pt will improve BLE hip abductor strength to 3-/5 for improved gluteal activation to improve stance control    Baseline 11/23/19: 2/5, 12/13/19:  2/5, 4/15: 2/5, 6/9: 2/5 strength, 7/14: 2/5    Time 4    Period Weeks    Status Not Met    Target Date 12/21/19             PT Long Term Goals - 05/03/20 1104      PT LONG TERM GOAL #1   Title Patient will increase Berg Balance score by > 6 points to demonstrate decreased fall risk during functional activities.  Baseline 11/23/19: 24/56, 12/13/19: 26/56, 7/14: 37/56    Time 12    Period Weeks    Status Achieved    Target Date 07/26/20      PT LONG TERM GOAL #2   Title Patient will increase six minute walk test distance to >1000 feet without AD, supervision for progression to community ambulator and improve gait ability    Baseline 11/23/19: 650 feet with min A without AD, multiple stumbles, 12/13/19: 950 feet with min A, no AD, multiple stumbles, 4/15: 1085 feet, min A, 7/14: 1050 supervision    Time 12    Period Weeks    Status Achieved    Target Date 07/26/20      PT LONG TERM GOAL #3   Title Pt will improve BLE hip abductor strength to 3+/5 for better functional strength in standing and to improve stability and balance.    Baseline 11/23/19: 2/5 bilaterally; 12/13/19: 2/5 bilaterally, 4/15: 2/5, 6/9: 2/5 strength bilaterally; 7/14: 2/5    Time 12    Period Weeks    Status Not Met    Target Date 07/26/20      PT LONG TERM GOAL #4   Title Patient will be independent in bending down towards floor and picking up small object (<5 pounds) and then stand back up without loss of balance as to improve ability to pick up and clean up room at home    Baseline 11/23/19: able to bend down, but loses balance upon standing up; 12/13/19: no change, 4/15: supervision, 7/14: supervision    Time 12    Period Weeks    Status Partially Met    Target Date 07/26/20      PT LONG TERM GOAL #5   Title Patient will reduce timed up and go to <11 seconds to reduce fall risk and demonstrate improved transfer/gait ability.    Baseline 12/13/19: 16.5 sec without AD, min A, 4/15:17.5 sec without AD,  supervision    Time 12    Period Weeks    Status Partially Met    Target Date 07/26/20      Additional Long Term Goals   Additional Long Term Goals Yes      PT LONG TERM GOAL #6   Title Patient will improve FOTO score to > 65% for improved functional mobility with ADLs.    Time 12    Period Weeks    Status New    Target Date 07/26/20                 Plan - 05/18/20 5361    Clinical Impression Statement Patient motivated and participated well within session. She expressed desire to improve balance/strength to be able to increase independence at home. Instructed patient in various activities that challenged balance/gait safety while carrying items/picking things up, turning etc. Patient does require CGA for most dynamic balance tasks with occasional min A for balance recovery. She continues to have difficulty with stopping mid gait and avoiding forward loss of balance. Patient also exhibits increased lateral trunk sway due to increased weakness in gluteal/hip during gait tasks. She would benefit from additional skilled PT Intervention to improve strength, balance and mobility;    Personal Factors and Comorbidities Comorbidity 2;Fitness;Past/Current Experience;Time since onset of injury/illness/exacerbation;Age    Comorbidities CP and anxiety;    Examination-Activity Limitations Caring for Others;Carry;Lift;Locomotion Level;Reach Overhead;Squat;Stairs;Stand;Transfers    Examination-Participation Restrictions Church;Cleaning;Community Activity;Driving;Laundry;Medication Management;Meal Prep;Shop;Volunteer;Yard Work    Stability/Clinical Decision Making Unstable/Unpredictable    Rehab Potential Good  PT Frequency 1x / week    PT Duration 12 weeks    PT Treatment/Interventions Patient/family education;Manual techniques;Therapeutic activities;Therapeutic exercise;Electrical Stimulation;Aquatic Therapy;DME Instruction;Functional mobility training;Gait training;Neuromuscular  re-education;ADLs/Self Care Home Management;Cryotherapy;Moist Heat;Stair training;Orthotic Fit/Training;Energy conservation    PT Next Visit Plan assess/adjust HEP, walking program, strength and balance    PT Home Exercise Plan had HEP, will adjust next session;    Consulted and Agree with Plan of Care Patient           Patient will benefit from skilled therapeutic intervention in order to improve the following deficits and impairments:  Abnormal gait, Decreased balance, Decreased endurance, Decreased mobility, Difficulty walking, Cardiopulmonary status limiting activity, Decreased activity tolerance, Decreased safety awareness, Decreased strength  Visit Diagnosis: Muscle weakness (generalized)  Unsteadiness on feet     Problem List Patient Active Problem List   Diagnosis Date Noted  . Loss of weight 07/17/2015  . Depression 07/17/2015  . Cerebral palsy ([place]) 07/17/2015    Adelbert Gaspard PT, DPT 05/18/2020, 8:21 AM  [place] MAIN [facility] SERVICES [address] [place], [place], [address] Phone: [phone]   Fax:  [phone]  Name: Renee Walton MRN: [id number] Date of Birth: June 21, 1996

## 2020-05-24 ENCOUNTER — Encounter: Payer: Self-pay | Admitting: Physical Therapy

## 2020-05-24 ENCOUNTER — Ambulatory Visit: Payer: Medicaid Other | Attending: Physical Medicine and Rehabilitation | Admitting: Physical Therapy

## 2020-05-24 ENCOUNTER — Other Ambulatory Visit: Payer: Self-pay

## 2020-05-24 DIAGNOSIS — R2681 Unsteadiness on feet: Secondary | ICD-10-CM | POA: Diagnosis present

## 2020-05-24 DIAGNOSIS — G809 Cerebral palsy, unspecified: Secondary | ICD-10-CM | POA: Diagnosis present

## 2020-05-24 DIAGNOSIS — M6281 Muscle weakness (generalized): Secondary | ICD-10-CM | POA: Diagnosis not present

## 2020-05-24 NOTE — Therapy (Signed)
[place] MAIN [facility] SERVICES [address] [place], [place], [address] Phone: [phone]   Fax:  [phone]  Physical Therapy Treatment  Patient Details  Name: Renee Walton MRN: [id number] Date of Birth: [birth year] Referring Provider (PT): [place], [place]   Encounter Date: 05/24/2020   PT End of Session - 05/24/20 1052    Visit Number 21    Number of Visits 37    Date for PT Re-Evaluation 07/26/20    Authorization Type medicaid    Authorization Time Period 12 visits approved, 6/7-8/29    Authorization - Visit Number 7    Authorization - Number of Visits 12    PT Start Time 1055    PT Stop Time 1140    PT Time Calculation (min) 45 min    Equipment Utilized During Treatment Gait belt    Activity Tolerance Patient tolerated treatment well;No increased pain    Behavior During Therapy WFL for tasks assessed/performed           Past Medical History:  Diagnosis Date  . Breakthrough bleeding   . Cerebral palsy ([place])   . GAD (generalized anxiety disorder)   . Migraine headache     Past Surgical History:  Procedure Laterality Date  . LEG SURGERY     bilateral x2 in 2008    There were no vitals filed for this visit.   Subjective Assessment - 05/24/20 1057    Subjective Patient reports going to the gym yesterday. She also reports she has been doing more activities at home. She reports mild soreness in RLE as a result of exercise.    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 Patient had outpatient PT from [facility] to improve strength, balance and gait safety; She stopped in October since she was going to [place] for her cousin's wedding. She reports currently she is still walking without AD. She reports approximtely 2-3 falls each week. She reports her left knee started hurting after one of her falls. She has not followed up with her PCP at this time  regarding knee pain; She reports adherence with HEP when her mom is able to help, She also still has a home health aide (4 hours a week). The Aide is helping with HEP, assists with household chores, etc.    Limitations Standing;Walking    How long can you stand comfortably? 10 min    How long can you walk comfortably? 10-15 min, limited due to knee pain    Diagnostic tests none recent    Patient Stated Goals balance, improve safety and stability with walking.    Currently in Pain? Yes    Pain Score 2     Pain Location Leg    Pain Orientation Right;Upper    Pain Descriptors / Indicators Aching;Sore    Pain Type Acute pain    Pain Onset Yesterday    Pain Frequency Intermittent    Aggravating Factors  worse with movement    Pain Relieving Factors rest    Effect of Pain on Daily Activities decreased activity tolerance;    Multiple Pain Sites No                   TREATMENT: Patient ambulated on level surface x500 feet x2 sets with CGA for safety and cues for weight shift and to improve step length for better foot clearance;  Progressed gait training to walking outside on  unlevel surface x100 feet on grass while holding small soccer ball with min-mod A to avoid loss of balance;  x150 feet on concrete with ball pass side/side with min A for safety x15 feet side stepping on grass with mod A for balance with increased lateral loss of balance and difficulty stepping with abduction often ER due to weakness;  When walking on grass patient required min A for gait safety with increased lateral trunk lean and instability due to unstable surface; She was able to walk on concrete with CGA with reciprocal gait pattern, however required increased assistance when passing ball, increased toe out with increased lateral trunk lean due to hip abductor weakness. Patient does tend to walk quickly often losing balance forward.   She does require min A when changing surfaces due to  instability;  Patient instructed in dynamic balance: Resisted walking 7.5# forward/backward x2 laps with CGA for safety, Patient does require min VCs for weight shift for better dynamic balance.   Gait in gym carrying cone with small ball in single UE to simulate carrying a cup x30 feet x2 sets with min A for safety;   Patient tolerated session well. Patient is progressing with dynamic balance this session with improvedability to carrying objects in BUE while walking. She does continue to require min A-CGA with dual tasks and does exhibit increased toe out as compensation for hip abductor weakness with increased lateral trunk lean;She required min-mod A for balance control while walking on thick grass. She had significant difficulty with side stepping due to hip abductor weakness; She denies any increase in pain at end of session but does report increased fatigue;                     PT Education - 05/24/20 1052    Education provided Yes    Education Details LE strength, balance/gait safety, HEP    Person(s) Educated Patient    Methods Explanation;Verbal cues    Comprehension Verbalized understanding;Returned demonstration;Verbal cues required;Need further instruction            PT Short Term Goals - 05/03/20 1103      PT SHORT TERM GOAL #1   Title Patient will be adherent to HEP at least 3x a week to improve functional strength and balance for better safety at home.    Baseline has HEP, doing it 2-3 times per week dependening on help from Aide/mom; unable to go to gym due to [place], 12/13/19: 2 days 4/15: 2 days, 6/9: 1-2x a week, 7/14: 1-2x a week    Time 4    Period Weeks    Status Partially Met    Target Date 05/31/20      PT SHORT TERM GOAL #2   Title Patient will exhibit improved safety awareness in the home reporting a maximum of 1 fall in last week.    Baseline 11/23/19: falls 3+ times a week, 12/13/19: no fall this past week, 4/15: 1 fall in last week     Time 4    Period Weeks    Status Achieved    Target Date 12/21/19      PT SHORT TERM GOAL #3   Title Pt will improve BLE hip abductor strength to 3-/5 for improved gluteal activation to improve stance control    Baseline 11/23/19: 2/5, 12/13/19: 2/5, 4/15: 2/5, 6/9: 2/5 strength, 7/14: 2/5    Time 4    Period Weeks    Status Not Met  Target Date 12/21/19             PT Long Term Goals - 05/03/20 1104      PT LONG TERM GOAL #1   Title Patient will increase Berg Balance score by > 6 points to demonstrate decreased fall risk during functional activities.    Baseline 11/23/19: 24/56, 12/13/19: 26/56, 7/14: 37/56    Time 12    Period Weeks    Status Achieved    Target Date 07/26/20      PT LONG TERM GOAL #2   Title Patient will increase six minute walk test distance to >1000 feet without AD, supervision for progression to community ambulator and improve gait ability    Baseline 11/23/19: 650 feet with min A without AD, multiple stumbles, 12/13/19: 950 feet with min A, no AD, multiple stumbles, 4/15: 1085 feet, min A, 7/14: 1050 supervision    Time 12    Period Weeks    Status Achieved    Target Date 07/26/20      PT LONG TERM GOAL #3   Title Pt will improve BLE hip abductor strength to 3+/5 for better functional strength in standing and to improve stability and balance.    Baseline 11/23/19: 2/5 bilaterally; 12/13/19: 2/5 bilaterally, 4/15: 2/5, 6/9: 2/5 strength bilaterally; 7/14: 2/5    Time 12    Period Weeks    Status Not Met    Target Date 07/26/20      PT LONG TERM GOAL #4   Title Patient will be independent in bending down towards floor and picking up small object (<5 pounds) and then stand back up without loss of balance as to improve ability to pick up and clean up room at home    Baseline 11/23/19: able to bend down, but loses balance upon standing up; 12/13/19: no change, 4/15: supervision, 7/14: supervision    Time 12    Period Weeks    Status Partially Met    Target Date  07/26/20      PT LONG TERM GOAL #5   Title Patient will reduce timed up and go to <11 seconds to reduce fall risk and demonstrate improved transfer/gait ability.    Baseline 12/13/19: 16.5 sec without AD, min A, 4/15:17.5 sec without AD, supervision    Time 12    Period Weeks    Status Partially Met    Target Date 07/26/20      Additional Long Term Goals   Additional Long Term Goals Yes      PT LONG TERM GOAL #6   Title Patient will improve FOTO score to > 65% for improved functional mobility with ADLs.    Time 12    Period Weeks    Status New    Target Date 07/26/20                 Plan - 05/24/20 1333    Clinical Impression Statement Patient motivated and participated well within session. She had significant difficulty walking on grass requiring min-mod A for safety. Patient also had difficulty with resisted walking. She continues to have increased lateral trunk lean when walking. She had significant difficulty on unlevel surfaces. Patient also had difficulty when carrying objects reducing dynamic balance with less reaching out with UE. She would benefit from additional skilled PT Intervention to improve strength, balance and mobility;    Personal Factors and Comorbidities Comorbidity 2;Fitness;Past/Current Experience;Time since onset of injury/illness/exacerbation;Age    Comorbidities CP and anxiety;  Examination-Activity Limitations Caring for Others;Carry;Lift;Locomotion Level;Reach Overhead;Squat;Stairs;Stand;Transfers    Examination-Participation Restrictions Church;Cleaning;Community Activity;Driving;Laundry;Medication Management;Meal Prep;Shop;Volunteer;Yard Work    Stability/Clinical Decision Making Unstable/Unpredictable    Rehab Potential Good    PT Frequency 1x / week    PT Duration 12 weeks    PT Treatment/Interventions Patient/family education;Manual techniques;Therapeutic activities;Therapeutic exercise;Electrical Stimulation;Aquatic Therapy;DME  Instruction;Functional mobility training;Gait training;Neuromuscular re-education;ADLs/Self Care Home Management;Cryotherapy;Moist Heat;Stair training;Orthotic Fit/Training;Energy conservation    PT Next Visit Plan assess/adjust HEP, walking program, strength and balance    PT Home Exercise Plan had HEP, will adjust next session;    Consulted and Agree with Plan of Care Patient           Patient will benefit from skilled therapeutic intervention in order to improve the following deficits and impairments:  Abnormal gait, Decreased balance, Decreased endurance, Decreased mobility, Difficulty walking, Cardiopulmonary status limiting activity, Decreased activity tolerance, Decreased safety awareness, Decreased strength  Visit Diagnosis: Muscle weakness (generalized)  Unsteadiness on feet     Problem List Patient Active Problem List   Diagnosis Date Noted  . Loss of weight 07/17/2015  . Depression 07/17/2015  . Cerebral palsy ([place]) 07/17/2015    Debera Sterba PT, DPT 05/25/2020, 8:14 AM  [place] MAIN [facility] SERVICES [address] [place], [place], [address] Phone: [phone]   Fax:  [phone]  Name: Renee Walton MRN: [id number] Date of Birth: Jan 30, 1996

## 2020-05-31 ENCOUNTER — Ambulatory Visit: Payer: Medicaid Other | Admitting: Physical Therapy

## 2020-05-31 ENCOUNTER — Encounter: Payer: Self-pay | Admitting: Physical Therapy

## 2020-05-31 ENCOUNTER — Other Ambulatory Visit: Payer: Self-pay

## 2020-05-31 DIAGNOSIS — G809 Cerebral palsy, unspecified: Secondary | ICD-10-CM

## 2020-05-31 DIAGNOSIS — M6281 Muscle weakness (generalized): Secondary | ICD-10-CM | POA: Diagnosis not present

## 2020-05-31 DIAGNOSIS — R2681 Unsteadiness on feet: Secondary | ICD-10-CM

## 2020-05-31 NOTE — Therapy (Signed)
[place] MAIN [facility] SERVICES [address] [place], [place], [address] Phone: [phone]   Fax:  [phone]  Physical Therapy Treatment  Patient Details  Name: Renee Walton MRN: [id number] Date of Birth: [birth year] Referring Provider (PT): [place], [place]   Encounter Date: 05/31/2020   PT End of Session - 05/31/20 1109    Visit Number 22    Number of Visits 37    Date for PT Re-Evaluation 07/26/20    Authorization Type medicaid    Authorization Time Period 12 visits approved, 6/7-8/29    Authorization - Visit Number 8    Authorization - Number of Visits 12    PT Start Time 8127    PT Stop Time 5170    PT Time Calculation (min) 43 min    Equipment Utilized During Treatment Gait belt    Activity Tolerance Patient tolerated treatment well;No increased pain    Behavior During Therapy WFL for tasks assessed/performed           Past Medical History:  Diagnosis Date  . Breakthrough bleeding   . Cerebral palsy ([place])   . GAD (generalized anxiety disorder)   . Migraine headache     Past Surgical History:  Procedure Laterality Date  . LEG SURGERY     bilateral x2 in 2008    There were no vitals filed for this visit.   Subjective Assessment - 05/31/20 1107    Subjective Patient reports doing some stretches and exercises yesterday. 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 Patient had outpatient PT from [place] to improve strength, balance and gait safety; She stopped in October since she was going to [place] for her cousin's wedding. She reports currently she is still walking without AD. She reports approximtely 2-3 falls each week. She reports her left knee started hurting after one of her falls. She has not followed up with her PCP at this time regarding knee pain; She reports adherence with HEP when  her mom is able to help, She also still has a home health aide (4 hours a week). The Aide is helping with HEP, assists with household chores, etc.    Limitations Standing;Walking    How long can you stand comfortably? 10 min    How long can you walk comfortably? 10-15 min, limited due to knee pain    Diagnostic tests none recent    Patient Stated Goals balance, improve safety and stability with walking.    Currently in Pain? Yes    Pain Score 3     Pain Location Leg    Pain Orientation Left    Pain Descriptors / Indicators Aching;Sore    Pain Type Acute pain    Pain Onset Yesterday    Pain Frequency Intermittent    Aggravating Factors  worse with movement    Pain Relieving Factors rest    Effect of Pain on Daily Activities decreased activity tolerance;                      TREATMENT:   Patient instructed in dynamic balance: Resisted walking 7.5# forward/backward x2 laps with min A for safety, Patient does require min VCs for weight shift for better dynamic balance. Resisted walking side stepping 2.5# x1 lap with mod A and cues for proper positioning and weight shift; Patient had significant difficulty often  compensating with hip ER due to weakness in hip abduction;   Gait in gym carrying cone with small ball in single UE to simulate carrying a cup x30 feet x2 sets with min A for safety; Working on sitting in chair and standing back up with cone with ball to simulate carrying a cup to the table and standing up x2 reps with min A for safety;   Ascend/descend 4 steps with B rail assist, forward non-reciprocal with increased scissoring noted especially with RLE  Weaving around cones #5 x4 set with min A for safety; Patient had increased difficulty with narrow cones requiring increased turning.   Exercise:  Seated figure 4 stretch 30 sec hold x1 rep each; Attempted hip abduction/ER in seated but patient unable;  Reinforced HEP with instruction to increase side stepping  at home to challenge hip abductor strengthening;     Patient tolerated session well. She does exhibit increased scissoring this session especially with RLE. She required min-mod A to avoid loss of balance. Patient does have difficulty with side stepping requiring cues for proper foot placement for better hip abductor strengthening. Patient denies any increase in pain at end of session;               PT Education - 05/31/20 1109    Education provided Yes    Education Details LE strength, balance/gait safety, HEP    Person(s) Educated Patient    Methods Explanation;Verbal cues    Comprehension Verbalized understanding;Returned demonstration;Verbal cues required;Need further instruction            PT Short Term Goals - 05/03/20 1103      PT SHORT TERM GOAL #1   Title Patient will be adherent to HEP at least 3x a week to improve functional strength and balance for better safety at home.    Baseline has HEP, doing it 2-3 times per week dependening on help from Aide/mom; unable to go to gym due to [place], 12/13/19: 2 days 4/15: 2 days, 6/9: 1-2x a week, 7/14: 1-2x a week    Time 4    Period Weeks    Status Partially Met    Target Date 05/31/20      PT SHORT TERM GOAL #2   Title Patient will exhibit improved safety awareness in the home reporting a maximum of 1 fall in last week.    Baseline 11/23/19: falls 3+ times a week, 12/13/19: no fall this past week, 4/15: 1 fall in last week    Time 4    Period Weeks    Status Achieved    Target Date 12/21/19      PT SHORT TERM GOAL #3   Title Pt will improve BLE hip abductor strength to 3-/5 for improved gluteal activation to improve stance control    Baseline 11/23/19: 2/5, 12/13/19: 2/5, 4/15: 2/5, 6/9: 2/5 strength, 7/14: 2/5    Time 4    Period Weeks    Status Not Met    Target Date 12/21/19             PT Long Term Goals - 05/03/20 1104      PT LONG TERM GOAL #1   Title Patient will increase Berg Balance score by > 6  points to demonstrate decreased fall risk during functional activities.    Baseline 11/23/19: 24/56, 12/13/19: 26/56, 7/14: 37/56    Time 12    Period Weeks    Status Achieved    Target Date 07/26/20  PT LONG TERM GOAL #2   Title Patient will increase six minute walk test distance to >1000 feet without AD, supervision for progression to community ambulator and improve gait ability    Baseline 11/23/19: 650 feet with min A without AD, multiple stumbles, 12/13/19: 950 feet with min A, no AD, multiple stumbles, 4/15: 1085 feet, min A, 7/14: 1050 supervision    Time 12    Period Weeks    Status Achieved    Target Date 07/26/20      PT LONG TERM GOAL #3   Title Pt will improve BLE hip abductor strength to 3+/5 for better functional strength in standing and to improve stability and balance.    Baseline 11/23/19: 2/5 bilaterally; 12/13/19: 2/5 bilaterally, 4/15: 2/5, 6/9: 2/5 strength bilaterally; 7/14: 2/5    Time 12    Period Weeks    Status Not Met    Target Date 07/26/20      PT LONG TERM GOAL #4   Title Patient will be independent in bending down towards floor and picking up small object (<5 pounds) and then stand back up without loss of balance as to improve ability to pick up and clean up room at home    Baseline 11/23/19: able to bend down, but loses balance upon standing up; 12/13/19: no change, 4/15: supervision, 7/14: supervision    Time 12    Period Weeks    Status Partially Met    Target Date 07/26/20      PT LONG TERM GOAL #5   Title Patient will reduce timed up and go to <11 seconds to reduce fall risk and demonstrate improved transfer/gait ability.    Baseline 12/13/19: 16.5 sec without AD, min A, 4/15:17.5 sec without AD, supervision    Time 12    Period Weeks    Status Partially Met    Target Date 07/26/20      Additional Long Term Goals   Additional Long Term Goals Yes      PT LONG TERM GOAL #6   Title Patient will improve FOTO score to > 65% for improved functional  mobility with ADLs.    Time 12    Period Weeks    Status New    Target Date 07/26/20                 Plan - 05/31/20 1323    Clinical Impression Statement Patient motivated and participated well within session. Patient instructed in advanced balance exercise. Patient does exhibit increased scissoring this session. Instructed patient in advanced hip abductor activation. She did have difficulty with side stepping often turning feet out into hip ER to avoid using hip abductor due to weakness. Patient instructed to increase side stepping activities at home. Patient requires min A with most balance activities with occasional mod A to avoid loss of balance at times. She would benefit from additional skilled PT intervention to improve strength, balance and mobility;    Personal Factors and Comorbidities Comorbidity 2;Fitness;Past/Current Experience;Time since onset of injury/illness/exacerbation;Age    Comorbidities CP and anxiety;    Examination-Activity Limitations Caring for Others;Carry;Lift;Locomotion Level;Reach Overhead;Squat;Stairs;Stand;Transfers    Examination-Participation Restrictions Church;Cleaning;Community Activity;Driving;Laundry;Medication Management;Meal Prep;Shop;Volunteer;Yard Work    Stability/Clinical Decision Making Unstable/Unpredictable    Rehab Potential Good    PT Frequency 1x / week    PT Duration 12 weeks    PT Treatment/Interventions Patient/family education;Manual techniques;Therapeutic activities;Therapeutic exercise;Electrical Stimulation;Aquatic Therapy;DME Instruction;Functional mobility training;Gait training;Neuromuscular re-education;ADLs/Self Care Home Management;Cryotherapy;Moist Heat;Stair training;Orthotic Fit/Training;Energy conservation  PT Next Visit Plan assess/adjust HEP, walking program, strength and balance    PT Home Exercise Plan had HEP, will adjust next session;    Consulted and Agree with Plan of Care Patient           Patient will  benefit from skilled therapeutic intervention in order to improve the following deficits and impairments:  Abnormal gait, Decreased balance, Decreased endurance, Decreased mobility, Difficulty walking, Cardiopulmonary status limiting activity, Decreased activity tolerance, Decreased safety awareness, Decreased strength  Visit Diagnosis: Muscle weakness (generalized)  Unsteadiness on feet  Cerebral palsy, unspecified type [facility])     Problem List Patient Active Problem List   Diagnosis Date Noted  . Loss of weight 07/17/2015  . Depression 07/17/2015  . Cerebral palsy ([place]) 07/17/2015    Renee Walton PT, DPT 05/31/2020, 1:29 PM  [place] MAIN [facility] SERVICES [address] [place], [place], [address] Phone: [phone]   Fax:  [phone]  Name: Renee Walton MRN: [id number] Date of Birth: 28-Feb-1996

## 2020-06-07 ENCOUNTER — Encounter: Payer: Self-pay | Admitting: Physical Therapy

## 2020-06-07 ENCOUNTER — Other Ambulatory Visit: Payer: Self-pay

## 2020-06-07 ENCOUNTER — Ambulatory Visit: Payer: Medicaid Other | Admitting: Physical Therapy

## 2020-06-07 DIAGNOSIS — R2681 Unsteadiness on feet: Secondary | ICD-10-CM

## 2020-06-07 DIAGNOSIS — M6281 Muscle weakness (generalized): Secondary | ICD-10-CM | POA: Diagnosis not present

## 2020-06-07 NOTE — Therapy (Signed)
[place] MAIN [facility] SERVICES [address] [place], [place], [address] Phone: [phone]   Fax:  [phone]  Physical Therapy Treatment  Patient Details  Name: Renee Walton MRN: [id number] Date of Birth: 1996-06-01 Referring Provider (PT): [place], [place]   Encounter Date: 06/07/2020   PT End of Session - 06/07/20 1044    Visit Number 23    Number of Visits 37    Date for PT Re-Evaluation 07/26/20    Authorization Type medicaid    Authorization Time Period 12 visits approved, 6/7-8/29    Authorization - Visit Number 9    Authorization - Number of Visits 12    PT Start Time 1033    PT Stop Time 1115    PT Time Calculation (min) 42 min    Equipment Utilized During Treatment Gait belt    Activity Tolerance Patient tolerated treatment well;No increased pain    Behavior During Therapy WFL for tasks assessed/performed           Past Medical History:  Diagnosis Date  . Breakthrough bleeding   . Cerebral palsy ([place])   . GAD (generalized anxiety disorder)   . Migraine headache     Past Surgical History:  Procedure Laterality Date  . LEG SURGERY     bilateral x2 in 2008    There were no vitals filed for this visit.   Subjective Assessment - 06/07/20 1045    Subjective Patient reports increased swelling in BLE feet this morning. She denies any pain. Reports 2 falls in the last week. 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 Patient had outpatient PT from [place] to improve strength, balance and gait safety; She stopped in October since she was going to [place] for her cousin's wedding. She reports currently she is still walking without AD. She reports approximtely 2-3 falls each week. She reports her left knee started hurting after one of her falls. She has not followed up with her PCP at this time regarding  knee pain; She reports adherence with HEP when her mom is able to help, She also still has a home health aide (4 hours a week). The Aide is helping with HEP, assists with household chores, etc.    Limitations Standing;Walking    How long can you stand comfortably? 10 min    How long can you walk comfortably? 10-15 min, limited due to knee pain    Diagnostic tests none recent    Patient Stated Goals balance, improve safety and stability with walking.    Currently in Pain? No/denies    Pain Onset Yesterday    Multiple Pain Sites No                 TREATMENT: Patient presents to therapy reporting increased swelling in BLE; PT inspected BLE with no edema noted and minimal swelling; Recommended patient reach out to PCP if swelling continues;  Warm up on Treadmill 1.5 mph -1.8 mph with 2 HHA x5 min with cues to increase erect posture, reduce trunk lean, improve step length and increase DF at heel strike; patient exhibit increased foot drag on RLE with increased hip ER and decreased hip/knee flexion during swing with mild hip circumduction; She also exhibits heavy trunk lean due to poor gluteal activation and decreased hip/core stabilization in mid stance. vitals after gait,[id number],  Instructed patient in balance exercise:  Patient instructed in dynamic balance: Resisted walking 7.5# forward/backward x2 laps with min A for safety, Patient does require min VCs for weight shift for better dynamic balance.  Gait in gym on level surface with intermittent random stops, holding balance for 20 sec unsupported x200 feet with 5 stops; Initially patient exhibits posterior loss of balance but then was able to keep balance with quick stop with close supervision;   Weaving around cones #5 x2 set with CGA for safety; Patient had increased difficulty with narrow cones requiring increased turning.   On airex pad:             Feet apart                         Unsupported standing x1 min  with intermittent min A for safety;   BUE ball pass side/side x10 reps with min A for safety with intermittent posterior loss of balance;   Mini squat placing cones and picking up cones from floor to facilitate increased knee flexion x10 reps;  Patient required min A for most balance exercise, and cues for weight shift for better stance control; Pt exhibits increased posterior lean requiring cues to shift forward for less loss of balance;    Patient tolerated session well. She was instructed in advanced balance this session. She did have some difficulty with static standing especially on compliant surfaces. She also continues to have difficulty with dynamic balance weaving around cones and with resisted walking. She required min A throughout session. She would benefit from additional skilled PT intervention to improve strength, balance and mobility;                     PT Education - 06/07/20 1045    Education provided Yes    Education Details balance/gait safety, HEP    Person(s) Educated Patient    Methods Explanation;Verbal cues    Comprehension Verbalized understanding;Returned demonstration;Verbal cues required;Need further instruction            PT Short Term Goals - 05/03/20 1103      PT SHORT TERM GOAL #1   Title Patient will be adherent to HEP at least 3x a week to improve functional strength and balance for better safety at home.    Baseline has HEP, doing it 2-3 times per week dependening on help from Aide/mom; unable to go to gym due to [place], 12/13/19: 2 days 4/15: 2 days, 6/9: 1-2x a week, 7/14: 1-2x a week    Time 4    Period Weeks    Status Partially Met    Target Date 05/31/20      PT SHORT TERM GOAL #2   Title Patient will exhibit improved safety awareness in the home reporting a maximum of 1 fall in last week.    Baseline 11/23/19: falls 3+ times a week, 12/13/19: no fall this past week, 4/15: 1 fall in last week    Time 4    Period Weeks    Status  Achieved    Target Date 12/21/19      PT SHORT TERM GOAL #3   Title Pt will improve BLE hip abductor strength to 3-/5 for improved gluteal activation to improve stance control    Baseline 11/23/19: 2/5, 12/13/19: 2/5, 4/15: 2/5, 6/9: 2/5 strength, 7/14: 2/5    Time 4    Period Weeks    Status Not Met    Target Date 12/21/19  PT Long Term Goals - 05/03/20 1104      PT LONG TERM GOAL #1   Title Patient will increase Berg Balance score by > 6 points to demonstrate decreased fall risk during functional activities.    Baseline 11/23/19: 24/56, 12/13/19: 26/56, 7/14: 37/56    Time 12    Period Weeks    Status Achieved    Target Date 07/26/20      PT LONG TERM GOAL #2   Title Patient will increase six minute walk test distance to >1000 feet without AD, supervision for progression to community ambulator and improve gait ability    Baseline 11/23/19: 650 feet with min A without AD, multiple stumbles, 12/13/19: 950 feet with min A, no AD, multiple stumbles, 4/15: 1085 feet, min A, 7/14: 1050 supervision    Time 12    Period Weeks    Status Achieved    Target Date 07/26/20      PT LONG TERM GOAL #3   Title Pt will improve BLE hip abductor strength to 3+/5 for better functional strength in standing and to improve stability and balance.    Baseline 11/23/19: 2/5 bilaterally; 12/13/19: 2/5 bilaterally, 4/15: 2/5, 6/9: 2/5 strength bilaterally; 7/14: 2/5    Time 12    Period Weeks    Status Not Met    Target Date 07/26/20      PT LONG TERM GOAL #4   Title Patient will be independent in bending down towards floor and picking up small object (<5 pounds) and then stand back up without loss of balance as to improve ability to pick up and clean up room at home    Baseline 11/23/19: able to bend down, but loses balance upon standing up; 12/13/19: no change, 4/15: supervision, 7/14: supervision    Time 12    Period Weeks    Status Partially Met    Target Date 07/26/20      PT LONG TERM GOAL  #5   Title Patient will reduce timed up and go to <11 seconds to reduce fall risk and demonstrate improved transfer/gait ability.    Baseline 12/13/19: 16.5 sec without AD, min A, 4/15:17.5 sec without AD, supervision    Time 12    Period Weeks    Status Partially Met    Target Date 07/26/20      Additional Long Term Goals   Additional Long Term Goals Yes      PT LONG TERM GOAL #6   Title Patient will improve FOTO score to > 65% for improved functional mobility with ADLs.    Time 12    Period Weeks    Status New    Target Date 07/26/20                 Plan - 06/07/20 1425    Clinical Impression Statement Patient motivated and participated well within session; She was instructed in advanced balance tasks. Patient exhibits increased posterior lean/loss of balance with prolonged standing unsupported especially while on compliant surfaces. Patient instructed in advanced dynamic balance tasks. She was able to progress with quick stops with good balance recovery. However she continues to have difficulty with turning weaving around cones requiring min A for safety. She would benefit from additional skilled PT intervention to improve strength, balance and mobility;    Personal Factors and Comorbidities Comorbidity 2;Fitness;Past/Current Experience;Time since onset of injury/illness/exacerbation;Age    Comorbidities CP and anxiety;    Examination-Activity Limitations Caring for Others;Carry;Lift;Locomotion Level;Reach Overhead;Squat;Stairs;Stand;Transfers  Examination-Participation Restrictions Church;Cleaning;Community Activity;Driving;Laundry;Medication Management;Meal Prep;Shop;Volunteer;Yard Work    Stability/Clinical Decision Making Unstable/Unpredictable    Rehab Potential Good    PT Frequency 1x / week    PT Duration 12 weeks    PT Treatment/Interventions Patient/family education;Manual techniques;Therapeutic activities;Therapeutic exercise;Electrical Stimulation;Aquatic  Therapy;DME Instruction;Functional mobility training;Gait training;Neuromuscular re-education;ADLs/Self Care Home Management;Cryotherapy;Moist Heat;Stair training;Orthotic Fit/Training;Energy conservation    PT Next Visit Plan assess/adjust HEP, walking program, strength and balance    PT Home Exercise Plan had HEP, will adjust next session;    Consulted and Agree with Plan of Care Patient           Patient will benefit from skilled therapeutic intervention in order to improve the following deficits and impairments:  Abnormal gait, Decreased balance, Decreased endurance, Decreased mobility, Difficulty walking, Cardiopulmonary status limiting activity, Decreased activity tolerance, Decreased safety awareness, Decreased strength  Visit Diagnosis: Muscle weakness (generalized)  Unsteadiness on feet     Problem List Patient Active Problem List   Diagnosis Date Noted  . Loss of weight 07/17/2015  . Depression 07/17/2015  . Cerebral palsy ([place]) 07/17/2015    Renee Walton PT, DPT 06/07/2020, 3:08 PM  [place] MAIN [facility] SERVICES [address] [place], [place], [address] Phone: [phone]   Fax:  [phone]  Name: Renee Walton MRN: [id number] Date of Birth: [birth year]

## 2020-06-14 ENCOUNTER — Other Ambulatory Visit: Payer: Self-pay

## 2020-06-14 ENCOUNTER — Encounter: Payer: Self-pay | Admitting: Physical Therapy

## 2020-06-14 ENCOUNTER — Ambulatory Visit: Payer: Medicaid Other | Admitting: Physical Therapy

## 2020-06-14 DIAGNOSIS — M6281 Muscle weakness (generalized): Secondary | ICD-10-CM

## 2020-06-14 DIAGNOSIS — R2681 Unsteadiness on feet: Secondary | ICD-10-CM

## 2020-06-14 NOTE — Therapy (Signed)
[place] MAIN [facility] SERVICES [address] [place], [place], [address] Phone: [phone]   Fax:  [phone]  Physical Therapy Treatment  Patient Details  Name: Renee Walton MRN: [id number] Date of Birth: [birth year] Referring Provider (PT): [place], [place]   Encounter Date: 06/14/2020   PT End of Session - 06/14/20 1102    Visit Number 24    Number of Visits 37    Date for PT Re-Evaluation 07/26/20    Authorization Type medicaid    Authorization Time Period 12 visits approved, 6/7-8/29    Authorization - Visit Number 10    Authorization - Number of Visits 12    PT Start Time 1101    PT Stop Time 1145    PT Time Calculation (min) 44 min    Equipment Utilized During Treatment Gait belt    Activity Tolerance Patient tolerated treatment well;No increased pain    Behavior During Therapy WFL for tasks assessed/performed           Past Medical History:  Diagnosis Date  . Breakthrough bleeding   . Cerebral palsy ([place])   . GAD (generalized anxiety disorder)   . Migraine headache     Past Surgical History:  Procedure Laterality Date  . LEG SURGERY     bilateral x2 in 2008    There were no vitals filed for this visit.   Subjective Assessment - 06/14/20 1105    Subjective Patient reports her foot is doing better. She hasn't had as much swelling. She reports going for a walk yesterday with her walker. 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 Patient had outpatient PT from [place] to improve strength, balance and gait safety; She stopped in October since she was going to [place] for her cousin's wedding. She reports currently she is still walking without AD. She reports approximtely 2-3 falls each week. She reports her left knee started hurting after one of her falls. She has not followed up with her PCP at this time  regarding knee pain; She reports adherence with HEP when her mom is able to help, She also still has a home health aide (4 hours a week). The Aide is helping with HEP, assists with household chores, etc.    Limitations Standing;Walking    How long can you stand comfortably? 10 min    How long can you walk comfortably? 10-15 min, limited due to knee pain    Diagnostic tests none recent    Patient Stated Goals balance, improve safety and stability with walking.    Currently in Pain? No/denies    Pain Onset Yesterday    Multiple Pain Sites No              [facility] PT Assessment - 06/14/20 0001      Observation/Other Assessments   Focus on Therapeutic Outcomes (FOTO)  68%    Activities of Balance Confidence Scale (ABC Scale)  36.9%      Strength   Right/Left Hip Right;Left    Right Hip Flexion 3-/5    Right Hip Extension 3-/5    Right Hip ABduction 2/5    Right Hip ADduction 3-/5    Left Hip Flexion 4-/5    Left Hip Extension 2/5    Left Hip ABduction 2/5    Left Hip ADduction 3+/5    Right Knee Flexion 3-/5  Right Knee Extension 4+/5    Left Knee Flexion 4+/5    Left Knee Extension 4+/5      Standardized Balance Assessment   Standardized Balance Assessment Five Times Sit to Stand    Five times sit to stand comments  11.75 sec with arms across chest      Berg Balance Test   Sit to Stand Able to stand without using hands and stabilize independently    Standing Unsupported Able to stand 2 minutes with supervision    Sitting with Back Unsupported but Feet Supported on Floor or Stool Able to sit safely and securely 2 minutes    Stand to Sit Sits safely with minimal use of hands    Transfers Able to transfer safely, definite need of hands    Standing Unsupported with Eyes Closed Able to stand 10 seconds safely    Standing Unsupported with Feet Together Needs help to attain position but able to stand for 30 seconds with feet together    From Standing, Reach Forward with  Outstretched Arm Can reach forward >12 cm safely (5")    From Standing Position, Pick up Object from Floor Able to pick up shoe, needs supervision    From Standing Position, Turn to Look Behind Over each Shoulder Looks behind one side only/other side shows less weight shift    Turn 360 Degrees Needs assistance while turning    Standing Unsupported, Alternately Place Feet on Step/Stool Able to complete >2 steps/needs minimal assist    Standing Unsupported, One Foot in Front Needs help to step but can hold 15 seconds    Standing on One Leg Unable to try or needs assist to prevent fall    Total Score 34    Berg comment: 100% risk for falls, more impaired than 05/03/20 which was 37/56      Timed Up and Go Test   Normal TUG (seconds) 19.68    TUG Comments without AD, supervision for safety; more impaired than last reassessment on 02/03/20 which was 17.5 sec             TREATMENT: Instructed patient in timed up and go, [employer] assessment and other outcome measures to address goals, see above;  Patient continues to have significant weakness in BLE and exhibits slight impairment in balance today compared to last month. Reinforced importance of HEP adherence. Instructed patient to increase frequency of exercise to 3x a week for better strengthening;                       PT Education - 06/14/20 1102    Education provided Yes    Education Details progress towards goals, HEP    Person(s) Educated Patient    Methods Explanation;Verbal cues    Comprehension Verbalized understanding;Returned demonstration;Verbal cues required;Need further instruction            PT Short Term Goals - 06/14/20 1103      PT SHORT TERM GOAL #1   Title Patient will be adherent to HEP at least 3x a week to improve functional strength and balance for better safety at home.    Baseline has HEP, doing it 2-3 times per week dependening on help from Aide/mom; unable to go to gym due to [place],  12/13/19: 2 days 4/15: 2 days, 6/9: 1-2x a week, 7/14: 1-2x a week, 8/25: 2 days a week;    Time 4    Period Weeks    Status Partially Met  Target Date 05/31/20      PT SHORT TERM GOAL #2   Title Patient will exhibit improved safety awareness in the home reporting a maximum of 1 fall in last week.    Baseline 11/23/19: falls 3+ times a week, 12/13/19: no fall this past week, 4/15: 1 fall in last week    Time 4    Period Weeks    Status Achieved    Target Date 12/21/19      PT SHORT TERM GOAL #3   Title Pt will improve BLE hip abductor strength to 3-/5 for improved gluteal activation to improve stance control    Baseline 11/23/19: 2/5, 12/13/19: 2/5, 4/15: 2/5, 6/9: 2/5 strength, 7/14: 2/5, 8/25: 2/5    Time 4    Period Weeks    Status Not Met    Target Date 12/21/19             PT Long Term Goals - 06/14/20 1103      PT LONG TERM GOAL #1   Title Patient will increase Berg Balance score by > 6 points to demonstrate decreased fall risk during functional activities.    Baseline 11/23/19: 24/56, 12/13/19: 26/56, 7/14: 37/56, 8/25: 34/56    Time 12    Period Weeks    Status Achieved    Target Date 07/26/20      PT LONG TERM GOAL #2   Title Patient will increase six minute walk test distance to >1000 feet without AD, supervision for progression to community ambulator and improve gait ability    Baseline 11/23/19: 650 feet with min A without AD, multiple stumbles, 12/13/19: 950 feet with min A, no AD, multiple stumbles, 4/15: 1085 feet, min A, 7/14: 1050 supervision    Time 12    Period Weeks    Status Achieved    Target Date 07/26/20      PT LONG TERM GOAL #3   Title Pt will improve BLE hip abductor strength to 3+/5 for better functional strength in standing and to improve stability and balance.    Baseline 11/23/19: 2/5 bilaterally; 12/13/19: 2/5 bilaterally, 4/15: 2/5, 6/9: 2/5 strength bilaterally; 7/14: 2/5, 8/25: 2/5    Time 12    Period Weeks    Status Not Met    Target Date  07/26/20      PT LONG TERM GOAL #4   Title Patient will be independent in bending down towards floor and picking up small object (<5 pounds) and then stand back up without loss of balance as to improve ability to pick up and clean up room at home    Baseline 11/23/19: able to bend down, but loses balance upon standing up; 12/13/19: no change, 4/15: supervision, 7/14: supervision, 8/25: supervision    Time 12    Period Weeks    Status Partially Met    Target Date 07/26/20      PT LONG TERM GOAL #5   Title Patient will reduce timed up and go to <11 seconds to reduce fall risk and demonstrate improved transfer/gait ability.    Baseline 12/13/19: 16.5 sec without AD, min A, 4/15:17.5 sec without AD, supervision, 8/25: 19.68 without AD supervision    Time 12    Period Weeks    Status Partially Met    Target Date 07/26/20      PT LONG TERM GOAL #6   Title Patient will improve FOTO score to > 65% for improved functional mobility with ADLs.    Baseline 8/25: 68%  Time 12    Period Weeks    Status Achieved    Target Date 07/26/20                 Plan - 06/14/20 1301    Clinical Impression Statement Patient motivated and participated well within session. She was instructed in outcome measures to address progress towards goals. She continues to have weakness in BLE significantly in hip abduction and extension. Patient also exhibits slight decline in balance compared to last month. Although overall exhibits improvement in balance. Patient overall exhibits improvement in balance in that she is able to walk on level surfaces mostly supervision level without AD. She reports using her walker when walking outside by herself for increased gait safety. She self reports significant improvement in functional mobility as evidenced by FOTO score. Patient would benefit from additional skilled PT Intervention to improve LE strength and mobility.    Personal Factors and Comorbidities Comorbidity  2;Fitness;Past/Current Experience;Time since onset of injury/illness/exacerbation;Age    Comorbidities CP and anxiety;    Examination-Activity Limitations Caring for Others;Carry;Lift;Locomotion Level;Reach Overhead;Squat;Stairs;Stand;Transfers    Examination-Participation Restrictions Church;Cleaning;Community Activity;Driving;Laundry;Medication Management;Meal Prep;Shop;Volunteer;Yard Work    Stability/Clinical Decision Making Unstable/Unpredictable    Rehab Potential Good    PT Frequency 1x / week    PT Duration 12 weeks    PT Treatment/Interventions Patient/family education;Manual techniques;Therapeutic activities;Therapeutic exercise;Electrical Stimulation;Aquatic Therapy;DME Instruction;Functional mobility training;Gait training;Neuromuscular re-education;ADLs/Self Care Home Management;Cryotherapy;Moist Heat;Stair training;Orthotic Fit/Training;Energy conservation    PT Next Visit Plan assess/adjust HEP, walking program, strength and balance    PT Home Exercise Plan had HEP, will adjust next session;    Consulted and Agree with Plan of Care Patient           Patient will benefit from skilled therapeutic intervention in order to improve the following deficits and impairments:  Abnormal gait, Decreased balance, Decreased endurance, Decreased mobility, Difficulty walking, Cardiopulmonary status limiting activity, Decreased activity tolerance, Decreased safety awareness, Decreased strength  Visit Diagnosis: Muscle weakness (generalized)  Unsteadiness on feet     Problem List Patient Active Problem List   Diagnosis Date Noted  . Loss of weight 07/17/2015  . Depression 07/17/2015  . Cerebral palsy ([place]) 07/17/2015    Sacha Topor PT, DPT 06/14/2020, 1:07 PM  [place] MAIN [facility] SERVICES [address] [place], [place], [address] Phone: [phone]   Fax:  [phone]  Name: Renee Walton MRN: [id number] Date of Birth:  [birth year]

## 2020-06-21 ENCOUNTER — Encounter: Payer: Self-pay | Admitting: Physical Therapy

## 2020-06-21 ENCOUNTER — Other Ambulatory Visit: Payer: Self-pay

## 2020-06-21 ENCOUNTER — Ambulatory Visit: Payer: Medicaid Other | Attending: Physical Medicine and Rehabilitation | Admitting: Physical Therapy

## 2020-06-21 DIAGNOSIS — M6281 Muscle weakness (generalized): Secondary | ICD-10-CM | POA: Insufficient documentation

## 2020-06-21 DIAGNOSIS — R2681 Unsteadiness on feet: Secondary | ICD-10-CM | POA: Insufficient documentation

## 2020-06-21 NOTE — Therapy (Signed)
[place] MAIN [facility] SERVICES [address] [place], [place], [address] Phone: [phone]   Fax:  [phone]  Physical Therapy Treatment  Patient Details  Name: Renee Walton MRN: [id number] Date of Birth: 1996-08-25 Referring Provider (PT): [place], [place]   Encounter Date: 06/21/2020   PT End of Session - 06/21/20 1111    Visit Number 25    Number of Visits 37    Date for PT Re-Evaluation 07/26/20    Authorization Type medicaid    Authorization Time Period 4 visits approved, 06/21/20-07/18/20    Authorization - Visit Number 1    Authorization - Number of Visits 4    PT Start Time 1102    PT Stop Time 5929    PT Time Calculation (min) 43 min    Equipment Utilized During Treatment Gait belt    Activity Tolerance Patient tolerated treatment well;No increased pain    Behavior During Therapy WFL for tasks assessed/performed           Past Medical History:  Diagnosis Date  . Breakthrough bleeding   . Cerebral palsy ([place])   . GAD (generalized anxiety disorder)   . Migraine headache     Past Surgical History:  Procedure Laterality Date  . LEG SURGERY     bilateral x2 in 2008    There were no vitals filed for this visit.   Subjective Assessment - 06/21/20 1110    Subjective Patient reports doing well; She reports less swelling and denies any new falls; Reports she is working on exercises but admits sometimes its hard.    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 Patient had outpatient PT from [place] to improve strength, balance and gait safety; She stopped in October since she was going to [place] for her cousin's wedding. She reports currently she is still walking without AD. She reports approximtely 2-3 falls each week. She reports her left knee started hurting after one of her falls. She has not followed up with her PCP at this time regarding knee  pain; She reports adherence with HEP when her mom is able to help, She also still has a home health aide (4 hours a week). The Aide is helping with HEP, assists with household chores, etc.    Limitations Standing;Walking    How long can you stand comfortably? 10 min    How long can you walk comfortably? 10-15 min, limited due to knee pain    Diagnostic tests none recent    Patient Stated Goals balance, improve safety and stability with walking.    Currently in Pain? No/denies    Pain Onset Yesterday    Multiple Pain Sites No                 TREATMENT:  Patient instructed in dynamic balance: Resisted walking 7.5# forward/backward x2 laps withmin Afor safety, Patient does require min VCs for weight shift for better dynamic balance.  Gait in gym on level surface with intermittent random stops, holding balance for 20 sec unsupported x200 feet with 5 stops; Initially patient exhibits posterior loss of balance but then was able to keep balance with quick stop with close supervision;   4 square stepping clockwise x4 reps; with min A for safety and mod VCs to increase step length; Patient has significant difficulty taking large step forward/backward due to increased hip adductor tone and weakness;   Standing at step:  On airex pad: Standing one foot on airex, one foot on 6 inch step:   Unsupported standing 15 sec hold x2 reps;   Unsupported standing BUE ball up/down x10 reps;  Heel raises with and without rail x10 reps; patient exhibits decreased ROM without rail assist;   Squat down to floor with cues for increased knee flexion x10 reps, unsupported standing with min A for safety;   Patient required min A for most balance exercise, and cues for weight shift for better stance control;Pt exhibits increased posterior lean requiring cues to shift forward for less loss of balance;                     PT Education - 06/21/20 1111    Education provided  Yes    Education Details LE strengthening, gait safety, HEP    Person(s) Educated Patient    Methods Explanation;Verbal cues    Comprehension Verbalized understanding;Returned demonstration;Verbal cues required;Need further instruction            PT Short Term Goals - 06/14/20 1103      PT SHORT TERM GOAL #1   Title Patient will be adherent to HEP at least 3x a week to improve functional strength and balance for better safety at home.    Baseline has HEP, doing it 2-3 times per week dependening on help from Aide/mom; unable to go to gym due to [place], 12/13/19: 2 days 4/15: 2 days, 6/9: 1-2x a week, 7/14: 1-2x a week, 8/25: 2 days a week;    Time 4    Period Weeks    Status Partially Met    Target Date 05/31/20      PT SHORT TERM GOAL #2   Title Patient will exhibit improved safety awareness in the home reporting a maximum of 1 fall in last week.    Baseline 11/23/19: falls 3+ times a week, 12/13/19: no fall this past week, 4/15: 1 fall in last week    Time 4    Period Weeks    Status Achieved    Target Date 12/21/19      PT SHORT TERM GOAL #3   Title Pt will improve BLE hip abductor strength to 3-/5 for improved gluteal activation to improve stance control    Baseline 11/23/19: 2/5, 12/13/19: 2/5, 4/15: 2/5, 6/9: 2/5 strength, 7/14: 2/5, 8/25: 2/5    Time 4    Period Weeks    Status Not Met    Target Date 12/21/19             PT Long Term Goals - 06/14/20 1103      PT LONG TERM GOAL #1   Title Patient will increase Berg Balance score by > 6 points to demonstrate decreased fall risk during functional activities.    Baseline 11/23/19: 24/56, 12/13/19: 26/56, 7/14: 37/56, 8/25: 34/56    Time 12    Period Weeks    Status Achieved    Target Date 07/26/20      PT LONG TERM GOAL #2   Title Patient will increase six minute walk test distance to >1000 feet without AD, supervision for progression to community ambulator and improve gait ability    Baseline 11/23/19: 650 feet with min A  without AD, multiple stumbles, 12/13/19: 950 feet with min A, no AD, multiple stumbles, 4/15: 1085 feet, min A, 7/14: 1050 supervision    Time 12    Period Weeks    Status Achieved    Target  Date 07/26/20      PT LONG TERM GOAL #3   Title Pt will improve BLE hip abductor strength to 3+/5 for better functional strength in standing and to improve stability and balance.    Baseline 11/23/19: 2/5 bilaterally; 12/13/19: 2/5 bilaterally, 4/15: 2/5, 6/9: 2/5 strength bilaterally; 7/14: 2/5, 8/25: 2/5    Time 12    Period Weeks    Status Not Met    Target Date 07/26/20      PT LONG TERM GOAL #4   Title Patient will be independent in bending down towards floor and picking up small object (<5 pounds) and then stand back up without loss of balance as to improve ability to pick up and clean up room at home    Baseline 11/23/19: able to bend down, but loses balance upon standing up; 12/13/19: no change, 4/15: supervision, 7/14: supervision, 8/25: supervision    Time 12    Period Weeks    Status Partially Met    Target Date 07/26/20      PT LONG TERM GOAL #5   Title Patient will reduce timed up and go to <11 seconds to reduce fall risk and demonstrate improved transfer/gait ability.    Baseline 12/13/19: 16.5 sec without AD, min A, 4/15:17.5 sec without AD, supervision, 8/25: 19.68 without AD supervision    Time 12    Period Weeks    Status Partially Met    Target Date 07/26/20      PT LONG TERM GOAL #6   Title Patient will improve FOTO score to > 65% for improved functional mobility with ADLs.    Baseline 8/25: 68%    Time 12    Period Weeks    Status Achieved    Target Date 07/26/20                 Plan - 06/21/20 1317    Clinical Impression Statement Patient motivated and participated well within session. She was instructed in advanced balance exercise. patient does require min A for most activities with cues for weight shift for neutral stance and balance recovery. She continues to  utilize upper trunk for compensatory movement for balance recovery with poor hip abductor stabilization. Patient would benefit from additional skilled PT Intervention to improve strength, balance and mobility;    Personal Factors and Comorbidities Comorbidity 2;Fitness;Past/Current Experience;Time since onset of injury/illness/exacerbation;Age    Comorbidities CP and anxiety;    Examination-Activity Limitations Caring for Others;Carry;Lift;Locomotion Level;Reach Overhead;Squat;Stairs;Stand;Transfers    Examination-Participation Restrictions Church;Cleaning;Community Activity;Driving;Laundry;Medication Management;Meal Prep;Shop;Volunteer;Yard Work    Stability/Clinical Decision Making Unstable/Unpredictable    Rehab Potential Good    PT Frequency 1x / week    PT Duration 12 weeks    PT Treatment/Interventions Patient/family education;Manual techniques;Therapeutic activities;Therapeutic exercise;Electrical Stimulation;Aquatic Therapy;DME Instruction;Functional mobility training;Gait training;Neuromuscular re-education;ADLs/Self Care Home Management;Cryotherapy;Moist Heat;Stair training;Orthotic Fit/Training;Energy conservation    PT Next Visit Plan assess/adjust HEP, walking program, strength and balance    PT Home Exercise Plan had HEP, will adjust next session;    Consulted and Agree with Plan of Care Patient           Patient will benefit from skilled therapeutic intervention in order to improve the following deficits and impairments:  Abnormal gait, Decreased balance, Decreased endurance, Decreased mobility, Difficulty walking, Cardiopulmonary status limiting activity, Decreased activity tolerance, Decreased safety awareness, Decreased strength  Visit Diagnosis: Muscle weakness (generalized)  Unsteadiness on feet     Problem List Patient Active Problem List   Diagnosis Date Noted  .  Loss of weight 07/17/2015  . Depression 07/17/2015  . Cerebral palsy ([place]) 07/17/2015     Takasha Vetere  PT, DPT 06/21/2020, 1:20 PM  [place] MAIN [facility] SERVICES [address] [place], [place], [address] Phone: [phone]   Fax:  [phone]  Name: Taje Tondreau MRN: [id number] Date of Birth: 02/26/1996

## 2020-06-28 ENCOUNTER — Ambulatory Visit: Payer: Medicaid Other | Admitting: Physical Therapy

## 2020-07-05 ENCOUNTER — Ambulatory Visit: Payer: Medicaid Other | Admitting: Physical Therapy

## 2020-07-05 ENCOUNTER — Ambulatory Visit: Payer: Self-pay | Admitting: Dermatology

## 2020-07-05 ENCOUNTER — Other Ambulatory Visit: Payer: Self-pay

## 2020-07-05 ENCOUNTER — Encounter: Payer: Self-pay | Admitting: Physical Therapy

## 2020-07-05 DIAGNOSIS — M6281 Muscle weakness (generalized): Secondary | ICD-10-CM

## 2020-07-05 DIAGNOSIS — R2681 Unsteadiness on feet: Secondary | ICD-10-CM

## 2020-07-05 NOTE — Therapy (Signed)
[place] MAIN [facility] SERVICES [address] [place], [place], [address] Phone: [phone]   Fax:  [phone]  Physical Therapy Treatment  Patient Details  Name: Renee Walton MRN: [id number] Date of Birth: [birth year] Referring Provider (PT): [place], [place]   Encounter Date: 07/05/2020   PT End of Session - 07/05/20 1128    Visit Number 26    Number of Visits 37    Date for PT Re-Evaluation 07/26/20    Authorization Type medicaid    Authorization Time Period 4 visits approved, 06/21/20-07/18/20    Authorization - Visit Number 2    Authorization - Number of Visits 4    PT Start Time 0240    PT Stop Time 1210    PT Time Calculation (min) 45 min    Equipment Utilized During Treatment Gait belt    Activity Tolerance Patient tolerated treatment well;No increased pain    Behavior During Therapy WFL for tasks assessed/performed           Past Medical History:  Diagnosis Date  . Breakthrough bleeding   . Cerebral palsy ([place])   . GAD (generalized anxiety disorder)   . Migraine headache     Past Surgical History:  Procedure Laterality Date  . LEG SURGERY     bilateral x2 in 2008    There were no vitals filed for this visit.   Subjective Assessment - 07/05/20 1127    Subjective Patient reports doing fair. She reports her balance is okay, she has had some wobbles this week but no falls. 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 Patient had outpatient PT from [facility] to improve strength, balance and gait safety; She stopped in October since she was going to [place] for her cousin's wedding. She reports currently she is still walking without AD. She reports approximtely 2-3 falls each week. She reports her left knee started hurting after one of her falls. She has not followed up with her PCP at this  time regarding knee pain; She reports adherence with HEP when her mom is able to help, She also still has a home health aide (4 hours a week). The Aide is helping with HEP, assists with household chores, etc.    Limitations Standing;Walking    How long can you stand comfortably? 10 min    How long can you walk comfortably? 10-15 min, limited due to knee pain    Diagnostic tests none recent    Patient Stated Goals balance, improve safety and stability with walking.    Currently in Pain? Yes    Pain Score 2     Pain Location Leg    Pain Orientation Anterior    Pain Descriptors / Indicators Aching;Sore    Pain Type Acute pain    Pain Onset 1 to 4 weeks ago    Pain Frequency Intermittent    Aggravating Factors  worse with movement    Pain Relieving Factors rest    Effect of Pain on Daily Activities decreased activity tolerance;                 TREATMENT: Warm up on treadmill 1.3 mph with 2 HHA x4 min with cues for increased erect posture, increased step length and improve ankle DF; patient exhibits increased foot drag with increased toe out (R>L) due to poor gluteal contraction with increased  lateral trunk sway due to hip abductor weakness;   Seated figure 4 stretch 30 sec hold x2 reps with mod VCs for proper positioning; Seated hip abduction/ER AAROM with patient using UE to assist in overpressure for increased ROM;  Seated hip abduction/ER yellow tband x10 reps;  Side stepping with yellow tband tied around BLE knee x8 feet x5 laps each direction  Forward step ups and down on 4 inch step unsupported with min-mod A for balance x10 reps leading with LLE with mod VCs for proper foot placement including to get foot flat on step and to keep legs apart for less hip adductor tone;   NMR: Standing on airex: -squat down to floor and then standing up unsupported with min A for balance x10 reps; -feet apart, side/side weight shift x10 reps with min A for hips in neutral position to  facilitate better gluteal activation; Patient has significant difficulty often leading with UE with poor gluteal activation;   4 square stepping clockwise x5 reps; with min A for safety and mod VCs to increase step length; Patient has significant difficulty taking large step forward/backward due to increased hip adductor tone and weakness;   Gait around gym x100 feet with intermittent stopping unsupported. Patient able to walk in open area with close supervision and stop without loss of balance. She does require close supervision for safety and will reach out occasional to try and maintain balance.  Patient required min-mod A for most balance exercise, and cues for weight shift for better stance control;Pt exhibits increased lateral loss of balance with poor gluteal control due to weakness. Patient reinforced in HEP; PT texted past HEP instructions to patient for better adherence;                         PT Education - 07/05/20 1128    Education provided Yes    Education Details LE strengthening, gait sfaety, HEP    Person(s) Educated Patient    Methods Explanation;Verbal cues    Comprehension Verbalized understanding;Returned demonstration;Verbal cues required;Need further instruction            PT Short Term Goals - 06/14/20 1103      PT SHORT TERM GOAL #1   Title Patient will be adherent to HEP at least 3x a week to improve functional strength and balance for better safety at home.    Baseline has HEP, doing it 2-3 times per week dependening on help from Aide/mom; unable to go to gym due to [place], 12/13/19: 2 days 4/15: 2 days, 6/9: 1-2x a week, 7/14: 1-2x a week, 8/25: 2 days a week;    Time 4    Period Weeks    Status Partially Met    Target Date 05/31/20      PT SHORT TERM GOAL #2   Title Patient will exhibit improved safety awareness in the home reporting a maximum of 1 fall in last week.    Baseline 11/23/19: falls 3+ times a week, 12/13/19: no fall this  past week, 4/15: 1 fall in last week    Time 4    Period Weeks    Status Achieved    Target Date 12/21/19      PT SHORT TERM GOAL #3   Title Pt will improve BLE hip abductor strength to 3-/5 for improved gluteal activation to improve stance control    Baseline 11/23/19: 2/5, 12/13/19: 2/5, 4/15: 2/5, 6/9: 2/5 strength, 7/14: 2/5, 8/25: 2/5  Time 4    Period Weeks    Status Not Met    Target Date 12/21/19             PT Long Term Goals - 06/14/20 1103      PT LONG TERM GOAL #1   Title Patient will increase Berg Balance score by > 6 points to demonstrate decreased fall risk during functional activities.    Baseline 11/23/19: 24/56, 12/13/19: 26/56, 7/14: 37/56, 8/25: 34/56    Time 12    Period Weeks    Status Achieved    Target Date 07/26/20      PT LONG TERM GOAL #2   Title Patient will increase six minute walk test distance to >1000 feet without AD, supervision for progression to community ambulator and improve gait ability    Baseline 11/23/19: 650 feet with min A without AD, multiple stumbles, 12/13/19: 950 feet with min A, no AD, multiple stumbles, 4/15: 1085 feet, min A, 7/14: 1050 supervision    Time 12    Period Weeks    Status Achieved    Target Date 07/26/20      PT LONG TERM GOAL #3   Title Pt will improve BLE hip abductor strength to 3+/5 for better functional strength in standing and to improve stability and balance.    Baseline 11/23/19: 2/5 bilaterally; 12/13/19: 2/5 bilaterally, 4/15: 2/5, 6/9: 2/5 strength bilaterally; 7/14: 2/5, 8/25: 2/5    Time 12    Period Weeks    Status Not Met    Target Date 07/26/20      PT LONG TERM GOAL #4   Title Patient will be independent in bending down towards floor and picking up small object (<5 pounds) and then stand back up without loss of balance as to improve ability to pick up and clean up room at home    Baseline 11/23/19: able to bend down, but loses balance upon standing up; 12/13/19: no change, 4/15: supervision, 7/14:  supervision, 8/25: supervision    Time 12    Period Weeks    Status Partially Met    Target Date 07/26/20      PT LONG TERM GOAL #5   Title Patient will reduce timed up and go to <11 seconds to reduce fall risk and demonstrate improved transfer/gait ability.    Baseline 12/13/19: 16.5 sec without AD, min A, 4/15:17.5 sec without AD, supervision, 8/25: 19.68 without AD supervision    Time 12    Period Weeks    Status Partially Met    Target Date 07/26/20      PT LONG TERM GOAL #6   Title Patient will improve FOTO score to > 65% for improved functional mobility with ADLs.    Baseline 8/25: 68%    Time 12    Period Weeks    Status Achieved    Target Date 07/26/20                 Plan - 07/05/20 1310    Clinical Impression Statement Patient motivated and participated well within session. Reinforced HEP with instruction in LE stretches and gluteal strengthening. Patient does require min-mod VCs for proper positioning exercise technique. She does require min-mod A with most standing exercise especially when unsupported. She exhibits increased upper trunk accessory motion with movement due to poor gluteal activation and hip control. Patient would benefit from additional skilled PT Intervention to improve strength, balance and mobility;    Personal Factors and Comorbidities Comorbidity 2;Fitness;Past/Current Experience;Time  since onset of injury/illness/exacerbation;Age    Comorbidities CP and anxiety;    Examination-Activity Limitations Caring for Others;Carry;Lift;Locomotion Level;Reach Overhead;Squat;Stairs;Stand;Transfers    Examination-Participation Restrictions Church;Cleaning;Community Activity;Driving;Laundry;Medication Management;Meal Prep;Shop;Volunteer;Yard Work    Stability/Clinical Decision Making Unstable/Unpredictable    Rehab Potential Good    PT Frequency 1x / week    PT Duration 12 weeks    PT Treatment/Interventions Patient/family education;Manual  techniques;Therapeutic activities;Therapeutic exercise;Electrical Stimulation;Aquatic Therapy;DME Instruction;Functional mobility training;Gait training;Neuromuscular re-education;ADLs/Self Care Home Management;Cryotherapy;Moist Heat;Stair training;Orthotic Fit/Training;Energy conservation    PT Next Visit Plan assess/adjust HEP, walking program, strength and balance    PT Home Exercise Plan had HEP, will adjust next session;    Consulted and Agree with Plan of Care Patient           Patient will benefit from skilled therapeutic intervention in order to improve the following deficits and impairments:  Abnormal gait, Decreased balance, Decreased endurance, Decreased mobility, Difficulty walking, Cardiopulmonary status limiting activity, Decreased activity tolerance, Decreased safety awareness, Decreased strength  Visit Diagnosis: Muscle weakness (generalized)  Unsteadiness on feet     Problem List Patient Active Problem List   Diagnosis Date Noted  . Loss of weight 07/17/2015  . Depression 07/17/2015  . Cerebral palsy ([place]) 07/17/2015    Akylah Hascall PT, DPT 07/05/2020, 1:37 PM  [place] MAIN [facility] SERVICES [address] [place], [place], [address] Phone: [phone]   Fax:  [phone]  Name: Renee Walton MRN: [id number] Date of Birth: [birth year]

## 2020-07-12 ENCOUNTER — Other Ambulatory Visit: Payer: Self-pay

## 2020-07-12 ENCOUNTER — Ambulatory Visit: Payer: Medicaid Other | Admitting: Physical Therapy

## 2020-07-12 ENCOUNTER — Encounter: Payer: Self-pay | Admitting: Physical Therapy

## 2020-07-12 DIAGNOSIS — R2681 Unsteadiness on feet: Secondary | ICD-10-CM

## 2020-07-12 DIAGNOSIS — M6281 Muscle weakness (generalized): Secondary | ICD-10-CM | POA: Diagnosis not present

## 2020-07-12 NOTE — Therapy (Signed)
[place] MAIN [facility] SERVICES [address] [place], [place], [address] Phone: [phone]   Fax:  [phone]  Physical Therapy Treatment  Patient Details  Name: Renee Walton MRN: [id number] Date of Birth: [birth year] Referring Provider (PT): [place], [place]   Encounter Date: 07/12/2020   PT End of Session - 07/12/20 1153    Visit Number 27    Number of Visits 37    Date for PT Re-Evaluation 07/26/20    Authorization Type medicaid    Authorization Time Period 4 visits approved, 06/21/20-07/18/20    Authorization - Visit Number 3    Authorization - Number of Visits 4    PT Start Time 1146    PT Stop Time 1230    PT Time Calculation (min) 44 min    Equipment Utilized During Treatment Gait belt    Activity Tolerance Patient tolerated treatment well;No increased pain    Behavior During Therapy WFL for tasks assessed/performed           Past Medical History:  Diagnosis Date  . Breakthrough bleeding   . Cerebral palsy ([place])   . GAD (generalized anxiety disorder)   . Migraine headache     Past Surgical History:  Procedure Laterality Date  . LEG SURGERY     bilateral x2 in 2008    There were no vitals filed for this visit.   Subjective Assessment - 07/12/20 1152    Subjective Patient reports doing well. 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 Patient had outpatient PT from [facility] to improve strength, balance and gait safety; She stopped in October since she was going to [place] for her cousin's wedding. She reports currently she is still walking without AD. She reports approximtely 2-3 falls each week. She reports her left knee started hurting after one of her falls. She has not followed up with her PCP at this  time regarding knee pain; She reports adherence with HEP when her mom is able to help, She also still has a home health aide (4 hours a week). The Aide is helping with HEP, assists with household chores, etc.    Limitations Standing;Walking    How long can you stand comfortably? 10 min    How long can you walk comfortably? 10-15 min, limited due to knee pain    Diagnostic tests none recent    Patient Stated Goals balance, improve safety and stability with walking.    Currently in Pain? No/denies    Pain Onset 1 to 4 weeks ago    Multiple Pain Sites No             TREATMENT:  Educated patient in LE stretches that she can do on her own:   Seated piriformis stretch (figure4) with mod VCS for proper LE positioning for optimal stretch; Often patient's knee/hip internally rotates and adducts which limits stretch, 30 sec hold x2 reps each LE;  Long sitting hamstring stretch 1 min hold x 1 rep each LE with mod VCS to keep knee extended for better hamstring stretch; Patient does have difficulty getting into position requiring increased time due to hip adductor tone; She also required cues to keep toes up towards ceiling for better posterior leg stretch;  Standing heel off step calf stretch 30 sec hold x1 rep each LE with mod VCs for proper foot placement and weight shift for optimal calf stretch; Patient also required cues for erect posture to facilitate better knee extension for better stretch;  Attempted various positions for LE quad stretch including: standing, 1/2 kneel, prone, sidelying however patient unable to achieve or maintain proper position for optimal stretch. Recommend patient have caregiver continue to assist with prone quad stretch as this is less difficult for caregiver since she isn't lifting patient's leg and that will allow for better stretch; Patient agreeable;  She tolerated session well reporting no pain at end of session. Provided written HEP texted to patient for better  adherence so that she has video for better understanding.                           PT Education - 07/12/20 1153    Education provided Yes    Education Details stretches, HEP    Person(s) Educated Patient    Methods Explanation;Verbal cues;Handout    Comprehension Verbalized understanding;Verbal cues required;Returned demonstration            PT Short Term Goals - 06/14/20 1103      PT SHORT TERM GOAL #1   Title Patient will be adherent to HEP at least 3x a week to improve functional strength and balance for better safety at home.    Baseline has HEP, doing it 2-3 times per week dependening on help from Aide/mom; unable to go to gym due to [place], 12/13/19: 2 days 4/15: 2 days, 6/9: 1-2x a week, 7/14: 1-2x a week, 8/25: 2 days a week;    Time 4    Period Weeks    Status Partially Met    Target Date 05/31/20      PT SHORT TERM GOAL #2   Title Patient will exhibit improved safety awareness in the home reporting a maximum of 1 fall in last week.    Baseline 11/23/19: falls 3+ times a week, 12/13/19: no fall this past week, 4/15: 1 fall in last week    Time 4    Period Weeks    Status Achieved    Target Date 12/21/19      PT SHORT TERM GOAL #3   Title Pt will improve BLE hip abductor strength to 3-/5 for improved gluteal activation to improve stance control    Baseline 11/23/19: 2/5, 12/13/19: 2/5, 4/15: 2/5, 6/9: 2/5 strength, 7/14: 2/5, 8/25: 2/5    Time 4    Period Weeks    Status Not Met    Target Date 12/21/19             PT Long Term Goals - 06/14/20 1103      PT LONG TERM GOAL #1   Title Patient will increase Berg Balance score by > 6 points to demonstrate decreased fall risk during functional activities.    Baseline 11/23/19: 24/56, 12/13/19: 26/56, 7/14: 37/56, 8/25: 34/56    Time 12    Period Weeks    Status Achieved    Target Date 07/26/20      PT LONG TERM GOAL #2   Title Patient will increase six minute walk test distance to >1000 feet  without AD, supervision for progression to community ambulator and improve gait ability    Baseline 11/23/19: 650 feet with min A without AD, multiple stumbles, 12/13/19: 950 feet with min A, no AD,  multiple stumbles, 4/15: 1085 feet, min A, 7/14: 1050 supervision    Time 12    Period Weeks    Status Achieved    Target Date 07/26/20      PT LONG TERM GOAL #3   Title Pt will improve BLE hip abductor strength to 3+/5 for better functional strength in standing and to improve stability and balance.    Baseline 11/23/19: 2/5 bilaterally; 12/13/19: 2/5 bilaterally, 4/15: 2/5, 6/9: 2/5 strength bilaterally; 7/14: 2/5, 8/25: 2/5    Time 12    Period Weeks    Status Not Met    Target Date 07/26/20      PT LONG TERM GOAL #4   Title Patient will be independent in bending down towards floor and picking up small object (<5 pounds) and then stand back up without loss of balance as to improve ability to pick up and clean up room at home    Baseline 11/23/19: able to bend down, but loses balance upon standing up; 12/13/19: no change, 4/15: supervision, 7/14: supervision, 8/25: supervision    Time 12    Period Weeks    Status Partially Met    Target Date 07/26/20      PT LONG TERM GOAL #5   Title Patient will reduce timed up and go to <11 seconds to reduce fall risk and demonstrate improved transfer/gait ability.    Baseline 12/13/19: 16.5 sec without AD, min A, 4/15:17.5 sec without AD, supervision, 8/25: 19.68 without AD supervision    Time 12    Period Weeks    Status Partially Met    Target Date 07/26/20      PT LONG TERM GOAL #6   Title Patient will improve FOTO score to > 65% for improved functional mobility with ADLs.    Baseline 8/25: 68%    Time 12    Period Weeks    Status Achieved    Target Date 07/26/20                 Plan - 07/12/20 1326    Clinical Impression Statement Patient motivated and participated well within session. She was instructed in LE stretches that she can do on  her own for increased adherence. She doesn't have a home aide right now and her mom has hurt her back and is unable to assist with her stretches right now. therefore patient required increased education for proper positioning for optimal stretches. She did have difficulty acheiving proper position for quad stretch due to hip adductor tone and impaired trunk control/balance. Patient would benefit from additional skilled PT intervention to improve strength, balance and mobility;    Personal Factors and Comorbidities Comorbidity 2;Fitness;Past/Current Experience;Time since onset of injury/illness/exacerbation;Age    Comorbidities CP and anxiety;    Examination-Activity Limitations Caring for Others;Carry;Lift;Locomotion Level;Reach Overhead;Squat;Stairs;Stand;Transfers    Examination-Participation Restrictions Church;Cleaning;Community Activity;Driving;Laundry;Medication Management;Meal Prep;Shop;Volunteer;Yard Work    Stability/Clinical Decision Making Unstable/Unpredictable    Rehab Potential Good    PT Frequency 1x / week    PT Duration 12 weeks    PT Treatment/Interventions Patient/family education;Manual techniques;Therapeutic activities;Therapeutic exercise;Electrical Stimulation;Aquatic Therapy;DME Instruction;Functional mobility training;Gait training;Neuromuscular re-education;ADLs/Self Care Home Management;Cryotherapy;Moist Heat;Stair training;Orthotic Fit/Training;Energy conservation    PT Next Visit Plan assess/adjust HEP, walking program, strength and balance    PT Home Exercise Plan had HEP, will adjust next session;    Consulted and Agree with Plan of Care Patient           Patient will benefit from skilled therapeutic intervention  in order to improve the following deficits and impairments:  Abnormal gait, Decreased balance, Decreased endurance, Decreased mobility, Difficulty walking, Cardiopulmonary status limiting activity, Decreased activity tolerance, Decreased safety awareness,  Decreased strength  Visit Diagnosis: Muscle weakness (generalized)  Unsteadiness on feet     Problem List Patient Active Problem List   Diagnosis Date Noted  . Loss of weight 07/17/2015  . Depression 07/17/2015  . Cerebral palsy ([place]) 07/17/2015    Yulisa Chirico PT, DPT 07/12/2020, 1:28 PM  [place] MAIN [facility] SERVICES [address] [place], [place], [address] Phone: [phone]   Fax:  [phone]  Name: Renee Walton MRN: [id number] Date of Birth: 28-Nov-1995

## 2020-07-19 ENCOUNTER — Encounter: Payer: Self-pay | Admitting: Dermatology

## 2020-07-19 ENCOUNTER — Ambulatory Visit: Payer: Medicaid Other | Admitting: Physical Therapy

## 2020-07-19 ENCOUNTER — Other Ambulatory Visit: Payer: Self-pay

## 2020-07-19 ENCOUNTER — Ambulatory Visit (INDEPENDENT_AMBULATORY_CARE_PROVIDER_SITE_OTHER): Payer: Medicaid Other | Admitting: Dermatology

## 2020-07-19 DIAGNOSIS — B353 Tinea pedis: Secondary | ICD-10-CM

## 2020-07-19 DIAGNOSIS — L821 Other seborrheic keratosis: Secondary | ICD-10-CM

## 2020-07-19 DIAGNOSIS — L219 Seborrheic dermatitis, unspecified: Secondary | ICD-10-CM | POA: Diagnosis not present

## 2020-07-19 DIAGNOSIS — L709 Acne, unspecified: Secondary | ICD-10-CM

## 2020-07-19 DIAGNOSIS — R61 Generalized hyperhidrosis: Secondary | ICD-10-CM

## 2020-07-19 MED ORDER — KETOCONAZOLE 2 % EX SHAM: MEDICATED_SHAMPOO | CUTANEOUS | 1 refills | Status: DC

## 2020-07-19 MED ORDER — CICLOPIROX OLAMINE 0.77 % EX CREA: TOPICAL_CREAM | CUTANEOUS | 2 refills | Status: DC

## 2020-07-19 MED ORDER — FLUOCINOLONE ACETONIDE 0.01 % EX SOLN: CUTANEOUS | 1 refills | Status: DC

## 2020-07-19 MED ORDER — CLINDAMYCIN PHOS-BENZOYL PEROX 1.2-2.5 % EX GEL: CUTANEOUS | 1 refills | Status: DC

## 2020-07-19 NOTE — Progress Notes (Signed)
New Patient Visit  Subjective  Renee Walton is a 24 y.o. female who presents for the following: New Patient (Initial Visit).  Patient presents today as a new patient to establish care. Patient has a few areas of concern on her right foot worried about fungus. She notes trouble with acne for years. She's not currently using any treatment for it. It mostly affects her face. She gets red bumps and has been about the same lately as it usually is.   She also reports bothersome sweating of her hands and feet for years.  Patient would also like to know what type of skin she has and what would be a good skin regimen to follow and what to use for her hair. She has trouble with scale at her scalp and some itch of the scalp despite using head and shoulders shampoo.  The following portions of the chart were reviewed this encounter and updated as appropriate:  Allergies  Meds  Problems  Med Hx  Surg Hx  Fam Hx      Review of Systems:  No other skin or systemic complaints except as noted in HPI or Assessment and Plan.  Objective  Well appearing patient in no apparent distress; mood and affect are within normal limits.  A focused examination was performed including head, including the scalp, face, neck, nose, ears, eyelids, and lips, chest, back, lower legs, feet, and toenails. Relevant physical exam findings are noted in the Assessment and Plan.  Objective  B/L foot: Scaling and maceration web spaces and over distal and lateral soles.   Objective  face and back: Few small inflammatory papules at cheeks with trace open comedones Small scattered inflammatory papules at back  Objective  Mid Frontal Scalp: Pink patches with greasy scale.   Objective  Left Hand - Anterior: Sheen of sweat at feet.   Assessment & Plan  Tinea pedis of right foot B/L foot  Start Ciclopirox cream. Apply to both feet all over and in between toes for 1 month then weekly for prevention. She may have  early onychomycosis at the right 2nd toenail. It is not bothersome so we will defer treatment at this time.  ciclopirox (LOPROX) 0.77 % cream - B/L foot  Acne, unspecified acne type face and back  Chronic, flared  Start Clindaymycin benzoyl peroxide gel to affected areas once daily in Morning.  Benzoyl peroxide can cause dryness and irritation of the skin. It can also bleach fabric. When used together with Aczone (dapsone) cream, it can stain the skin orange.   Clindamycin Phos-Benzoyl Perox gel - face and back  Seborrheic keratosis  Seborrheic dermatitis Mid Frontal Scalp  Chronic, flared.   Start ketoconazole shampoo, massage in to scalp and leave in for 5 to 10 mins. Then can use conditioner of choice  Start Fluocinolone solution to scalp daily as needed. Avoid face, groin and underarms.   Topical steroids (such as triamcinolone, fluocinolone, fluocinonide, mometasone, clobetasol, halobetasol, betamethasone, hydrocortisone) can cause thinning and lightening of the skin if they are used for too long in the same area. Your physician has selected the right strength medicine for your problem and area affected on the body. Please use your medication only as directed by your physician to prevent side effects.    fluocinolone (SYNALAR) 0.01 % external solution - Mid Frontal Scalp  ketoconazole (NIZORAL) 2 % shampoo - Mid Frontal Scalp  Hyperhidrosis Left Hand - Anterior  Chronic, flared  Discussed glycopyrrolate PO and side effects. Patient defers  at this time due to risk of side effects. Recommend Carpe lotion twice a day as needed.   Return in about 6 weeks (around 08/30/2020).  ILeward Quan, CMA, am acting as scribe for Darden Dates, MD .  Documentation: I have reviewed the above documentation for accuracy and completeness, and I agree with the above.  Darden Dates, MD

## 2020-07-19 NOTE — Patient Instructions (Addendum)
Recommend daily broad spectrum sunscreen SPF 30+ to sun-exposed areas, reapply every 2 hours as needed. Call for new or changing lesions.  Benzoyl peroxide can cause dryness and irritation of the skin. It can also bleach fabric. When used together with Aczone (dapsone) cream, it can stain the skin orange.  Seb Derm: Ketoconazole shampoo: Massage in to scalp and leave in for 5 to 10 mins. Then rinse and use condition of choice Fluocinolone oil: Start Fluocinolone solution to scalp as needed for itch. Avoid face, groin and underarms  Acne: Start Clindamycin/Benzoyl Peroxide gel apply to face and back for acne once daily in the morning.   Tinea Pedis: Start Ciclopirox cream.Apply to both feet all over and in between toes for 1 month then weekly for prevention  Hyperhidrosis (excessive sweating) Recommend using Carpe lotion to hands and feet.

## 2020-07-20 ENCOUNTER — Encounter: Payer: Self-pay | Admitting: Physical Therapy

## 2020-07-20 ENCOUNTER — Ambulatory Visit: Payer: Medicaid Other | Admitting: Physical Therapy

## 2020-07-20 DIAGNOSIS — R2681 Unsteadiness on feet: Secondary | ICD-10-CM

## 2020-07-20 DIAGNOSIS — M6281 Muscle weakness (generalized): Secondary | ICD-10-CM

## 2020-07-20 NOTE — Therapy (Signed)
[place] [facility] [address] [place], [place], [address] Phone: [phone]   Fax:  [phone]  Physical Therapy Treatment/Discharge Summary  Patient Details  Name: Renee Walton MRN: [id number] Date of Birth: 09/19/1996 Referring Provider (PT): [place], [place]   Encounter Date: 07/20/2020   PT End of Session - 07/20/20 1147    Visit Number 28    Number of Visits 37    Date for PT Re-Evaluation 07/26/20    Authorization Type medicaid    Authorization Time Period 4 visits approved, 06/21/20-07/18/20    Authorization - Visit Number 4    Authorization - Number of Visits 4    PT Start Time 1146    PT Stop Time 1230    PT Time Calculation (min) 44 min    Equipment Utilized During Treatment Gait belt    Activity Tolerance Patient tolerated treatment well;No increased pain    Behavior During Therapy WFL for tasks assessed/performed           Past Medical History:  Diagnosis Date  . Breakthrough bleeding   . Cerebral palsy ([place])   . GAD (generalized anxiety disorder)   . Migraine headache     Past Surgical History:  Procedure Laterality Date  . LEG SURGERY     bilateral x2 in 2008    There were no vitals filed for this visit.   Subjective Assessment - 07/20/20 1150    Subjective Patient reports doing well. She reports some soreness in left hip and posterior leg; She asked, "What do you think about chiropractor? 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 Patient had outpatient PT from [place] to improve strength, balance and gait safety; She stopped in October since she was going to [place] for her cousin's wedding. She reports currently she is still walking without AD. She reports approximtely 2-3 falls each week. She reports her left knee started hurting after one of her falls. She has not followed up  with her PCP at this time regarding knee pain; She reports adherence with HEP when her mom is able to help, She also still has a home health aide (4 hours a week). The Aide is helping with HEP, assists with household chores, etc.    Limitations Standing;Walking    How long can you stand comfortably? 10 min    How long can you walk comfortably? 10-15 min, limited due to knee pain    Diagnostic tests none recent    Patient Stated Goals balance, improve safety and stability with walking.    Currently in Pain? Yes    Pain Score 4     Pain Location Leg    Pain Orientation Left    Pain Descriptors / Indicators Aching;Sore    Pain Type Acute pain    Pain Onset 1 to 4 weeks ago    Pain Frequency Intermittent    Aggravating Factors  worse with movement/stretch    Pain Relieving Factors rest    Effect of Pain on Daily Activities decreased activity tolerance;    Multiple Pain Sites No              [facility] PT Assessment - 07/20/20 0001      Observation/Other Assessments   Focus on Therapeutic Outcomes (FOTO)  67%    Activities of Balance Confidence Scale (ABC Scale)  32%  Berg Balance Test   Sit to Stand Able to stand without using hands and stabilize independently    Standing Unsupported Able to stand 2 minutes with supervision    Sitting with Back Unsupported but Feet Supported on Floor or Stool Able to sit safely and securely 2 minutes    Stand to Sit Sits safely with minimal use of hands    Transfers Able to transfer safely, definite need of hands    Standing Unsupported with Eyes Closed Able to stand 10 seconds safely    Standing Unsupported with Feet Together Needs help to attain position but able to stand for 30 seconds with feet together    From Standing, Reach Forward with Outstretched Arm Can reach forward >12 cm safely (5")    From Standing Position, Pick up Object from Floor Able to pick up shoe, needs supervision    From Standing Position, Turn to Look Behind Over each  Shoulder Looks behind one side only/other side shows less weight shift    Turn 360 Degrees Needs assistance while turning    Standing Unsupported, Alternately Place Feet on Step/Stool Able to complete >2 steps/needs minimal assist    Standing Unsupported, One Foot in Front Needs help to step but can hold 15 seconds    Standing on One Leg Unable to try or needs assist to prevent fall    Total Score 34    Berg comment: 100% risk for falls      Timed Up and Go Test   Normal TUG (seconds) 16.53    TUG Comments without AD, CGA for safety              TREATMENT: Warm up on treadmill 1.3 mph with 2 HHA x4 min with cues for increased erect posture, increased step length and improve ankle DF; patient exhibits increased foot drag with increased toe out (R>L) due to poor gluteal contraction with increased lateral trunk sway due to hip abductor weakness;   Seated figure 4 stretch 30 sec hold x1 reps with mod VCs for proper positioning;   NMR: Instructed patient in berg balance assessment, see above:   Standing on airex: -feet apart, side/side weight shift x10 reps with min A for hips in neutral position to facilitate better gluteal activation; Patient has significant difficulty often leading with UE with poor gluteal activation;  -feet apart, trunk rotation x5 reps each with cues for neutral trunk control -feet apart, heel raises x15 reps;    Patient required min-mod A for most balance exercise, and cues for weight shift for better stance control;Pt exhibits increased lateral loss of balance with poor gluteal control due to weakness. Patient reinforced in HEP; Patient verbalized understanding of HEP;                     PT Education - 07/20/20 1145    Education provided Yes    Education Details balance, progress towards goals, HEP    Person(s) Educated Patient    Methods Explanation;Verbal cues    Comprehension Verbalized understanding;Returned  demonstration;Verbal cues required;Need further instruction            PT Short Term Goals - 06/14/20 1103      PT SHORT TERM GOAL #1   Title Patient will be adherent to HEP at least 3x a week to improve functional strength and balance for better safety at home.    Baseline has HEP, doing it 2-3 times per week dependening on help from Aide/mom; unable  to go to gym due to COVID, 12/13/19: 2 days 4/15: 2 days, 6/9: 1-2x a week, 7/14: 1-2x a week, 8/25: 2 days a week;    Time 4    Period Weeks    Status Partially Met    Target Date 05/31/20      PT SHORT TERM GOAL #2   Title Patient will exhibit improved safety awareness in the home reporting a maximum of 1 fall in last week.    Baseline 11/23/19: falls 3+ times a week, 12/13/19: no fall this past week, 4/15: 1 fall in last week    Time 4    Period Weeks    Status Achieved    Target Date 12/21/19      PT SHORT TERM GOAL #3   Title Pt will improve BLE hip abductor strength to 3-/5 for improved gluteal activation to improve stance control    Baseline 11/23/19: 2/5, 12/13/19: 2/5, 4/15: 2/5, 6/9: 2/5 strength, 7/14: 2/5, 8/25: 2/5    Time 4    Period Weeks    Status Not Met    Target Date 12/21/19             PT Long Term Goals - 07/20/20 1148      PT LONG TERM GOAL #1   Title Patient will increase Berg Balance score by > 6 points to demonstrate decreased fall risk during functional activities.    Baseline 11/23/19: 24/56, 12/13/19: 26/56, 7/14: 37/56, 8/25: 34/56    Time 12    Period Weeks    Status Achieved    Target Date 07/26/20      PT LONG TERM GOAL #2   Title Patient will increase six minute walk test distance to >1000 feet without AD, supervision for progression to community ambulator and improve gait ability    Baseline 11/23/19: 650 feet with min A without AD, multiple stumbles, 12/13/19: 950 feet with min A, no AD, multiple stumbles, 4/15: 1085 feet, min A, 7/14: 1050 supervision    Time 12    Period Weeks    Status  Achieved    Target Date 07/26/20      PT LONG TERM GOAL #3   Title Pt will improve BLE hip abductor strength to 3+/5 for better functional strength in standing and to improve stability and balance.    Baseline 11/23/19: 2/5 bilaterally; 12/13/19: 2/5 bilaterally, 4/15: 2/5, 6/9: 2/5 strength bilaterally; 7/14: 2/5, 8/25: 2/5    Time 12    Period Weeks    Status Not Met    Target Date 07/26/20      PT LONG TERM GOAL #4   Title Patient will be independent in bending down towards floor and picking up small object (<5 pounds) and then stand back up without loss of balance as to improve ability to pick up and clean up room at home    Baseline 11/23/19: able to bend down, but loses balance upon standing up; 12/13/19: no change, 4/15: supervision, 7/14: supervision, 8/25: supervision    Time 12    Period Weeks    Status Partially Met    Target Date 07/26/20      PT LONG TERM GOAL #5   Title Patient will reduce timed up and go to <11 seconds to reduce fall risk and demonstrate improved transfer/gait ability.    Baseline 12/13/19: 16.5 sec without AD, min A, 4/15:17.5 sec without AD, supervision, 8/25: 19.68 without AD supervision    Time 12    Period Weeks  Status Partially Met    Target Date 07/26/20      PT LONG TERM GOAL #6   Title Patient will improve FOTO score to > 65% for improved functional mobility with ADLs.    Baseline 8/25: 68%    Time 12    Period Weeks    Status Achieved    Target Date 07/26/20                 Plan - 07/20/20 1307    Clinical Impression Statement Patient motivated and participated well within session.She reports adherence with LE stretches and reports she has been doing better with her exercises at home, doing 30 min 2x a day. Patient was instructed in balance outcome measures to address progress towards goals. She is still considered a high fall risk. She did exhibit slight improvement in timed up and go but is also considered at an increased risk for  falls. She continues to have weakness in BLE gluteal muscles which limits stance control and gait safety. She has exhausted her PT insurance coverage for the calendar year. Therefore she will be discharged at this time. Recommend discharge this visit; Pt would be elligible for additional PT coverage in 2022 year and would benefit from additional skilled PT intervention to reduce tone, improve hip strength and balance/mobility;    Personal Factors and Comorbidities Comorbidity 2;Fitness;Past/Current Experience;Time since onset of injury/illness/exacerbation;Age    Comorbidities CP and anxiety;    Examination-Activity Limitations Caring for Others;Carry;Lift;Locomotion Level;Reach Overhead;Squat;Stairs;Stand;Transfers    Examination-Participation Restrictions Church;Cleaning;Community Activity;Driving;Laundry;Medication Management;Meal Prep;Shop;Volunteer;Yard Work    Stability/Clinical Decision Making Unstable/Unpredictable    Rehab Potential Good    PT Frequency 1x / week    PT Duration 12 weeks    PT Treatment/Interventions Patient/family education;Manual techniques;Therapeutic activities;Therapeutic exercise;Electrical Stimulation;Aquatic Therapy;DME Instruction;Functional mobility training;Gait training;Neuromuscular re-education;ADLs/Self Care Home Management;Cryotherapy;Moist Heat;Stair training;Orthotic Fit/Training;Energy conservation    PT Next Visit Plan assess/adjust HEP, walking program, strength and balance    PT Home Exercise Plan had HEP, will adjust next session;    Consulted and Agree with Plan of Care Patient           Patient will benefit from skilled therapeutic intervention in order to improve the following deficits and impairments:  Abnormal gait, Decreased balance, Decreased endurance, Decreased mobility, Difficulty walking, Cardiopulmonary status limiting activity, Decreased activity tolerance, Decreased safety awareness, Decreased strength  Visit Diagnosis: Muscle  weakness (generalized)  Unsteadiness on feet     Problem List Patient Active Problem List   Diagnosis Date Noted  . Loss of weight 07/17/2015  . Depression 07/17/2015  . Cerebral palsy ([place]) 07/17/2015    Calisha Tindel PT, DPT 07/20/2020, 1:15 PM  [place] [facility] [address] [place], [place], [address] Phone: [phone]   Fax:  [phone]  Name: Renee Walton MRN: [id number] Date of Birth: 1996-01-13

## 2020-07-26 ENCOUNTER — Other Ambulatory Visit: Payer: Self-pay

## 2020-07-26 ENCOUNTER — Telehealth: Payer: Self-pay

## 2020-07-26 DIAGNOSIS — L219 Seborrheic dermatitis, unspecified: Secondary | ICD-10-CM

## 2020-07-26 MED ORDER — CLINDAMYCIN PHOS-BENZOYL PEROX 1.2-5 % EX GEL: 1.0000 "application " | Freq: Every day | CUTANEOUS | 1 refills | Status: DC

## 2020-07-26 MED ORDER — MOMETASONE FUROATE 0.1 % EX SOLN: Freq: Every day | CUTANEOUS | 0 refills | Status: DC

## 2020-07-26 NOTE — Telephone Encounter (Signed)
Patient's Fluocinolone Solution not covered. Medicaid's preferred list includes: Fluticasone Cream/Ointment or Mometasone Cream/Ointment/Solution.

## 2020-07-26 NOTE — Progress Notes (Signed)
CLindamycin-BP changed to generic Duac strength due to preferred formulary options.

## 2020-07-26 NOTE — Telephone Encounter (Signed)
Please substitute mometasone solution. Thank you!

## 2020-09-05 ENCOUNTER — Ambulatory Visit: Payer: Medicaid Other | Admitting: Dermatology

## 2020-12-13 ENCOUNTER — Ambulatory Visit: Payer: Medicaid Other | Admitting: Physical Therapy

## 2020-12-20 ENCOUNTER — Ambulatory Visit: Payer: Medicaid Other | Admitting: Physical Therapy

## 2020-12-28 ENCOUNTER — Encounter: Payer: Self-pay | Admitting: Physical Therapy

## 2020-12-28 ENCOUNTER — Other Ambulatory Visit: Payer: Self-pay

## 2020-12-28 ENCOUNTER — Ambulatory Visit: Payer: Medicaid Other | Attending: Internal Medicine

## 2020-12-28 DIAGNOSIS — R2681 Unsteadiness on feet: Secondary | ICD-10-CM | POA: Insufficient documentation

## 2020-12-28 DIAGNOSIS — M6281 Muscle weakness (generalized): Secondary | ICD-10-CM | POA: Insufficient documentation

## 2020-12-28 DIAGNOSIS — G809 Cerebral palsy, unspecified: Secondary | ICD-10-CM | POA: Insufficient documentation

## 2020-12-28 NOTE — Therapy (Signed)
[facility] MAIN [facility] SERVICES [address] [place], [place], [address] Phone: [phone]   Fax:  [phone]  Physical Therapy Evaluation  Patient Details  Name: Renee Walton MRN: [id number] Date of Birth: May 04, 1996 Referring Provider (PT): [name] MD   Encounter Date: 12/28/2020   PT End of Session - 12/28/20 1229    Visit Number 1    Number of Visits 25    Date for PT Re-Evaluation 03/22/21    Authorization Type Medicaid    Authorization - Visit Number 1    Authorization - Number of Visits 10    PT Start Time 1111    PT Stop Time 1206    PT Time Calculation (min) 55 min    Equipment Utilized During Treatment Gait belt    Activity Tolerance Patient tolerated treatment well    Behavior During Therapy [facility] for tasks assessed/performed           Past Medical History:  Diagnosis Date  . Breakthrough bleeding   . Cerebral palsy (HCC)   . GAD (generalized anxiety disorder)   . Migraine headache     Past Surgical History:  Procedure Laterality Date  . LEG SURGERY     bilateral x2 in 2008    There were no vitals filed for this visit.    Subjective Assessment - 12/28/20 1115    Subjective Pt is a pleasant 25 y.o. 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 Pt familiar to [facility] OPPT clinic with last bout of therapy in September of 2021. Pt has a PMH of anxiety and migraine headaches. Per prior documentation pt is capable of ambulating without an AD. Pt reports ambulating with no AD at home but uses her walker in community.    Patient is accompained by: Family member    Pertinent History Pt returns to therapy after previous bout of CP. Pt states her major goal with therapy is to improve her mobility as much as possible before returning to [place] this upcoming September. Pt states [place] is not as ADA compliant for her as the [place] and wants to improve her walking and balance to feel more confident because she can  not bring her walker on the airplane. Pt reports meeting with MD about possible foot alignment surgery on April 19th and her MD in [place] wants to perform a hip adductor release. 2 falls have occurred in last 6 months due to balance loss but no injury and able to stand indep with use of UE's to helpherself up. Pt does not specify which direction her LOB occurs. Pt uses gym 2x/week for stretching and leg exercises and is motivated to improve her mobility.    Limitations Standing;Walking    How long can you sit comfortably? Unlimited    How long can you stand comfortably? 2 hours    How long can you walk comfortably? 30 min    Patient Stated Goals Improve mobility so pt can safely walk in [place].    Currently in Pain? No/denies    Pain Score 0-No pain              [facility] PT Assessment - 12/28/20 1122      Assessment   Medical Diagnosis Spastic Diplegia CP    Referring Provider (PT) [name] MD    Hand Dominance Left    Next MD Visit April 19th with Orthopedic, [place] with Dr. Welton Flakes    Prior Therapy September 2021  Precautions   Precautions Fall      Balance Screen   Has the patient fallen in the past 6 months Yes    How many times? 2-3    Has the patient had a decrease in activity level because of a fear of falling?  No    Is the patient reluctant to leave their home because of a fear of falling?  No      Home Tourist information centre managernvironment   Living Environment Private residence    Research officer, trade unionLiving Arrangements Parent;Other relatives    Available Help at Discharge Family    Type of Home House    Home Access Level entry    Home Layout Two level    Alternate Level Stairs-Number of Steps 12    Alternate Level Stairs-Rails Right    Home Equipment Walker - 2 wheels;Wheelchair - power   Reports getting ramp and W/c rack   Additional Comments Lives in multilevel home, room and bath on the 2nd floor; level entry home; mom supervision with tub transfers; but patient mod I for bathing;      Prior  Function   Level of Independence Independent with household mobility with device    Leisure pt still uses power chair prn at home and outside of home; Warden/rangerGraphic design      Cognition   Overall Cognitive Status History of cognitive impairments - at baseline            PAIN: 0/10 NPS  POSTURE: Slumped posture in sitting with knees adducted and IR due to tone. In standing pt in high guard position and kyphotic posture.    AROM BLE: Limited hip abduction, flexion, and knee extension AROM due to hip adductor tone. Ankle DF limited due to weakness but with PROM pt displayed DF beyond 90 deg (neutral).   STRENGTH:  Graded on a 0-5 scale; NT = not tested Muscle Group Left Right  Shoulder flex    Shoulder Abd    Shoulder Ext    Shoulder IR/ER    Elbow    Wrist/hand    Hip Flex 3-/5 3-/5  Hip Abd 2/5 2/5  Hip Add 4/5 4/5  Hip Ext NT/5 NT/5  Hip IR/ER NT/5 NT/5  Knee Flex 5/5 5/5  Knee Ext 3+/5 3+/5  Ankle DF 3/5 2+/5  Ankle PF 2/5 2/5   SENSATION: Normal to LT in lumbar dermatomes  COORDINATION: Finger to Nose: Dysmetric  Heel Shin Slide Test: DYsmetric   FUNCTIONAL MOBILITY:  Sit to stand: Pt able to sit to stand with supervision. Use of BUE support to stand    BALANCE: Static Sitting Balance  Normal Able to maintain balance against maximal resistance   Good Able to maintain balance against moderate resistance X  Good-/Fair+ Accepts minimal resistance   Fair Able to sit unsupported without balance loss and without UE support   Poor+ Able to maintain with Minimal assistance from individual or chair   Poor Unable to maintain balance-requires mod/max support from individual or chair    Static Standing Balance  Normal Able to maintain standing balance against maximal resistance   Good Able to maintain standing balance against moderate resistance   Good-/Fair+ Able to maintain standing balance against minimal resistance   Fair Able to stand unsupported without UE support  and without LOB for 1-2 min X  Fair- Requires Min A and UE support to maintain standing without loss of balance   Poor+ Requires mod A and UE support to maintain standing without loss  of balance   Poor Requires max A and UE support to maintain standing balance without loss    Dynamic Sitting Balance  Normal Able to sit unsupported and weight shift across midline maximally   Good Able to sit unsupported and weight shift across midline moderately X  Good-/Fair+ Able to sit unsupported and weight shift across midline minimally   Fair Minimal weight shifting ipsilateral/front, difficulty crossing midline   Fair- Reach to ipsilateral side and unable to weight shift   Poor + Able to sit unsupported with min A and reach to ipsilateral side, unable to weight shift   Poor Able to sit unsupported with mod A and reach ipsilateral/front-can't cross midline    Standing Dynamic Balance  Normal Stand independently unsupported, able to weight shift and cross midline maximally   Good Stand independently unsupported, able to weight shift and cross midline moderately   Good-/Fair+ Stand independently unsupported, able to weight shift across midline minimally   Fair Stand independently unsupported, weight shift, and reach ipsilaterally, loss of balance when crossing midline   Poor+ Able to stand with Min A and reach ipsilaterally, unable to weight shift   Poor Able to stand with Mod A and minimally reach ipsilaterally, unable to cross midline. X     GAIT: Pt ambulates with hips IR and adducted and waddling gait due to poor glut activation and hip adductor tone in BLE's. Pt's UE's in high guard due to tone with decreased step lengths bilaterally. Pt displayed two anterior LOB due to poor foot clearance requiring PT MaxA+1 to prevent fall.  OUTCOME MEASURES: TEST Outcome Interpretation  5 times sit<>stand 10.33 sec >60 yo, >15 sec indicates increased risk for falls  10 meter walk test                 m/s <1.0  m/s indicates increased risk for falls; limited community ambulator  Timed up and Go                 sec <14 sec indicates increased risk for falls  6 minute walk test                Feet 1000 feet is community Financial controller Next session <36/56 (100% risk for falls), 37-45 (80% risk for falls); 46-51 (>50% risk for falls); 52-55 (lower risk <25% of falls)  FOTO Risk adjust 51     Objective measurements completed on examination: See above findings.    PT Education - 12/28/20 1228    Education provided Yes    Education Details POC going forward. HEP stretches (long sitting for hamstrings, prone quad stretch with mom assist, faber stretch in seated, calf stretch in standing    Person(s) Educated Patient    Methods Explanation;Demonstration;Tactile cues;Verbal cues    Comprehension Verbalized understanding;Returned demonstration;Verbal cues required;Need further instruction            PT Short Term Goals - 12/28/20 1241      PT SHORT TERM GOAL #1   Title Pt will be indep with performing HEP 3x/week to improve LE tone and strength for improved safety with at home ambulation and ADL's.    Baseline 3/10: Given LE stretches    Time 6    Period Weeks    Status New    Target Date 02/08/21             PT Long Term Goals - 12/28/20 1242      PT LONG TERM GOAL #  1   Title Patient will increase Berg Balance score by > 6 points to demonstrate decreased fall risk during functional activities.    Baseline 12/28/20: Perform next session    Time 12    Period Weeks    Status New    Target Date 03/22/21      PT LONG TERM GOAL #2   Title Pt will reduce 5xsSTS time to < 8 sec to demonstrate clinically significant improvement in LE strength and balance to reduce risk of falls.    Baseline 3/10: 10.33 sec no UE support    Time 12    Period Weeks    Status New    Target Date 03/22/21      PT LONG TERM GOAL #3   Title Pt will improve 6 MWT to > 1000' with no AD and no  LOB to demonstrate ability to improve safety with community distances.    Baseline 3/10: 510'    Time 12    Period Weeks    Status New    Target Date 03/22/21      PT LONG TERM GOAL #4   Title Pt will improve 10 m gait speed with no AD > 1.0 m/s to demonstrate reduced risk of falls along with safe ability to complete community walking tasks.    Baseline 3/10: 0.78 m/s    Time 12    Period Weeks    Status New    Target Date 03/22/21      PT LONG TERM GOAL #5   Title Pt will improve FOTO score to 64 demonstrate perceived improvements in ability to complete ADL's.    Baseline 3/10: Risk adjusted 51    Time 12    Period Weeks    Status New    Target Date 03/22/21                  Plan - 12/28/20 1230    Clinical Impression Statement Pt is a pleasant, highly motivated 25 y.o. female with spastic diplegia CP with a PMH of migraines and anxiety. Pt displays significant B hip adductor tone that is stronger with knees extended and is lessened when knees bent. Adductor tone significantly impacting pt's glut strength limiting ambulatory tasks causing knee valgus and B trendelenburg. Pt amb with waddling type gait with decreased foot clearance bilat with B LE's in knee valgus due to poor glut activation and adductor tone. Significant LE weakness tested via MMT placing pt at increased risk of falls primarily in proximal hips and DF in ankles. Pt completed 5xSTS in 10.33 sec with no UE support but limited hip extension putting pt at increased risk of falls being > 10 sec. Pt ambualted at a gait speed of 0.78 m/s and completed 6 MWT at 510' both with no AD. During 6 MWT pt had two anterior LOB due to poor foot clearance causing scuffing of feet requiring PT maxA+1 to correct. These times as evidenced place pt at significant risk of falls. Berg balance to be performed next session due to pt being late to initial eval to further assess balance. Pt can benefit from skilled PT treatment to improve  functional capacity for ADL completion and to reduce risk of falls.    Personal Factors and Comorbidities Comorbidity 2;Fitness;Past/Current Experience;Time since onset of injury/illness/exacerbation;Age    Comorbidities CP and anxiety;    Examination-Participation Restrictions Church;Cleaning;Community Activity;Driving;Laundry;Medication Management;Meal Prep;Shop;Volunteer;Yard Work    Stability/Clinical Decision Making Unstable/Unpredictable    Visual merchandiser  Clinical Presentation due to: Has had 2 falls in last 6 months.    Rehab Potential Fair    PT Frequency 2x / week    PT Duration 12 weeks    PT Treatment/Interventions Patient/family education;Manual techniques;Therapeutic activities;Therapeutic exercise;Electrical Stimulation;Aquatic Therapy;DME Instruction;Functional mobility training;Gait training;Neuromuscular re-education;ADLs/Self Care Home Management;Cryotherapy;Moist Heat;Stair training;Orthotic Fit/Training;Energy conservation    PT Next Visit Plan Berg balance, LE stretching    PT Home Exercise Plan Long sitting hamstring stretch, prone quad stretch, figure 4/FABER stretch, standing/long sitting calf stretch    Consulted and Agree with Plan of Care Patient    Family Member Consulted mom           Patient will benefit from skilled therapeutic intervention in order to improve the following deficits and impairments:  Abnormal gait,Decreased balance,Decreased endurance,Decreased mobility,Difficulty walking,Cardiopulmonary status limiting activity,Decreased activity tolerance,Decreased safety awareness,Decreased strength  Visit Diagnosis: Muscle weakness (generalized)  Unsteadiness on feet  Cerebral palsy, unspecified type [facility])     Problem List Patient Active Problem List   Diagnosis Date Noted  . Loss of weight 07/17/2015  . Depression 07/17/2015  . Cerebral palsy (HCC) 07/17/2015    Delphia Grates. Fairly IV, PT, DPT Physical Therapist- [facility]  12/28/2020, 5:04 PM  [facility] MAIN [facility] SERVICES [address] [place], [place], [address] Phone: [phone]   Fax:  [phone]  Name: Stayce Delancy MRN: [id number] Date of Birth: May 21, 1996

## 2021-01-04 ENCOUNTER — Ambulatory Visit: Payer: Medicaid Other | Admitting: Physical Therapy

## 2021-01-11 ENCOUNTER — Ambulatory Visit: Payer: Medicaid Other | Admitting: Physical Therapy

## 2021-01-18 ENCOUNTER — Ambulatory Visit: Payer: Medicaid Other

## 2021-01-18 ENCOUNTER — Other Ambulatory Visit: Payer: Self-pay

## 2021-01-18 ENCOUNTER — Encounter: Payer: Self-pay | Admitting: Physical Therapy

## 2021-01-18 DIAGNOSIS — M6281 Muscle weakness (generalized): Secondary | ICD-10-CM | POA: Diagnosis not present

## 2021-01-18 DIAGNOSIS — R2681 Unsteadiness on feet: Secondary | ICD-10-CM

## 2021-01-18 NOTE — Therapy (Signed)
[facility] MA[facility] SERVICES [address] [place], [place], [address] Phone: [phone]   Fax:  [phone]  Physical Therapy Treatment  Patient Details  Name: Renee BaldFatima Walton MRN: [id number] Date of Birth: [birth year] Referring Provider (PT): Margaretann LovelessKhan, Neelam S. MD   Encounter Date: 01/18/2021   PT End of Session - 01/18/21 1150    Visit Number 2    Number of Visits 25    Date for PT Re-Evaluation 03/22/21    Authorization Type Medicaid    Authorization - Visit Number 2    Authorization - Number of Visits 10    PT Start Time 1100    PT Stop Time 1144    PT Time Calculation (min) 44 min    Equipment Utilized During Treatment Gait belt    Activity Tolerance Patient tolerated treatment well    Behavior During Therapy WFL for tasks assessed/performed           Past Medical History:  Diagnosis Date  . Breakthrough bleeding   . Cerebral palsy (HCC)   . GAD (generalized anxiety disorder)   . Migraine headache     Past Surgical History:  Procedure Laterality Date  . LEG SURGERY     bilateral x2 in 2008    There were no vitals filed for this visit.   Subjective Assessment - 01/18/21 1100    Subjective Pt reports her LE tone has been worse with colder weather. 2 falls since eval falling losing her balance forward. No injuries from falls.    Patient is accompained by: Family member    Pertinent History Pt returns to therapy after previous bout of CP. Pt states her major goal with therapy is to improve her mobility as much as possible before returning to [place] this upcoming September. Pt [place] and wants to improve her walking and balance to feel more confident because she [place] not bring [place] walker on the airplane. Pt reports meeting with MD about possible foot alignment surgery on April 19th and her MD in [place] wants to perform a hip adductor release. 2 falls have occurred in  last 6 months due to balance loss but no injury and able to stand indep with use of UE's to helpherself up. Pt does not specify which direction her LOB occurs. Pt uses gym 2x/week for stretching and leg exercises and is motivated to improve her mobility.    Limitations Standing;Walking    How long can you sit comfortably? Unlimited    How long can you stand comfortably? 2 hours    How long can you walk comfortably? 30 min    Patient Stated Goals Improve mobility so pt can safely walk in [place].    Currently in Pain? Yes    Pain Score 2     Pain Location Knee    Pain Orientation Left    Pain Descriptors / Indicators Aching    Pain Type Chronic pain    Pain Onset More than a month ago              [facility] PT Assessment - 01/18/21 1103      Berg Balance Test   Sit to Stand Able to stand without using hands and stabilize independently    Standing Unsupported Able to stand 2 minutes with supervision    Sitting with Back Unsupported but Feet Supported on Floor or Stool Able to sit safely and securely 2  minutes    Stand to Sit Sits safely with minimal use of hands    Transfers Able to transfer safely, definite need of hands    Standing Unsupported with Eyes Closed Able to stand 10 seconds safely    Standing Unsupported with Feet Together Needs help to attain position but able to stand for 30 seconds with feet together    From Standing, Reach Forward with Outstretched Arm Can reach forward >12 cm safely (5")    From Standing Position, Pick up Object from Floor Able to pick up shoe, needs supervision    From Standing Position, Turn to Look Behind Over each Shoulder Looks behind one side only/other side shows less weight shift    Turn 360 Degrees Needs assistance while turning    Standing Unsupported, Alternately Place Feet on Step/Stool Needs assistance to keep from falling or unable to try    Standing Unsupported, One Foot in [employer] balance while stepping or standing    Standing on  One Leg Unable to try or needs assist to prevent fall    Total Score 32           There.ex:   First part of session focused on completing Berg balance. Pt scored 32/56 which is a decrease in score from previous PT certification period last year where pt scoring 37/56 placing pt at increased risk of falls.   Stretching in supine/prone to reduce LE tone to improve gait (RLE: 3x30 sec, LLE 1x30sec)  Knees to chest with contralateral hand on ASIS of pelvis and LE in extension to improve stretch  Figure 4/faber stretch    Quad stretch: 2x30/LE with PT hand placed on pt's PSIS to stabilize pelvis and reduce hip flexor activation   Prone hamstring curls with YTB: 1x10/LLE, 1x5 RLE. Requird PT assist on RLE against gravity due to hamstring weakness   Hook lying bridges: 1x12. 2x10 with YTB around femoral condyles to activate lateral hip stabilizers. Mod VC for increasing tension on YTB  Ambulation 140' with CGA. Reduced scissoring of gait after stretching and exercise, however still relying on heavy lat lean on stance leg and circumduction on swing leg for ambulation due to poor glut activation and proximal LE weakness. Mod VC's on reducing gait speed.      Hip pain from 4/10 NPS to 0/10 NPS after session. Pt required mod multimodal cues throughout session to improve form/technique with treatment session with fair carryover.     PT Education - 01/18/21 1150    Education provided Yes    Education Details Form/technique with exercise.    Person(s) Educated Patient    Methods Explanation;Demonstration;Tactile cues;Verbal cues    Comprehension Verbalized understanding;Returned demonstration;Verbal cues required;Need further instruction            PT Short Term Goals - 12/28/20 1241      PT SHORT TERM GOAL #1   Title Pt will be indep with performing HEP 3x/week to improve LE tone and strength for improved safety with at home ambulation and ADL's.    Baseline 3/10: Given LE stretches     Time 6    Period Weeks    Status New    Target Date 02/08/21             PT Long Term Goals - 01/18/21 1208      PT LONG TERM GOAL #1   Title Patient will increase Berg Balance score by > 6 points to demonstrate decreased fall risk during functional activities.  Baseline 12/28/20: 32/56    Time 12    Period Weeks    Status New      PT LONG TERM GOAL #2   Title Pt will reduce 5xsSTS time to < 8 sec to demonstrate clinically significant improvement in LE strength and balance to reduce risk of falls.    Baseline 3/10: 10.33 sec no UE support    Time 12    Period Weeks    Status New      PT LONG TERM GOAL #3   Title Pt will improve 6 MWT to > 1000' with no AD and no LOB to demonstrate ability to improve safety with community distances.    Baseline 3/10: 510'    Time 12    Period Weeks    Status New      PT LONG TERM GOAL #4   Title Pt will improve 10 m gait speed with no AD > 1.0 m/s to demonstrate reduced risk of falls along with safe ability to complete community walking tasks.    Baseline 3/10: 0.78 m/s    Time 12    Period Weeks    Status New      PT LONG TERM GOAL #5   Title Pt will improve FOTO score to 64 demonstrate perceived improvements in ability to complete ADL's.    Baseline 3/10: Risk adjusted 51    Time 12    Period Weeks    Status New                 Plan - 01/18/21 1151    Clinical Impression Statement Berg balance score assessed for long term goal because unable to perform at eval due to time constraints. Pt scored 32/56 which is decreased from previous evaluation in 2021 where pt scoring a 37 placing pt at further increased risk of falls. After Sharlene Motts, focus of session on reducing LE tone and L hip pain to improve ambulation. After stretching pt reports no hip pain and displayed ability to ambulate 140' with reduced scissoring gait. However still relying on lateral lean on each LE during stance phase and circumduction during swing phase on  contralateral LE due to poor glute activation along with decreased foot clearance. Pt can benefit from further skilled PT services to redcue risk of falls and maximize functional capacity to complete ADL's with more independence.    Personal Factors and Comorbidities Comorbidity 2;Fitness;Past/Current Experience;Time since onset of injury/illness/exacerbation;Age    Comorbidities CP and anxiety;    Examination-Participation Restrictions Church;Cleaning;Community Activity;Driving;Laundry;Medication Management;Meal Prep;Shop;Volunteer;Yard Work    Stability/Clinical Decision Making Unstable/Unpredictable    Rehab Potential Fair    PT Frequency 2x / week    PT Duration 12 weeks    PT Treatment/Interventions Patient/family education;Manual techniques;Therapeutic activities;Therapeutic exercise;Electrical Stimulation;Aquatic Therapy;DME Instruction;Functional mobility training;Gait training;Neuromuscular re-education;ADLs/Self Care Home Management;Cryotherapy;Moist Heat;Stair training;Orthotic Fit/Training;Energy conservation    PT Next Visit Plan LE stretching, balance, gait    PT Home Exercise Plan Long sitting hamstring stretch, prone quad stretch, figure 4/FABER stretch, standing/long sitting calf stretch    Consulted and Agree with Plan of Care Patient    Family Member Consulted mom           Patient will benefit from skilled therapeutic intervention in order to improve the following deficits and impairments:  Abnormal gait,Decreased balance,Decreased endurance,Decreased mobility,Difficulty walking,Cardiopulmonary status limiting activity,Decreased activity tolerance,Decreased safety awareness,Decreased strength  Visit Diagnosis: Muscle weakness (generalized)  Unsteadiness on feet     Problem List Patient Active Problem List  Diagnosis Date Noted  . Loss of weight 07/17/2015  . Depression 07/17/2015  . Cerebral palsy (HCC) 07/17/2015    Delphia Grates. Fairly IV, PT, DPT Physical  Therapist- [facility]  01/18/2021, 12:08 PM  [facility] MAIN [facility] SERVICES [address] [place], [place], [address] Phone: [phone]   Fax:  [phone]  Name: Renee Walton MRN: [id number] Date of Birth: 02-16-1996

## 2021-01-25 ENCOUNTER — Ambulatory Visit: Payer: Medicaid Other | Attending: Internal Medicine

## 2021-01-25 ENCOUNTER — Other Ambulatory Visit: Payer: Self-pay

## 2021-01-25 DIAGNOSIS — G809 Cerebral palsy, unspecified: Secondary | ICD-10-CM | POA: Diagnosis present

## 2021-01-25 DIAGNOSIS — M6281 Muscle weakness (generalized): Secondary | ICD-10-CM | POA: Diagnosis present

## 2021-01-25 DIAGNOSIS — R2681 Unsteadiness on feet: Secondary | ICD-10-CM | POA: Diagnosis present

## 2021-01-25 NOTE — Therapy (Signed)
[facility] MAIN [facility] SERVICES [address] [place], [place], [address] Phone: [phone]   Fax:  [phone]  Physical Therapy Treatment  Patient Details  Name: Renee Walton MRN: [id number] Date of Birth: July 15, 1996 Referring Provider (PT): [name] MD   Encounter Date: 01/25/2021   PT End of Session - 01/25/21 1124    Visit Number 3    Number of Visits 25    Date for PT Re-Evaluation 03/22/21    Authorization Type Medicaid    Authorization Time Period 4 visits approved, 06/21/20-07/18/20    Authorization - Visit Number 3    Authorization - Number of Visits 10    PT Start Time 1117    PT Stop Time 1157    PT Time Calculation (min) 40 min    Equipment Utilized During Treatment Gait belt    Activity Tolerance Patient tolerated treatment well;No increased pain    Behavior During Therapy WFL for tasks assessed/performed           Past Medical History:  Diagnosis Date  . Breakthrough bleeding   . Cerebral palsy (HCC)   . GAD (generalized anxiety disorder)   . Migraine headache     Past Surgical History:  Procedure Laterality Date  . LEG SURGERY     bilateral x2 in 2008    There were no vitals filed for this visit.   Subjective Assessment - 01/25/21 1122    Subjective Pt reports she is doing well today in general, no pain today. No falls since prior PT session.    Pertinent History Pt returns to therapy after previous bout of CP. Pt states her major goal with therapy is to improve her mobility as much as possible before returning to [place] this upcoming September. Pt states [place] is not as ADA compliant for her as the [place] and wants to improve her walking and balance to feel more confident because she can not bring her walker on the airplane. Pt reports meeting with MD about possible foot alignment surgery on April 19th and her MD in [place] wants to perform a hip adductor release. 2 falls have occurred in last 6  months due to balance loss but no injury and able to stand indep with use of UE's to helpherself up. Pt does not specify which direction her LOB occurs. Pt uses gym 2x/week for stretching and leg exercises and is motivated to improve her mobility.    Currently in Pain? No/denies          INTERVENTION THIS DATE:  -review of HEP stretching from prior DC *seated FABER 2x30bilat *seated HS, no subjective stretch, no over limitation, deferred *standing heel hang off claf stretch 2 min  -AMB overground 365ft clockwaise loop, minGuardA -AMB overground 135ft counterclockwise loop, minGuardA c 2lb AW (good subjective balance challenge, no LOB) -AMB overground 145ft clockwise loop, minGuardA c 2lb AW (good subjective balance challenge, no LOB)  -FWD step on/over off red matt + 180 degree turnaround x3 (min-modA for trunk stability assist) (very difficult) 1 fall forward, arrested by author assist -retro AMB firm surface minGuard to minA 1x71ft    PT Short Term Goals - 12/28/20 1241      PT SHORT TERM GOAL #1   Title Pt will be indep with performing HEP 3x/week to improve LE tone and strength for improved safety with at home ambulation and ADL's.    Baseline 3/10: Given LE stretches    Time 6  Period Weeks    Status New    Target Date 02/08/21             PT Long Term Goals - 01/18/21 1208      PT LONG TERM GOAL #1   Title Patient will increase Berg Balance score by > 6 points to demonstrate decreased fall risk during functional activities.    Baseline 12/28/20: 32/56    Time 12    Period Weeks    Status New      PT LONG TERM GOAL #2   Title Pt will reduce 5xsSTS time to < 8 sec to demonstrate clinically significant improvement in LE strength and balance to reduce risk of falls.    Baseline 3/10: 10.33 sec no UE support    Time 12    Period Weeks    Status New      PT LONG TERM GOAL #3   Title Pt will improve 6 MWT to > 1000' with no AD and no LOB to demonstrate ability to  improve safety with community distances.    Baseline 3/10: 510'    Time 12    Period Weeks    Status New      PT LONG TERM GOAL #4   Title Pt will improve 10 m gait speed with no AD > 1.0 m/s to demonstrate reduced risk of falls along with safe ability to complete community walking tasks.    Baseline 3/10: 0.78 m/s    Time 12    Period Weeks    Status New      PT LONG TERM GOAL #5   Title Pt will improve FOTO score to 64 demonstrate perceived improvements in ability to complete ADL's.    Baseline 3/10: Risk adjusted 51    Time 12    Period Weeks    Status New                Plan - 01/25/21 1124    Clinical Impression Statement Continued with current plan of care as laid out in evaluation and recent prior sessions. Pt remains motivated to advance progress toward goals. Rest breaks provided as needed, pt quick to ask when needed. Today, session focussed heavily on motor control training and balance in gait. Pt does require varying levels of assistance and cuing for completion of exercises for correct form and sometimes due to pain/weakness. Pt closely monitored throughout session for safe vitals response and to maximize patient safety during interventions. Pt requires near-constant minA for more difficulty interventions. Pt continues to demonstrate progress toward goals AEB progression of some interventions this date either in volume or intensity.   Personal Factors and Comorbidities Comorbidity 2;Fitness;Past/Current Experience;Time since onset of injury/illness/exacerbation;Age    Comorbidities CP and anxiety;    Examination-Activity Limitations Bed Mobility    Examination-Participation Restrictions Church;Cleaning;Community Activity;Driving;Laundry;Medication Management;Meal Prep;Shop;Volunteer;Yard Work    Conservation officer, historic buildings Unstable/Unpredictable    Visual merchandiser    Rehab Potential Fair    PT Frequency 2x / week    PT Duration 12 weeks    PT  Treatment/Interventions Patient/family education;Manual techniques;Therapeutic activities;Therapeutic exercise;Electrical Stimulation;Aquatic Therapy;DME Instruction;Functional mobility training;Gait training;Neuromuscular re-education;ADLs/Self Care Home Management;Cryotherapy;Moist Heat;Stair training;Orthotic Fit/Training;Energy conservation    PT Next Visit Plan LE stretching, balance, gait    PT Home Exercise Plan Long sitting hamstring stretch, prone quad stretch, figure 4/FABER stretch, standing/long sitting calf stretch    Consulted and Agree with Plan of Care Patient  Patient will benefit from skilled therapeutic intervention in order to improve the following deficits and impairments:  Abnormal gait,Decreased balance,Decreased endurance,Decreased mobility,Difficulty walking,Cardiopulmonary status limiting activity,Decreased activity tolerance,Decreased safety awareness,Decreased strength  Visit Diagnosis: Muscle weakness (generalized)  Unsteadiness on feet  Cerebral palsy, unspecified type [facility])     Problem List Patient Active Problem List   Diagnosis Date Noted  . Loss of weight 07/17/2015  . Depression 07/17/2015  . Cerebral palsy (HCC) 07/17/2015   12:03 PM, 01/25/21 Rosamaria Lints, PT, DPT Physical Therapist - [facility]  Outpatient Physical Therapy- Main Campus [phone]     Rosamaria Lints 01/25/2021, 11:33 AM  [facility] MAIN [facility] SERVICES [address] [place], [place], [address] Phone: [phone]   Fax:  [phone]  Name: Renee Walton MRN: [id number] Date of Birth: 07/03/1996

## 2021-02-01 ENCOUNTER — Ambulatory Visit: Payer: Medicaid Other | Admitting: Physical Therapy

## 2021-02-08 ENCOUNTER — Ambulatory Visit: Payer: Medicaid Other | Admitting: Physical Therapy

## 2021-02-15 ENCOUNTER — Ambulatory Visit: Payer: Medicaid Other | Admitting: Physical Therapy

## 2021-02-15 ENCOUNTER — Other Ambulatory Visit: Payer: Self-pay

## 2021-02-15 ENCOUNTER — Encounter: Payer: Self-pay | Admitting: Physical Therapy

## 2021-02-15 DIAGNOSIS — M6281 Muscle weakness (generalized): Secondary | ICD-10-CM

## 2021-02-15 DIAGNOSIS — R2681 Unsteadiness on feet: Secondary | ICD-10-CM

## 2021-02-15 NOTE — Therapy (Signed)
[facility] MAIN [facility] SERVICES [address] [place], [place], [address] Phone: [phone]   Fax:  [phone]  Physical Therapy Treatment  Patient Details  Name: Renee Walton MRN: [id number] Date of Birth: Feb 25, 1996 Referring Provider (PT): [name] MD   Encounter Date: 02/15/2021   PT End of Session - 02/15/21 1116    Visit Number 4    Number of Visits 25    Date for PT Re-Evaluation 03/22/21    Authorization Type Medicaid    Authorization Time Period 4 visits approved, 06/21/20-07/18/20    Authorization - Visit Number 4    Authorization - Number of Visits 10    PT Start Time 1115    PT Stop Time 1200    PT Time Calculation (min) 45 min    Equipment Utilized During Treatment Gait belt    Activity Tolerance Patient tolerated treatment well;No increased pain    Behavior During Therapy WFL for tasks assessed/performed           Past Medical History:  Diagnosis Date  . Breakthrough bleeding   . Cerebral palsy (HCC)   . GAD (generalized anxiety disorder)   . Migraine headache     Past Surgical History:  Procedure Laterality Date  . LEG SURGERY     bilateral x2 in 2008    There were no vitals filed for this visit.   Subjective Assessment - 02/15/21 1114    Subjective Pt reports she is still waiting on approval from respite worker/home aide; She is currently stretching 1x a week; Hasn't been to gym in last 2 weeks due to ramadan but is hoping to start back again soon. She reports 2 falls in last 2 weeks; She reports having trouble keeping balance if someone is walking around her.    Pertinent History Pt returns to therapy after previous bout of CP. Pt states her major goal with therapy is to improve her mobility as much as possible before returning to [place] this upcoming September. Pt states [place] is not as ADA compliant for her as the [place] and wants to improve her walking and balance to feel more confident  because she can not bring her walker on the airplane. Pt reports meeting with MD about possible foot alignment surgery on April 19th and her MD in [place] wants to perform a hip adductor release. 2 falls have occurred in last 6 months due to balance loss but no injury and able to stand indep with use of UE's to helpherself up. Pt does not specify which direction her LOB occurs. Pt uses gym 2x/week for stretching and leg exercises and is motivated to improve her mobility.    Currently in Pain? No/denies    Multiple Pain Sites No               Warm up on treadmill 1.3 mph with 2 HHA x4 min with cues for increased erect posture, increased step length and improve ankle DF; patient exhibits increased foot drag with increased toe out (R>L) due to poor gluteal contraction with increased lateral trunk sway due to hip abductor weakness;  Patient exhibits increased shortness of breath, vitals: spo2:    HR:    Standing heel off step calf stretch 30 sec hold x2 reps bilaterally;    NMR:  Standing on airex: -feet apart, side/side weight shift x10 reps with min A for hips in neutral position to facilitate better gluteal activation; Patient has significant difficulty often leading with  UE with poor gluteal activation;  -feet apart, trunk rotation x5 reps each with cues for neutral trunk control -feet apart, heel raises x15 reps, partial ROM due to calf weakness with heavy forward weight shift;  -Standing one foot on airex, one foot on 4 inch step:   Unsupported standing 10 sec hold x2 reps each foot on step;  Unsupported standing cone pass side/side x5 reps each foot on step Patient has increased difficulty with staggered stance due to weakness in gluteal musc. Often losing balance laterally and leaning with upper trunk for balance recovery;     Gait around gym x200 feet with intermittent stopping unsupported. Patient able to walk in open area with close supervision and stop requiring CGA to avoid loss  of balance. She does require CGA for safety and will reach out occasional to try and maintain balance.  Weaving around cones #5 without AD,min A for safety x2 sets with cues for increased step length and to improve turning for better dynamic balance control; Patient had increased difficulty turning often knocking over cones;   PT performed passive stretches at end of session to facilitate improved LE flexibility:  -passive SLR hamstring stretch with PT providing pressure to contralateral leg to reduce hip flexion for LE disassociation 30 sec hold x2 reps; -Passive single knee to chest stretch 15 sec hold x2 reps; -Passive figure 4 piriformis stretch 30 sec hold x2 reps; -passive hip adductor stretch (butterfly) position 30 sec hold x1 rep;   Patient required min-mod A for most balance exercise, and cues for weight shift for better stance control; Pt exhibits increased lateral loss of balance with poor gluteal control due to weakness. Patient reinforced in HEP; Patient verbalized understanding of HEP;                           PT Education - 02/15/21 1116    Education provided Yes    Education Details balance/gait; stretches; HEP    Person(s) Educated Patient    Methods Explanation;Verbal cues    Comprehension Verbalized understanding;Returned demonstration;Verbal cues required;Need further instruction            PT Short Term Goals - 12/28/20 1241      PT SHORT TERM GOAL #1   Title Pt will be indep with performing HEP 3x/week to improve LE tone and strength for improved safety with at home ambulation and ADL's.    Baseline 3/10: Given LE stretches    Time 6    Period Weeks    Status New    Target Date 02/08/21             PT Long Term Goals - 01/18/21 1208      PT LONG TERM GOAL #1   Title Patient will increase Berg Balance score by > 6 points to demonstrate decreased fall risk during functional activities.    Baseline 12/28/20: 32/56    Time 12     Period Weeks    Status New      PT LONG TERM GOAL #2   Title Pt will reduce 5xsSTS time to < 8 sec to demonstrate clinically significant improvement in LE strength and balance to reduce risk of falls.    Baseline 3/10: 10.33 sec no UE support    Time 12    Period Weeks    Status New      PT LONG TERM GOAL #3   Title Pt will improve 6 MWT to >  1000' with no AD and no LOB to demonstrate ability to improve safety with community distances.    Baseline 3/10: 510'    Time 12    Period Weeks    Status New      PT LONG TERM GOAL #4   Title Pt will improve 10 m gait speed with no AD > 1.0 m/s to demonstrate reduced risk of falls along with safe ability to complete community walking tasks.    Baseline 3/10: 0.78 m/s    Time 12    Period Weeks    Status New      PT LONG TERM GOAL #5   Title Pt will improve FOTO score to 64 demonstrate perceived improvements in ability to complete ADL's.    Baseline 3/10: Risk adjusted 51    Time 12    Period Weeks    Status New                 Plan - 02/15/21 1217    Clinical Impression Statement Patient motivated and participated well within session. She was instructed in advanced balance tasks, standing on compliant surfaces without rail assist. Patient requires min-mod A for most balance activities to facilitate better neutral weight shift. She was instructed in staggered stance with one foot on step to faciltiate better gluteal activation. Patient continues to have significant weakness in hip abduction and increased hip adductor tone which limits gait ability. She reports having difficulty doing LE stretches at home as she doesn't have home aide and her mom isn't able to stretch her much. Recommend patient increase overall activity for cardiovascular conditioning;    Personal Factors and Comorbidities Comorbidity 2;Fitness;Past/Current Experience;Time since onset of injury/illness/exacerbation;Age    Comorbidities CP and anxiety;     Examination-Activity Limitations Bed Mobility    Examination-Participation Restrictions Church;Cleaning;Community Activity;Driving;Laundry;Medication Management;Meal Prep;Shop;Volunteer;Yard Work    Stability/Clinical Decision Making Unstable/Unpredictable    Rehab Potential Fair    PT Frequency 2x / week    PT Duration 12 weeks    PT Treatment/Interventions Patient/family education;Manual techniques;Therapeutic activities;Therapeutic exercise;Electrical Stimulation;Aquatic Therapy;DME Instruction;Functional mobility training;Gait training;Neuromuscular re-education;ADLs/Self Care Home Management;Cryotherapy;Moist Heat;Stair training;Orthotic Fit/Training;Energy conservation    PT Next Visit Plan LE stretching, balance, gait    PT Home Exercise Plan Long sitting hamstring stretch, prone quad stretch, figure 4/FABER stretch, standing/long sitting calf stretch    Consulted and Agree with Plan of Care Patient           Patient will benefit from skilled therapeutic intervention in order to improve the following deficits and impairments:  Abnormal gait,Decreased balance,Decreased endurance,Decreased mobility,Difficulty walking,Cardiopulmonary status limiting activity,Decreased activity tolerance,Decreased safety awareness,Decreased strength  Visit Diagnosis: Muscle weakness (generalized)  Unsteadiness on feet     Problem List Patient Active Problem List   Diagnosis Date Noted  . Loss of weight 07/17/2015  . Depression 07/17/2015  . Cerebral palsy (HCC) 07/17/2015    Quina Wilbourne PT, DPT 02/15/2021, 12:20 PM  [facility] MAIN [facility] SERVICES [address] [place], [place], [address] Phone: [phone]   Fax:  [phone]  Name: Renee Walton MRN: [id number] Date of Birth: May 11, 1996

## 2021-02-22 ENCOUNTER — Ambulatory Visit: Payer: Medicaid Other | Admitting: Physical Therapy

## 2021-03-01 ENCOUNTER — Other Ambulatory Visit: Payer: Self-pay

## 2021-03-01 ENCOUNTER — Ambulatory Visit: Payer: Medicaid Other | Attending: Internal Medicine | Admitting: Physical Therapy

## 2021-03-01 ENCOUNTER — Encounter: Payer: Self-pay | Admitting: Physical Therapy

## 2021-03-01 DIAGNOSIS — R2681 Unsteadiness on feet: Secondary | ICD-10-CM | POA: Insufficient documentation

## 2021-03-01 DIAGNOSIS — M6281 Muscle weakness (generalized): Secondary | ICD-10-CM | POA: Diagnosis not present

## 2021-03-01 NOTE — Therapy (Signed)
[facility] MAIN [facility] SERVICES [address] [place], [place], [address] Phone: [phone]   Fax:  [phone]  Physical Therapy Treatment  Patient Details  Name: Renee Walton MRN: [id number] Date of Birth: 1996/05/30 Referring Provider (PT): [name] MD   Encounter Date: 03/01/2021   PT End of Session - 03/01/21 1113    Visit Number 5    Number of Visits 25    Date for PT Re-Evaluation 03/22/21    Authorization Type Medicaid    Authorization Time Period authorized 12 visits 4/21-7/13    Authorization - Visit Number 5    Authorization - Number of Visits 10    PT Start Time 1115    PT Stop Time 1200    PT Time Calculation (min) 45 min    Equipment Utilized During Treatment Gait belt    Activity Tolerance Patient tolerated treatment well;No increased pain    Behavior During Therapy WFL for tasks assessed/performed           Past Medical History:  Diagnosis Date  . Breakthrough bleeding   . Cerebral palsy (HCC)   . GAD (generalized anxiety disorder)   . Migraine headache     Past Surgical History:  Procedure Laterality Date  . LEG SURGERY     bilateral x2 in 2008    There were no vitals filed for this visit.   Subjective Assessment - 03/01/21 1118    Subjective Patient presents to therapy with new home aide. She reports she went to gym yesterday and was on the treadmill and did leg press as well as other equipment with good tolerance. She will be going to gym 2x a week;    Pertinent History Pt returns to therapy after previous bout of CP. Pt states her major goal with therapy is to improve her mobility as much as possible before returning to [place] this upcoming September. Pt states [place] is not as ADA compliant for her as the [place] and wants to improve her walking and balance to feel more confident because she can not bring her walker on the airplane. Pt reports meeting with MD about possible foot alignment  surgery on April 19th and her MD in [place] wants to perform a hip adductor release. 2 falls have occurred in last 6 months due to balance loss but no injury and able to stand indep with use of UE's to helpherself up. Pt does not specify which direction her LOB occurs. Pt uses gym 2x/week for stretching and leg exercises and is motivated to improve her mobility.    Currently in Pain? No/denies    Multiple Pain Sites No                 TREATMENT:    NMR:     Gait around gym x200 feet with intermittent stopping unsupported. Patient able to walk in open area with close supervision and stop requiring CGA to avoid loss of balance. She does require CGA for safety and will reach out occasional to try and maintain balance.  Gait outside: Progressed gait training to walking outside on unlevel surface  x100 feet on grass while holding small soccer ball with min-mod A to avoid loss of balance;   x150 feet on concrete with cues for directional awareness, with min A for safety Attempted backward walking on grass but patient unable due to weakness and tone;  x15 feet side stepping on grass with mod A for balance with increased lateral  loss of balance and difficulty stepping with abduction often ER due to weakness;  When walking on grass patient required min A for gait safety with increased lateral trunk lean and instability due to unstable surface;   She was able to walk on concrete with CGA with reciprocal gait pattern, however required increased assistance when passing ball, increased toe out with increased lateral trunk lean due to hip abductor weakness. Patient does tend to walk quickly often losing balance forward.    She does require min A when changing surfaces due to instability;     PT performed passive stretches at end of session to facilitate improved LE flexibility:  -passive SLR hamstring stretch with PT providing pressure to contralateral leg to reduce hip flexion for LE  disassociation 30 sec hold x2 reps; -Passive single knee to chest stretch 15 sec hold x2 reps; -passive hip adductor stretch (butterfly) position 30 sec hold x1 rep;  -passive quad stretch 30 sec hold x 2 reps in prone;  Patient required min-mod A for most balance exercise, and cues for weight shift for better stance control; Pt exhibits increased lateral loss of balance with poor gluteal control due to weakness. Patient reinforced in HEP; Patient verbalized understanding of HEP;                       PT Education - 03/01/21 1113    Education provided Yes    Education Details balance/gait safety, HEP    Person(s) Educated Patient    Methods Explanation;Verbal cues    Comprehension Verbalized understanding;Returned demonstration;Verbal cues required;Need further instruction            PT Short Term Goals - 12/28/20 1241      PT SHORT TERM GOAL #1   Title Pt will be indep with performing HEP 3x/week to improve LE tone and strength for improved safety with at home ambulation and ADL's.    Baseline 3/10: Given LE stretches    Time 6    Period Weeks    Status New    Target Date 02/08/21             PT Long Term Goals - 01/18/21 1208      PT LONG TERM GOAL #1   Title Patient will increase Berg Balance score by > 6 points to demonstrate decreased fall risk during functional activities.    Baseline 12/28/20: 32/56    Time 12    Period Weeks    Status New      PT LONG TERM GOAL #2   Title Pt will reduce 5xsSTS time to < 8 sec to demonstrate clinically significant improvement in LE strength and balance to reduce risk of falls.    Baseline 3/10: 10.33 sec no UE support    Time 12    Period Weeks    Status New      PT LONG TERM GOAL #3   Title Pt will improve 6 MWT to > 1000' with no AD and no LOB to demonstrate ability to improve safety with community distances.    Baseline 3/10: 510'    Time 12    Period Weeks    Status New      PT LONG TERM GOAL #4    Title Pt will improve 10 m gait speed with no AD > 1.0 m/s to demonstrate reduced risk of falls along with safe ability to complete community walking tasks.    Baseline 3/10: 0.78 m/s  Time 12    Period Weeks    Status New      PT LONG TERM GOAL #5   Title Pt will improve FOTO score to 64 demonstrate perceived improvements in ability to complete ADL's.    Baseline 3/10: Risk adjusted 51    Time 12    Period Weeks    Status New                 Plan - 03/01/21 1254    Clinical Impression Statement Patient motivated and participated well within session. Instructed patient in advanced balance tasks with walking on level/unlevel surface. Patient requires min-mod A while walking on unlevel surface with significant difficulty on grass. She was unable to step backwards with RLE due to weakness and instability; Patient required cues for weight shift to better initiate movement on unlevel surface. However due to tone and weakness had difficulty stepping with RLE. Patient responds well to stretches reporting less stiffness. she would benefit from additional skilled PT intervention to improve strength, balance and gait safety;    Personal Factors and Comorbidities Comorbidity 2;Fitness;Past/Current Experience;Time since onset of injury/illness/exacerbation;Age    Comorbidities CP and anxiety;    Examination-Activity Limitations Bed Mobility    Examination-Participation Restrictions Church;Cleaning;Community Activity;Driving;Laundry;Medication Management;Meal Prep;Shop;Volunteer;Yard Work    Stability/Clinical Decision Making Unstable/Unpredictable    Rehab Potential Fair    PT Frequency 2x / week    PT Duration 12 weeks    PT Treatment/Interventions Patient/family education;Manual techniques;Therapeutic activities;Therapeutic exercise;Electrical Stimulation;Aquatic Therapy;DME Instruction;Functional mobility training;Gait training;Neuromuscular re-education;ADLs/Self Care Home  Management;Cryotherapy;Moist Heat;Stair training;Orthotic Fit/Training;Energy conservation    PT Next Visit Plan LE stretching, balance, gait    PT Home Exercise Plan Long sitting hamstring stretch, prone quad stretch, figure 4/FABER stretch, standing/long sitting calf stretch    Consulted and Agree with Plan of Care Patient           Patient will benefit from skilled therapeutic intervention in order to improve the following deficits and impairments:  Abnormal gait,Decreased balance,Decreased endurance,Decreased mobility,Difficulty walking,Cardiopulmonary status limiting activity,Decreased activity tolerance,Decreased safety awareness,Decreased strength  Visit Diagnosis: Muscle weakness (generalized)  Unsteadiness on feet     Problem List Patient Active Problem List   Diagnosis Date Noted  . Loss of weight 07/17/2015  . Depression 07/17/2015  . Cerebral palsy (HCC) 07/17/2015    Khole Arterburn PT, DPT 03/01/2021, 1:01 PM  [place] [facility] MAIN [facility] SERVICES [address] [place], [place], [address] Phone: [phone]   Fax:  [phone]  Name: Renee Walton MRN: [id number] Date of Birth: 28-Mar-1996

## 2021-03-08 ENCOUNTER — Other Ambulatory Visit: Payer: Self-pay

## 2021-03-08 ENCOUNTER — Encounter: Payer: Self-pay | Admitting: Physical Therapy

## 2021-03-08 ENCOUNTER — Ambulatory Visit: Payer: Medicaid Other | Admitting: Physical Therapy

## 2021-03-08 DIAGNOSIS — R2681 Unsteadiness on feet: Secondary | ICD-10-CM

## 2021-03-08 DIAGNOSIS — M6281 Muscle weakness (generalized): Secondary | ICD-10-CM | POA: Diagnosis not present

## 2021-03-08 NOTE — Therapy (Signed)
[facility] MAIN [facility] SERVICES [address] [place], [place], [address] Phone: [phone]   Fax:  [phone]  Physical Therapy Treatment  Patient Details  Name: Renee Walton MRN: [id number] Date of Birth: 11-Jun-1996 Referring Provider (PT): [name] MD   Encounter Date: 03/08/2021   PT End of Session - 03/08/21 1119    Visit Number 6    Number of Visits 25    Date for PT Re-Evaluation 03/22/21    Authorization Type Medicaid    Authorization Time Period authorized 12 visits 4/21-7/13    Authorization - Visit Number 6    Authorization - Number of Visits 10    PT Start Time 1119    PT Stop Time 1200    PT Time Calculation (min) 41 min    Equipment Utilized During Treatment Gait belt    Activity Tolerance Patient tolerated treatment well;No increased pain    Behavior During Therapy WFL for tasks assessed/performed           Past Medical History:  Diagnosis Date  . Breakthrough bleeding   . Cerebral palsy (HCC)   . GAD (generalized anxiety disorder)   . Migraine headache     Past Surgical History:  Procedure Laterality Date  . LEG SURGERY     bilateral x2 in 2008    There were no vitals filed for this visit.   Subjective Assessment - 03/08/21 1122    Subjective Patient reports going to the gym and walking on the treadmill 1.7 mph for 15 min and HR was 108 following gait which is an improvement and shows conditioning; Patient reports doing well, no soreness today;    Pertinent History Pt returns to therapy after previous bout of CP. Pt states her major goal with therapy is to improve her mobility as much as possible before returning to [place] this upcoming September. Pt states [place] is not as ADA compliant for her as the [place] and wants to improve her walking and balance to feel more confident because she [place] not bring [place] walker on the airplane. Pt reports meeting with MD about possible foot alignment surgery  on April 19th and her MD in [place] wants to perform a hip adductor release. 2 falls have occurred in last 6 months due to balance loss but no injury and able to stand indep with use of UE's to helpherself up. Pt does not specify which direction her LOB occurs. Pt uses gym 2x/week for stretching and leg exercises and is motivated to improve her mobility.    Currently in Pain? No/denies    Multiple Pain Sites No                     TREATMENT:  NMR:  Gait around gym x200 feet with intermittent stopping unsupported. Patient able to walk in open area with close supervision and stoprequiring CGA to avoid loss of balance. She does requireCGAfor safety and will reach out occasional to try and maintain balance.  Gait outside: Progressed gait training to walking outside on unlevel surface x100 feet x2 on grasswhile holding small soccer ballwith min-mod A to avoid loss of balance; x150 feet x2 on concrete with cues for directional awareness, with min A for safety  When walking on grass patient required min- modA for gait safety with increased lateral trunk lean and instability due to unstable surface;   She was able to walk on concrete with CGA with reciprocal gait pattern, however required  increased assistance when passing ball, increased toe out with increased lateral trunk lean due to hip abductor weakness. Patient does tend to walk quickly often losing balance forward.   She does require min A when changing surfaces due to instability;   Instructed patient in LE ROM/Strengthening on swing as patient has a swing at home that she sits in often. Instructed patient to work on propelling self forward/backward in swing slowly working on hip/knee flexion/extension. Patient exhibits increased extensor tone having difficulty relaxing into flexed posture.  Patient required min-modA for most balance exercise, and cues for weight shift for better stance control;Pt exhibits  increasedlateral loss of balance with poor gluteal control due to weakness. Patient reinforced in HEP;Patient verbalized understanding of HEP;                    PT Education - 03/08/21 1118    Education provided Yes    Education Details balance/gait safety/ HEP    Person(s) Educated Patient    Methods Explanation;Verbal cues    Comprehension Verbalized understanding;Returned demonstration;Verbal cues required;Need further instruction            PT Short Term Goals - 12/28/20 1241      PT SHORT TERM GOAL #1   Title Pt will be indep with performing HEP 3x/week to improve LE tone and strength for improved safety with at home ambulation and ADL's.    Baseline 3/10: Given LE stretches    Time 6    Period Weeks    Status New    Target Date 02/08/21             PT Long Term Goals - 01/18/21 1208      PT LONG TERM GOAL #1   Title Patient will increase Berg Balance score by > 6 points to demonstrate decreased fall risk during functional activities.    Baseline 12/28/20: 32/56    Time 12    Period Weeks    Status New      PT LONG TERM GOAL #2   Title Pt will reduce 5xsSTS time to < 8 sec to demonstrate clinically significant improvement in LE strength and balance to reduce risk of falls.    Baseline 3/10: 10.33 sec no UE support    Time 12    Period Weeks    Status New      PT LONG TERM GOAL #3   Title Pt will improve 6 MWT to > 1000' with no AD and no LOB to demonstrate ability to improve safety with community distances.    Baseline 3/10: 510'    Time 12    Period Weeks    Status New      PT LONG TERM GOAL #4   Title Pt will improve 10 m gait speed with no AD > 1.0 m/s to demonstrate reduced risk of falls along with safe ability to complete community walking tasks.    Baseline 3/10: 0.78 m/s    Time 12    Period Weeks    Status New      PT LONG TERM GOAL #5   Title Pt will improve FOTO score to 64 demonstrate perceived improvements in ability to  complete ADL's.    Baseline 3/10: Risk adjusted 51    Time 12    Period Weeks    Status New                 Plan - 03/08/21 1245    Clinical Impression  Statement Patient motivated and participated well within session. educated patient in ways to obtain a beach wheelchair for when she goes on vacation. Instructed patient in dynamic balance/gait safety on uneven surfaces. She continues to require min-mod A when walking in thick grass with heavy posterior lean and loss of balance. Patient also instructed in LE ROM/strengthening on swing to facilitate better hip/knee motor control. Patient exhibits increased extensor tone having difficulty relaxing into hip/knee flexion for forward propulsion on swing. She would benefit from additional skilled PT intervention to improve strength, balance and gait safety;    Personal Factors and Comorbidities Comorbidity 2;Fitness;Past/Current Experience;Time since onset of injury/illness/exacerbation;Age    Comorbidities CP and anxiety;    Examination-Activity Limitations Bed Mobility    Examination-Participation Restrictions Church;Cleaning;Community Activity;Driving;Laundry;Medication Management;Meal Prep;Shop;Volunteer;Yard Work    Stability/Clinical Decision Making Unstable/Unpredictable    Rehab Potential Fair    PT Frequency 2x / week    PT Duration 12 weeks    PT Treatment/Interventions Patient/family education;Manual techniques;Therapeutic activities;Therapeutic exercise;Electrical Stimulation;Aquatic Therapy;DME Instruction;Functional mobility training;Gait training;Neuromuscular re-education;ADLs/Self Care Home Management;Cryotherapy;Moist Heat;Stair training;Orthotic Fit/Training;Energy conservation    PT Next Visit Plan LE stretching, balance, gait    PT Home Exercise Plan Long sitting hamstring stretch, prone quad stretch, figure 4/FABER stretch, standing/long sitting calf stretch    Consulted and Agree with Plan of Care Patient            Patient will benefit from skilled therapeutic intervention in order to improve the following deficits and impairments:  Abnormal gait,Decreased balance,Decreased endurance,Decreased mobility,Difficulty walking,Cardiopulmonary status limiting activity,Decreased activity tolerance,Decreased safety awareness,Decreased strength  Visit Diagnosis: Muscle weakness (generalized)  Unsteadiness on feet     Problem List Patient Active Problem List   Diagnosis Date Noted  . Loss of weight 07/17/2015  . Depression 07/17/2015  . Cerebral palsy (HCC) 07/17/2015    Rapheal Masso PT, DPT 03/08/2021, 12:49 PM  [facility] MAIN [facility] SERVICES [address] [place], [place], [address] Phone: [phone]   Fax:  [phone]  Name: Novalee Horsfall MRN: [id number] Date of Birth: March 24, 1996

## 2021-03-15 ENCOUNTER — Encounter: Payer: Self-pay | Admitting: Physical Therapy

## 2021-03-15 ENCOUNTER — Ambulatory Visit: Payer: Medicaid Other | Admitting: Physical Therapy

## 2021-03-15 ENCOUNTER — Other Ambulatory Visit: Payer: Self-pay

## 2021-03-15 DIAGNOSIS — R2681 Unsteadiness on feet: Secondary | ICD-10-CM

## 2021-03-15 DIAGNOSIS — M6281 Muscle weakness (generalized): Secondary | ICD-10-CM

## 2021-03-15 NOTE — Therapy (Signed)
[place] [facility] MAIN [facility] SERVICES [address] [place], [place], [address] Phone: [phone]   Fax:  [phone]  Physical Therapy Treatment  Patient Details  Name: Renee Walton MRN: [id number] Date of Birth: 08-Oct-1996 Referring Provider (PT): [name] MD   Encounter Date: 03/15/2021   PT End of Session - 03/15/21 1112    Visit Number 7    Number of Visits 25    Date for PT Re-Evaluation 03/22/21    Authorization Type Medicaid    Authorization Time Period authorized 12 visits 4/21-7/13    Authorization - Visit Number 7    Authorization - Number of Visits 10    PT Start Time 1110    PT Stop Time 1145    PT Time Calculation (min) 35 min    Equipment Utilized During Treatment Gait belt    Activity Tolerance Patient tolerated treatment well;No increased pain    Behavior During Therapy WFL for tasks assessed/performed           Past Medical History:  Diagnosis Date  . Breakthrough bleeding   . Cerebral palsy (HCC)   . GAD (generalized anxiety disorder)   . Migraine headache     Past Surgical History:  Procedure Laterality Date  . LEG SURGERY     bilateral x2 in 2008    There were no vitals filed for this visit.   Subjective Assessment - 03/15/21 1115    Subjective Patient reports doing well. No pain. Reports still going to the gym.    Pertinent History Pt returns to therapy after previous bout of CP. Pt states her major goal with therapy is to improve her mobility as much as possible before returning to [place] this upcoming September. Pt states [place] is not as ADA compliant for her as the [place] and wants to improve her walking and balance to feel more confident because she can not bring her walker on the airplane. Pt reports meeting with MD about possible foot alignment surgery on April 19th and her MD in [place] wants to perform a hip adductor release. 2 falls have occurred in last 6 months due to balance loss  but no injury and able to stand indep with use of UE's to helpherself up. Pt does not specify which direction her LOB occurs. Pt uses gym 2x/week for stretching and leg exercises and is motivated to improve her mobility.    Currently in Pain? No/denies    Multiple Pain Sites No                   NMR:   Standing on airex: -feet apart, side/side weight shift x10 reps with min A for hips in neutral position to facilitate better gluteal activation; Patient has significant difficulty often leading with UE with poor gluteal activation;  -feet apart mini squat with cues for hip abduction/knees apart x10 reps;  -feet apart, heel raises x10 reps, partial ROM due to calf weakness with heavy forward weight shift;  -Standing one foot on airex, one foot on 4 inch step:              Unsupported standing 10 sec hold x2 reps each foot on step;  Unsupported standing trunk rotation x5 reps each;              Unsupported standing wand flexion x10 reps each foot on step Patient has increased difficulty with staggered stance due to weakness in gluteal musc. Often losing balance laterally and  leaning with upper trunk for balance recovery;     Weaving around cones #4 without AD,min A for safety x2 sets with cues for increased step length and to improve turning for better dynamic balance control; Patient had increased difficulty turning often knocking over cones;    PT performed passive stretches at end of session to facilitate improved LE flexibility:  Seated piriformis stretch 30 sec hold x1 reps each;   Patient required min-mod A for most balance exercise, and cues for weight shift for better stance control; Pt exhibits increased lateral loss of balance with poor gluteal control due to weakness. Patient reinforced in HEP; Patient verbalized understanding of HEP;                     PT Education - 03/15/21 1112    Education provided Yes    Education Details balance/gait safety HEP     Person(s) Educated Patient    Methods Explanation;Verbal cues    Comprehension Verbalized understanding;Returned demonstration;Verbal cues required;Need further instruction            PT Short Term Goals - 12/28/20 1241      PT SHORT TERM GOAL #1   Title Pt will be indep with performing HEP 3x/week to improve LE tone and strength for improved safety with at home ambulation and ADL's.    Baseline 3/10: Given LE stretches    Time 6    Period Weeks    Status New    Target Date 02/08/21             PT Long Term Goals - 01/18/21 1208      PT LONG TERM GOAL #1   Title Patient will increase Berg Balance score by > 6 points to demonstrate decreased fall risk during functional activities.    Baseline 12/28/20: 32/56    Time 12    Period Weeks    Status New      PT LONG TERM GOAL #2   Title Pt will reduce 5xsSTS time to < 8 sec to demonstrate clinically significant improvement in LE strength and balance to reduce risk of falls.    Baseline 3/10: 10.33 sec no UE support    Time 12    Period Weeks    Status New      PT LONG TERM GOAL #3   Title Pt will improve 6 MWT to > 1000' with no AD and no LOB to demonstrate ability to improve safety with community distances.    Baseline 3/10: 510'    Time 12    Period Weeks    Status New      PT LONG TERM GOAL #4   Title Pt will improve 10 m gait speed with no AD > 1.0 m/s to demonstrate reduced risk of falls along with safe ability to complete community walking tasks.    Baseline 3/10: 0.78 m/s    Time 12    Period Weeks    Status New      PT LONG TERM GOAL #5   Title Pt will improve FOTO score to 64 demonstrate perceived improvements in ability to complete ADL's.    Baseline 3/10: Risk adjusted 51    Time 12    Period Weeks    Status New                 Plan - 03/15/21 1442    Clinical Impression Statement Patient late to session. Instructed patient in balance tasks working  on weight shift and stance control. She  continues to have increased hip adductor tone and hip abductor weakness which limits stance ability. She required min-mod A for balance control especially when unsupported. She often compensates with UE reach/trunk strategies with diminished hip/knee and ankle strategies. Patient would benefit from additional skilled PT Intervention to improve strength, balance and mobility;    Personal Factors and Comorbidities Comorbidity 2;Fitness;Past/Current Experience;Time since onset of injury/illness/exacerbation;Age    Comorbidities CP and anxiety;    Examination-Activity Limitations Bed Mobility    Examination-Participation Restrictions Church;Cleaning;Community Activity;Driving;Laundry;Medication Management;Meal Prep;Shop;Volunteer;Yard Work    Stability/Clinical Decision Making Unstable/Unpredictable    Rehab Potential Fair    PT Frequency 2x / week    PT Duration 12 weeks    PT Treatment/Interventions Patient/family education;Manual techniques;Therapeutic activities;Therapeutic exercise;Electrical Stimulation;Aquatic Therapy;DME Instruction;Functional mobility training;Gait training;Neuromuscular re-education;ADLs/Self Care Home Management;Cryotherapy;Moist Heat;Stair training;Orthotic Fit/Training;Energy conservation    PT Next Visit Plan LE stretching, balance, gait    PT Home Exercise Plan Long sitting hamstring stretch, prone quad stretch, figure 4/FABER stretch, standing/long sitting calf stretch    Consulted and Agree with Plan of Care Patient           Patient will benefit from skilled therapeutic intervention in order to improve the following deficits and impairments:  Abnormal gait,Decreased balance,Decreased endurance,Decreased mobility,Difficulty walking,Cardiopulmonary status limiting activity,Decreased activity tolerance,Decreased safety awareness,Decreased strength  Visit Diagnosis: Muscle weakness (generalized)  Unsteadiness on feet     Problem List Patient Active Problem List    Diagnosis Date Noted  . Loss of weight 07/17/2015  . Depression 07/17/2015  . Cerebral palsy (HCC) 07/17/2015    Olando Willems PT , DPT 03/15/2021, 2:44 PM  [place] [facility] MAIN [facility] SERVICES [address] [place], [place], [address] Phone: [phone]   Fax:  [phone]  Name: Shannel Zahm MRN: [id number] Date of Birth: 04-Mar-1996

## 2021-03-22 ENCOUNTER — Ambulatory Visit: Payer: Medicaid Other | Attending: Internal Medicine | Admitting: Physical Therapy

## 2021-03-22 ENCOUNTER — Encounter: Payer: Self-pay | Admitting: Physical Therapy

## 2021-03-22 ENCOUNTER — Other Ambulatory Visit: Payer: Self-pay

## 2021-03-22 DIAGNOSIS — G809 Cerebral palsy, unspecified: Secondary | ICD-10-CM | POA: Diagnosis present

## 2021-03-22 DIAGNOSIS — R2681 Unsteadiness on feet: Secondary | ICD-10-CM | POA: Diagnosis present

## 2021-03-22 DIAGNOSIS — M6281 Muscle weakness (generalized): Secondary | ICD-10-CM | POA: Diagnosis not present

## 2021-03-22 NOTE — Therapy (Signed)
[place] [facility] MAIN [facility] SERVICES [address] [place], [place], [address] Phone: [phone]   Fax:  [phone]  Physical Therapy Treatment  Patient Details  Name: Renee Walton MRN: [id number] Date of Birth: July 03, 1996 Referring Provider (PT): [name] MD   Encounter Date: 03/22/2021   PT End of Session - 03/22/21 1207    Visit Number 8    Number of Visits 25    Date for PT Re-Evaluation 03/22/21    Authorization Type Medicaid    Authorization Time Period authorized 12 visits 4/21-7/13    Authorization - Visit Number 8    Authorization - Number of Visits 10    PT Start Time 1124    PT Stop Time 1205    PT Time Calculation (min) 41 min    Equipment Utilized During Treatment Gait belt    Activity Tolerance Patient tolerated treatment well;No increased pain    Behavior During Therapy WFL for tasks assessed/performed           Past Medical History:  Diagnosis Date  . Breakthrough bleeding   . Cerebral palsy (HCC)   . GAD (generalized anxiety disorder)   . Migraine headache     Past Surgical History:  Procedure Laterality Date  . LEG SURGERY     bilateral x2 in 2008    There were no vitals filed for this visit.   Subjective Assessment - 03/22/21 1206    Subjective Patient reports doing well. No pain. Reports still going to the gym.    Pertinent History Pt returns to therapy after previous bout of CP. Pt states her major goal with therapy is to improve her mobility as much as possible before returning to [place] this upcoming September. Pt states [place] is not as ADA compliant for her as the [place] and wants to improve her walking and balance to feel more confident because she [place] not [place] walker on the airplane. Pt reports meeting with MD about possible foot alignment surgery on April 19th and her MD in [place] wants to perform a hip adductor release. 2 falls have occurred in last 6 months due to balance loss  but no injury and able to stand indep with use of UE's to helpherself up. Pt does not specify which direction her LOB occurs. Pt uses gym 2x/week for stretching and leg exercises and is motivated to improve her mobility.    Currently in Pain? No/denies    Multiple Pain Sites No             TREATMENT:  Gait outside: Progressed gait training to walking outside on unlevel surface  x100 feet x2 on grass while holding small soccer ball with min-mod A to avoid loss of balance;   x30 feet x2 on concrete with cues for directional awareness, with min A for safety   Side stepping in grass x30 feet x1 rep each direction with min-mod A for safety and 2 HHA, patient has most difficulty when side stepping to right side often rotating RLE into ER utilizing hip flexors for advancing foot;  Forward/backward walking x30 feet with mod A and short shuffled steps when walking backward;  Forward walking and then turning 180 degrees x4 reps with mod A for safety with slower turning and wide base turns requiring cues for weight shift to neutral for better stance control and balance;   When walking on grass patient required min- modA for gait safety with increased lateral trunk lean and instability  due to unstable surface;   Standing on grass: Feet apart: BUE ball pass side/side x5 reps Staggered stance holding BUE ball up/down lift x10 reps each foot in front  Required min A for safety with increased posterior loss of balance;  Standing on pine straw, holding ball in BUE, squatting down and tapping ground and then standing up and raising ball overhead x5 reps with min A for safety;    She was able to walk on concrete with CGA with reciprocal gait pattern, however required increased assistance when passing ball, increased toe out with increased lateral trunk lean due to hip abductor weakness. Patient exhibits increased posterior loss of balance this session;    She does require min A when changing surfaces  due to instability;  Tolerated well reporting minimal pain and minimal fatigue at end of session. While patient reports minimal fatigue, she did require increased assistance at end of session likely due to LE fatigue and decreased motor control;                      PT Education - 03/22/21 1206    Education provided Yes    Education Details balance/gait safety; HEP    Person(s) Educated Patient    Methods Explanation;Verbal cues    Comprehension Verbalized understanding;Returned demonstration;Verbal cues required;Need further instruction            PT Short Term Goals - 12/28/20 1241      PT SHORT TERM GOAL #1   Title Pt will be indep with performing HEP 3x/week to improve LE tone and strength for improved safety with at home ambulation and ADL's.    Baseline 3/10: Given LE stretches    Time 6    Period Weeks    Status New    Target Date 02/08/21             PT Long Term Goals - 01/18/21 1208      PT LONG TERM GOAL #1   Title Patient will increase Berg Balance score by > 6 points to demonstrate decreased fall risk during functional activities.    Baseline 12/28/20: 32/56    Time 12    Period Weeks    Status New      PT LONG TERM GOAL #2   Title Pt will reduce 5xsSTS time to < 8 sec to demonstrate clinically significant improvement in LE strength and balance to reduce risk of falls.    Baseline 3/10: 10.33 sec no UE support    Time 12    Period Weeks    Status New      PT LONG TERM GOAL #3   Title Pt will improve 6 MWT to > 1000' with no AD and no LOB to demonstrate ability to improve safety with community distances.    Baseline 3/10: 510'    Time 12    Period Weeks    Status New      PT LONG TERM GOAL #4   Title Pt will improve 10 m gait speed with no AD > 1.0 m/s to demonstrate reduced risk of falls along with safe ability to complete community walking tasks.    Baseline 3/10: 0.78 m/s    Time 12    Period Weeks    Status New      PT LONG  TERM GOAL #5   Title Pt will improve FOTO score to 64 demonstrate perceived improvements in ability to complete ADL's.    Baseline 3/10:  Risk adjusted 51    Time 12    Period Weeks    Status New                 Plan - 03/22/21 1207    Clinical Impression Statement Patient late to session; She was instructed in advanced gait tasks working on dynamic balance on unlevel surfaces. Patient required min-mod A and intermittent hand hold assist for balance while on thick grass. She often loses balance posteriorly requiring assistance to reorient to midline. Patient has decreased gluteal activation with increased upper trunk lateral lean to assist with balance when walking. She expressed desire to work on improving ability to tie hair in pony tail. Recommend OT screen to assess needs for hair maintenance. She would benefit from additional skilled PT intervention to improve strength, balance and mobility; Patient is scheduled to see surgeon on 6/6 to assess for need for hip adductor release;    Personal Factors and Comorbidities Comorbidity 2;Fitness;Past/Current Experience;Time since onset of injury/illness/exacerbation;Age    Comorbidities CP and anxiety;    Examination-Activity Limitations Bed Mobility    Examination-Participation Restrictions Church;Cleaning;Community Activity;Driving;Laundry;Medication Management;Meal Prep;Shop;Volunteer;Yard Work    Stability/Clinical Decision Making Unstable/Unpredictable    Rehab Potential Fair    PT Frequency 2x / week    PT Duration 12 weeks    PT Treatment/Interventions Patient/family education;Manual techniques;Therapeutic activities;Therapeutic exercise;Electrical Stimulation;Aquatic Therapy;DME Instruction;Functional mobility training;Gait training;Neuromuscular re-education;ADLs/Self Care Home Management;Cryotherapy;Moist Heat;Stair training;Orthotic Fit/Training;Energy conservation    PT Next Visit Plan LE stretching, balance, gait    PT Home  Exercise Plan Long sitting hamstring stretch, prone quad stretch, figure 4/FABER stretch, standing/long sitting calf stretch    Consulted and Agree with Plan of Care Patient           Patient will benefit from skilled therapeutic intervention in order to improve the following deficits and impairments:  Abnormal gait,Decreased balance,Decreased endurance,Decreased mobility,Difficulty walking,Cardiopulmonary status limiting activity,Decreased activity tolerance,Decreased safety awareness,Decreased strength  Visit Diagnosis: Muscle weakness (generalized)  Unsteadiness on feet     Problem List Patient Active Problem List   Diagnosis Date Noted  . Loss of weight 07/17/2015  . Depression 07/17/2015  . Cerebral palsy (HCC) 07/17/2015    Clotine Heiner PT, DPT 03/22/2021, 12:10 PM  [place] [facility] MAIN [facility] SERVICES [address] [place], [place], [address] Phone: [phone]   Fax:  [phone]  Name: Renee Walton MRN: [id number] Date of Birth: October 05, 1996

## 2021-03-29 ENCOUNTER — Ambulatory Visit: Payer: Medicaid Other | Admitting: Physical Therapy

## 2021-04-05 ENCOUNTER — Encounter: Payer: Self-pay | Admitting: Physical Therapy

## 2021-04-05 ENCOUNTER — Other Ambulatory Visit: Payer: Self-pay

## 2021-04-05 ENCOUNTER — Ambulatory Visit: Payer: Medicaid Other | Admitting: Physical Therapy

## 2021-04-05 DIAGNOSIS — M6281 Muscle weakness (generalized): Secondary | ICD-10-CM | POA: Diagnosis not present

## 2021-04-05 DIAGNOSIS — R2681 Unsteadiness on feet: Secondary | ICD-10-CM

## 2021-04-05 NOTE — Therapy (Signed)
[place] MAIN [facility] SERVICES [address] [place], [place], [address] Phone: [phone]   Fax:  [phone]  Physical Therapy Treatment    Patient Details  Name: Renee Walton MRN: [id number] Date of Birth: [birth year] Referring Provider (PT): Perrin Maltese MD   Encounter Date: 04/05/2021   PT End of Session - 04/05/21 1116     Visit Number 9    Number of Visits 25    Date for PT Re-Evaluation 06/28/21    Authorization Type Medicaid    Authorization Time Period authorized 12 visits 4/21-7/13    Authorization - Visit Number 9    Authorization - Number of Visits 10    PT Start Time 1110    PT Stop Time 1145    PT Time Calculation (min) 35 min    Equipment Utilized During Treatment Gait belt    Activity Tolerance Patient tolerated treatment well;No increased pain    Behavior During Therapy WFL for tasks assessed/performed             Past Medical History:  Diagnosis Date   Breakthrough bleeding    Cerebral palsy (HCC)    GAD (generalized anxiety disorder)    Migraine headache     Past Surgical History:  Procedure Laterality Date   LEG SURGERY     bilateral x2 in 2008    There were no vitals filed for this visit.   Subjective Assessment - 04/05/21 1115     Subjective Patient reports doing well. No pain. Reports still going to the gym; Hasn't had new falls. Patient did go and see surgeon and they recommended MRI to assess brain function to determine if surgery is better or botox injection;    Pertinent History Pt returns to therapy after previous bout of CP. Pt states her major goal with therapy is to improve her mobility as much as possible before returning to [place] this upcoming September. Pt states [place] is not as ADA compliant for her as the [place] and wants to improve her walking and balance to feel more confident because she can not bring her walker on the airplane. Pt reports meeting with MD about  possible foot alignment surgery on April 19th and her MD in [place] wants to perform a hip adductor release. 2 falls have occurred in last 6 months due to balance loss but no injury and able to stand indep with use of UE's to helpherself up. Pt does not specify which direction her LOB occurs. Pt uses gym 2x/week for stretching and leg exercises and is motivated to improve her mobility.    Currently in Pain? No/denies    Multiple Pain Sites No                [facility] PT Assessment - 04/05/21 0001       6 minute walk test results    Aerobic Endurance Distance Walked 920    Endurance additional comments with close supervision, limited community ambulator      Standardized Balance Assessment   Standardized Balance Assessment 10 meter walk test    Five times sit to stand comments  6 sec with arms across chest however not fully standing up; then able to complete with full stand at 12 sec with arms across chest    10 Meter Walk 0.84 m/s without AD, limited home ambulator, increased risk for falls;      Berg Balance Test   Sit to Stand Able to stand without using  hands and stabilize independently    Standing Unsupported Able to stand 2 minutes with supervision    Sitting with Back Unsupported but Feet Supported on Floor or Stool Able to sit safely and securely 2 minutes    Stand to Sit Sits safely with minimal use of hands    Transfers Able to transfer safely, definite need of hands    Standing Unsupported with Eyes Closed Able to stand 10 seconds safely    Standing Unsupported with Feet Together Needs help to attain position but able to stand for 30 seconds with feet together    From Standing, Reach Forward with Outstretched Arm Can reach confidently >25 cm (10")    From Standing Position, Pick up Object from Floor Able to pick up shoe, needs supervision    From Standing Position, Turn to Look Behind Over each Shoulder Looks behind one side only/other side shows less weight shift    Turn 360  Degrees Needs assistance while turning    Standing Unsupported, Alternately Place Feet on Step/Stool Needs assistance to keep from falling or unable to try    Standing Unsupported, One Foot in [employer] balance while stepping or standing    Standing on One Leg Unable to try or needs assist to prevent fall    Total Score 33    Berg comment: 100% risk for falls, slight improvement from 01/18/21 which was 32/56                TREATMENT: Patient late to session  Instructed patient in outcome measures to address progress towards goals, see above;   Patient does exhibit improvement in gait ability although continues to require intermittent HHA for balance especially during turns or negotiating obstacles. She does test as a high risk for falls;                    PT Education - 04/05/21 1115     Education provided Yes    Education Details progress towards goals;    Person(s) Educated Patient    Methods Explanation    Comprehension Verbalized understanding              PT Short Term Goals - 04/05/21 1117       PT SHORT TERM GOAL #1   Title Pt will be indep with performing HEP 3x/week to improve LE tone and strength for improved safety with at home ambulation and ADL's.    Baseline 3/10: Given LE stretches, 6/16: going to gym 1x a week, doing stretches 1x a week;    Time 6    Period Weeks    Status Not Met    Target Date 02/08/21               PT Long Term Goals - 04/05/21 1118       PT LONG TERM GOAL #1   Title Patient will increase Berg Balance score by > 6 points to demonstrate decreased fall risk during functional activities.    Baseline 12/28/20: 32/56 6/16:33/56    Time 12    Period Weeks    Status On-going    Target Date 06/28/21      PT LONG TERM GOAL #2   Title Pt will reduce 5xsSTS time to < 8 sec to demonstrate clinically significant improvement in LE strength and balance to reduce risk of falls.    Baseline 3/10: 10.33 sec no UE  support, 6/16: 6 sec with arms across chest (partial ROM), but  requires 12 sec for full sit to stand;    Time 12    Period Weeks    Status Partially Met    Target Date 06/28/21      PT LONG TERM GOAL #3   Title Pt will improve 6 MWT to > 1000' with no AD and no LOB to demonstrate ability to improve safety with community distances.    Baseline 3/10: 510', 6/16: 920 feet with CGA    Time 12    Period Weeks    Status Partially Met    Target Date 06/28/21      PT LONG TERM GOAL #4   Title Pt will improve 10 m gait speed with no AD > 1.0 m/s to demonstrate reduced risk of falls along with safe ability to complete community walking tasks.    Baseline 3/10: 0.78 m/s, 6/16: 0.84 m/s    Time 12    Period Weeks    Status Partially Met    Target Date 06/28/21      PT LONG TERM GOAL #5   Title Pt will improve FOTO score to 64 demonstrate perceived improvements in ability to complete ADL's.    Baseline 3/10: Risk adjusted 51    Time 12    Period Weeks    Status On-going    Target Date 06/28/21                   Plan - 04/05/21 1302     Clinical Impression Statement Patient late to session; She reports she is having issues with her caregivers bringing her on time. Patient instructed in outcome measures to address progress towards goals. Patient has made improvements in gait ability. She also exhibits improvement in sit<>Stand transfers. Although she does take a longer time for transfers when working on control and balance. Patient doesn't exhibit a significant change in balance score. She is still considered a high risk for falls. She reports adherence to gym 1x a week. Patient would benefit from additional skilled PT Intervention to improve strength, balance and mobility;    Personal Factors and Comorbidities Comorbidity 2;Fitness;Past/Current Experience;Time since onset of injury/illness/exacerbation;Age    Comorbidities CP and anxiety;    Examination-Activity Limitations Bed  Mobility    Examination-Participation Restrictions Church;Cleaning;Community Activity;Driving;Laundry;Medication Management;Meal Prep;Shop;Volunteer;Yard Work    Stability/Clinical Decision Making Unstable/Unpredictable    Rehab Potential Fair    PT Frequency 1x / week    PT Duration 12 weeks    PT Treatment/Interventions Patient/family education;Manual techniques;Therapeutic activities;Therapeutic exercise;Electrical Stimulation;Aquatic Therapy;DME Instruction;Functional mobility training;Gait training;Neuromuscular re-education;ADLs/Self Care Home Management;Cryotherapy;Moist Heat;Stair training;Orthotic Fit/Training;Energy conservation    PT Next Visit Plan LE stretching, balance, gait    PT Home Exercise Plan Long sitting hamstring stretch, prone quad stretch, figure 4/FABER stretch, standing/long sitting calf stretch    Consulted and Agree with Plan of Care Patient             Patient will benefit from skilled therapeutic intervention in order to improve the following deficits and impairments:  Abnormal gait, Decreased balance, Decreased endurance, Decreased mobility, Difficulty walking, Cardiopulmonary status limiting activity, Decreased activity tolerance, Decreased safety awareness, Decreased strength  Visit Diagnosis: Muscle weakness (generalized)  Unsteadiness on feet     Problem List Patient Active Problem List   Diagnosis Date Noted   Loss of weight 07/17/2015   Depression 07/17/2015   Cerebral palsy ([place]) 07/17/2015    Caillou Minus PT, DPT 04/05/2021, 3:26 PM  [place] [facility] SERVICES [address]  [place], [place], [address] Phone: [phone]   Fax:  [phone]  Name: Renee Walton MRN: [id number] Date of Birth: 1995/12/05

## 2021-04-12 ENCOUNTER — Ambulatory Visit: Payer: Medicaid Other | Admitting: Physical Therapy

## 2021-04-19 ENCOUNTER — Other Ambulatory Visit: Payer: Self-pay

## 2021-04-19 ENCOUNTER — Ambulatory Visit: Payer: Medicaid Other | Admitting: Occupational Therapy

## 2021-04-19 ENCOUNTER — Ambulatory Visit: Payer: Medicaid Other

## 2021-04-19 DIAGNOSIS — G809 Cerebral palsy, unspecified: Secondary | ICD-10-CM

## 2021-04-19 DIAGNOSIS — M6281 Muscle weakness (generalized): Secondary | ICD-10-CM | POA: Diagnosis not present

## 2021-04-19 DIAGNOSIS — R2681 Unsteadiness on feet: Secondary | ICD-10-CM

## 2021-04-19 NOTE — Therapy (Signed)
[place] MAIN [facility] SERVICES [address] [place], [place], [address] Phone: [phone]   Fax:  [phone]  Physical Therapy Treatment Physical Therapy Progress Note   Dates of reporting period  12/28/20   to   04/19/21   Patient Details  Name: Renee Walton MRN: [id number] Date of Birth: 11-Aug-1996 Referring Provider (PT): Perrin Maltese MD   Encounter Date: 04/19/2021   PT End of Session - 04/19/21 1143     Visit Number 10    Number of Visits 25    Date for PT Re-Evaluation 06/28/21    Authorization Type Medicaid (27 annual visit PT/OT)    Authorization Time Period authorized 12 visits 4/21-7/13    Authorization - Visit Number 6    Authorization - Number of Visits 12    PT Start Time 1115   pt arrived late; transportation difficulty   PT Stop Time 1145    PT Time Calculation (min) 30 min    Equipment Utilized During Treatment Gait belt    Activity Tolerance Patient tolerated treatment well;No increased pain    Behavior During Therapy WFL for tasks assessed/performed             Past Medical History:  Diagnosis Date   Breakthrough bleeding    Cerebral palsy (HCC)    GAD (generalized anxiety disorder)    Migraine headache     Past Surgical History:  Procedure Laterality Date   LEG SURGERY     bilateral x2 in 2008    There were no vitals filed for this visit.   Subjective Assessment - 04/19/21 1112     Subjective Pt reports things are going well. No medical update, no medication changes. Still waiting on brain MRI in July and waiting to hear about traveling to [place] in August. Workouts at gym continue to go well. No falls, no close calls since prior viist.    Patient is accompained by: Family member    Pertinent History Pt returns to therapy after previous bout of CP. Pt states her major goal with therapy is to improve her mobility as much as possible before returning to [place] this upcoming September. Pt states  [place] is not as ADA compliant for her as the [place] and wants to improve her walking and balance to feel more confident because she can not bring her walker on the airplane. Pt reports meeting with MD about possible foot alignment surgery on April 19th and her MD in [place] wants to perform a hip adductor release. 2 falls have occurred in last 6 months due to balance loss but no injury and able to stand indep with use of UE's to helpherself up. Pt does not specify which direction her LOB occurs. Pt uses gym 2x/week for stretching and leg exercises and is motivated to improve her mobility.    Currently in Pain? No/denies                [facility] PT Assessment - 04/19/21 0001       Observation/Other Assessments   Focus on Therapeutic Outcomes (FOTO)  78             INTERVENTION THIS DATE: -168f overground AMB warm-up, minguard assist with gait belt, no LOB  -Variable gait training obstacle course overground, indoors (includes red foam mat simulation of compliant surfaces, cardio-step simulation of curb step, and cone weaving)  *requires very close minGuard assist with recurrent MinA for trunk righting in sagittal plane due to  LOB posterior, and maxA x1 assist after forward LOB catching Rt toes on edge of mat) Pt has significant difficulty AMB on red mat, revealing extent of imbalance between calf tone and ankle DF insufficiency with conitnued difficulty returning COM forward over BOS, recurrent retropulsive sway past point of control. Pt utilizes BUE thrusting as a righting mechanism, but is oftne unsuccessful.  Question whether a semi-rigid AFO would provide anout support to allow for better ankle control in sagittal plane.   -STS from elevated mat, 1x15 hands free (LOB ~30% of instances back into sitting)          PT Short Term Goals - 04/05/21 1117       PT SHORT TERM GOAL #1   Title Pt will be indep with performing HEP 3x/week to improve LE tone and strength for  improved safety with at home ambulation and ADL's.    Baseline 3/10: Given LE stretches, 6/16: going to gym 1x a week, doing stretches 1x a week;    Time 6    Period Weeks    Status Not Met    Target Date 02/08/21               PT Long Term Goals - 04/19/21 1243       PT LONG TERM GOAL #1   Title Patient will increase Berg Balance score by > 6 points to demonstrate decreased fall risk during functional activities.    Baseline 12/28/20: 32/56 6/16:33/56    Time 12    Period Weeks    Status On-going    Target Date 06/28/21      PT LONG TERM GOAL #2   Title Pt will reduce 5xsSTS time to < 8 sec to demonstrate clinically significant improvement in LE strength and balance to reduce risk of falls.    Baseline 3/10: 10.33 sec no UE support, 6/16: 6 sec with arms across chest (partial ROM), but requires 12 sec for full sit to stand;    Time 12    Period Weeks    Status Partially Met    Target Date 06/28/21      PT LONG TERM GOAL #3   Title Pt will improve 6 MWT to > 1000' with no AD and no LOB to demonstrate ability to improve safety with community distances.    Baseline 3/10: 510', 6/16: 920 feet with CGA    Time 12    Period Weeks    Status Partially Met    Target Date 06/28/21      PT LONG TERM GOAL #4   Title Pt will improve 10 m gait speed with no AD > 1.0 m/s to demonstrate reduced risk of falls along with safe ability to complete community walking tasks.    Baseline 3/10: 0.78 m/s, 6/16: 0.84 m/s    Time 12    Period Weeks    Status Partially Met    Target Date 06/28/21      PT LONG TERM GOAL #5   Title Pt will improve FOTO score to 64 demonstrate perceived improvements in ability to complete ADL's.    Baseline 3/10: Risk adjusted 51; 04/19/21: 59    Time 12    Period Weeks    Status On-going    Target Date 06/28/21                   Plan - 04/19/21 1242     Clinical Impression Statement Pt reassessed last visit, showing good progress toward most  treatment goals. This date, FOTO survey repeated showing improvement toward goal. Continued with current plan of care as laid out in evaluation and recent prior sessions. Pt remains motivated to advance progress toward goals. Pt given extensive physical assist to perform advanced gait training. Only 1 full out LOB in session catching her rt foot on mat, otherwise >25x loss of anterior compartment driven righting strategies, most often when on soft surfaces. Pt continues to demonstrate progress toward goals AEB progression of some interventions this date either in volume or intensity.   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    Surveyor, mining    Rehab Potential Fair    PT Frequency 1x / week    PT Duration 12 weeks    PT Treatment/Interventions Patient/family education;Manual techniques;Therapeutic activities;Therapeutic exercise;Electrical Stimulation;Aquatic Therapy;DME Instruction;Functional mobility training;Gait training;Neuromuscular re-education;ADLs/Self Care Home Management;Cryotherapy;Moist Heat;Stair training;Orthotic Fit/Training;Energy conservation    PT Next Visit Plan LE stretching, balance, gait    PT Home Exercise Plan Long sitting hamstring stretch, prone quad stretch, figure 4/FABER stretch, standing/long sitting calf stretch ( no updates this visit)    Consulted and Agree with Plan of Care Patient             Patient will benefit from skilled therapeutic intervention in order to improve the following deficits and impairments:  Abnormal gait, Decreased balance, Decreased endurance,  Decreased mobility, Difficulty walking, Cardiopulmonary status limiting activity, Decreased activity tolerance, Decreased safety awareness, Decreased strength  Visit Diagnosis: Muscle weakness (generalized)  Unsteadiness on feet  Cerebral palsy, unspecified type [facility])     Problem List Patient Active Problem List   Diagnosis Date Noted   Loss of weight 07/17/2015   Depression 07/17/2015   Cerebral palsy ([place]) 07/17/2015   1:08 PM, 04/19/21 Etta Grandchild, PT, DPT Physical Therapist - [facility]  [phone] ([address]    [place] 04/19/2021, 12:49 PM  [place] MAIN [facility] SERVICES [address] [place], [place], [address] Phone: [phone]   Fax:  [phone]  Name: Renee Walton MRN: [id number] Date of Birth: 1996/08/22

## 2021-04-26 ENCOUNTER — Ambulatory Visit: Payer: Medicaid Other | Attending: Internal Medicine

## 2021-04-26 ENCOUNTER — Other Ambulatory Visit: Payer: Self-pay

## 2021-04-26 DIAGNOSIS — G809 Cerebral palsy, unspecified: Secondary | ICD-10-CM | POA: Diagnosis present

## 2021-04-26 DIAGNOSIS — M6281 Muscle weakness (generalized): Secondary | ICD-10-CM | POA: Diagnosis not present

## 2021-04-26 DIAGNOSIS — R2681 Unsteadiness on feet: Secondary | ICD-10-CM | POA: Diagnosis present

## 2021-04-26 NOTE — Therapy (Signed)
[place] MAIN [facility] SERVICES [address] [place], [place], [address] Phone: [phone]   Fax:  [phone]  Physical Therapy Treatment  Patient Details  Name: Renee Walton MRN: [id number] Date of Birth: 11/07/1995 Referring Provider (PT): Perrin Maltese MD   Encounter Date: 04/26/2021   PT End of Session - 04/26/21 1132     Visit Number 11    Number of Visits 25    Date for PT Re-Evaluation 06/28/21    Authorization Type Medicaid (27 annual visit PT/OT)    Authorization Time Period authorized 12 visits 4/21-7/13    PT Start Time 1117    PT Stop Time 1157    PT Time Calculation (min) 40 min    Equipment Utilized During Treatment Gait belt    Activity Tolerance Patient tolerated treatment well;No increased pain    Behavior During Therapy WFL for tasks assessed/performed             Past Medical History:  Diagnosis Date   Breakthrough bleeding    Cerebral palsy (HCC)    GAD (generalized anxiety disorder)    Migraine headache     Past Surgical History:  Procedure Laterality Date   LEG SURGERY     bilateral x2 in 2008    There were no vitals filed for this visit.   Subjective Assessment - 04/26/21 1131     Subjective Pt doing well today, no updates, no falls since last visit. No pain today.    Pertinent History Pt returns to therapy after previous bout of CP. Pt states her major goal with therapy is to improve her mobility as much as possible before returning to [place] this upcoming September. Pt states [place] is not as ADA compliant for her as the [place] and wants to improve her walking and balance to feel more confident because she can not bring her walker on the airplane. Pt reports meeting with MD about possible foot alignment surgery on April 19th and her MD in [place] wants to perform a hip adductor release. 2 falls have occurred in last 6 months due to balance loss but no injury and able to stand indep with  use of UE's to helpherself up. Pt does not specify which direction her LOB occurs. Pt uses gym 2x/week for stretching and leg exercises and is motivated to improve her mobility.    Currently in Pain? No/denies             INTERVENTION THIS DATE: -AMB overground, no AD, minGuard assist for safety albeit no LOB sustained  *2x356f c rest between (clockwise, then CCW) *baseline motor patterns, RLE toe out due to adductor dominant swing phase, heavy bilateral lateral trunk movement in frontal plane for ergonomic swing phase and foot clearance, flatfoot strike bilat with only minimal ankle PF at toe-off   -Lateral side stepping attempt (maintains most of body from pelvis down aligned with line of progress, DC after 5 ft) -AMB circles around chair in hallway, no UE assist 4x each direction -Retro AMB in hallway, minguard assist, no device, [id number] ~2 minutes, 1 LOB c supervision level LLE forward step righting  - AMB 15083f 2.5lb AW, 180degree turn to left halfway -STS from chair, BUE support 1x15        PT Short Term Goals - 04/05/21 1117       PT SHORT TERM GOAL #1   Title Pt will be indep with performing HEP 3x/week to improve LE tone and  strength for improved safety with at home ambulation and ADL's.    Baseline 3/10: Given LE stretches, 6/16: going to gym 1x a week, doing stretches 1x a week;    Time 6    Period Weeks    Status Not Met    Target Date 02/08/21               PT Long Term Goals - 04/19/21 1243       PT LONG TERM GOAL #1   Title Patient will increase Berg Balance score by > 6 points to demonstrate decreased fall risk during functional activities.    Baseline 12/28/20: 32/56 6/16:33/56    Time 12    Period Weeks    Status On-going    Target Date 06/28/21      PT LONG TERM GOAL #2   Title Pt will reduce 5xsSTS time to < 8 sec to demonstrate clinically significant improvement in LE strength and balance to reduce risk of falls.    Baseline 3/10: 10.33  sec no UE support, 6/16: 6 sec with arms across chest (partial ROM), but requires 12 sec for full sit to stand;    Time 12    Period Weeks    Status Partially Met    Target Date 06/28/21      PT LONG TERM GOAL #3   Title Pt will improve 6 MWT to > 1000' with no AD and no LOB to demonstrate ability to improve safety with community distances.    Baseline 3/10: 510', 6/16: 920 feet with CGA    Time 12    Period Weeks    Status Partially Met    Target Date 06/28/21      PT LONG TERM GOAL #4   Title Pt will improve 10 m gait speed with no AD > 1.0 m/s to demonstrate reduced risk of falls along with safe ability to complete community walking tasks.    Baseline 3/10: 0.78 m/s, 6/16: 0.84 m/s    Time 12    Period Weeks    Status Partially Met    Target Date 06/28/21      PT LONG TERM GOAL #5   Title Pt will improve FOTO score to 64 demonstrate perceived improvements in ability to complete ADL's.    Baseline 3/10: Risk adjusted 51; 04/19/21: 59    Time 12    Period Weeks    Status On-going    Target Date 06/28/21                   Plan - 04/26/21 1132     Clinical Impression Statement Continued with current plan of care as laid out in evaluation and recent prior sessions. Pt remains motivated to advance progress toward goals. Session heavy focused on maximizing stepping activity in a variety of settings/challenges in order to promote improve independence and safety in ADL/IADL mobility.Rest breaks provided as needed, but based on when pt begins to demonstrate fatigue related deterioration in motor control. Pt continues to demonstrate progress toward goals AEB progression of some interventions this date either in volume or intensity.   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    Surveyor, mining    Rehab Potential Fair    PT Frequency 1x / week  PT Duration 12 weeks    PT Treatment/Interventions Patient/family education;Manual techniques;Therapeutic activities;Therapeutic exercise;Electrical Stimulation;Aquatic Therapy;DME Instruction;Functional mobility training;Gait training;Neuromuscular re-education;ADLs/Self Care Home Management;Cryotherapy;Moist Heat;Stair training;Orthotic Fit/Training;Energy conservation    PT Next Visit Plan LE stretching, balance, gait    PT Home Exercise Plan Long sitting hamstring stretch, prone quad stretch, figure 4/FABER stretch, standing/long sitting calf stretch ( no updates this visit)    Consulted and Agree with Plan of Care Patient             Patient will benefit from skilled therapeutic intervention in order to improve the following deficits and impairments:  Abnormal gait, Decreased balance, Decreased endurance, Decreased mobility, Difficulty walking, Cardiopulmonary status limiting activity, Decreased activity tolerance, Decreased safety awareness, Decreased strength  Visit Diagnosis: Muscle weakness (generalized)  Unsteadiness on feet  Cerebral palsy, unspecified type [facility])     Problem List Patient Active Problem List   Diagnosis Date Noted   Loss of weight 07/17/2015   Depression 07/17/2015   Cerebral palsy ([place]) 07/17/2015   12:26 PM, 04/26/21 Etta Grandchild, PT, DPT Physical Therapist - [place] [phone]     [place] [employer] [facility] 04/26/2021, 11:40 AM  [place] [address] [place], [place], [address] Phone: [phone]   Fax:  [phone]  Name: Renee Walton MRN:  [id number] Date of Birth: [birth year]

## 2021-05-02 ENCOUNTER — Ambulatory Visit: Payer: Medicaid Other | Admitting: Physical Therapy

## 2021-05-09 ENCOUNTER — Ambulatory Visit: Payer: Medicaid Other | Admitting: Physical Therapy

## 2021-05-09 ENCOUNTER — Other Ambulatory Visit: Payer: Self-pay

## 2021-05-09 ENCOUNTER — Encounter: Payer: Self-pay | Admitting: Physical Therapy

## 2021-05-09 DIAGNOSIS — M6281 Muscle weakness (generalized): Secondary | ICD-10-CM

## 2021-05-09 DIAGNOSIS — R2681 Unsteadiness on feet: Secondary | ICD-10-CM

## 2021-05-09 NOTE — Therapy (Signed)
[place] MAIN [facility] SERVICES [address] [place], [place], [address] Phone: [phone]   Fax:  [phone]  Physical Therapy Treatment  Patient Details  Name: Renee Walton MRN: [id number] Date of Birth: August 18, 1996 Referring Provider (PT): Perrin Maltese MD   Encounter Date: 05/09/2021   PT End of Session - 05/09/21 1131     Visit Number 12    Number of Visits 25    Date for PT Re-Evaluation 06/28/21    Authorization Type Medicaid (27 annual visit PT/OT)    Authorization Time Period authorized 12 visits 7/20-10/11    Authorization - Visit Number 1    Authorization - Number of Visits 12    PT Start Time 1130    PT Stop Time 1215    PT Time Calculation (min) 45 min    Equipment Utilized During Treatment Gait belt    Activity Tolerance Patient tolerated treatment well;No increased pain    Behavior During Therapy WFL for tasks assessed/performed             Past Medical History:  Diagnosis Date   Breakthrough bleeding    Cerebral palsy (HCC)    GAD (generalized anxiety disorder)    Migraine headache     Past Surgical History:  Procedure Laterality Date   LEG SURGERY     bilateral x2 in 2008    There were no vitals filed for this visit.   Subjective Assessment - 05/09/21 1133     Subjective Patient reports doing well. She has started at a new gym/[employer], going 2x a week. She is working on getting her mom as a caregiver so that she has more help. 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 Aide is doing stretches 1x a week; Reports 2-3 new falls    Pertinent History Pt returns to therapy after previous bout of CP. Pt states her major goal with therapy is to improve her mobility as much as possible before returning to [place] this upcoming September. Pt states [place] is not as ADA compliant for her as the [place] and wants to improve her walking and balance to feel more confident because she can not bring her walker  on the airplane. Pt reports meeting with MD about possible foot alignment surgery on April 19th and her MD in [place] wants to perform a hip adductor release. 2 falls have occurred in last 6 months due to balance loss but no injury and able to stand indep with use of UE's to helpherself up. Pt does not specify which direction her LOB occurs. Pt uses gym 2x/week for stretching and leg exercises and is motivated to improve her mobility.    Currently in Pain? Yes    Pain Score 3     Pain Location Leg    Pain Orientation Right;Left    Pain Descriptors / Indicators Aching;Sore;Tightness    Pain Type Chronic pain    Pain Onset More than a month ago    Pain Frequency Intermittent    Pain Relieving Factors massage chair    Multiple Pain Sites No                   NMR:   Standing on airex: -feet close together: unsupported static standing 30 sec hold x3 reps, intermittent rail assist;  -feet close together eyes closed 10 sec hold x2 reps with min A and intermittent rail assist;  -Standing one foot on airex, one foot on 4 inch step:  Unsupported standing 15 sec hold x2 reps each foot on step;  Patient has increased difficulty with staggered stance due to weakness in gluteal musc. Often losing balance laterally and leaning with upper trunk for balance recovery;   In parallel bars: 90 degree turns from one airex to another airex x5 reps each direction; utilized airex to challenge stance control but to also provide visual cues for increased step length. She required mod VCS to increase step length for better foot clearance; Pt able to take bigger step with LLE, significant difficulty when stepping with RLE  Standing on firm surface: 90 degree turns unsupported x2 reps with min a with increased loss of balance  Gait on level surface with turns x2 reps with min A for safety; Patient continues to exhibit increased lateral trunk sway due to poor abductor control;  Backward walking x10  feet with min A for safety, increased lateral trunk lean and decrease step length noted due to weakness in posterior hip musculature   Ex: PT performed passive stretches at end of session to facilitate improved LE flexibility:  Supine: SLR hamstring stretch 30 sec hold x2 reps with ankle DF to facilitate better calf stretch; PT provided pressure to anterior thigh of contralateral LE to disassociate LE for better ROM -BLE hip adductor stretch 30 sec hold x3 reps to tolerance  Sidelying Quad stretch 20 sec hold x2 reps bilaterally;  Patient instructed in long sitting leaning forward hamstring stretch; She requires support on table for trunk control but able to keep knee extended for better hamstring stretch;   Patient required min-mod A for most balance exercise, and cues for weight shift for better stance control; Pt exhibits increased lateral loss of balance with poor gluteal control due to weakness. Patient reinforced in HEP; Patient verbalized understanding of HEP;                           PT Education - 05/09/21 1137     Education provided Yes    Education Details gait safety/balance;    Person(s) Educated Patient    Methods Explanation;Verbal cues    Comprehension Verbalized understanding;Returned demonstration;Verbal cues required;Need further instruction              PT Short Term Goals - 04/05/21 1117       PT SHORT TERM GOAL #1   Title Pt will be indep with performing HEP 3x/week to improve LE tone and strength for improved safety with at home ambulation and ADL's.    Baseline 3/10: Given LE stretches, 6/16: going to gym 1x a week, doing stretches 1x a week;    Time 6    Period Weeks    Status Not Met    Target Date 02/08/21               PT Long Term Goals - 04/19/21 1243       PT LONG TERM GOAL #1   Title Patient will increase Berg Balance score by > 6 points to demonstrate decreased fall risk during functional activities.     Baseline 12/28/20: 32/56 6/16:33/56    Time 12    Period Weeks    Status On-going    Target Date 06/28/21      PT LONG TERM GOAL #2   Title Pt will reduce 5xsSTS time to < 8 sec to demonstrate clinically significant improvement in LE strength and balance to reduce risk of falls.    Baseline  3/10: 10.33 sec no UE support, 6/16: 6 sec with arms across chest (partial ROM), but requires 12 sec for full sit to stand;    Time 12    Period Weeks    Status Partially Met    Target Date 06/28/21      PT LONG TERM GOAL #3   Title Pt will improve 6 MWT to > 1000' with no AD and no LOB to demonstrate ability to improve safety with community distances.    Baseline 3/10: 510', 6/16: 920 feet with CGA    Time 12    Period Weeks    Status Partially Met    Target Date 06/28/21      PT LONG TERM GOAL #4   Title Pt will improve 10 m gait speed with no AD > 1.0 m/s to demonstrate reduced risk of falls along with safe ability to complete community walking tasks.    Baseline 3/10: 0.78 m/s, 6/16: 0.84 m/s    Time 12    Period Weeks    Status Partially Met    Target Date 06/28/21      PT LONG TERM GOAL #5   Title Pt will improve FOTO score to 64 demonstrate perceived improvements in ability to complete ADL's.    Baseline 3/10: Risk adjusted 51; 04/19/21: 59    Time 12    Period Weeks    Status On-going    Target Date 06/28/21                   Plan - 05/09/21 1224     Clinical Impression Statement Patient motivated and participated well within session. She was instructed in advanced balance tasks. She requires intermittent rail assist when standing unsupported after 15 sec with increased loss of balance. Patient uses upper trunk movement to help stabilize due to weakness and poor motor control in LE. She was instructed in 90 degree turning to challenge step length and dynamic balance. Patient able to exhibit better step length with LLE but has significant difficulty taking large step with  RLE. She requires min A for safety when turning on firm surface. PT performed passive stretches at end of session to reduce stiffness and improve ROM. Patient has difficulty stretching self due to increased tone, weakness and poor trunk control. She would benefit from additional skilled PT Intervention to improve strength, balance and mobility;    Personal Factors and Comorbidities Comorbidity 2;Fitness;Past/Current Experience;Time since onset of injury/illness/exacerbation;Age    Comorbidities CP and anxiety;    Examination-Activity Limitations Bed Mobility    Examination-Participation Restrictions Church;Cleaning;Community Activity;Driving;Laundry;Medication Management;Meal Prep;Shop;Volunteer;Yard Work    Stability/Clinical Decision Making Unstable/Unpredictable    Rehab Potential Fair    PT Frequency 1x / week    PT Duration 12 weeks    PT Treatment/Interventions Patient/family education;Manual techniques;Therapeutic activities;Therapeutic exercise;Electrical Stimulation;Aquatic Therapy;DME Instruction;Functional mobility training;Gait training;Neuromuscular re-education;ADLs/Self Care Home Management;Cryotherapy;Moist Heat;Stair training;Orthotic Fit/Training;Energy conservation    PT Next Visit Plan LE stretching, balance, gait    PT Home Exercise Plan Long sitting hamstring stretch, prone quad stretch, figure 4/FABER stretch, standing/long sitting calf stretch    Consulted and Agree with Plan of Care Patient             Patient will benefit from skilled therapeutic intervention in order to improve the following deficits and impairments:  Abnormal gait, Decreased balance, Decreased endurance, Decreased mobility, Difficulty walking, Cardiopulmonary status limiting activity, Decreased activity tolerance, Decreased safety awareness, Decreased strength  Visit Diagnosis: Muscle weakness (generalized)  Unsteadiness on feet     Problem List Patient Active Problem List   Diagnosis Date  Noted   Loss of weight 07/17/2015   Depression 07/17/2015   Cerebral palsy ([place]) 07/17/2015    Malavika Lira PT, DPT 05/09/2021, 12:26 PM  [place] MAIN [facility] SERVICES [address] [place], [place], [address] Phone: [phone]   Fax:  [phone]  Name: Kalle Bernath MRN: [id number] Date of Birth: Oct 02, 1996

## 2021-05-16 ENCOUNTER — Ambulatory Visit: Payer: Medicaid Other | Admitting: Physical Therapy

## 2021-05-23 ENCOUNTER — Ambulatory Visit: Payer: Medicaid Other | Attending: Internal Medicine | Admitting: Physical Therapy

## 2021-05-23 ENCOUNTER — Encounter: Payer: Self-pay | Admitting: Physical Therapy

## 2021-05-23 ENCOUNTER — Other Ambulatory Visit: Payer: Self-pay

## 2021-05-23 DIAGNOSIS — M6281 Muscle weakness (generalized): Secondary | ICD-10-CM | POA: Insufficient documentation

## 2021-05-23 DIAGNOSIS — R2681 Unsteadiness on feet: Secondary | ICD-10-CM | POA: Diagnosis present

## 2021-05-23 NOTE — Patient Instructions (Signed)
To obtain a new Child psychotherapist, please call [facility] Department of Social Services (DSS) and request a new one.  [address] [place], [place], [place] [address]  [phone]

## 2021-05-23 NOTE — Therapy (Signed)
[place] MAIN [facility] SERVICES [address] [place], [place], [address] Phone: [phone]   Fax:  [phone]  Physical Therapy Treatment  Patient Details  Name: Renee Walton MRN: [id number] Date of Birth: 1996/03/27 Referring Provider (PT): Perrin Maltese MD   Encounter Date: 05/23/2021   PT End of Session - 05/23/21 1143     Visit Number 13    Number of Visits 25    Date for PT Re-Evaluation 06/28/21    Authorization Type Medicaid (27 annual visit PT/OT)    Authorization Time Period authorized 12 visits 7/20-10/11    Authorization - Visit Number 2    Authorization - Number of Visits 12    PT Start Time 4765    PT Stop Time 1225    PT Time Calculation (min) 42 min    Equipment Utilized During Treatment Gait belt    Activity Tolerance Patient tolerated treatment well;No increased pain    Behavior During Therapy WFL for tasks assessed/performed             Past Medical History:  Diagnosis Date   Breakthrough bleeding    Cerebral palsy (HCC)    GAD (generalized anxiety disorder)    Migraine headache     Past Surgical History:  Procedure Laterality Date   LEG SURGERY     bilateral x2 in 2008    There were no vitals filed for this visit.   Subjective Assessment - 05/23/21 1148     Subjective Patient is wanting to talk with social worker; She states that her current worker is in [place] and hasn't been responding to her calls. She has been having more anxiety which is affecting her mobility and safety; She is still going to the gym.    Pertinent History Pt returns to therapy after previous bout of CP. Pt states her major goal with therapy is to improve her mobility as much as possible before returning to [place] this upcoming September. Pt states [place] is not as ADA compliant for her as the [place] and wants to improve her walking and balance to feel more confident because she can not bring her walker on the  airplane. Pt reports meeting with MD about possible foot alignment surgery on April 19th and her MD in [place] wants to perform a hip adductor release. 2 falls have occurred in last 6 months due to balance loss but no injury and able to stand indep with use of UE's to helpherself up. Pt does not specify which direction her LOB occurs. Pt uses gym 2x/week for stretching and leg exercises and is motivated to improve her mobility.    Currently in Pain? No/denies    Pain Onset More than a month ago    Multiple Pain Sites No                         NMR:   Standing on airex: -feet close together: unsupported static standing 30 sec hold x2 reps, intermittent rail assist; -feet close together eyes closed 10 sec hold x2 reps with min A and intermittent rail assist;  -Standing one foot on airex, one foot on 4 inch step:             Unsupported standing 15 sec hold x2 reps each foot on step;  Progressed to lateral head turns x5 reps each with min A for safety  -modified tandem stance:  Unsupported standing 10 sec hold with  intermittent rail assist  Progressed to lateral head turns x5 reps each with CGA for safety;   Patient has increased difficulty with staggered stance due to weakness in gluteal musc. Often losing balance laterally and leaning with upper trunk for balance recovery;   Ex: Bridges with arms by side x15 reps x2 sets with min A for positioning;  PT performed passive stretches at end of session to facilitate improved LE flexibility:  Supine: SLR hamstring stretch 30 sec hold x2 reps with ankle DF to facilitate better calf stretch; PT provided pressure to anterior thigh of contralateral LE to disassociate LE for better ROM -BLE hip adductor stretch 30 sec hold x2 reps to tolerance    Patient required min-mod A for most balance exercise, and cues for weight shift for better stance control; Pt exhibits increased lateral loss of balance with poor gluteal control due to  weakness. Patient reinforced in HEP including to continue with gym exercise; Patient verbalized understanding of HEP;                       PT Education - 05/23/21 1142     Education provided Yes    Education Details balance/gait safety/ ROM    Person(s) Educated Patient    Methods Explanation;Verbal cues    Comprehension Verbalized understanding;Returned demonstration;Verbal cues required;Need further instruction              PT Short Term Goals - 04/05/21 1117       PT SHORT TERM GOAL #1   Title Pt will be indep with performing HEP 3x/week to improve LE tone and strength for improved safety with at home ambulation and ADL's.    Baseline 3/10: Given LE stretches, 6/16: going to gym 1x a week, doing stretches 1x a week;    Time 6    Period Weeks    Status Not Met    Target Date 02/08/21               PT Long Term Goals - 04/19/21 1243       PT LONG TERM GOAL #1   Title Patient will increase Berg Balance score by > 6 points to demonstrate decreased fall risk during functional activities.    Baseline 12/28/20: 32/56 6/16:33/56    Time 12    Period Weeks    Status On-going    Target Date 06/28/21      PT LONG TERM GOAL #2   Title Pt will reduce 5xsSTS time to < 8 sec to demonstrate clinically significant improvement in LE strength and balance to reduce risk of falls.    Baseline 3/10: 10.33 sec no UE support, 6/16: 6 sec with arms across chest (partial ROM), but requires 12 sec for full sit to stand;    Time 12    Period Weeks    Status Partially Met    Target Date 06/28/21      PT LONG TERM GOAL #3   Title Pt will improve 6 MWT to > 1000' with no AD and no LOB to demonstrate ability to improve safety with community distances.    Baseline 3/10: 510', 6/16: 920 feet with CGA    Time 12    Period Weeks    Status Partially Met    Target Date 06/28/21      PT LONG TERM GOAL #4   Title Pt will improve 10 m gait speed with no AD > 1.0 m/s to  demonstrate reduced risk  of falls along with safe ability to complete community walking tasks.    Baseline 3/10: 0.78 m/s, 6/16: 0.84 m/s    Time 12    Period Weeks    Status Partially Met    Target Date 06/28/21      PT LONG TERM GOAL #5   Title Pt will improve FOTO score to 64 demonstrate perceived improvements in ability to complete ADL's.    Baseline 3/10: Risk adjusted 51; 04/19/21: 59    Time 12    Period Weeks    Status On-going    Target Date 06/28/21                   Plan - 05/23/21 1447     Clinical Impression Statement Patient motivated and participated well within session. She continues to have difficulty with balance tasks with increased instability with static stance activities. Utilized compliant surface to challenge stance control. Patient continues to have diminished LE hip/knee and ankle strategies, often using upper trunk for balance righting strategies. She required min a throughout session for safety. Finished session with LE stretches. Patient exhibits increased hip adductor tone requiring therapist assist to disassociate LE with ROM exercise. She expressed desire for new Education officer, museum. PT provided patient with information on how to obtain a new Education officer, museum. She would benefit from additional skilled PT Intervention to improve strength, balance and mobility;    Personal Factors and Comorbidities Comorbidity 2;Fitness;Past/Current Experience;Time since onset of injury/illness/exacerbation;Age    Comorbidities CP and anxiety;    Examination-Activity Limitations Bed Mobility    Examination-Participation Restrictions Church;Cleaning;Community Activity;Driving;Laundry;Medication Management;Meal Prep;Shop;Volunteer;Yard Work    Stability/Clinical Decision Making Unstable/Unpredictable    Rehab Potential Fair    PT Frequency 1x / week    PT Duration 12 weeks    PT Treatment/Interventions Patient/family education;Manual techniques;Therapeutic activities;Therapeutic  exercise;Electrical Stimulation;Aquatic Therapy;DME Instruction;Functional mobility training;Gait training;Neuromuscular re-education;ADLs/Self Care Home Management;Cryotherapy;Moist Heat;Stair training;Orthotic Fit/Training;Energy conservation    PT Next Visit Plan LE stretching, balance, gait    PT Home Exercise Plan Long sitting hamstring stretch, prone quad stretch, figure 4/FABER stretch, standing/long sitting calf stretch    Consulted and Agree with Plan of Care Patient             Patient will benefit from skilled therapeutic intervention in order to improve the following deficits and impairments:  Abnormal gait, Decreased balance, Decreased endurance, Decreased mobility, Difficulty walking, Cardiopulmonary status limiting activity, Decreased activity tolerance, Decreased safety awareness, Decreased strength  Visit Diagnosis: Muscle weakness (generalized)  Unsteadiness on feet     Problem List Patient Active Problem List   Diagnosis Date Noted   Loss of weight 07/17/2015   Depression 07/17/2015   Cerebral palsy ([place]) 07/17/2015    Trevonne Nyland PT, DPT 05/23/2021, 2:50 PM  [place] MAIN [facility] SERVICES [address] [place], [place], [address] Phone: [phone]   Fax:  [phone]  Name: Renee Walton MRN: [id number] Date of Birth: [birth year]

## 2021-05-30 ENCOUNTER — Ambulatory Visit: Payer: Medicaid Other | Admitting: Physical Therapy

## 2021-06-06 ENCOUNTER — Encounter: Payer: Self-pay | Admitting: Physical Therapy

## 2021-06-06 ENCOUNTER — Ambulatory Visit: Payer: Medicaid Other | Admitting: Physical Therapy

## 2021-06-06 ENCOUNTER — Other Ambulatory Visit: Payer: Self-pay

## 2021-06-06 DIAGNOSIS — R2681 Unsteadiness on feet: Secondary | ICD-10-CM

## 2021-06-06 DIAGNOSIS — M6281 Muscle weakness (generalized): Secondary | ICD-10-CM

## 2021-06-07 NOTE — Therapy (Signed)
[place] [facility] MAIN [facility] SERVICES [address] [place], [place], [address] Phone: [phone]   Fax:  [phone]  Patient Details  Name: Renee Walton MRN: [id number] Date of Birth: January 29, 1996 Referring Provider:  [name], MD  Encounter Date: 06/06/2021  Cancellation note:  Patient presents to therapy and expressed frustration over not having help with social services; Patient was approved for peer support for 14 hours per week. She states that the last 3 weeks her support person has cancelled numerous times. PT provided patient with DSS number last week; Patient states she called and left messages and no one returned her call  PT called DSS and spoke with Medicaid representative. They have no record of her peer support person and would need the information of her support person before they could re-assign social work.  PT reached out and spoke with her support person, Riley Kill [facility]); She said that patient's group session was cancelled today due to funeral and she had to cancel because she had an emergency doctor's appointment.  Expressed to [name] how patient is frustrated over not getting the help she is supposed to be getting. [name] responded that she is working with [name] to be more independent and setting independent goals. Expressed how [name] tried to call DSS for services (transportation) and hasn't heard back. [name] mentioned she would help her with this manner.  Session cancelled as limited time available after working out patient's social services and resources available to help her. Patient agreed.  She did report she is supposed to get botox in October. Will review plan of care to ensure enough visits available after Botox to improve mobility;   Kavin Weckwerth PT, DPT 06/07/2021, 1:04 PM  [place] [facility] MAIN [facility] SERVICES [address] [place], [place], [address] Phone:  [phone]   Fax:  [phone]

## 2021-06-13 ENCOUNTER — Encounter: Payer: Self-pay | Admitting: Physical Therapy

## 2021-06-13 ENCOUNTER — Other Ambulatory Visit: Payer: Self-pay

## 2021-06-13 ENCOUNTER — Ambulatory Visit: Payer: Medicaid Other | Admitting: Physical Therapy

## 2021-06-13 DIAGNOSIS — M6281 Muscle weakness (generalized): Secondary | ICD-10-CM

## 2021-06-13 DIAGNOSIS — R2681 Unsteadiness on feet: Secondary | ICD-10-CM

## 2021-06-13 NOTE — Therapy (Signed)
[place] MAIN [facility] SERVICES [address] [place], [place], [address] Phone: [phone]   Fax:  [phone]  Physical Therapy Treatment  Patient Details  Name: Renee Walton MRN: [id number] Date of Birth: Mar 07, 1996 Referring Provider (PT): Perrin Maltese MD   Encounter Date: 06/13/2021   PT End of Session - 06/13/21 1152     Visit Number 14    Number of Visits 25    Date for PT Re-Evaluation 06/28/21    Authorization Type Medicaid (27 annual visit PT/OT)    Authorization Time Period authorized 12 visits 7/20-10/11    Authorization - Visit Number 3    Authorization - Number of Visits 12    PT Start Time 1146    PT Stop Time 1230    PT Time Calculation (min) 44 min    Equipment Utilized During Treatment Gait belt    Activity Tolerance Patient tolerated treatment well;No increased pain    Behavior During Therapy WFL for tasks assessed/performed             Past Medical History:  Diagnosis Date   Breakthrough bleeding    Cerebral palsy (HCC)    GAD (generalized anxiety disorder)    Migraine headache     Past Surgical History:  Procedure Laterality Date   LEG SURGERY     bilateral x2 in 2008    There were no vitals filed for this visit.   Subjective Assessment - 06/13/21 1151     Subjective Patient reports doing okay. She reports her social worker has been helping a little more. She denies any pain; reports she is still unsteady at time but has been able to catch herself with no recent falls;    Pertinent History Pt returns to therapy after previous bout of CP. Pt states her major goal with therapy is to improve her mobility as much as possible before returning to [place] this upcoming September. Pt states [place] is not as ADA compliant for her as the [place] and wants to improve her walking and balance to feel more confident because she can not bring her walker on the airplane. Pt reports meeting with MD about  possible foot alignment surgery on April 19th and her MD in [place] wants to perform a hip adductor release. 2 falls have occurred in last 6 months due to balance loss but no injury and able to stand indep with use of UE's to helpherself up. Pt does not specify which direction her LOB occurs. Pt uses gym 2x/week for stretching and leg exercises and is motivated to improve her mobility.    Currently in Pain? No/denies    Pain Onset More than a month ago                    NMR:   Standing on airex: -feet close together: unsupported static standing 30 sec hold x2 reps, intermittent rail assist; -feet close together eyes closed 10 sec hold x2 reps with min A and intermittent rail assist;  -standing feet together, eyes open, holding ball x30 sec to challenge stance without rail assist, requiring CGA to close supervision for safety;  -Standing one foot on airex, one foot on 8 inch step:             Unsupported standing 30 sec hold x1 reps each foot on step;             Required min A and mod VCS to increase extension in  stance leg and improve erect posture for better stance control;    -modified tandem stance:             Unsupported standing 10 sec hold with intermittent rail assist             Ball pass side/side x5 reps with min A for safety;  Patient has increased difficulty with staggered stance due to weakness in gluteal musc. Often losing balance laterally and leaning with upper trunk for balance recovery;   Ex: Standing at bar: -hip abduction SLR yellow tband x10 reps with min A and mod VCS to keep foot oriented forward and avoid hip ER for better abductor strengthening; -side stepping x8 feet x2 laps each with BUE hand hold assist with min VCs to increase step length for better strengthening and muscle activation;  Bridges with arms by side x20 reps x2 sets with min A for positioning;  Hip abduction/ER red tband 2x10 requiring AAROM to increase end range of motion;  PT  performed passive stretches at end of session to facilitate improved LE flexibility:  Supine: SLR hamstring stretch 30 sec hold x2 reps with ankle DF to facilitate better calf stretch; PT provided pressure to anterior thigh of contralateral LE to disassociate LE for better ROM -Passive knee to chest stretch with contralateral LE hip in neutral to disassociate LE and reduce hip adductor tone 30 sec hold x1 rep each;  -BLE hip adductor stretch 30 sec hold x2 reps to tolerance     Patient required min A for most balance exercise, and cues for weight shift for better stance control; Pt exhibits increased lateral loss of balance with poor gluteal control due to weakness. Patient reinforced in HEP including to continue with gym exercise; Patient verbalized understanding of HEP;                      PT Education - 06/13/21 1152     Education provided Yes    Education Details balance/gait safety/ ROM    Person(s) Educated Patient    Methods Explanation;Verbal cues    Comprehension Verbalized understanding;Returned demonstration;Verbal cues required;Need further instruction              PT Short Term Goals - 04/05/21 1117       PT SHORT TERM GOAL #1   Title Pt will be indep with performing HEP 3x/week to improve LE tone and strength for improved safety with at home ambulation and ADL's.    Baseline 3/10: Given LE stretches, 6/16: going to gym 1x a week, doing stretches 1x a week;    Time 6    Period Weeks    Status Not Met    Target Date 02/08/21               PT Long Term Goals - 04/19/21 1243       PT LONG TERM GOAL #1   Title Patient will increase Berg Balance score by > 6 points to demonstrate decreased fall risk during functional activities.    Baseline 12/28/20: 32/56 6/16:33/56    Time 12    Period Weeks    Status On-going    Target Date 06/28/21      PT LONG TERM GOAL #2   Title Pt will reduce 5xsSTS time to < 8 sec to demonstrate clinically  significant improvement in LE strength and balance to reduce risk of falls.    Baseline 3/10: 10.33 sec no UE support, 6/16: 6  sec with arms across chest (partial ROM), but requires 12 sec for full sit to stand;    Time 12    Period Weeks    Status Partially Met    Target Date 06/28/21      PT LONG TERM GOAL #3   Title Pt will improve 6 MWT to > 1000' with no AD and no LOB to demonstrate ability to improve safety with community distances.    Baseline 3/10: 510', 6/16: 920 feet with CGA    Time 12    Period Weeks    Status Partially Met    Target Date 06/28/21      PT LONG TERM GOAL #4   Title Pt will improve 10 m gait speed with no AD > 1.0 m/s to demonstrate reduced risk of falls along with safe ability to complete community walking tasks.    Baseline 3/10: 0.78 m/s, 6/16: 0.84 m/s    Time 12    Period Weeks    Status Partially Met    Target Date 06/28/21      PT LONG TERM GOAL #5   Title Pt will improve FOTO score to 64 demonstrate perceived improvements in ability to complete ADL's.    Baseline 3/10: Risk adjusted 51; 04/19/21: 59    Time 12    Period Weeks    Status On-going    Target Date 06/28/21                   Plan - 06/13/21 1258     Clinical Impression Statement Patient motivated and participated well within session. She was instructed in advanced balance tasks working on static standing. Patient required min A and mod VCs for increased extension in stance for less loss of balance. Patient instructed in advanced LE strengthening requiring min VCs for proper positioning and exercise technique. She continues to have increased weakness in hip abductors and increased adductor tone which makes mobility difficult. Patient would benefit from additional skilled PT Intervention to improve strength, balance and mobility; Patient states she is not going to get BOTOX until next year but would like to continue with therapy throughout the year.    Personal Factors and  Comorbidities Comorbidity 2;Fitness;Past/Current Experience;Time since onset of injury/illness/exacerbation;Age    Comorbidities CP and anxiety;    Examination-Activity Limitations Bed Mobility    Examination-Participation Restrictions Church;Cleaning;Community Activity;Driving;Laundry;Medication Management;Meal Prep;Shop;Volunteer;Yard Work    Stability/Clinical Decision Making Unstable/Unpredictable    Rehab Potential Fair    PT Frequency 1x / week    PT Duration 12 weeks    PT Treatment/Interventions Patient/family education;Manual techniques;Therapeutic activities;Therapeutic exercise;Electrical Stimulation;Aquatic Therapy;DME Instruction;Functional mobility training;Gait training;Neuromuscular re-education;ADLs/Self Care Home Management;Cryotherapy;Moist Heat;Stair training;Orthotic Fit/Training;Energy conservation    PT Next Visit Plan LE stretching, balance, gait    PT Home Exercise Plan Long sitting hamstring stretch, prone quad stretch, figure 4/FABER stretch, standing/long sitting calf stretch    Consulted and Agree with Plan of Care Patient             Patient will benefit from skilled therapeutic intervention in order to improve the following deficits and impairments:  Abnormal gait, Decreased balance, Decreased endurance, Decreased mobility, Difficulty walking, Cardiopulmonary status limiting activity, Decreased activity tolerance, Decreased safety awareness, Decreased strength  Visit Diagnosis: Muscle weakness (generalized)  Unsteadiness on feet     Problem List Patient Active Problem List   Diagnosis Date Noted   Loss of weight 07/17/2015   Depression 07/17/2015   Cerebral palsy ([place]) 07/17/2015  Renee Walton PT, DPT 06/13/2021, 1:01 PM  [place] MAIN [facility] SERVICES [address] [place], [place], [address] Phone: [phone]   Fax:  [phone]  Name: Renee Walton MRN: [id number] Date of Birth:  01/11/1996

## 2021-06-20 ENCOUNTER — Ambulatory Visit: Payer: Medicaid Other | Admitting: Physical Therapy

## 2021-06-21 ENCOUNTER — Ambulatory Visit: Payer: Medicaid Other | Admitting: Physical Therapy

## 2021-06-27 ENCOUNTER — Ambulatory Visit: Payer: Medicaid Other | Admitting: Physical Therapy

## 2021-06-28 ENCOUNTER — Ambulatory Visit: Payer: Medicaid Other | Attending: Internal Medicine | Admitting: Physical Therapy

## 2021-06-28 DIAGNOSIS — R2681 Unsteadiness on feet: Secondary | ICD-10-CM | POA: Insufficient documentation

## 2021-06-28 DIAGNOSIS — M6281 Muscle weakness (generalized): Secondary | ICD-10-CM | POA: Insufficient documentation

## 2021-06-28 DIAGNOSIS — G809 Cerebral palsy, unspecified: Secondary | ICD-10-CM | POA: Insufficient documentation

## 2021-07-05 ENCOUNTER — Other Ambulatory Visit: Payer: Self-pay

## 2021-07-05 ENCOUNTER — Ambulatory Visit: Payer: Medicaid Other | Admitting: Physical Therapy

## 2021-07-05 ENCOUNTER — Encounter: Payer: Self-pay | Admitting: Physical Therapy

## 2021-07-05 DIAGNOSIS — M6281 Muscle weakness (generalized): Secondary | ICD-10-CM

## 2021-07-05 DIAGNOSIS — G809 Cerebral palsy, unspecified: Secondary | ICD-10-CM

## 2021-07-05 DIAGNOSIS — R2681 Unsteadiness on feet: Secondary | ICD-10-CM

## 2021-07-05 NOTE — Therapy (Signed)
[place] MAIN [facility] SERVICES [address] [place], [place], [address] Phone: [phone]   Fax:  [phone]  Physical Therapy Treatment  Patient Details  Name: Renee Walton MRN: [id number] Date of Birth: 14-Oct-1996 Referring Provider (PT): Perrin Maltese MD   Encounter Date: 07/05/2021   PT End of Session - 07/05/21 1154     Visit Number 15    Number of Visits 27    Date for PT Re-Evaluation 09/27/21    Authorization Type Medicaid (27 annual visit PT/OT)    Authorization Time Period authorized 12 visits 7/20-10/11    Authorization - Visit Number 4    Authorization - Number of Visits 12    PT Start Time 9741    PT Stop Time 1230    PT Time Calculation (min) 42 min    Equipment Utilized During Treatment Gait belt    Activity Tolerance Patient tolerated treatment well;No increased pain    Behavior During Therapy WFL for tasks assessed/performed             Past Medical History:  Diagnosis Date   Breakthrough bleeding    Cerebral palsy (HCC)    GAD (generalized anxiety disorder)    Migraine headache     Past Surgical History:  Procedure Laterality Date   LEG SURGERY     bilateral x2 in 2008    There were no vitals filed for this visit.   Subjective Assessment - 07/05/21 1156     Subjective Patient reports her grandmother passed last week. She states that they are leaving Mid October through December to go to [place].    Pertinent History Pt returns to therapy after previous bout of CP. Pt states her major goal with therapy is to improve her mobility as much as possible before returning to [place] this upcoming September. Pt states [place] is not as ADA compliant for her as the [place] and wants to improve her walking and balance to feel more confident because she can not bring her walker on the airplane. Pt reports meeting with MD about possible foot alignment surgery on April 19th and her MD in [place] wants to  perform a hip adductor release. 2 falls have occurred in last 6 months due to balance loss but no injury and able to stand indep with use of UE's to helpherself up. Pt does not specify which direction her LOB occurs. Pt uses gym 2x/week for stretching and leg exercises and is motivated to improve her mobility.    Currently in Pain? No/denies    Pain Onset More than a month ago    Multiple Pain Sites No                [facility] PT Assessment - 07/05/21 0001       Observation/Other Assessments   Focus on Therapeutic Outcomes (FOTO)  63 (P)       6 minute walk test results    Aerobic Endurance Distance Walked 850    Endurance additional comments with CGA to min A; more impaired than 04/05/21 which was 920 feet, slight increased instability with poor foot clearance;      Standardized Balance Assessment   Five times sit to stand comments  9.10 sec with arms across chest (<15 sec indicates low risk for falls), improved from 12 sec on 04/05/21    10 Meter Walk 0.99 m/s without AD, CGA, limited home ambulator, improved from 0.84 m/s on 04/05/21  Berg Balance Test   Sit to Stand Able to stand without using hands and stabilize independently    Standing Unsupported Able to stand 2 minutes with supervision    Sitting with Back Unsupported but Feet Supported on Floor or Stool Able to sit safely and securely 2 minutes    Stand to Sit Sits safely with minimal use of hands    Transfers Able to transfer safely, definite need of hands    Standing Unsupported with Eyes Closed Able to stand 10 seconds safely    Standing Unsupported with Feet Together Able to place feet together independently but unable to hold for 30 seconds    From Standing, Reach Forward with Outstretched Arm Can reach confidently >25 cm (10")    From Standing Position, Pick up Object from Floor Able to pick up shoe, needs supervision    From Standing Position, Turn to Look Behind Over each Shoulder Looks behind one side only/other  side shows less weight shift    Turn 360 Degrees Needs assistance while turning    Standing Unsupported, Alternately Place Feet on Step/Stool Needs assistance to keep from falling or unable to try    Standing Unsupported, One Foot in Front Needs help to step but can hold 15 seconds    Standing on One Leg Unable to try or needs assist to prevent fall    Total Score 35    Berg comment: 100% risk for falls, improved from 33/56                   TREATMENT:    Instructed patient in outcome measures to address progress towards goals, see above;    Patient does exhibit improvement in gait ability although continues to require intermittent HHA for balance especially during turns or negotiating obstacles. She does test as a high risk for falls;                      PT Education - 07/05/21 1253     Education provided Yes    Education Details progress towards goals;    Person(s) Educated Patient    Methods Explanation;Verbal cues    Comprehension Verbalized understanding;Returned demonstration;Verbal cues required;Need further instruction              PT Short Term Goals - 07/05/21 1222       PT SHORT TERM GOAL #1   Title Pt will be indep with performing HEP 3x/week to improve LE tone and strength for improved safety with at home ambulation and ADL's.    Baseline 3/10: Given LE stretches, 6/16: going to gym 1x a week, doing stretches 1x a week; 9/15: 2x a week going to gym;    Time 4    Period Weeks    Status Not Met    Target Date 08/02/21               PT Long Term Goals - 07/05/21 1206       PT LONG TERM GOAL #1   Title Patient will increase Berg Balance score by > 6 points to demonstrate decreased fall risk during functional activities.    Baseline 12/28/20: 32/56 6/16:33/56, 9/15: 35/56    Time 12    Period Weeks    Status Partially Met    Target Date 09/27/21      PT LONG TERM GOAL #2   Title Pt will reduce 5xsSTS time to < 8 sec to  demonstrate clinically significant improvement  in LE strength and balance to reduce risk of falls.    Baseline 3/10: 10.33 sec no UE support, 6/16: 6 sec with arms across chest (partial ROM), but requires 12 sec for full sit to stand; 9/15: 9.10 sec with arms across chest;    Time 12    Period Weeks    Status Partially Met    Target Date 09/27/21      PT LONG TERM GOAL #3   Title Pt will improve 6 MWT to > 1000' with no AD and no LOB to demonstrate ability to improve safety with community distances.    Baseline 3/10: 510', 6/16: 920 feet with CGA, 9/15: 850 feet    Time 12    Period Weeks    Status Partially Met    Target Date 09/27/21      PT LONG TERM GOAL #4   Title Pt will improve 10 m gait speed with no AD > 1.0 m/s to demonstrate reduced risk of falls along with safe ability to complete community walking tasks.    Baseline 3/10: 0.78 m/s, 6/16: 0.84 m/s, 9/15: 0.99 m/s    Time 12    Period Weeks    Status Partially Met    Target Date 09/27/21      PT LONG TERM GOAL #5   Title Pt will improve FOTO score to 64 demonstrate perceived improvements in ability to complete ADL's.    Baseline 3/10: Risk adjusted 51; 04/19/21: 59, 9/15: 63%    Time 12    Period Weeks    Status On-going    Target Date 09/27/21                   Plan - 07/05/21 1253     Clinical Impression Statement PT instructed patient in outcome measures to address progress towards goals. Patient exhibits improvement in most areas including sit<>stand, gait speed and balance. Despite improvement, she is still considered at a high risk for falls. She ambulates without AD with increased lateral lean due to weakness in hip abductors. She requires CGA for safety with occasional foot drag and loss of balance, requiring intermittent min A to avoid loss of balance. Patient is going to wait on botox injection until next year as she is scheduled to return to [place] for funeral in October 2022. Patient would benefit  from skilled PT intervention to improve strength, balance and mobility;    Personal Factors and Comorbidities Comorbidity 2;Fitness;Past/Current Experience;Time since onset of injury/illness/exacerbation;Age    Comorbidities CP and anxiety;    Examination-Activity Limitations Bed Mobility    Examination-Participation Restrictions Church;Cleaning;Community Activity;Driving;Laundry;Medication Management;Meal Prep;Shop;Volunteer;Yard Work    Stability/Clinical Decision Making Unstable/Unpredictable    Rehab Potential Fair    PT Frequency 1x / week    PT Duration 12 weeks    PT Treatment/Interventions Patient/family education;Manual techniques;Therapeutic activities;Therapeutic exercise;Electrical Stimulation;Aquatic Therapy;DME Instruction;Functional mobility training;Gait training;Neuromuscular re-education;ADLs/Self Care Home Management;Cryotherapy;Moist Heat;Stair training;Orthotic Fit/Training;Energy conservation    PT Next Visit Plan LE stretching, balance, gait    PT Home Exercise Plan Long sitting hamstring stretch, prone quad stretch, figure 4/FABER stretch, standing/long sitting calf stretch    Consulted and Agree with Plan of Care Patient             Patient will benefit from skilled therapeutic intervention in order to improve the following deficits and impairments:  Abnormal gait, Decreased balance, Decreased endurance, Decreased mobility, Difficulty walking, Cardiopulmonary status limiting activity, Decreased activity tolerance, Decreased safety awareness, Decreased strength  Visit  Diagnosis: Muscle weakness (generalized)  Unsteadiness on feet  Cerebral palsy, unspecified type [facility])     Problem List Patient Active Problem List   Diagnosis Date Noted   Loss of weight 07/17/2015   Depression 07/17/2015   Cerebral palsy ([place]) 07/17/2015    Trotter,Margaret, PT, DPT 07/05/2021, 12:56 PM  [place] MAIN [facility] SERVICES [address] [place], [place], [address] Phone: [phone]   Fax:  [phone]  Name: Renee Walton MRN: [id number] Date of Birth: 07/27/1996

## 2021-07-11 ENCOUNTER — Ambulatory Visit: Payer: Medicaid Other | Admitting: Physical Therapy

## 2021-07-12 ENCOUNTER — Other Ambulatory Visit: Payer: Self-pay

## 2021-07-12 ENCOUNTER — Ambulatory Visit: Payer: Medicaid Other

## 2021-07-12 DIAGNOSIS — G809 Cerebral palsy, unspecified: Secondary | ICD-10-CM

## 2021-07-12 DIAGNOSIS — M6281 Muscle weakness (generalized): Secondary | ICD-10-CM

## 2021-07-12 DIAGNOSIS — R2681 Unsteadiness on feet: Secondary | ICD-10-CM

## 2021-07-12 NOTE — Therapy (Signed)
[place] MAIN [facility] SERVICES [address] [place], [place], [address] Phone: [phone]   Fax:  [phone]  Physical Therapy Treatment  Patient Details  Name: Renee Walton MRN: [id number] Date of Birth: 08-Jun-1996 Referring Provider (PT): Perrin Maltese MD   Encounter Date: 07/12/2021   PT End of Session - 07/12/21 1240     Visit Number 16    Number of Visits 27    Date for PT Re-Evaluation 09/27/21    Authorization Type Medicaid (27 annual visit PT/OT)    Authorization Time Period authorized 12 visits 7/20-10/11    Authorization - Visit Number 5    Authorization - Number of Visits 12    PT Start Time 7078    PT Stop Time 1230    PT Time Calculation (min) 38 min    Equipment Utilized During Treatment Gait belt    Activity Tolerance Patient tolerated treatment well;No increased pain    Behavior During Therapy WFL for tasks assessed/performed             Past Medical History:  Diagnosis Date   Breakthrough bleeding    Cerebral palsy (HCC)    GAD (generalized anxiety disorder)    Migraine headache     Past Surgical History:  Procedure Laterality Date   LEG SURGERY     bilateral x2 in 2008    There were no vitals filed for this visit.   Subjective Assessment - 07/12/21 1236     Subjective Pt doing well today, no updates.    Pertinent History Pt returns to therapy after previous bout of CP. Pt states her major goal with therapy is to improve her mobility as much as possible before returning to [place] this upcoming September. Pt states [place] is not as ADA compliant for her as the [place] and wants to improve her walking and balance to feel more confident because she can not bring her walker on the airplane. Pt reports meeting with MD about possible foot alignment surgery on April 19th and her MD in [place] wants to perform a hip adductor release. 2 falls have occurred in last 6 months due to balance loss but no  injury and able to stand indep with use of UE's to helpherself up. Pt does not specify which direction her LOB occurs. Pt uses gym 2x/week for stretching and leg exercises and is motivated to improve her mobility.            Intervention -airex normal stance 3 x 60sec  -airex normal stance eyes closed 10x5sec (limited success in confidence starting)  -airex stance bimanual mani[pulation task with cones x2 minutes  -standing one foot on airex, one foot on 4 inch step 3x30sec bilat  -FWD AMB in clinic overground, no AD 235f -retro AMB in hallway x5 minutes, no device (~267f       PT Short Term Goals - 07/05/21 1222       PT SHORT TERM GOAL #1   Title Pt will be indep with performing HEP 3x/week to improve LE tone and strength for improved safety with at home ambulation and ADL's.    Baseline 3/10: Given LE stretches, 6/16: going to gym 1x a week, doing stretches 1x a week; 9/15: 2x a week going to gym;    Time 4    Period Weeks    Status Not Met    Target Date 08/02/21  PT Long Term Goals - 07/05/21 1206       PT LONG TERM GOAL #1   Title Patient will increase Berg Balance score by > 6 points to demonstrate decreased fall risk during functional activities.    Baseline 12/28/20: 32/56 6/16:33/56, 9/15: 35/56    Time 12    Period Weeks    Status Partially Met    Target Date 09/27/21      PT LONG TERM GOAL #2   Title Pt will reduce 5xsSTS time to < 8 sec to demonstrate clinically significant improvement in LE strength and balance to reduce risk of falls.    Baseline 3/10: 10.33 sec no UE support, 6/16: 6 sec with arms across chest (partial ROM), but requires 12 sec for full sit to stand; 9/15: 9.10 sec with arms across chest;    Time 12    Period Weeks    Status Partially Met    Target Date 09/27/21      PT LONG TERM GOAL #3   Title Pt will improve 6 MWT to > 1000' with no AD and no LOB to demonstrate ability to improve safety with community distances.     Baseline 3/10: 510', 6/16: 920 feet with CGA, 9/15: 850 feet    Time 12    Period Weeks    Status Partially Met    Target Date 09/27/21      PT LONG TERM GOAL #4   Title Pt will improve 10 m gait speed with no AD > 1.0 m/s to demonstrate reduced risk of falls along with safe ability to complete community walking tasks.    Baseline 3/10: 0.78 m/s, 6/16: 0.84 m/s, 9/15: 0.99 m/s    Time 12    Period Weeks    Status Partially Met    Target Date 09/27/21      PT LONG TERM GOAL #5   Title Pt will improve FOTO score to 64 demonstrate perceived improvements in ability to complete ADL's.    Baseline 3/10: Risk adjusted 51; 04/19/21: 59, 9/15: 63%    Time 12    Period Weeks    Status On-going    Target Date 09/27/21                   Plan - 07/12/21 1243     Clinical Impression Statement Continued with current plan of care as laid out in evaluation and recent prior sessions. Pt remains motivated to advance progress toward goals. Rest breaks provided as needed, pt quick to ask when needed. Pt requires intermittent modA for falls recovery, typically with posterior sway scenarios. Pt continues to demonstrate progress toward goals AEB progression of some interventions this date either in volume or intensity.   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    Surveyor, mining    Rehab Potential Fair    PT Frequency 1x / week    PT Duration 12 weeks    PT Treatment/Interventions Patient/family education;Manual techniques;Therapeutic activities;Therapeutic exercise;Electrical Stimulation;Aquatic  Therapy;DME Instruction;Functional mobility training;Gait training;Neuromuscular re-education;ADLs/Self Care Home Management;Cryotherapy;Moist Heat;Stair training;Orthotic Fit/Training;Energy conservation    PT Next Visit Plan LE stretching, balance, gait    PT Home Exercise Plan Long sitting hamstring stretch, prone quad stretch, figure 4/FABER stretch, standing/long sitting calf stretch  Consulted and Agree with Plan of Care Patient             Patient will benefit from skilled therapeutic intervention in order to improve the following deficits and impairments:  Abnormal gait, Decreased balance, Decreased endurance, Decreased mobility, Difficulty walking, Cardiopulmonary status limiting activity, Decreased activity tolerance, Decreased safety awareness, Decreased strength  Visit Diagnosis: Muscle weakness (generalized)  Unsteadiness on feet  Cerebral palsy, unspecified type [facility])     Problem List Patient Active Problem List   Diagnosis Date Noted   Loss of weight 07/17/2015   Depression 07/17/2015   Cerebral palsy ([place]) 07/17/2015   12:59 PM, 07/12/21 Etta Grandchild, PT, DPT Physical Therapist - [facility]  Outpatient Physical Therapy- [place] [phone]     [place], PT 07/12/2021, 12:48 PM  [place] MAIN [facility] SERVICES [address] [place], [place], [address] Phone: [phone]   Fax:  [phone]  Name: Renee Walton MRN: [id number] Date of Birth: 1996-01-06

## 2021-07-18 ENCOUNTER — Ambulatory Visit: Payer: Medicaid Other | Admitting: Physical Therapy

## 2021-07-19 ENCOUNTER — Other Ambulatory Visit: Payer: Self-pay

## 2021-07-19 ENCOUNTER — Ambulatory Visit: Payer: Medicaid Other | Admitting: Physical Therapy

## 2021-07-19 ENCOUNTER — Encounter: Payer: Self-pay | Admitting: Physical Therapy

## 2021-07-19 DIAGNOSIS — M6281 Muscle weakness (generalized): Secondary | ICD-10-CM

## 2021-07-19 DIAGNOSIS — R2681 Unsteadiness on feet: Secondary | ICD-10-CM

## 2021-07-19 NOTE — Therapy (Signed)
[place] MAIN [facility] SERVICES [address] [place], [place], [address] Phone: [phone]   Fax:  [phone]  Physical Therapy Treatment  Patient Details  Name: Renee Walton MRN: [id number] Date of Birth: 1996-05-12 Referring Provider (PT): Perrin Maltese MD   Encounter Date: 07/19/2021   PT End of Session - 07/19/21 1155     Visit Number 17    Number of Visits 27    Date for PT Re-Evaluation 09/27/21    Authorization Type Medicaid (27 annual visit PT/OT)    Authorization Time Period authorized 12 visits 7/20-10/11    Authorization - Visit Number 6    Authorization - Number of Visits 12    PT Start Time 5681    PT Stop Time 1230    PT Time Calculation (min) 41 min    Equipment Utilized During Treatment Gait belt    Activity Tolerance Patient tolerated treatment well;No increased pain    Behavior During Therapy WFL for tasks assessed/performed             Past Medical History:  Diagnosis Date   Breakthrough bleeding    Cerebral palsy (HCC)    GAD (generalized anxiety disorder)    Migraine headache     Past Surgical History:  Procedure Laterality Date   LEG SURGERY     bilateral x2 in 2008    There were no vitals filed for this visit.   Subjective Assessment - 07/19/21 1152     Subjective Patient reports doing well; No new changes: she reports needing a report for her doctors in [place]; She also reports increased soreness in BLE as tightness and stinging;    Pertinent History Pt returns to therapy after previous bout of CP. Pt states her major goal with therapy is to improve her mobility as much as possible before returning to [place] this upcoming September. Pt states [place] is not as ADA compliant for her as the [place] and wants to improve her walking and balance to feel more confident because she can not bring her walker on the airplane. Pt reports meeting with MD about possible foot alignment surgery on  April 19th and her MD in [place] wants to perform a hip adductor release. 2 falls have occurred in last 6 months due to balance loss but no injury and able to stand indep with use of UE's to helpherself up. Pt does not specify which direction her LOB occurs. Pt uses gym 2x/week for stretching and leg exercises and is motivated to improve her mobility.    Currently in Pain? Yes    Pain Score 4     Pain Location Leg    Pain Orientation Right;Posterior    Pain Descriptors / Indicators Tightness;Stabbing    Pain Type Chronic pain    Pain Onset More than a month ago    Pain Frequency Intermittent    Aggravating Factors  worse with movement    Pain Relieving Factors massage/heat/rest    Effect of Pain on Daily Activities decreased activity tolerance;    Multiple Pain Sites No              NMR:   Standing on airex: -feet apart to challenge hip abductor/gluteal control: unsupported static standing 30 sec hold x2 reps, intermittent rail assist;  Progressed to head turns side/side x5 reps with tactile and verbal cues to increase hip extension and knee extension in stance  Progressed to reaching for ball side/side x5 reps each UE  with moderate verbal and tactile cues for better hip position; With prolonged standing patient exhibits increased hip flexion and hip adduction with trunk rotation to stabilize and avoid loss of balance; She had difficulty maintaining hip extension and gluteal activation especially on RLE;    -Standing one foot on airex, one foot on 8 inch step:             Unsupported standing 30 sec hold x1 reps each foot on step;  Progressed to head turns side/side x5 reps each;              Required min A and mod VCS to increase extension in stance leg and improve erect posture for better stance control;      Ex:   Bridges with arms by side x20 reps x2 sets with min A for positioning;  Hip abduction/ER red tband 2x10 requiring AAROM to increase end range of motion;  PT  performed passive stretches at end of session to facilitate improved LE flexibility:  Supine: SLR hamstring stretch 30 sec hold x1 reps with ankle DF to facilitate better calf stretch; PT provided pressure to anterior thigh of contralateral LE to disassociate LE for better ROM -Passive knee to chest stretch with contralateral LE hip in neutral to disassociate LE and reduce hip adductor tone 30 sec hold x1 rep each;  -BLE hip adductor stretch 30 sec hold x2 reps to tolerance     Patient required min A for most balance exercise, and cues for weight shift for better stance control; Pt exhibits increased lateral/posterior loss of balance with poor gluteal control due to weakness. She also exhibits increased hip flexion and adduction with prolonged standing due to weakness with prolonged standing; Patient reinforced in HEP including to continue with gym exercise; Patient verbalized understanding of HEP;                                 PT Short Term Goals - 07/05/21 1222       PT SHORT TERM GOAL #1   Title Pt will be indep with performing HEP 3x/week to improve LE tone and strength for improved safety with at home ambulation and ADL's.    Baseline 3/10: Given LE stretches, 6/16: going to gym 1x a week, doing stretches 1x a week; 9/15: 2x a week going to gym;    Time 4    Period Weeks    Status Not Met    Target Date 08/02/21               PT Long Term Goals - 07/05/21 1206       PT LONG TERM GOAL #1   Title Patient will increase Berg Balance score by > 6 points to demonstrate decreased fall risk during functional activities.    Baseline 12/28/20: 32/56 6/16:33/56, 9/15: 35/56    Time 12    Period Weeks    Status Partially Met    Target Date 09/27/21      PT LONG TERM GOAL #2   Title Pt will reduce 5xsSTS time to < 8 sec to demonstrate clinically significant improvement in LE strength and balance to reduce risk of falls.    Baseline 3/10: 10.33 sec no UE  support, 6/16: 6 sec with arms across chest (partial ROM), but requires 12 sec for full sit to stand; 9/15: 9.10 sec with arms across chest;    Time 12  Period Weeks    Status Partially Met    Target Date 09/27/21      PT LONG TERM GOAL #3   Title Pt will improve 6 MWT to > 1000' with no AD and no LOB to demonstrate ability to improve safety with community distances.    Baseline 3/10: 510', 6/16: 920 feet with CGA, 9/15: 850 feet    Time 12    Period Weeks    Status Partially Met    Target Date 09/27/21      PT LONG TERM GOAL #4   Title Pt will improve 10 m gait speed with no AD > 1.0 m/s to demonstrate reduced risk of falls along with safe ability to complete community walking tasks.    Baseline 3/10: 0.78 m/s, 6/16: 0.84 m/s, 9/15: 0.99 m/s    Time 12    Period Weeks    Status Partially Met    Target Date 09/27/21      PT LONG TERM GOAL #5   Title Pt will improve FOTO score to 64 demonstrate perceived improvements in ability to complete ADL's.    Baseline 3/10: Risk adjusted 51; 04/19/21: 59, 9/15: 63%    Time 12    Period Weeks    Status On-going    Target Date 09/27/21                   Plan - 07/19/21 1304     Clinical Impression Statement Patient motivated and participated well within session. She was instructed in advanced balance tasks, utilizing compliant surface to challenge stance control. PT instructed patient in stance exercise with feet apart to challenge hip abductor activation and reduce adductor tone. She does exhibit increased hip flexion and adduction with prolonged standing with increased posterior/lateral loss of balance. She was instructed in advanced LE strengthening exercise. Patient continues to exhibit decreased ROM due to weakness and hip adductor tone. She would benefit from additional skilled PT Intervention to improve strength, balance and mobility;    Personal Factors and Comorbidities Comorbidity 2;Fitness;Past/Current Experience;Time since  onset of injury/illness/exacerbation;Age    Comorbidities CP and anxiety;    Examination-Activity Limitations Bed Mobility    Examination-Participation Restrictions Church;Cleaning;Community Activity;Driving;Laundry;Medication Management;Meal Prep;Shop;Volunteer;Yard Work    Stability/Clinical Decision Making Unstable/Unpredictable    Rehab Potential Fair    PT Frequency 1x / week    PT Duration 12 weeks    PT Treatment/Interventions Patient/family education;Manual techniques;Therapeutic activities;Therapeutic exercise;Electrical Stimulation;Aquatic Therapy;DME Instruction;Functional mobility training;Gait training;Neuromuscular re-education;ADLs/Self Care Home Management;Cryotherapy;Moist Heat;Stair training;Orthotic Fit/Training;Energy conservation    PT Next Visit Plan LE stretching, balance, gait    PT Home Exercise Plan Long sitting hamstring stretch, prone quad stretch, figure 4/FABER stretch, standing/long sitting calf stretch    Consulted and Agree with Plan of Care Patient             Patient will benefit from skilled therapeutic intervention in order to improve the following deficits and impairments:  Abnormal gait, Decreased balance, Decreased endurance, Decreased mobility, Difficulty walking, Cardiopulmonary status limiting activity, Decreased activity tolerance, Decreased safety awareness, Decreased strength  Visit Diagnosis: Muscle weakness (generalized)  Unsteadiness on feet     Problem List Patient Active Problem List   Diagnosis Date Noted   Loss of weight 07/17/2015   Depression 07/17/2015   Cerebral palsy ([place]) 07/17/2015    Diyana Starrett, PT, DPT 07/19/2021, 1:05 PM  [place] MAIN [facility] SERVICES [address] [place], [place], [address] Phone: [phone]   Fax:  [phone]  Name: Renee Walton MRN: [id number] Date of Birth: 06-09-1996

## 2021-07-25 ENCOUNTER — Ambulatory Visit: Payer: Medicaid Other | Admitting: Physical Therapy

## 2021-07-26 ENCOUNTER — Ambulatory Visit: Payer: Medicaid Other | Admitting: Physical Therapy

## 2021-08-02 ENCOUNTER — Other Ambulatory Visit: Payer: Self-pay

## 2021-08-02 ENCOUNTER — Encounter: Payer: Self-pay | Admitting: Physical Therapy

## 2021-08-02 ENCOUNTER — Ambulatory Visit: Payer: Medicaid Other | Attending: Internal Medicine | Admitting: Physical Therapy

## 2021-08-02 DIAGNOSIS — R2681 Unsteadiness on feet: Secondary | ICD-10-CM | POA: Insufficient documentation

## 2021-08-02 DIAGNOSIS — M6281 Muscle weakness (generalized): Secondary | ICD-10-CM | POA: Insufficient documentation

## 2021-08-02 NOTE — Therapy (Signed)
[place] MAIN [facility] SERVICES [address] [place], [place], [address] Phone: [phone]   Fax:  [phone]  Physical Therapy Treatment  Patient Details  Name: Renee Walton MRN: [id number] Date of Birth: 1995/11/07 Referring Provider (PT): Perrin Maltese MD   Encounter Date: 08/02/2021   PT End of Session - 08/02/21 1353     Visit Number 18    Number of Visits 27    Date for PT Re-Evaluation 09/27/21    Authorization Type Medicaid (27 annual visit PT/OT)    Authorization Time Period authorized 9 visits 10/13-12/14    Authorization - Visit Number 1    Authorization - Number of Visits 9    PT Start Time 1146    PT Stop Time 1230    PT Time Calculation (min) 44 min    Equipment Utilized During Treatment Gait belt    Activity Tolerance Patient tolerated treatment well;No increased pain    Behavior During Therapy WFL for tasks assessed/performed             Past Medical History:  Diagnosis Date   Breakthrough bleeding    Cerebral palsy (HCC)    GAD (generalized anxiety disorder)    Migraine headache     Past Surgical History:  Procedure Laterality Date   LEG SURGERY     bilateral x2 in 2008    There were no vitals filed for this visit.   Subjective Assessment - 08/02/21 1153     Subjective Patient reports getting Covid booster and Flu shot and had a hard time walking earlier this week. She reports feeling a little better now.    Pertinent History Pt returns to therapy after previous bout of CP. Pt states her major goal with therapy is to improve her mobility as much as possible before returning to [place] this upcoming September. Pt states [place] is not as ADA compliant for her as the [place] and wants to improve her walking and balance to feel more confident because she can not bring her walker on the airplane. Pt reports meeting with MD about possible foot alignment surgery on April 19th and her MD in [place]  wants to perform a hip adductor release. 2 falls have occurred in last 6 months due to balance loss but no injury and able to stand indep with use of UE's to helpherself up. Pt does not specify which direction her LOB occurs. Pt uses gym 2x/week for stretching and leg exercises and is motivated to improve her mobility.    Currently in Pain? No/denies    Pain Onset More than a month ago             TREATMENT:   Ex:   Bridges with arms by side and legs apart to challenge gluteal activation 15 reps x1 sets with min A for positioning;  Hip abduction/ER red tband 2x10 requiring AAROM to increase end range of motion;  Instructed patient in resisted hip flexion/extension against therapist manual resistance x10 reps each LE. Patient had moderate difficulty with hip flexion being able to only tolerate minimal resistance. She was able to exhibit increased strength with hip extension tolerating moderate resistance;   Supine: SLR hamstring stretch 30 sec hold x1 reps with ankle DF to facilitate better calf stretch; PT provided pressure to anterior thigh of contralateral LE to disassociate LE for better ROM -Passive knee to chest stretch with contralateral LE hip in neutral to disassociate LE and reduce hip adductor tone 30  sec hold x1 rep each;  -BLE hip adductor stretch 30 sec hold x2 reps to tolerance    Patient did need to use the bathroom during session; She ambulated 80 feet x2 with therapist 1 HHA, min A for safety with increased hip adduction/narrow base of support. She ambulates with short shuffled steps with decreased step length bilaterally. Patient continues to exhibit increased lateral trunk sway with increased frontal plane motion due to weakness in hip abductors for gait stability.  Pt reports increased fatigue today and believes that her gait is more impaired due to recent vaccination;  Pt plans to go to [place] this week for the next several months. Provided patient with written  handout of progress this year and things therapy has been addressing so that she can obtain physical therapy in [place]. Will put patient on hold until she returns back to the [place].                       PT Education - 08/02/21 1353     Education provided Yes    Education Details LE strengthening/plan of care    Person(s) Educated Patient    Methods Explanation;Verbal cues    Comprehension Verbalized understanding;Returned demonstration;Verbal cues required;Need further instruction              PT Short Term Goals - 07/05/21 1222       PT SHORT TERM GOAL #1   Title Pt will be indep with performing HEP 3x/week to improve LE tone and strength for improved safety with at home ambulation and ADL's.    Baseline 3/10: Given LE stretches, 6/16: going to gym 1x a week, doing stretches 1x a week; 9/15: 2x a week going to gym;    Time 4    Period Weeks    Status Not Met    Target Date 08/02/21               PT Long Term Goals - 07/05/21 1206       PT LONG TERM GOAL #1   Title Patient will increase Berg Balance score by > 6 points to demonstrate decreased fall risk during functional activities.    Baseline 12/28/20: 32/56 6/16:33/56, 9/15: 35/56    Time 12    Period Weeks    Status Partially Met    Target Date 09/27/21      PT LONG TERM GOAL #2   Title Pt will reduce 5xsSTS time to < 8 sec to demonstrate clinically significant improvement in LE strength and balance to reduce risk of falls.    Baseline 3/10: 10.33 sec no UE support, 6/16: 6 sec with arms across chest (partial ROM), but requires 12 sec for full sit to stand; 9/15: 9.10 sec with arms across chest;    Time 12    Period Weeks    Status Partially Met    Target Date 09/27/21      PT LONG TERM GOAL #3   Title Pt will improve 6 MWT to > 1000' with no AD and no LOB to demonstrate ability to improve safety with community distances.    Baseline 3/10: 510', 6/16: 920 feet with CGA, 9/15:  850 feet    Time 12    Period Weeks    Status Partially Met    Target Date 09/27/21      PT LONG TERM GOAL #4   Title Pt will improve 10 m gait speed with no AD > 1.0  m/s to demonstrate reduced risk of falls along with safe ability to complete community walking tasks.    Baseline 3/10: 0.78 m/s, 6/16: 0.84 m/s, 9/15: 0.99 m/s    Time 12    Period Weeks    Status Partially Met    Target Date 09/27/21      PT LONG TERM GOAL #5   Title Pt will improve FOTO score to 64 demonstrate perceived improvements in ability to complete ADL's.    Baseline 3/10: Risk adjusted 51; 04/19/21: 59, 9/15: 63%    Time 12    Period Weeks    Status On-going    Target Date 09/27/21                   Plan - 08/02/21 1354     Clinical Impression Statement Patient motivated and participated well within session. She reports increased weakness and stiffness after getting covid booster and Flu shot. Patient instructed in LE strengthening exercise. SHe does require min A and min VCs for positioning and exercise technique. patient does exhibit increased hip adductor tone having difficulty achieving optimal position. Patient required min A with gait this session with narrow base of support, short shuffled steps. She is leaving this week to go to [place] with her family. she will return end of December. Plan to put patiet on hold at this time. [place]    Personal Factors and Comorbidities Comorbidity 2;Fitness;Past/Current Experience;Time since onset of injury/illness/exacerbation;Age    Comorbidities CP and anxiety;    Examination-Activity Limitations Bed Mobility    Examination-Participation Restrictions Church;Cleaning;Community Activity;Driving;Laundry;Medication Management;Meal Prep;Shop;Volunteer;Yard Work    Stability/Clinical Decision Making Unstable/Unpredictable    Rehab Potential Fair    PT Frequency 1x / week    PT Duration 12 weeks    PT  Treatment/Interventions Patient/family education;Manual techniques;Therapeutic activities;Therapeutic exercise;Electrical Stimulation;Aquatic Therapy;DME Instruction;Functional mobility training;Gait training;Neuromuscular re-education;ADLs/Self Care Home Management;Cryotherapy;Moist Heat;Stair training;Orthotic Fit/Training;Energy conservation    PT Next Visit Plan LE stretching, balance, gait    PT Home Exercise Plan Long sitting hamstring stretch, prone quad stretch, figure 4/FABER stretch, standing/long sitting calf stretch    Consulted and Agree with Plan of Care Patient             Patient will benefit from skilled therapeutic intervention in order to improve the following deficits and impairments:  Abnormal gait, Decreased balance, Decreased endurance, Decreased mobility, Difficulty walking, Cardiopulmonary status limiting activity, Decreased activity tolerance, Decreased safety awareness, Decreased strength  Visit Diagnosis: Muscle weakness (generalized)  Unsteadiness on feet     Problem List Patient Active Problem List   Diagnosis Date Noted   Loss of weight 07/17/2015   Depression 07/17/2015   Cerebral palsy ([place]) 07/17/2015    Haston Casebolt, PT, DPT 08/02/2021, 2:01 PM  [place] MAIN [facility] SERVICES [address] [place], [place], [address] Phone: [phone]   Fax:  [phone]  Name: Renee Walton MRN: [id number] Date of Birth: 07/09/1996

## 2021-08-09 ENCOUNTER — Ambulatory Visit: Payer: Medicaid Other | Admitting: Physical Therapy

## 2021-08-16 ENCOUNTER — Ambulatory Visit: Payer: Medicaid Other

## 2021-08-23 ENCOUNTER — Ambulatory Visit: Payer: Medicaid Other

## 2021-09-27 ENCOUNTER — Ambulatory Visit: Payer: Medicaid Other | Admitting: Physical Therapy

## 2021-10-04 ENCOUNTER — Ambulatory Visit: Payer: Medicaid Other | Admitting: Physical Therapy

## 2021-10-08 ENCOUNTER — Other Ambulatory Visit: Payer: Self-pay

## 2021-10-08 ENCOUNTER — Ambulatory Visit: Payer: Medicaid Other | Attending: Internal Medicine | Admitting: Physical Therapy

## 2021-10-08 DIAGNOSIS — G809 Cerebral palsy, unspecified: Secondary | ICD-10-CM | POA: Diagnosis present

## 2021-10-08 DIAGNOSIS — R2689 Other abnormalities of gait and mobility: Secondary | ICD-10-CM | POA: Insufficient documentation

## 2021-10-08 DIAGNOSIS — M6281 Muscle weakness (generalized): Secondary | ICD-10-CM | POA: Diagnosis not present

## 2021-10-08 DIAGNOSIS — R278 Other lack of coordination: Secondary | ICD-10-CM | POA: Diagnosis present

## 2021-10-08 DIAGNOSIS — R2681 Unsteadiness on feet: Secondary | ICD-10-CM | POA: Diagnosis present

## 2021-10-08 NOTE — Therapy (Addendum)
[place] MA[facility] SERVICES [address] [place], [place], [address] Phone: [phone]   Fax:  [phone]  Physical Therapy Treatment  Patient Details  Name: Renee Walton MRN: [id number] Date of Birth: Oct 15, 1996 Referring Provider (PT): Perrin Maltese MD   Encounter Date: 10/08/2021   PT End of Session - 10/08/21 1208     Visit Number 19    Number of Visits 27    Date for PT Re-Evaluation 12/31/21    Authorization Type Medicaid (27 annual visit PT/OT)    Authorization Time Period authorized 9 visits 10/13-12/14    Authorization - Visit Number 1    Authorization - Number of Visits 9    PT Start Time 9675    PT Stop Time 9163    PT Time Calculation (min) 40 min    Equipment Utilized During Treatment Gait belt    Activity Tolerance Patient tolerated treatment well;No increased pain    Behavior During Therapy WFL for tasks assessed/performed             Past Medical History:  Diagnosis Date   Breakthrough bleeding    Cerebral palsy (HCC)    GAD (generalized anxiety disorder)    Migraine headache     Past Surgical History:  Procedure Laterality Date   LEG SURGERY     bilateral x2 in 2008    There were no vitals filed for this visit.   Subjective Assessment - 10/08/21 1200     Subjective Patient reports she has been unable to walk for the last month. She was sick with dehydration (2-3 days), She did have to get IV fluids in [place] but then was discharged home. She was in [place] for about 2 months. She was unable to do Physical Therapy while over there.    Pertinent History Pt returns to therapy after previous bout of CP. Pt states her major goal with therapy is to improve her mobility as much as possible before returning to [place] this upcoming September. Pt states [place] is not as ADA compliant for her as the [place] and wants to improve her walking and balance to feel more confident because she can not  bring her walker on the airplane. Pt reports meeting with MD about possible foot alignment surgery on April 19th and her MD in [place] wants to perform a hip adductor release. 2 falls have occurred in last 6 months due to balance loss but no injury and able to stand indep with use of UE's to helpherself up. Pt does not specify which direction her LOB occurs. Pt uses gym 2x/week for stretching and leg exercises and is motivated to improve her mobility.    Currently in Pain? Yes    Pain Score 4     Pain Location Leg    Pain Orientation Left    Pain Descriptors / Indicators Tightness    Pain Type Chronic pain    Pain Onset More than a month ago    Pain Frequency Intermittent    Aggravating Factors  worse with movement    Pain Relieving Factors massage/heat/rest    Effect of Pain on Daily Activities decreased activity tolerance;                [facility] PT Assessment - 10/08/21 0001       Strength   Right Hip Flexion 2/5    Right Hip Extension 3-/5    Right Hip ABduction 2-/5    Left Hip Flexion  2+/5    Left Hip Extension 3-/5    Left Hip ABduction 1/5    Right Knee Flexion 2/5    Left Knee Flexion 3/5      Standardized Balance Assessment   Five times sit to stand comments  11.5 sec with arms across chest (>10 sec indicates high risk for falls), more impaired than 07/05/21 which was 9.10 sec    10 Meter Walk 0.5 m/s without AD, min A, high risk for falls, more impaired than 9/15 which was 0.99 m/s               TREATMENT: PT performed passive hamstring stretch 30 sec hold x1 rep each  PT instructed patient in LE strengthening: SLR hip flexion 2x10 AAROM with cues to keep knee straight for better hip strengthening and motor control Sidelying hip abduction clamshell AAROM 2x10 reps each LE, patient unable to achieve full clamshell ROM due to weakness;  Prone: Hip extension AAROM x10 reps  Hamstring curl AROM on LLE, AAROM on RLE x10 reps   Bridges x20 reps, min A for  proper positioning  Required min A to transition to sitting;   Instructed patient in 10 meter walk and 5 times sit<>Stand.  Patient tolerated session fair. She does exhibit increased weakness in LE which is likely contributing to impaired gait and imbalance. Instructed patient on importance of increasing LE strengthening exercise.                     PT Education - 10/08/21 1202     Education provided Yes    Education Details progress towards goals;    Person(s) Educated Patient    Methods Explanation;Verbal cues    Comprehension Verbalized understanding;Returned demonstration;Verbal cues required;Need further instruction              PT Short Term Goals - 10/08/21 1509       PT SHORT TERM GOAL #1   Title Pt will be indep with performing HEP 3x/week to improve LE tone and strength for improved safety with at home ambulation and ADL's.    Baseline 3/10: Given LE stretches, 6/16: going to gym 1x a week, doing stretches 1x a week; 9/15: 2x a week going to gym;    Time 4    Period Weeks    Status Not Met    Target Date 11/05/21               PT Long Term Goals - 10/08/21 1227       PT LONG TERM GOAL #1   Title Patient will increase Berg Balance score by > 6 points to demonstrate decreased fall risk during functional activities.    Baseline 12/28/20: 32/56 6/16:33/56, 9/15: 35/56, 12/19: deferred    Time 12    Period Weeks    Status Partially Met    Target Date 12/31/21      PT LONG TERM GOAL #2   Title Pt will reduce 5xsSTS time to < 8 sec to demonstrate clinically significant improvement in LE strength and balance to reduce risk of falls.    Baseline 3/10: 10.33 sec no UE support, 6/16: 6 sec with arms across chest (partial ROM), but requires 12 sec for full sit to stand; 9/15: 9.10 sec with arms across chest; 12/19: 11 sec with arms across chest    Time 12    Period Weeks    Status Partially Met    Target Date 12/31/21  PT LONG TERM GOAL #3    Title Pt will improve 6 MWT to > 1000' with no AD and no LOB to demonstrate ability to improve safety with community distances.    Baseline 3/10: 510', 6/16: 920 feet with CGA, 9/15: 850 feet, 12/19: deferred;    Time 12    Period Weeks    Status Partially Met    Target Date 12/31/21      PT LONG TERM GOAL #4   Title Pt will improve 10 m gait speed with no AD > 1.0 m/s to demonstrate reduced risk of falls along with safe ability to complete community walking tasks.    Baseline 3/10: 0.78 m/s, 6/16: 0.84 m/s, 9/15: 0.99 m/s, 12/19: 0.5 m/s    Time 12    Period Weeks    Status Not Met    Target Date 12/31/21      PT LONG TERM GOAL #5   Title Pt will improve FOTO score to 64 demonstrate perceived improvements in ability to complete ADL's.    Baseline 3/10: Risk adjusted 51; 04/19/21: 59, 9/15: 63%, 12/19: deferred;    Time 12    Period Weeks    Status On-going    Target Date 12/31/21                   Plan - 10/08/21 1253     Clinical Impression Statement Patient motivated and participated fair within session. She missed 2 months of PT being in [place]. [place] just [place] the states last [place]. Patient reports for the last month she has been unable to walk on her own. She has been able to walk with walker and with HHA. She denies any new falls. PT assessed goals. She does exhibit increased weakness in LE. She reports doing some exercise at home over last several months but was unable to get PT in [place]. She also recently suffered from dehydration which could be contributing to LE weakness. Patient would benefit from additional skilled PT Intervention to improve strength, balance and mobility;    Personal Factors and Comorbidities Comorbidity 2;Fitness;Past/Current Experience;Time since onset of injury/illness/exacerbation;Age    Comorbidities CP and anxiety;    Examination-Activity Limitations Bed Mobility    Examination-Participation Restrictions  Church;Cleaning;Community Activity;Driving;Laundry;Medication Management;Meal Prep;Shop;Volunteer;Yard Work    Stability/Clinical Decision Making Unstable/Unpredictable    Rehab Potential Fair    PT Frequency 1x / week    PT Duration 12 weeks    PT Treatment/Interventions Patient/family education;Manual techniques;Therapeutic activities;Therapeutic exercise;Electrical Stimulation;Aquatic Therapy;DME Instruction;Functional mobility training;Gait training;Neuromuscular re-education;ADLs/Self Care Home Management;Cryotherapy;Moist Heat;Stair training;Orthotic Fit/Training;Energy conservation    PT Next Visit Plan LE stretching, balance, gait    PT Home Exercise Plan Long sitting hamstring stretch, prone quad stretch, figure 4/FABER stretch, standing/long sitting calf stretch    Consulted and Agree with Plan of Care Patient             Patient will benefit from skilled therapeutic intervention in order to improve the following deficits and impairments:  Abnormal gait, Decreased balance, Decreased endurance, Decreased mobility, Difficulty walking, Cardiopulmonary status limiting activity, Decreased activity tolerance, Decreased safety awareness, Decreased strength  Visit Diagnosis: Muscle weakness (generalized) - Plan: PT plan of care cert/re-cert  Unsteadiness on feet - Plan: PT plan of care cert/re-cert     Problem List Patient Active Problem List   Diagnosis Date Noted   Loss of weight 07/17/2015   Depression 07/17/2015   Cerebral palsy ([place]) 07/17/2015    Chandrea Zellman, PT, DPT 10/08/2021, 3:11  PM  [place] MAIN [facility] SERVICES [address] [place], [place], [address] Phone: [phone]   Fax:  [phone]  Name: Renee Walton MRN: [id number] Date of Birth: Jun 11, 1996

## 2021-10-08 NOTE — Addendum Note (Signed)
Addended by: Zettie Pho E on: 10/08/2021 03:12 PM   Modules accepted: Orders

## 2021-10-09 ENCOUNTER — Ambulatory Visit: Payer: Medicaid Other | Admitting: Physical Therapy

## 2021-10-11 ENCOUNTER — Ambulatory Visit: Payer: Medicaid Other

## 2021-10-11 ENCOUNTER — Ambulatory Visit: Payer: Medicaid Other | Admitting: Physical Therapy

## 2021-10-11 ENCOUNTER — Other Ambulatory Visit: Payer: Self-pay

## 2021-10-11 DIAGNOSIS — G809 Cerebral palsy, unspecified: Secondary | ICD-10-CM

## 2021-10-11 DIAGNOSIS — R278 Other lack of coordination: Secondary | ICD-10-CM

## 2021-10-11 DIAGNOSIS — M6281 Muscle weakness (generalized): Secondary | ICD-10-CM

## 2021-10-11 DIAGNOSIS — R2689 Other abnormalities of gait and mobility: Secondary | ICD-10-CM

## 2021-10-11 DIAGNOSIS — R2681 Unsteadiness on feet: Secondary | ICD-10-CM

## 2021-10-11 NOTE — Therapy (Signed)
[place] MAIN [facility] SERVICES [address] [place], [place], [address] Phone: [phone]   Fax:  [phone]  Physical Therapy Treatment/Physical Therapy Progress Note   Dates of reporting period  04/19/2021   to   10/11/2021   Patient Details  Name: Renee Walton MRN: [id number] Date of Birth: 1996/04/25 Referring Provider (PT): Perrin Maltese MD   Encounter Date: 10/11/2021   PT End of Session - 10/11/21 1616     Visit Number 20    Number of Visits 27    Date for PT Re-Evaluation 12/31/21    Authorization Type Medicaid (27 annual visit PT/OT)    Authorization Time Period authorized 9 visits 10/13-12/14    Authorization - Visit Number 1    Authorization - Number of Visits 9    PT Start Time 3151    PT Stop Time 1231    PT Time Calculation (min) 40 min    Equipment Utilized During Treatment Gait belt    Activity Tolerance Patient tolerated treatment well;No increased pain    Behavior During Therapy WFL for tasks assessed/performed             Past Medical History:  Diagnosis Date   Breakthrough bleeding    Cerebral palsy (HCC)    GAD (generalized anxiety disorder)    Migraine headache     Past Surgical History:  Procedure Laterality Date   LEG SURGERY     bilateral x2 in 2008    There were no vitals filed for this visit.   Subjective Assessment - 10/11/21 1614     Subjective Pt reports no pain currently. She reports she is excited because she was able to take steps at home without her walker for the first time in a while since being sick.    Patient is accompained by: Family member    Pertinent History Pt returns to therapy after previous bout of CP. Pt states her major goal with therapy is to improve her mobility as much as possible before returning to [place] this upcoming September. Pt states [place] is not as ADA compliant for her as the [place] and wants to improve her walking and balance to feel more  confident because she can not bring her walker on the airplane. Pt reports meeting with MD about possible foot alignment surgery on April 19th and her MD in [place] wants to perform a hip adductor release. 2 falls have occurred in last 6 months due to balance loss but no injury and able to stand indep with use of UE's to helpherself up. Pt does not specify which direction her LOB occurs. Pt uses gym 2x/week for stretching and leg exercises and is motivated to improve her mobility.    Limitations Standing;Walking    How long can you sit comfortably? Unlimited    How long can you stand comfortably? 2 hours    How long can you walk comfortably? 30 min    Patient Stated Goals Improve mobility so pt can safely walk in [place].    Currently in Pain? No/denies    Pain Onset More than a month ago              TREATMENT:  Ambulation for endurance for 1x115 ft around clinic with RW, CGA.   Pt then performs ambulation in // bars without AD forward/backward with close CGA 4x each direction.   On mat table: PT provided passive hamstring stretch 45  sec hold x1 rep each LE  PT instructed patient in LE strengthening: SLR hip flexion 3x10 AAROM with continues cues to keep knee straight for better hip strengthening and motor control as well as cuing for eccentric control; pt rates as challenging  Bridges 3x10reps, continued min A for proper positioning; pt rates as medium  Sidelying hip abduction clamshell AAROM 1x8 L, 1x10 L, 2x10 R reps , patient continues to be unable to achieve full clamshell ROM due to weakness; pt reports this intervention is "very hard," PT must provide assist with intervention.  STS 3x10 cuing for eccentirc control  Pt educated throughout session about proper posture and technique with exercises. Improved exercise technique, movement at target joints, use of target muscles after min to mod verbal, visual, tactile cues.     PT Education - 10/11/21 1615     Education  provided Yes    Education Details exercies technique, body mechanics    Person(s) Educated Patient    Methods Explanation;Demonstration;Tactile cues;Verbal cues    Comprehension Verbalized understanding;Returned demonstration;Verbal cues required;Tactile cues required;Need further instruction              PT Short Term Goals - 10/08/21 1509       PT SHORT TERM GOAL #1   Title Pt will be indep with performing HEP 3x/week to improve LE tone and strength for improved safety with at home ambulation and ADL's.    Baseline 3/10: Given LE stretches, 6/16: going to gym 1x a week, doing stretches 1x a week; 9/15: 2x a week going to gym;    Time 4    Period Weeks    Status Not Met    Target Date 11/05/21               PT Long Term Goals - 10/08/21 1227       PT LONG TERM GOAL #1   Title Patient will increase Berg Balance score by > 6 points to demonstrate decreased fall risk during functional activities.    Baseline 12/28/20: 32/56 6/16:33/56, 9/15: 35/56, 12/19: deferred    Time 12    Period Weeks    Status Partially Met    Target Date 12/31/21      PT LONG TERM GOAL #2   Title Pt will reduce 5xsSTS time to < 8 sec to demonstrate clinically significant improvement in LE strength and balance to reduce risk of falls.    Baseline 3/10: 10.33 sec no UE support, 6/16: 6 sec with arms across chest (partial ROM), but requires 12 sec for full sit to stand; 9/15: 9.10 sec with arms across chest; 12/19: 11 sec with arms across chest    Time 12    Period Weeks    Status Partially Met    Target Date 12/31/21      PT LONG TERM GOAL #3   Title Pt will improve 6 MWT to > 1000' with no AD and no LOB to demonstrate ability to improve safety with community distances.    Baseline 3/10: 510', 6/16: 920 feet with CGA, 9/15: 850 feet, 12/19: deferred;    Time 12    Period Weeks    Status Partially Met    Target Date 12/31/21      PT LONG TERM GOAL #4   Title Pt will improve 10 m gait speed  with no AD > 1.0 m/s to demonstrate reduced risk of falls along with safe ability to complete community walking tasks.    Baseline 3/10: 0.78 m/s, 6/16: 0.84 m/s, 9/15: 0.99  m/s, 12/19: 0.5 m/s    Time 12    Period Weeks    Status Not Met    Target Date 12/31/21      PT LONG TERM GOAL #5   Title Pt will improve FOTO score to 64 demonstrate perceived improvements in ability to complete ADL's.    Baseline 3/10: Risk adjusted 51; 04/19/21: 59, 9/15: 63%, 12/19: deferred;    Time 12    Period Weeks    Status On-going    Target Date 12/31/21                   Plan - 10/11/21 1620     Clinical Impression Statement Pt highly motivated to participate in session. Pt able to ambulate with RW for 115 ft and in // bars forward/backward without AD for 4 reps each direction. Pt still challenged most with clamshells and had difficulty with maintaining correct positioning and eccentric control with SLR. Pt will benefit from further skilled PT to improve strength, balance and mobility.    Personal Factors and Comorbidities Comorbidity 2;Fitness;Past/Current Experience;Time since onset of injury/illness/exacerbation;Age    Comorbidities CP and anxiety;    Examination-Activity Limitations Bed Mobility    Examination-Participation Restrictions Church;Cleaning;Community Activity;Driving;Laundry;Medication Management;Meal Prep;Shop;Volunteer;Yard Work    Stability/Clinical Decision Making Unstable/Unpredictable    Rehab Potential Fair    PT Frequency 1x / week    PT Duration 12 weeks    PT Treatment/Interventions Patient/family education;Manual techniques;Therapeutic activities;Therapeutic exercise;Electrical Stimulation;Aquatic Therapy;DME Instruction;Functional mobility training;Gait training;Neuromuscular re-education;ADLs/Self Care Home Management;Cryotherapy;Moist Heat;Stair training;Orthotic Fit/Training;Energy conservation    PT Next Visit Plan LE stretching, balance, gait    PT Home Exercise  Plan Long sitting hamstring stretch, prone quad stretch, figure 4/FABER stretch, standing/long sitting calf stretch; no updates    Consulted and Agree with Plan of Care Patient             Patient will benefit from skilled therapeutic intervention in order to improve the following deficits and impairments:  Abnormal gait, Decreased balance, Decreased endurance, Decreased mobility, Difficulty walking, Cardiopulmonary status limiting activity, Decreased activity tolerance, Decreased safety awareness, Decreased strength  Visit Diagnosis: Muscle weakness (generalized)  Other abnormalities of gait and mobility  Unsteadiness on feet  Other lack of coordination  Cerebral palsy, unspecified type [facility])     Problem List Patient Active Problem List   Diagnosis Date Noted   Loss of weight 07/17/2015   Depression 07/17/2015   Cerebral palsy ([place]) 07/17/2015    Zollie Pee, PT 10/11/2021, 4:23 PM  [place] MAIN [facility] SERVICES [address] [place], [place], [address] Phone: [phone]   Fax:  [phone]  Name: Renee Walton MRN: [id number] Date of Birth: [birth year]

## 2021-10-16 ENCOUNTER — Ambulatory Visit: Payer: Medicaid Other | Admitting: Physical Therapy

## 2021-10-18 ENCOUNTER — Ambulatory Visit: Payer: Medicaid Other | Admitting: Physical Therapy

## 2021-10-18 ENCOUNTER — Other Ambulatory Visit: Payer: Self-pay

## 2021-10-18 DIAGNOSIS — R2681 Unsteadiness on feet: Secondary | ICD-10-CM

## 2021-10-18 DIAGNOSIS — M6281 Muscle weakness (generalized): Secondary | ICD-10-CM | POA: Diagnosis not present

## 2021-10-18 DIAGNOSIS — R2689 Other abnormalities of gait and mobility: Secondary | ICD-10-CM

## 2021-10-18 NOTE — Therapy (Signed)
[place] MAIN [facility] SERVICES [address] [place], [place], [address] Phone: [phone]   Fax:  [phone]  Physical Therapy Treatment  Patient Details  Name: Renee Walton MRN: [id number] Date of Birth: Jul 01, 1996 Referring Provider (PT): Perrin Maltese MD   Encounter Date: 10/18/2021   PT End of Session - 10/18/21 1414     Visit Number 21    Number of Visits 27    Date for PT Re-Evaluation 12/31/21    Authorization Type Medicaid (27 annual visit PT/OT)    Authorization Time Period authorized 9 visits 10/13-12/14    Authorization - Visit Number 1    Authorization - Number of Visits 9    PT Start Time 5093    PT Stop Time 1230    PT Time Calculation (min) 42 min    Equipment Utilized During Treatment Gait belt    Activity Tolerance Patient tolerated treatment well;No increased pain    Behavior During Therapy WFL for tasks assessed/performed             Past Medical History:  Diagnosis Date   Breakthrough bleeding    Cerebral palsy (HCC)    GAD (generalized anxiety disorder)    Migraine headache     Past Surgical History:  Procedure Laterality Date   LEG SURGERY     bilateral x2 in 2008    There were no vitals filed for this visit.   Subjective Assessment - 10/18/21 1154     Subjective Pt rpeorts some pain in the left hip flexor region and reports it  feels "tight". Reports she has been walking at home without AD with no falls or LOB.    Patient is accompained by: Family member    Pertinent History Pt returns to therapy after previous bout of CP. Pt states her major goal with therapy is to improve her mobility as much as possible before returning to [place] this upcoming September. Pt states [place] is not as ADA compliant for her as the [place] and wants to improve her walking and balance to feel more confident because she can not bring her walker on the airplane. Pt reports meeting with MD about possible foot  alignment surgery on April 19th and her MD in [place] wants to perform a hip adductor release. 2 falls have occurred in last 6 months due to balance loss but no injury and able to stand indep with use of UE's to helpherself up. Pt does not specify which direction her LOB occurs. Pt uses gym 2x/week for stretching and leg exercises and is motivated to improve her mobility.    Limitations Standing;Walking    How long can you sit comfortably? Unlimited    How long can you stand comfortably? 2 hours    How long can you walk comfortably? 30 min    Patient Stated Goals Improve mobility so pt can safely walk in [place].    Pain Score 2     Pain Location Leg    Pain Orientation Left    Pain Onset More than a month ago              TREATMENT: Unless otherwise stated, CGA was provided and gait belt donned in order to ensure pt safety  Ambulation for endurance for 1x65 ft around clinic with RW, CGA.   Pt then performs ambulation in // bars without AD forward/backward with close CGA 4x each direction.   Ambulation 65 feet with no AD and CGA  Ambulated approximately 80 feet with cues for stopping and starting in order to improve ambulation with increased distractions.  Patient did have 1 loss of balance which required max assist for PT to prevent fall.  Following this patient able to ambulate including stopping and going without loss of balance for remainder of this ambulatory bout.  On mat table: Manual  PT provided passive hamstring stretch 45  sec hold x1 rep each LE Passive R hip flexor stretch 2 x 45 sec in SL.    Therex PT instructed patient in LE strengthening:  SLR hip flexion 2x10 AAROM with continues cues to keep knee straight for better hip strengthening and motor control as well as cuing for eccentric control; required A from PT to keep knee extended  Bridges 3x10reps, continued min A for proper positioning; pt rates as medium  Attempted SL clamshell, pt unable to obtain  full ROM. X 10 on ea side with AAROM for full movement  Supine clamshell 2 x 10  -attempted supine clam with YTB but pt unable to complete through full ROM  STS 3x10 cuing for eccentirc control   Pt educated throughout session about proper posture and technique with exercises. Improved exercise technique, movement at target joints, use of target muscles after min to mod verbal, visual, tactile cues.                            PT Education - 10/18/21 1413     Education Details exercise form and technique    Person(s) Educated Patient    Methods Explanation;Demonstration;Tactile cues    Comprehension Verbalized understanding              PT Short Term Goals - 10/11/21 1624       PT SHORT TERM GOAL #1   Title Pt will be indep with performing HEP 3x/week to improve LE tone and strength for improved safety with at home ambulation and ADL's.    Baseline 3/10: Given LE stretches, 6/16: going to gym 1x a week, doing stretches 1x a week; 9/15: 2x a week going to gym;    Time 4    Period Weeks    Status Not Met    Target Date 11/05/21               PT Long Term Goals - 10/11/21 1624       PT LONG TERM GOAL #1   Title Patient will increase Berg Balance score by > 6 points to demonstrate decreased fall risk during functional activities.    Baseline 12/28/20: 32/56 6/16:33/56, 9/15: 35/56, 12/19: deferred    Time 12    Period Weeks    Status Partially Met    Target Date 12/31/21      PT LONG TERM GOAL #2   Title Pt will reduce 5xsSTS time to < 8 sec to demonstrate clinically significant improvement in LE strength and balance to reduce risk of falls.    Baseline 3/10: 10.33 sec no UE support, 6/16: 6 sec with arms across chest (partial ROM), but requires 12 sec for full sit to stand; 9/15: 9.10 sec with arms across chest; 12/19: 11 sec with arms across chest    Time 12    Period Weeks    Status Partially Met    Target Date 12/31/21      PT LONG  TERM GOAL #3   Title Pt will improve 6 MWT to > 1000' with  no AD and no LOB to demonstrate ability to improve safety with community distances.    Baseline 3/10: 510', 6/16: 920 feet with CGA, 9/15: 850 feet, 12/19: deferred;    Time 12    Period Weeks    Status Partially Met    Target Date 12/31/21      PT LONG TERM GOAL #4   Title Pt will improve 10 m gait speed with no AD > 1.0 m/s to demonstrate reduced risk of falls along with safe ability to complete community walking tasks.    Baseline 3/10: 0.78 m/s, 6/16: 0.84 m/s, 9/15: 0.99 m/s, 12/19: 0.5 m/s    Time 12    Period Weeks    Status Not Met    Target Date 12/31/21      PT LONG TERM GOAL #5   Title Pt will improve FOTO score to 64 demonstrate perceived improvements in ability to complete ADL's.    Baseline 3/10: Risk adjusted 51; 04/19/21: 59, 9/15: 63%, 12/19: deferred;    Time 12    Period Weeks    Status On-going    Target Date 12/31/21                   Plan - 10/18/21 1414     Clinical Impression Statement Patient participated well within physical therapy session.  Continue to work on hip strengthening as well as lower extremity stretching and mobility exercises.  Also continue to work on ambulation stability and endurance.  Patient did have 1 loss of balance and required max assist to be PT and to prevent fall when ambulating in clinic with physical therapist and without assistive device.  Patient will continue to benefit from skilled physical therapy intervention in order to improve her strength, balance, and mobility.    Personal Factors and Comorbidities Comorbidity 2;Fitness;Past/Current Experience;Time since onset of injury/illness/exacerbation;Age    Comorbidities CP and anxiety;    Examination-Activity Limitations Bed Mobility    Examination-Participation Restrictions Church;Cleaning;Community Activity;Driving;Laundry;Medication Management;Meal Prep;Shop;Volunteer;Yard Work    Stability/Clinical Decision  Making Unstable/Unpredictable    Rehab Potential Fair    PT Frequency 1x / week    PT Duration 12 weeks    PT Treatment/Interventions Patient/family education;Manual techniques;Therapeutic activities;Therapeutic exercise;Electrical Stimulation;Aquatic Therapy;DME Instruction;Functional mobility training;Gait training;Neuromuscular re-education;ADLs/Self Care Home Management;Cryotherapy;Moist Heat;Stair training;Orthotic Fit/Training;Energy conservation    PT Next Visit Plan LE stretching, balance, gait    PT Home Exercise Plan Long sitting hamstring stretch, prone quad stretch, figure 4/FABER stretch, standing/long sitting calf stretch; no updates    Consulted and Agree with Plan of Care Patient             Patient will benefit from skilled therapeutic intervention in order to improve the following deficits and impairments:  Abnormal gait, Decreased balance, Decreased endurance, Decreased mobility, Difficulty walking, Cardiopulmonary status limiting activity, Decreased activity tolerance, Decreased safety awareness, Decreased strength  Visit Diagnosis: No diagnosis found.     Problem List Patient Active Problem List   Diagnosis Date Noted   Loss of weight 07/17/2015   Depression 07/17/2015   Cerebral palsy ([place]) 07/17/2015    Particia Lather, PT 10/18/2021, 2:19 PM  [place] MAIN [facility] SERVICES [address] [place], [place], [address] Phone: [phone]   Fax:  [phone]  Name: Renee Walton MRN: [id number] Date of Birth: 03-13-1996

## 2021-10-23 ENCOUNTER — Ambulatory Visit: Payer: Medicaid Other | Admitting: Physical Therapy

## 2021-10-25 ENCOUNTER — Ambulatory Visit: Payer: Medicaid Other | Admitting: Physical Therapy

## 2021-10-29 ENCOUNTER — Other Ambulatory Visit: Payer: Self-pay

## 2021-10-29 ENCOUNTER — Ambulatory Visit: Payer: Medicaid Other | Attending: Internal Medicine | Admitting: Physical Therapy

## 2021-10-29 ENCOUNTER — Encounter: Payer: Self-pay | Admitting: Physical Therapy

## 2021-10-29 DIAGNOSIS — R2689 Other abnormalities of gait and mobility: Secondary | ICD-10-CM | POA: Diagnosis present

## 2021-10-29 DIAGNOSIS — R2681 Unsteadiness on feet: Secondary | ICD-10-CM | POA: Insufficient documentation

## 2021-10-29 DIAGNOSIS — R278 Other lack of coordination: Secondary | ICD-10-CM | POA: Insufficient documentation

## 2021-10-29 DIAGNOSIS — M6281 Muscle weakness (generalized): Secondary | ICD-10-CM | POA: Diagnosis present

## 2021-10-29 NOTE — Patient Instructions (Signed)
Weight machines most important to do at gym: -hip abductor machine -Hamstring curl machine- can do both legs, would be nice if Renee Walton is able to progress to single leg as right leg is weaker than left leg -Leg press- same as hamstring curl, able to do both legs but would be good if she is able to progress to single leg, tone may make this difficult  We also started doing prone plank- she will require assistance to position feet and hold heels apart as when she lifts up on forearms her hip adductor tone kicks in and legs come together. Able to hold for 10-15 sec with good form.  Other weight machines are good to do, but the ones listed above are important for leg strengthening Avoid hip adductor machine.

## 2021-10-29 NOTE — Therapy (Signed)
[place] MAIN [facility] SERVICES [address] [place], [place], [address] Phone: [phone]   Fax:  [phone]  Physical Therapy Treatment  Patient Details  Name: Renee Walton MRN: [id number] Date of Birth: 1996-03-10 Referring Provider (PT): Renee Maltese MD   Encounter Date: 10/29/2021   PT End of Session - 10/29/21 1150     Visit Number 22    Number of Visits 27    Date for PT Re-Evaluation 12/31/21    Authorization Type Medicaid (27 annual visit PT/OT)    Authorization Time Period authorized 3 visits 1/9-1/29    Authorization - Visit Number 1    Authorization - Number of Visits 3    PT Start Time 6063    PT Stop Time 1228    PT Time Calculation (min) 40 min    Equipment Utilized During Treatment Gait belt    Activity Tolerance Patient tolerated treatment well;No increased pain    Behavior During Therapy WFL for tasks assessed/performed             Past Medical History:  Diagnosis Date   Breakthrough bleeding    Cerebral palsy (HCC)    GAD (generalized anxiety disorder)    Migraine headache     Past Surgical History:  Procedure Laterality Date   LEG SURGERY     bilateral x2 in 2008    There were no vitals filed for this visit.   Subjective Assessment - 10/29/21 1148     Subjective Patient reports starting back at [employer] 2x a weeks; She is doing leg press, treadmill, and some arm resistance; She is also doing hip abductor machine at 20#; She has been walking some at home.    Patient is accompained by: Family member    Pertinent History Pt returns to therapy after previous bout of CP. Pt states her major goal with therapy is to improve her mobility as much as possible before returning to [place] this upcoming September. Pt states [place] is not as ADA compliant for her as the [place] and wants to improve her walking and balance to feel more confident because she can not bring her walker on the airplane.  Pt reports meeting with MD about possible foot alignment surgery on April 19th and her MD in [place] wants to perform a hip adductor release. 2 falls have occurred in last 6 months due to balance loss but no injury and able to stand indep with use of UE's to helpherself up. Pt does not specify which direction her LOB occurs. Pt uses gym 2x/week for stretching and leg exercises and is motivated to improve her mobility.    Limitations Standing;Walking    How long can you sit comfortably? Unlimited    How long can you stand comfortably? 2 hours    How long can you walk comfortably? 30 min    Patient Stated Goals Improve mobility so pt can safely walk in [place].    Currently in Pain? No/denies    Pain Onset More than a month ago    Multiple Pain Sites No                TREATMENT: Patient required min A to transfer wheelchair to mat table  Patient hooklying: Bridges with arms across chest x15 reps Hip abduction/ER red tband x10 reps with AAROM  SLR hip flexion x10 reps with mod VCS to keep knee extended; patient exhibits decreased motor control requiring min A for proper positioning and  often bends knee with difficulty keeping leg extended; *all done 2 sets, patient able to exhibit better ROM with 2nd set but continues to have decreased motor control;   Patient in sidelying: Clamshells, partial ROM x10 reps PT performed AAROM through full ROM x5 reps  Patient prone: Alternate LE hamstring curl, required AAROM on RLE x10 reps with decreased motor control and increased adductor tone requiring min A to isolate single leg  Attempted hip extension, unable  Instructed patient in prone plank on forearm 15 sec hold x3 reps with min A for foot positioning;  Patient supine: PT performed passive hip adductor stretch 30 sec hold x1 rep;  Following exercise, patient ambulated 80 feet without AD, required CGA with reciprocal gait pattern, uneven step length, narrow base of support with hip  ER and increased lateral trunk lean during each step to compensate for poor gluteal control/activation. Patient able to exhibit better step length and require less assistance this session compared to previous sessions;  She reports no pain at end of session. Educated patient on safe exercises to do at gym for better hip strengthening;                           PT Education - 10/29/21 1150     Education provided Yes    Education Details exercise/positioning;    Person(s) Educated Patient    Methods Explanation;Verbal cues    Comprehension Returned demonstration;Verbalized understanding;Verbal cues required;Need further instruction              PT Short Term Goals - 10/11/21 1624       PT SHORT TERM GOAL #1   Title Pt will be indep with performing HEP 3x/week to improve LE tone and strength for improved safety with at home ambulation and ADL's.    Baseline 3/10: Given LE stretches, 6/16: going to gym 1x a week, doing stretches 1x a week; 9/15: 2x a week going to gym;    Time 4    Period Weeks    Status Not Met    Target Date 11/05/21               PT Long Term Goals - 10/11/21 1624       PT LONG TERM GOAL #1   Title Patient will increase Berg Balance score by > 6 points to demonstrate decreased fall risk during functional activities.    Baseline 12/28/20: 32/56 6/16:33/56, 9/15: 35/56, 12/19: deferred    Time 12    Period Weeks    Status Partially Met    Target Date 12/31/21      PT LONG TERM GOAL #2   Title Pt will reduce 5xsSTS time to < 8 sec to demonstrate clinically significant improvement in LE strength and balance to reduce risk of falls.    Baseline 3/10: 10.33 sec no UE support, 6/16: 6 sec with arms across chest (partial ROM), but requires 12 sec for full sit to stand; 9/15: 9.10 sec with arms across chest; 12/19: 11 sec with arms across chest    Time 12    Period Weeks    Status Partially Met    Target Date 12/31/21      PT LONG  TERM GOAL #3   Title Pt will improve 6 MWT to > 1000' with no AD and no LOB to demonstrate ability to improve safety with community distances.    Baseline 3/10: 510', 6/16: 920 feet with CGA, 9/15:  850 feet, 12/19: deferred;    Time 12    Period Weeks    Status Partially Met    Target Date 12/31/21      PT LONG TERM GOAL #4   Title Pt will improve 10 m gait speed with no AD > 1.0 m/s to demonstrate reduced risk of falls along with safe ability to complete community walking tasks.    Baseline 3/10: 0.78 m/s, 6/16: 0.84 m/s, 9/15: 0.99 m/s, 12/19: 0.5 m/s    Time 12    Period Weeks    Status Not Met    Target Date 12/31/21      PT LONG TERM GOAL #5   Title Pt will improve FOTO score to 64 demonstrate perceived improvements in ability to complete ADL's.    Baseline 3/10: Risk adjusted 51; 04/19/21: 59, 9/15: 63%, 12/19: deferred;    Time 12    Period Weeks    Status On-going    Target Date 12/31/21                   Plan - 10/29/21 1458     Clinical Impression Statement Patient motivated and participated well within session. Instructed patient in LE strengthening focusing on hip strengthening. She does require min A for proper positioning and to help disassociate LE and reduce adductor tone with LE movement. She required AAROM for hip abduction to increase ROM due to weakness in gluteal muscles. Educated patient in optimal machines to use at gym for LE hip strengthening, see patient instructions. Instructed patient in prone plank exercise with moderate difficulty reported. Patient would benefit from additional skilled PT Intervention to improve strength and mobility and improve gait safety. She was able to walk this session requiring less assistance, but continues to exhibit short step length and increased lateral trunk lean;    Personal Factors and Comorbidities Comorbidity 2;Fitness;Past/Current Experience;Time since onset of injury/illness/exacerbation;Age    Comorbidities CP  and anxiety;    Examination-Activity Limitations Bed Mobility    Examination-Participation Restrictions Church;Cleaning;Community Activity;Driving;Laundry;Medication Management;Meal Prep;Shop;Volunteer;Yard Work    Stability/Clinical Decision Making Unstable/Unpredictable    Rehab Potential Fair    PT Frequency 1x / week    PT Duration 12 weeks    PT Treatment/Interventions Patient/family education;Manual techniques;Therapeutic activities;Therapeutic exercise;Electrical Stimulation;Aquatic Therapy;DME Instruction;Functional mobility training;Gait training;Neuromuscular re-education;ADLs/Self Care Home Management;Cryotherapy;Moist Heat;Stair training;Orthotic Fit/Training;Energy conservation    PT Next Visit Plan LE stretching, balance, gait    PT Home Exercise Plan Long sitting hamstring stretch, prone quad stretch, figure 4/FABER stretch, standing/long sitting calf stretch; no updates    Consulted and Agree with Plan of Care Patient             Patient will benefit from skilled therapeutic intervention in order to improve the following deficits and impairments:  Abnormal gait, Decreased balance, Decreased endurance, Decreased mobility, Difficulty walking, Cardiopulmonary status limiting activity, Decreased activity tolerance, Decreased safety awareness, Decreased strength  Visit Diagnosis: Other abnormalities of gait and mobility  Unsteadiness on feet  Muscle weakness (generalized)  Other lack of coordination     Problem List Patient Active Problem List   Diagnosis Date Noted   Loss of weight 07/17/2015   Depression 07/17/2015   Cerebral palsy ([place]) 07/17/2015    Trotter,Margaret, PT, DPT 10/29/2021, 3:05 PM  [place] [facility] MAIN [facility] SERVICES [address] [place], [place], [address] Phone: [phone]   Fax:  [phone]  Name: Renee Walton MRN: [id number] Date of Birth: Aug 09, 1996

## 2021-10-30 ENCOUNTER — Ambulatory Visit: Payer: Medicaid Other | Admitting: Physical Therapy

## 2021-11-01 ENCOUNTER — Ambulatory Visit: Payer: Medicaid Other | Admitting: Physical Therapy

## 2021-11-05 ENCOUNTER — Encounter: Payer: Self-pay | Admitting: Physical Therapy

## 2021-11-05 ENCOUNTER — Other Ambulatory Visit: Payer: Self-pay

## 2021-11-05 ENCOUNTER — Ambulatory Visit: Payer: Medicaid Other | Admitting: Physical Therapy

## 2021-11-05 DIAGNOSIS — M6281 Muscle weakness (generalized): Secondary | ICD-10-CM

## 2021-11-05 DIAGNOSIS — R2681 Unsteadiness on feet: Secondary | ICD-10-CM

## 2021-11-05 DIAGNOSIS — R2689 Other abnormalities of gait and mobility: Secondary | ICD-10-CM | POA: Diagnosis not present

## 2021-11-05 DIAGNOSIS — R278 Other lack of coordination: Secondary | ICD-10-CM

## 2021-11-05 NOTE — Patient Instructions (Signed)
Access Code: [email or web address] URL: [email or web address] Date: 11/05/2021 Prepared by: Zettie Pho  Exercises Single Leg Press - 1 x daily - 3 x weekly - 2 sets - 10 reps V Sit Leg Extension on BOSU Ball - 1 x daily - 3 x weekly - 1 sets - 10 reps Sit to Stand - 1 x daily - 3 x weekly - 1 sets - 10 reps

## 2021-11-05 NOTE — Therapy (Signed)
[place] MAIN [facility] SERVICES [address] [place], [place], [address] Phone: [phone]   Fax:  [phone]  Physical Therapy Treatment  Patient Details  Name: Renee Walton MRN: [id number] Date of Birth: Jul 09, 1996 Referring Provider (PT): Perrin Maltese MD   Encounter Date: 11/05/2021   PT End of Session - 11/05/21 1452     Visit Number 23    Number of Visits 27    Date for PT Re-Evaluation 12/31/21    Authorization Type Medicaid (27 annual visit PT/OT)    Authorization Time Period authorized 3 visits 1/9-1/29    Authorization - Visit Number 1    Authorization - Number of Visits 3    PT Start Time 2355    PT Stop Time 1230    PT Time Calculation (min) 42 min    Equipment Utilized During Treatment Gait belt    Activity Tolerance Patient tolerated treatment well;No increased pain    Behavior During Therapy WFL for tasks assessed/performed             Past Medical History:  Diagnosis Date   Breakthrough bleeding    Cerebral palsy (HCC)    GAD (generalized anxiety disorder)    Migraine headache     Past Surgical History:  Procedure Laterality Date   LEG SURGERY     bilateral x2 in 2008    There were no vitals filed for this visit.   Subjective Assessment - 11/05/21 1451     Subjective Patient reports doing well. She reports going to [employer]. She states the trainer has been helping her on the equipment and her mom/caregiver will also help. She states, "When I do the single leg, leg press my legs go together, is that okay?"    Patient is accompained by: Family member    Pertinent History Pt returns to therapy after previous bout of CP. Pt states her major goal with therapy is to improve her mobility as much as possible before returning to [place] this upcoming September. Pt states [place] is not as ADA compliant for her as the [place] and wants to improve her walking and balance to feel more confident because  she can not bring her walker on the airplane. Pt reports meeting with MD about possible foot alignment surgery on April 19th and her MD in [place] wants to perform a hip adductor release. 2 falls have occurred in last 6 months due to balance loss but no injury and able to stand indep with use of UE's to helpherself up. Pt does not specify which direction her LOB occurs. Pt uses gym 2x/week for stretching and leg exercises and is motivated to improve her mobility.    Limitations Standing;Walking    How long can you sit comfortably? Unlimited    How long can you stand comfortably? 2 hours    How long can you walk comfortably? 30 min    Patient Stated Goals Improve mobility so pt can safely walk in [place].    Currently in Pain? No/denies    Pain Onset More than a month ago    Multiple Pain Sites No                       TREATMENT: BLE leg press, educated patient in using ball between knees to prevent hip adduction/knee valgus position 25# x15 reps; Attempted single leg, but machine unable to go lighter than 25#, too difficult; Did educate pt in importance of  keeping hip/knee in lighter weight for better quad control and motor positioning  Standing in parallel bars: -side stepping yellow tband x10 feet x2 laps each with cues to keep foot forward to challenge hip abductors; often ER hip and compensates with hip flexors  Standing with small bolster between legs for increased base of support: -unsupported standing 30 sec hold -arms across chest trunk rotation x5 reps each direction -mini squat with cues for hip abduction/avoid knee valgus 3 sec hold x10 reps;  Instructed patient in advanced LE strengthening near mat table: Sit<>Stand on BOSU (on floor) with 1-0 UE assist, therapist providing CGA for safety x10 reps with good LE positioning. Patient does require anterior shift for better transfer ability. She reports moderate difficulty in LE;  Attempted seated on BOSU with BLE  leg lift to challenge core stabilization. Patient unable; Instructed patient in seated on mat table, BLE leg lift with hip flexion and knee extension on mat table in reclined position for core stabilization x10 reps (partial ROM). Patient denies any pain but does report moderate difficulty;  Advanced HEP- see patient instructions; Also recommend patient start walking at home for 3-5 min on level surface at home daily to improve cardiovascular conditioning and increase walking endurance;  She would benefit from additional skilled intervention to improve LE strength for better gait ability; Currently patient able to ambulate 20-30 feet without AD with CGA for safety and cues for upper trunk control for better balance. She ambulates with wide base of support and increased lateral trunk lean to compensate for weakness in gluteal muscles;                    PT Education - 11/05/21 1451     Education provided Yes    Education Details exercise/positioning;    Person(s) Educated Patient    Methods Explanation;Verbal cues    Comprehension Verbalized understanding;Returned demonstration;Verbal cues required;Need further instruction              PT Short Term Goals - 10/11/21 1624       PT SHORT TERM GOAL #1   Title Pt will be indep with performing HEP 3x/week to improve LE tone and strength for improved safety with at home ambulation and ADL's.    Baseline 3/10: Given LE stretches, 6/16: going to gym 1x a week, doing stretches 1x a week; 9/15: 2x a week going to gym;    Time 4    Period Weeks    Status Not Met    Target Date 11/05/21               PT Long Term Goals - 10/11/21 1624       PT LONG TERM GOAL #1   Title Patient will increase Berg Balance score by > 6 points to demonstrate decreased fall risk during functional activities.    Baseline 12/28/20: 32/56 6/16:33/56, 9/15: 35/56, 12/19: deferred    Time 12    Period Weeks    Status Partially Met    Target  Date 12/31/21      PT LONG TERM GOAL #2   Title Pt will reduce 5xsSTS time to < 8 sec to demonstrate clinically significant improvement in LE strength and balance to reduce risk of falls.    Baseline 3/10: 10.33 sec no UE support, 6/16: 6 sec with arms across chest (partial ROM), but requires 12 sec for full sit to stand; 9/15: 9.10 sec with arms across chest; 12/19: 11 sec with arms  across chest    Time 12    Period Weeks    Status Partially Met    Target Date 12/31/21      PT LONG TERM GOAL #3   Title Pt will improve 6 MWT to > 1000' with no AD and no LOB to demonstrate ability to improve safety with community distances.    Baseline 3/10: 510', 6/16: 920 feet with CGA, 9/15: 850 feet, 12/19: deferred;    Time 12    Period Weeks    Status Partially Met    Target Date 12/31/21      PT LONG TERM GOAL #4   Title Pt will improve 10 m gait speed with no AD > 1.0 m/s to demonstrate reduced risk of falls along with safe ability to complete community walking tasks.    Baseline 3/10: 0.78 m/s, 6/16: 0.84 m/s, 9/15: 0.99 m/s, 12/19: 0.5 m/s    Time 12    Period Weeks    Status Not Met    Target Date 12/31/21      PT LONG TERM GOAL #5   Title Pt will improve FOTO score to 64 demonstrate perceived improvements in ability to complete ADL's.    Baseline 3/10: Risk adjusted 51; 04/19/21: 59, 9/15: 63%, 12/19: deferred;    Time 12    Period Weeks    Status On-going    Target Date 12/31/21                   Plan - 11/05/21 1452     Clinical Impression Statement Patient motivated and participated well within session. PT instructed patient in proper positioning/form on leg press machine. Educated patient in using small ball between legs to reduce valgus position. Recommend patient use UE to hold LE in proper position with single leg, leg press to avoid valgus position of knee. Patient instructed in advanced strengthening utilizing resistance band as tolerated. She continues to have  increased hip adductor tone and weakness in gluteal muscles. Patient expressed desire for BOTOX and requested clarity that botox in adductors would not negatively affect gait. Patient educated how BOTOX in hip adductors would help reduce tone and therefore provide opportunity for hip abductors to strengthen. Patient instructed in advanced LE strengthening/core strengthening exercise as part of HEP. She would benefit from additional skilled PT Intervention to improve strength, balance and mobility;    Personal Factors and Comorbidities Comorbidity 2;Fitness;Past/Current Experience;Time since onset of injury/illness/exacerbation;Age    Comorbidities CP and anxiety;    Examination-Activity Limitations Bed Mobility    Examination-Participation Restrictions Church;Cleaning;Community Activity;Driving;Laundry;Medication Management;Meal Prep;Shop;Volunteer;Yard Work    Stability/Clinical Decision Making Unstable/Unpredictable    Rehab Potential Fair    PT Frequency 1x / week    PT Duration 12 weeks    PT Treatment/Interventions Patient/family education;Manual techniques;Therapeutic activities;Therapeutic exercise;Electrical Stimulation;Aquatic Therapy;DME Instruction;Functional mobility training;Gait training;Neuromuscular re-education;ADLs/Self Care Home Management;Cryotherapy;Moist Heat;Stair training;Orthotic Fit/Training;Energy conservation    PT Next Visit Plan LE stretching, balance, gait    PT Home Exercise Plan Long sitting hamstring stretch, prone quad stretch, figure 4/FABER stretch, standing/long sitting calf stretch; no updates    Consulted and Agree with Plan of Care Patient             Patient will benefit from skilled therapeutic intervention in order to improve the following deficits and impairments:  Abnormal gait, Decreased balance, Decreased endurance, Decreased mobility, Difficulty walking, Cardiopulmonary status limiting activity, Decreased activity tolerance, Decreased safety  awareness, Decreased strength  Visit Diagnosis: Other abnormalities of gait  and mobility  Unsteadiness on feet  Muscle weakness (generalized)  Other lack of coordination     Problem List Patient Active Problem List   Diagnosis Date Noted   Loss of weight 07/17/2015   Depression 07/17/2015   Cerebral palsy ([place]) 07/17/2015    Josehua Hammar, PT, DPT 11/05/2021, 2:55 PM  [place] MAIN [facility] SERVICES [address] [place], [place], [address] Phone: [phone]   Fax:  [phone]  Name: Renee Walton MRN: [id number] Date of Birth: Mar 13, 1996

## 2021-11-06 ENCOUNTER — Ambulatory Visit: Payer: Medicaid Other | Admitting: Physical Therapy

## 2021-11-08 ENCOUNTER — Ambulatory Visit: Payer: Medicaid Other | Admitting: Physical Therapy

## 2021-11-12 ENCOUNTER — Ambulatory Visit: Payer: Medicaid Other | Admitting: Physical Therapy

## 2021-11-13 ENCOUNTER — Ambulatory Visit: Payer: Medicaid Other | Admitting: Physical Therapy

## 2021-11-15 ENCOUNTER — Ambulatory Visit: Payer: Medicaid Other | Admitting: Physical Therapy

## 2021-11-19 ENCOUNTER — Other Ambulatory Visit: Payer: Self-pay

## 2021-11-19 ENCOUNTER — Ambulatory Visit: Payer: Medicaid Other | Admitting: Physical Therapy

## 2021-11-19 ENCOUNTER — Encounter: Payer: Self-pay | Admitting: Physical Therapy

## 2021-11-19 DIAGNOSIS — M6281 Muscle weakness (generalized): Secondary | ICD-10-CM

## 2021-11-19 DIAGNOSIS — R278 Other lack of coordination: Secondary | ICD-10-CM

## 2021-11-19 DIAGNOSIS — R2681 Unsteadiness on feet: Secondary | ICD-10-CM

## 2021-11-19 DIAGNOSIS — R2689 Other abnormalities of gait and mobility: Secondary | ICD-10-CM

## 2021-11-19 NOTE — Therapy (Signed)
[place] MAIN [facility] SERVICES [address] [place], [place], [address] Phone: [phone]   Fax:  [phone]  Physical Therapy Treatment  Patient Details  Name: Renee Walton MRN: [id number] Date of Birth: 1996-04-16 Referring Provider (PT): Perrin Maltese MD   Encounter Date: 11/19/2021   PT End of Session - 11/19/21 1149     Visit Number 24    Number of Visits 27    Date for PT Re-Evaluation 12/31/21    Authorization Type Medicaid (27 annual visit PT/OT)    Authorization Time Period authorized 12 visits, 1/30-4/23    Authorization - Visit Number 1    Authorization - Number of Visits 12    PT Start Time 3295    PT Stop Time 1230    PT Time Calculation (min) 42 min    Equipment Utilized During Treatment Gait belt    Activity Tolerance Patient tolerated treatment well;No increased pain    Behavior During Therapy WFL for tasks assessed/performed             Past Medical History:  Diagnosis Date   Breakthrough bleeding    Cerebral palsy (HCC)    GAD (generalized anxiety disorder)    Migraine headache     Past Surgical History:  Procedure Laterality Date   LEG SURGERY     bilateral x2 in 2008    There were no vitals filed for this visit.   Subjective Assessment - 11/19/21 1153     Subjective Patient reports going to [employer] 2x a week (Tues/Thurs). She reports walking short distances at home by herself on Friday, she reports she lost her balance and fell. She was able to get up on her own. On Saturday her LLE lower leg started hurting.    Patient is accompained by: Family member    Pertinent History Pt returns to therapy after previous bout of CP. Pt states her major goal with therapy is to improve her mobility as much as possible before returning to [place] this upcoming September. Pt states [place] is not as ADA compliant for her as the [place] and wants to improve her walking and balance to feel more  confident because she can not bring her walker on the airplane. Pt reports meeting with MD about possible foot alignment surgery on April 19th and her MD in [place] wants to perform a hip adductor release. 2 falls have occurred in last 6 months due to balance loss but no injury and able to stand indep with use of UE's to helpherself up. Pt does not specify which direction her LOB occurs. Pt uses gym 2x/week for stretching and leg exercises and is motivated to improve her mobility.    Limitations Standing;Walking    How long can you sit comfortably? Unlimited    How long can you stand comfortably? 2 hours    How long can you walk comfortably? 30 min    Patient Stated Goals Improve mobility so pt can safely walk in [place].    Currently in Pain? Yes    Pain Score 3     Pain Location Leg    Pain Orientation Left;Lower    Pain Descriptors / Indicators Sharp    Pain Type Acute pain    Pain Onset In the past 7 days    Pain Frequency Intermittent    Aggravating Factors  worse with movement    Pain Relieving Factors massage/stretch/rest    Effect of Pain on Daily Activities decreased  activity tolerance;    Multiple Pain Sites No                  TREATMENT: Patient required min A to transfer wheelchair to mat table   Patient hooklying: SAQ 2# x10 reps with 3 sec hold and cues for proper exercise technique;  Bridges with arms by side and legs apart to challenge gluteal control x15 reps Hip abduction/ER red tband x15 reps with AAROM  SLR hip flexion 2# x10 reps with mod VCS to keep knee extended; patient exhibits decreased motor control requiring min A for proper positioning and often bends knee with difficulty keeping leg extended; *all done 2 sets, patient able to exhibit better ROM with 2nd set but continues to have decreased motor control;      Patient prone: Alternate LE hamstring curl, 2# on LLE, no weight on RLE required AAROM on RLE x10 reps with decreased motor control and  increased adductor tone requiring min A to isolate single leg     Instructed patient in prone plank on forearm 15 sec hold x3 reps with min A for foot positioning; Attempted UE lift while in plank position- patient unable. She reports feeling moderate difficulty and full body exercise during plan exercise;    Following exercise, patient ambulated 20 feet without AD, required CGA with reciprocal gait pattern, uneven step length, narrow base of support with hip ER and increased lateral trunk lean during each step to compensate for poor gluteal control/activation.  Discussed possible AFO to assist with foot clearance;    She reports no pain at end of session. Educated patient on safe exercises to do at gym for better hip strengthening; Reinforced importance of hip strengthening for better gait mechanics;                               PT Education - 11/19/21 1149     Education provided Yes    Education Details exercise/positioning;    Person(s) Educated Patient    Methods Explanation;Verbal cues    Comprehension Verbalized understanding;Returned demonstration;Verbal cues required;Need further instruction              PT Short Term Goals - 10/11/21 1624       PT SHORT TERM GOAL #1   Title Pt will be indep with performing HEP 3x/week to improve LE tone and strength for improved safety with at home ambulation and ADL's.    Baseline 3/10: Given LE stretches, 6/16: going to gym 1x a week, doing stretches 1x a week; 9/15: 2x a week going to gym;    Time 4    Period Weeks    Status Not Met    Target Date 11/05/21               PT Long Term Goals - 10/11/21 1624       PT LONG TERM GOAL #1   Title Patient will increase Berg Balance score by > 6 points to demonstrate decreased fall risk during functional activities.    Baseline 12/28/20: 32/56 6/16:33/56, 9/15: 35/56, 12/19: deferred    Time 12    Period Weeks    Status Partially Met    Target Date 12/31/21       PT LONG TERM GOAL #2   Title Pt will reduce 5xsSTS time to < 8 sec to demonstrate clinically significant improvement in LE strength and balance to reduce risk of falls.  Baseline 3/10: 10.33 sec no UE support, 6/16: 6 sec with arms across chest (partial ROM), but requires 12 sec for full sit to stand; 9/15: 9.10 sec with arms across chest; 12/19: 11 sec with arms across chest    Time 12    Period Weeks    Status Partially Met    Target Date 12/31/21      PT LONG TERM GOAL #3   Title Pt will improve 6 MWT to > 1000' with no AD and no LOB to demonstrate ability to improve safety with community distances.    Baseline 3/10: 510', 6/16: 920 feet with CGA, 9/15: 850 feet, 12/19: deferred;    Time 12    Period Weeks    Status Partially Met    Target Date 12/31/21      PT LONG TERM GOAL #4   Title Pt will improve 10 m gait speed with no AD > 1.0 m/s to demonstrate reduced risk of falls along with safe ability to complete community walking tasks.    Baseline 3/10: 0.78 m/s, 6/16: 0.84 m/s, 9/15: 0.99 m/s, 12/19: 0.5 m/s    Time 12    Period Weeks    Status Not Met    Target Date 12/31/21      PT LONG TERM GOAL #5   Title Pt will improve FOTO score to 64 demonstrate perceived improvements in ability to complete ADL's.    Baseline 3/10: Risk adjusted 51; 04/19/21: 59, 9/15: 63%, 12/19: deferred;    Time 12    Period Weeks    Status On-going    Target Date 12/31/21                   Plan - 11/19/21 1254     Clinical Impression Statement Patient motivated and participated well within session. Focused on LE strengthening. Patient required min-mod VCs for proper positioning as well as tactile cues. She continues to have increased hip adductor tone and difficulty disassociating LE to isolate single leg exercise. She expressed concern about her feet dragging the floor. PT educated patient in importance of hip strengthening to facilitate better foot clearance. Also consider getting  AFOs to facilitate better foot clearance. Patient reports being open to new AFOs. She had AFOs several years ago and then stopped wearing them. She reports she got rid of them. Consider getting new referral for AFO fitting. Unsure how helpful AFOs will be as patient often ambulates with decreased hip flexion and increased lateral trunk lean compensating for weakness in gluteal muscles. Patient would benefit from additional skilled PT Intervention to improve strength, balance and gait safety;    Personal Factors and Comorbidities Comorbidity 2;Fitness;Past/Current Experience;Time since onset of injury/illness/exacerbation;Age    Comorbidities CP and anxiety;    Examination-Activity Limitations Bed Mobility    Examination-Participation Restrictions Church;Cleaning;Community Activity;Driving;Laundry;Medication Management;Meal Prep;Shop;Volunteer;Yard Work    Stability/Clinical Decision Making Unstable/Unpredictable    Rehab Potential Fair    PT Frequency 1x / week    PT Duration 12 weeks    PT Treatment/Interventions Patient/family education;Manual techniques;Therapeutic activities;Therapeutic exercise;Electrical Stimulation;Aquatic Therapy;DME Instruction;Functional mobility training;Gait training;Neuromuscular re-education;ADLs/Self Care Home Management;Cryotherapy;Moist Heat;Stair training;Orthotic Fit/Training;Energy conservation    PT Next Visit Plan LE stretching, balance, gait    PT Home Exercise Plan Long sitting hamstring stretch, prone quad stretch, figure 4/FABER stretch, standing/long sitting calf stretch; no updates    Consulted and Agree with Plan of Care Patient             Patient will benefit  from skilled therapeutic intervention in order to improve the following deficits and impairments:  Abnormal gait, Decreased balance, Decreased endurance, Decreased mobility, Difficulty walking, Cardiopulmonary status limiting activity, Decreased activity tolerance, Decreased safety awareness,  Decreased strength  Visit Diagnosis: Other abnormalities of gait and mobility  Unsteadiness on feet  Muscle weakness (generalized)  Other lack of coordination     Problem List Patient Active Problem List   Diagnosis Date Noted   Loss of weight 07/17/2015   Depression 07/17/2015   Cerebral palsy ([place]) 07/17/2015    Virgel Haro, PT, DPT 11/19/2021, 12:57 PM  [place] MAIN [facility] SERVICES [address] [place], [place], [address] Phone: [phone]   Fax:  [phone]  Name: Renee Walton MRN: [id number] Date of Birth: Oct 16, 1996

## 2021-11-22 ENCOUNTER — Ambulatory Visit: Payer: Medicaid Other | Admitting: Physical Therapy

## 2021-11-26 ENCOUNTER — Encounter: Payer: Self-pay | Admitting: Physical Therapy

## 2021-11-26 ENCOUNTER — Ambulatory Visit: Payer: Medicaid Other | Attending: Internal Medicine | Admitting: Physical Therapy

## 2021-11-26 ENCOUNTER — Other Ambulatory Visit: Payer: Self-pay

## 2021-11-26 DIAGNOSIS — G809 Cerebral palsy, unspecified: Secondary | ICD-10-CM | POA: Insufficient documentation

## 2021-11-26 DIAGNOSIS — R278 Other lack of coordination: Secondary | ICD-10-CM | POA: Insufficient documentation

## 2021-11-26 DIAGNOSIS — R2681 Unsteadiness on feet: Secondary | ICD-10-CM | POA: Diagnosis present

## 2021-11-26 DIAGNOSIS — R2689 Other abnormalities of gait and mobility: Secondary | ICD-10-CM | POA: Insufficient documentation

## 2021-11-26 DIAGNOSIS — M6281 Muscle weakness (generalized): Secondary | ICD-10-CM | POA: Diagnosis present

## 2021-11-26 NOTE — Therapy (Signed)
[place] MAIN [facility] SERVICES [address] [place], [place], [address] Phone: [phone]   Fax:  [phone]  Physical Therapy Treatment  Patient Details  Name: Renee Walton MRN: [id number] Date of Birth: 1996-02-25 Referring Provider (PT): Perrin Maltese MD   Encounter Date: 11/26/2021   PT End of Session - 11/26/21 1240     Visit Number 25    Number of Visits 27    Date for PT Re-Evaluation 12/31/21    Authorization Type Medicaid (27 annual visit PT/OT)    Authorization Time Period authorized 12 visits, 1/30-4/23    Authorization - Visit Number 1    Authorization - Number of Visits 12    PT Start Time 1132    PT Stop Time 1225    PT Time Calculation (min) 53 min    Equipment Utilized During Treatment Gait belt    Activity Tolerance Patient tolerated treatment well;No increased pain    Behavior During Therapy WFL for tasks assessed/performed             Past Medical History:  Diagnosis Date   Breakthrough bleeding    Cerebral palsy (HCC)    GAD (generalized anxiety disorder)    Migraine headache     Past Surgical History:  Procedure Laterality Date   LEG SURGERY     bilateral x2 in 2008    There were no vitals filed for this visit.   Subjective Assessment - 11/26/21 1238     Subjective Pateint states she is doing well today. She went to [employer] on Friday. She has been compliant with HEP; no questions or concerns at this time.    Patient is accompained by: Family member    Pertinent History Pt returns to therapy after previous bout of CP. Pt states her major goal with therapy is to improve her mobility as much as possible before returning to [place] this upcoming September. Pt states [place] is not as ADA compliant for her as the [place] and wants to improve her walking and balance to feel more confident because she can not bring her walker on the airplane. Pt reports meeting with MD about possible foot  alignment surgery on April 19th and her MD in [place] wants to perform a hip adductor release. 2 falls have occurred in last 6 months due to balance loss but no injury and able to stand indep with use of UE's to helpherself up. Pt does not specify which direction her LOB occurs. Pt uses gym 2x/week for stretching and leg exercises and is motivated to improve her mobility.    Limitations Standing;Walking    How long can you sit comfortably? Unlimited    How long can you stand comfortably? 2 hours    How long can you walk comfortably? 30 min    Patient Stated Goals Improve mobility so pt can safely walk in [place].    Currently in Pain? No/denies    Pain Onset In the past 7 days              TREATMENT: Leg press 25# BLE, ball between knees to prevent hip adduction/knee valgus, 2x15 reps;   Plank on forearm 15-20 sec hold x3 reps with min A for foot positioning, tactile cueing for improved core activation and equal hips; Attempted low plank on knees to enable UE lift - unable to attain plank on knees position.   Quadruped - static hold for 60 seconds. CGA at all times. Cueing for  anterior shift to align hips over knees and shoulders over wrists.   Quadruped with alternating UE lift - 2x10 each side. CGA at all times to steady/prevent excessive lateral shifting. Cueing for anterior shift to align hips over knees and shoulders over wrists.   Ardine Eng pose between quadruped exercises 3x30 seconds.  Standing in parallel bars: - alternating march with 2# AW 2 x 1 minute - hip extension with 2# AW, x30 seconds each side. Difficulty remaining within sagittal plane, compensation via circumduction.  - *attempted hamstring curl - pt unable.  - squats 2x15 reps with tactile cueing for depth. Progressed from 2-0 UE support.  - A-P step over orange hurdle 2x10 reps each side. Emphasis on hip flexion, knee flexion and DF. Assist provided at hip and knee for improved clearance. Very difficult. BUE  support.   Sitting on PB in // bars: - static sitting 3 x 1 minute. Cueing for no UE support and to correct LOB by shifting hips/body weight. CGA for safety.  - overhead press with unweighted ball, 3x15 reps. Mild difficulty maintaining stability at overhead position.   3 bouts of ambulation through gym, no AD with CGA to stabilize as pt exhibits forward lean and momentum during ambulation paired with short step length.    Pt initiated conversation regarding Botox injections stating she and her doctor are considering them. Pt asked questions about how these injections would effect/improve her gait mechanics and ambulatory abilities. PT provided brief overview/education on purpose of Botox and that gait will depend on which muscles are injected.   Clinical Impression: Pt demonstrates excellent motivation throughout therapy session; reports multiple times that she is ready to keep working and does not need a break - minimal rest breaks taken. Continued strengthening of core and hips was performed this date. Pt continues to exhibit global LE weakness, decreased coordination and tone leading to gait deviations. Patient would benefit from additional skilled PT intervention to improve strength, balance and gait safety.       PT Short Term Goals - 10/11/21 1624       PT SHORT TERM GOAL #1   Title Pt will be indep with performing HEP 3x/week to improve LE tone and strength for improved safety with at home ambulation and ADL's.    Baseline 3/10: Given LE stretches, 6/16: going to gym 1x a week, doing stretches 1x a week; 9/15: 2x a week going to gym;    Time 4    Period Weeks    Status Not Met    Target Date 11/05/21               PT Long Term Goals - 10/11/21 1624       PT LONG TERM GOAL #1   Title Patient will increase Berg Balance score by > 6 points to demonstrate decreased fall risk during functional activities.    Baseline 12/28/20: 32/56 6/16:33/56, 9/15: 35/56, 12/19: deferred     Time 12    Period Weeks    Status Partially Met    Target Date 12/31/21      PT LONG TERM GOAL #2   Title Pt will reduce 5xsSTS time to < 8 sec to demonstrate clinically significant improvement in LE strength and balance to reduce risk of falls.    Baseline 3/10: 10.33 sec no UE support, 6/16: 6 sec with arms across chest (partial ROM), but requires 12 sec for full sit to stand; 9/15: 9.10 sec with arms across chest; 12/19: 11 sec  with arms across chest    Time 12    Period Weeks    Status Partially Met    Target Date 12/31/21      PT LONG TERM GOAL #3   Title Pt will improve 6 MWT to > 1000' with no AD and no LOB to demonstrate ability to improve safety with community distances.    Baseline 3/10: 510', 6/16: 920 feet with CGA, 9/15: 850 feet, 12/19: deferred;    Time 12    Period Weeks    Status Partially Met    Target Date 12/31/21      PT LONG TERM GOAL #4   Title Pt will improve 10 m gait speed with no AD > 1.0 m/s to demonstrate reduced risk of falls along with safe ability to complete community walking tasks.    Baseline 3/10: 0.78 m/s, 6/16: 0.84 m/s, 9/15: 0.99 m/s, 12/19: 0.5 m/s    Time 12    Period Weeks    Status Not Met    Target Date 12/31/21      PT LONG TERM GOAL #5   Title Pt will improve FOTO score to 64 demonstrate perceived improvements in ability to complete ADL's.    Baseline 3/10: Risk adjusted 51; 04/19/21: 59, 9/15: 63%, 12/19: deferred;    Time 12    Period Weeks    Status On-going    Target Date 12/31/21                   Plan - 11/26/21 1319     Clinical Impression Statement Pt demonstrates excellent motivation throughout therapy session; reports multiple times that she is ready to keep working and does not need a break - minimal rest breaks taken. Continued strengthening of core and hips was performed this date. Pt continues to exhibit global LE weakness, decreased coordination and tone leading to gait deviations. Patient would benefit  from additional skilled PT intervention to improve strength, balance and gait safety.    Personal Factors and Comorbidities Comorbidity 2;Fitness;Past/Current Experience;Time since onset of injury/illness/exacerbation;Age    Comorbidities CP and anxiety;    Examination-Activity Limitations Bed Mobility    Examination-Participation Restrictions Church;Cleaning;Community Activity;Driving;Laundry;Medication Management;Meal Prep;Shop;Volunteer;Yard Work    Stability/Clinical Decision Making Unstable/Unpredictable    Rehab Potential Fair    PT Frequency 1x / week    PT Duration 12 weeks    PT Treatment/Interventions Patient/family education;Manual techniques;Therapeutic activities;Therapeutic exercise;Electrical Stimulation;Aquatic Therapy;DME Instruction;Functional mobility training;Gait training;Neuromuscular re-education;ADLs/Self Care Home Management;Cryotherapy;Moist Heat;Stair training;Orthotic Fit/Training;Energy conservation    PT Next Visit Plan LE stretching, balance, gait    PT Home Exercise Plan Long sitting hamstring stretch, prone quad stretch, figure 4/FABER stretch, standing/long sitting calf stretch; no updates    Consulted and Agree with Plan of Care Patient             Patient will benefit from skilled therapeutic intervention in order to improve the following deficits and impairments:  Abnormal gait, Decreased balance, Decreased endurance, Decreased mobility, Difficulty walking, Cardiopulmonary status limiting activity, Decreased activity tolerance, Decreased safety awareness, Decreased strength  Visit Diagnosis: Other abnormalities of gait and mobility  Unsteadiness on feet  Muscle weakness (generalized)  Other lack of coordination  Cerebral palsy, unspecified type [facility])     Problem List Patient Active Problem List   Diagnosis Date Noted   Loss of weight 07/17/2015   Depression 07/17/2015   Cerebral palsy ([place]) 07/17/2015    Patrina Levering PT,  DPT 11/26/21 1:24 PM [phone]   [facility]  [place] MA[facility] SERVICES [address] [place], [place], [address] Phone: [phone]   Fax:  [phone]  Name: Lunabella Badgett MRN: [id number] Date of Birth: [birth year]

## 2021-11-29 ENCOUNTER — Ambulatory Visit: Payer: Medicaid Other | Admitting: Physical Therapy

## 2021-12-03 ENCOUNTER — Ambulatory Visit: Payer: Medicaid Other | Admitting: Physical Therapy

## 2021-12-03 ENCOUNTER — Other Ambulatory Visit: Payer: Self-pay

## 2021-12-03 DIAGNOSIS — R2689 Other abnormalities of gait and mobility: Secondary | ICD-10-CM

## 2021-12-03 DIAGNOSIS — R278 Other lack of coordination: Secondary | ICD-10-CM

## 2021-12-03 DIAGNOSIS — M6281 Muscle weakness (generalized): Secondary | ICD-10-CM

## 2021-12-03 DIAGNOSIS — R2681 Unsteadiness on feet: Secondary | ICD-10-CM

## 2021-12-03 NOTE — Therapy (Signed)
[place] MAIN [facility] SERVICES [address] [place], [place], [address] Phone: [phone]   Fax:  [phone]  Physical Therapy Treatment  Patient Details  Name: Renee Walton MRN: [id number] Date of Birth: 1996/08/01 Referring Provider (PT): Perrin Maltese MD   Encounter Date: 12/03/2021   PT End of Session - 12/04/21 1432     Visit Number 26    Number of Visits 27    Date for PT Re-Evaluation 12/31/21    Authorization Type Medicaid (27 annual visit PT/OT)    Authorization Time Period authorized 12 visits, 1/30-4/23    Authorization - Visit Number 2    Authorization - Number of Visits 12    PT Start Time 1146    PT Stop Time 1230    PT Time Calculation (min) 44 min    Equipment Utilized During Treatment Gait belt    Activity Tolerance Patient tolerated treatment well;No increased pain    Behavior During Therapy WFL for tasks assessed/performed             Past Medical History:  Diagnosis Date   Breakthrough bleeding    Cerebral palsy (HCC)    GAD (generalized anxiety disorder)    Migraine headache     Past Surgical History:  Procedure Laterality Date   LEG SURGERY     bilateral x2 in 2008    There were no vitals filed for this visit.   Subjective Assessment - 12/04/21 1432     Subjective Patient reports anxiety attack this week and feels like she had a regression. She is still having difficulty walking at home.    Patient is accompained by: Family member    Pertinent History Pt returns to therapy after previous bout of CP. Pt states her major goal with therapy is to improve her mobility as much as possible before returning to [place] this upcoming September. Pt states [place] is not as ADA compliant for her as the [place] and wants to improve her walking and balance to feel more confident because she can not bring her walker on the airplane. Pt reports meeting with MD about possible foot alignment surgery on  April 19th and her MD in [place] wants to perform a hip adductor release. 2 falls have occurred in last 6 months due to balance loss but no injury and able to stand indep with use of UE's to helpherself up. Pt does not specify which direction her LOB occurs. Pt uses gym 2x/week for stretching and leg exercises and is motivated to improve her mobility.    Limitations Standing;Walking    How long can you sit comfortably? Unlimited    How long can you stand comfortably? 2 hours    How long can you walk comfortably? 30 min    Patient Stated Goals Improve mobility so pt can safely walk in [place].    Currently in Pain? No/denies    Pain Onset In the past 7 days                     TREATMENT: Patient hooklying: Windshield wiper trunk rotation x10 reps requiring AAROM to facilitate increased ROM;  PT performed passive hip adductor stretch with knee flex/extend x5 reps with therapist providing stabilization to contralateral side to facilitate better hip adductor stretch;  Patient continues to have increased tone which limits ROM;   PT performed toe stretches with extension of great toe and flexion of 2nd toe and then extension of 2nd  toe and flexion fo 3rd toe, etc with stretch between each digit; PT also performed passive toe flexion with splaying to reduce stiffness along metatarsal heads PT also performed gentle massage between metatarsals to reduce myofascial tightness Patient reports mild to moderate tenderness and reports less stiffness in feet following passive stretch;   Patient transitioned to prone:   Plank on forearm 15  sec hold x3 reps with min A for foot positioning, tactile cueing for improved core activation and equal hips;    Quadruped - static hold for 30 seconds. CGA at all times. Cueing for anterior shift to align hips over knees and shoulders over wrists. Also cues for core stabilization to flatten spine Attempted cat/camel but patient unable;    Quadruped with  alternating UE lift reaching for wall- x5 each side. CGA at all times to steady/prevent excessive lateral shifting. Cueing for anterior shift to align hips over knees and shoulders over wrists.   Patient transitioned to sitting;   Pt ambulated mat table to leg press with therapist, PT provided CGA with cues for increased step length and erect posture. Patient ambulates with increased lateral trunk lean due to poor gluteal activation.She was able to exhibit good foot clearance with minimal instability;    Leg press 25# single LE only with therapist providing min A for positioning to avoid knee valgus position x10 reps each LE;    She ambulated from leg press to mat table requiring CGA for safety exhibiting short shuffled steps and mild instability;   Caregiver reports patient does minimal activity at home, often sitting with legs crossed; PT recommended patient try walking in kitchen next to counter so that there is support nearby and work on increasing endurance with multiple laps;  PT also recommended patient rest in figure 4 position when sitting at home to reduce tightness to hip adductor and ER for better hip mobility. Patient verbalized understanding;     Patient tolerated session well. She does report mild fatigue at end of session. Reinforced HEP with instruction to increase activity level at home. Patient expressed frustration over not being able to walk at home but being able to walk in therapy. PT explained how patient may feel more comfortable at therapy as she has support person (therapist) to steady her. Recommend she find an area at home where she also feels comfortable such as next to the counter in the kitchen to work on walking;                         PT Education - 12/04/21 1432     Education provided Yes    Education Details exercise technique/positioning;    Person(s) Educated Patient    Methods Explanation;Verbal cues    Comprehension Verbalized  understanding;Returned demonstration;Verbal cues required;Need further instruction              PT Short Term Goals - 10/11/21 1624       PT SHORT TERM GOAL #1   Title Pt will be indep with performing HEP 3x/week to improve LE tone and strength for improved safety with at home ambulation and ADL's.    Baseline 3/10: Given LE stretches, 6/16: going to gym 1x a week, doing stretches 1x a week; 9/15: 2x a week going to gym;    Time 4    Period Weeks    Status Not Met    Target Date 11/05/21  PT Long Term Goals - 10/11/21 1624       PT LONG TERM GOAL #1   Title Patient will increase Berg Balance score by > 6 points to demonstrate decreased fall risk during functional activities.    Baseline 12/28/20: 32/56 6/16:33/56, 9/15: 35/56, 12/19: deferred    Time 12    Period Weeks    Status Partially Met    Target Date 12/31/21      PT LONG TERM GOAL #2   Title Pt will reduce 5xsSTS time to < 8 sec to demonstrate clinically significant improvement in LE strength and balance to reduce risk of falls.    Baseline 3/10: 10.33 sec no UE support, 6/16: 6 sec with arms across chest (partial ROM), but requires 12 sec for full sit to stand; 9/15: 9.10 sec with arms across chest; 12/19: 11 sec with arms across chest    Time 12    Period Weeks    Status Partially Met    Target Date 12/31/21      PT LONG TERM GOAL #3   Title Pt will improve 6 MWT to > 1000' with no AD and no LOB to demonstrate ability to improve safety with community distances.    Baseline 3/10: 510', 6/16: 920 feet with CGA, 9/15: 850 feet, 12/19: deferred;    Time 12    Period Weeks    Status Partially Met    Target Date 12/31/21      PT LONG TERM GOAL #4   Title Pt will improve 10 m gait speed with no AD > 1.0 m/s to demonstrate reduced risk of falls along with safe ability to complete community walking tasks.    Baseline 3/10: 0.78 m/s, 6/16: 0.84 m/s, 9/15: 0.99 m/s, 12/19: 0.5 m/s    Time 12     Period Weeks    Status Not Met    Target Date 12/31/21      PT LONG TERM GOAL #5   Title Pt will improve FOTO score to 64 demonstrate perceived improvements in ability to complete ADL's.    Baseline 3/10: Risk adjusted 51; 04/19/21: 59, 9/15: 63%, 12/19: deferred;    Time 12    Period Weeks    Status On-going    Target Date 12/31/21                   Plan - 12/04/21 1432     Clinical Impression Statement Patient motivated and participated well within session. She was instructed in advanced LE strengthening/core strengthening. She does require min A for positioning and min-mod VCs for correct exercise technique. Patient exhibits significant weakness in gluteal muscles. That combined with adductor tone makes walking and mobility difficult. PT instructed patient to work on walking at home. Patient's caregiver also reports she sits most of the day with her legs crossed. Recommend patient try resting in figure 4 position to provide increased stretch to hip adductor and ER for better hip flexibility. Patient verbalized understanding. She would benefit from additional skilled PT Intervention to improve strength and mobility;    Personal Factors and Comorbidities Comorbidity 2;Fitness;Past/Current Experience;Time since onset of injury/illness/exacerbation;Age    Comorbidities CP and anxiety;    Examination-Activity Limitations Bed Mobility    Examination-Participation Restrictions Church;Cleaning;Community Activity;Driving;Laundry;Medication Management;Meal Prep;Shop;Volunteer;Yard Work    Stability/Clinical Decision Making Unstable/Unpredictable    Rehab Potential Fair    PT Frequency 1x / week    PT Duration 12 weeks    PT Treatment/Interventions Patient/family education;Manual techniques;Therapeutic activities;Therapeutic  exercise;Electrical Stimulation;Aquatic Therapy;DME Instruction;Functional mobility training;Gait training;Neuromuscular re-education;ADLs/Self Care Home  Management;Cryotherapy;Moist Heat;Stair training;Orthotic Fit/Training;Energy conservation    PT Next Visit Plan LE stretching, balance, gait    PT Home Exercise Plan Long sitting hamstring stretch, prone quad stretch, figure 4/FABER stretch, standing/long sitting calf stretch; no updates    Consulted and Agree with Plan of Care Patient             Patient will benefit from skilled therapeutic intervention in order to improve the following deficits and impairments:  Abnormal gait, Decreased balance, Decreased endurance, Decreased mobility, Difficulty walking, Cardiopulmonary status limiting activity, Decreased activity tolerance, Decreased safety awareness, Decreased strength  Visit Diagnosis: Other abnormalities of gait and mobility  Unsteadiness on feet  Muscle weakness (generalized)  Other lack of coordination     Problem List Patient Active Problem List   Diagnosis Date Noted   Loss of weight 07/17/2015   Depression 07/17/2015   Cerebral palsy ([place]) 07/17/2015    Erina Hamme, PT, DPT 12/04/2021, 9:33 PM  [place] MAIN [facility] SERVICES [address] [place], [place], [address] Phone: [phone]   Fax:  [phone]  Name: Atianna Haidar MRN: [id number] Date of Birth: January 30, 1996

## 2021-12-04 ENCOUNTER — Encounter: Payer: Self-pay | Admitting: Physical Therapy

## 2021-12-06 ENCOUNTER — Ambulatory Visit: Payer: Medicaid Other | Admitting: Physical Therapy

## 2021-12-10 ENCOUNTER — Ambulatory Visit: Payer: Medicaid Other | Admitting: Physical Therapy

## 2021-12-13 ENCOUNTER — Ambulatory Visit: Payer: Medicaid Other | Admitting: Physical Therapy

## 2021-12-17 ENCOUNTER — Ambulatory Visit: Payer: Medicaid Other | Admitting: Physical Therapy

## 2021-12-17 ENCOUNTER — Other Ambulatory Visit: Payer: Self-pay

## 2021-12-17 DIAGNOSIS — R2689 Other abnormalities of gait and mobility: Secondary | ICD-10-CM | POA: Diagnosis not present

## 2021-12-17 DIAGNOSIS — M6281 Muscle weakness (generalized): Secondary | ICD-10-CM

## 2021-12-17 DIAGNOSIS — R2681 Unsteadiness on feet: Secondary | ICD-10-CM

## 2021-12-17 DIAGNOSIS — R278 Other lack of coordination: Secondary | ICD-10-CM

## 2021-12-17 NOTE — Therapy (Signed)
[place] MAIN [facility] SERVICES [address] [place], [place], [address] Phone: [phone]   Fax:  [phone]  Physical Therapy Treatment  Patient Details  Name: Renee Walton MRN: [id number] Date of Birth: 1996-02-01 Referring Provider (PT): Perrin Maltese MD   Encounter Date: 12/17/2021   PT End of Session - 12/17/21 1152     Visit Number 27    Number of Visits 27    Date for PT Re-Evaluation 12/31/21    Authorization Type Medicaid (27 annual visit PT/OT), visits for 2023: 6    Authorization Time Period authorized 12 visits, 1/30-4/23    Authorization - Visit Number 2    Authorization - Number of Visits 12    Equipment Utilized During Treatment Gait belt    Activity Tolerance Patient tolerated treatment well;No increased pain    Behavior During Therapy WFL for tasks assessed/performed             Past Medical History:  Diagnosis Date   Breakthrough bleeding    Cerebral palsy (HCC)    GAD (generalized anxiety disorder)    Migraine headache     Past Surgical History:  Procedure Laterality Date   LEG SURGERY     bilateral x2 in 2008    There were no vitals filed for this visit.   Subjective Assessment - 12/17/21 1353     Subjective Patient reports doing well. She is scheduled for BOTOX in May 2023 and is hopeful that will make a big difference with her walking.    Patient is accompained by: Family member    Pertinent History Pt returns to therapy after previous bout of CP. Pt states her major goal with therapy is to improve her mobility as much as possible before returning to [place] this upcoming September. Pt states [place] is not as ADA compliant for her as the [place] and wants to improve her walking and balance to feel more confident because she can not bring her walker on the airplane. Pt reports meeting with MD about possible foot alignment surgery on April 19th and her MD in [place] wants to perform a hip  adductor release. 2 falls have occurred in last 6 months due to balance loss but no injury and able to stand indep with use of UE's to helpherself up. Pt does not specify which direction her LOB occurs. Pt uses gym 2x/week for stretching and leg exercises and is motivated to improve her mobility.    Limitations Standing;Walking    How long can you sit comfortably? Unlimited    How long can you stand comfortably? 2 hours    How long can you walk comfortably? 30 min    Patient Stated Goals Improve mobility so pt can safely walk in [place].    Currently in Pain? No/denies    Pain Onset In the past 7 days                  TREATMENT: Patient hooklying: Windshield wiper trunk rotation x5 reps requiring AAROM to facilitate increased ROM;  Single knee to chest 20 sec hold x1 rep each LE Passive hip circles clockwise/counterclockwise x5 reps each LE PT crossing single leg over contralateral knee with IT band stretch x5 reps PROM; PT performed passive hip adductor stretch with knee flex/extend x5 reps with therapist providing stabilization to contralateral side to facilitate better hip adductor stretch;  Patient continues to have increased tone which limits ROM;    Hooklying: Green tband around  BLE: -hip abduction/ER AAROM x5 reps -Bridges with legs apart x20 reps with max VCs for proper positioning to increase gluteal activation;   Patient transitioned to prone:   Plank on forearm 30  sec hold x2 reps with min A for foot positioning, tactile cueing for improved core activation and equal hips;    Quadruped - static hold for 30 seconds x2 sets. CGA at all times. Cueing for anterior shift to align hips over knees and shoulders over wrists. Also cues for core stabilization to flatten spine    Quadruped with alternating UE lift reaching for wall- x5 each side. CGA at all times to steady/prevent excessive lateral shifting. Cueing for anterior shift to align hips over knees and shoulders over  wrists.   Tall kneeling: -alternate UE lift x10 reps with max VCs and tactile cues to increase hip extension -side/side reach with max VCs and tactile cues for gluteal activation/hip extension x10 reps with increased fatigue with increased repetition, often sitting back on heels;   Patient transitioned to sitting;    Pt ambulated around gym with therapist x30 feet x 2sets , PT provided CGA with cues for increased step length and erect posture. Patient ambulates with increased lateral trunk lean due to poor gluteal activation.She was able to exhibit good foot clearance with minimal instability;    Standing on firm surface with one leg on 6 in step  Unsupported, 30 sec hold x1 rep each LE, requiring mod A for balance and cues for increased hip extension/abduction for better gluteal strengthening;     Patient tolerated session well. She does report mild fatigue at end of session. Reinforced HEP with instruction to increase activity level at home. Decreased plan of care to 1x every other week to conserve visits as patient is scheduled for BOTOX injection in May 2023.                            PT Education - 12/17/21 1353     Education provided Yes    Education Details exercise technique/positioning;    Person(s) Educated Patient    Methods Explanation;Verbal cues    Comprehension Verbalized understanding;Returned demonstration;Verbal cues required;Need further instruction              PT Short Term Goals - 10/11/21 1624       PT SHORT TERM GOAL #1   Title Pt will be indep with performing HEP 3x/week to improve LE tone and strength for improved safety with at home ambulation and ADL's.    Baseline 3/10: Given LE stretches, 6/16: going to gym 1x a week, doing stretches 1x a week; 9/15: 2x a week going to gym;    Time 4    Period Weeks    Status Not Met    Target Date 11/05/21               PT Long Term Goals - 10/11/21 1624       PT LONG TERM GOAL #1    Title Patient will increase Berg Balance score by > 6 points to demonstrate decreased fall risk during functional activities.    Baseline 12/28/20: 32/56 6/16:33/56, 9/15: 35/56, 12/19: deferred    Time 12    Period Weeks    Status Partially Met    Target Date 12/31/21      PT LONG TERM GOAL #2   Title Pt will reduce 5xsSTS time to < 8 sec to demonstrate clinically  significant improvement in LE strength and balance to reduce risk of falls.    Baseline 3/10: 10.33 sec no UE support, 6/16: 6 sec with arms across chest (partial ROM), but requires 12 sec for full sit to stand; 9/15: 9.10 sec with arms across chest; 12/19: 11 sec with arms across chest    Time 12    Period Weeks    Status Partially Met    Target Date 12/31/21      PT LONG TERM GOAL #3   Title Pt will improve 6 MWT to > 1000' with no AD and no LOB to demonstrate ability to improve safety with community distances.    Baseline 3/10: 510', 6/16: 920 feet with CGA, 9/15: 850 feet, 12/19: deferred;    Time 12    Period Weeks    Status Partially Met    Target Date 12/31/21      PT LONG TERM GOAL #4   Title Pt will improve 10 m gait speed with no AD > 1.0 m/s to demonstrate reduced risk of falls along with safe ability to complete community walking tasks.    Baseline 3/10: 0.78 m/s, 6/16: 0.84 m/s, 9/15: 0.99 m/s, 12/19: 0.5 m/s    Time 12    Period Weeks    Status Not Met    Target Date 12/31/21      PT LONG TERM GOAL #5   Title Pt will improve FOTO score to 64 demonstrate perceived improvements in ability to complete ADL's.    Baseline 3/10: Risk adjusted 51; 04/19/21: 59, 9/15: 63%, 12/19: deferred;    Time 12    Period Weeks    Status On-going    Target Date 12/31/21                   Plan - 12/17/21 1354     Clinical Impression Statement Patient motivated and participated well within session. She was instructed in advanced ROM/stretch to facilitate better hip mobility. Instructed patient in advanced LE  strengthening focusing on gluteal activation for better hip stability. Patient has difficulty activating gluteal muscles, often compensating with other muscle groups. She required min A with qped/tall kneeling for better balance/positioning. She reports increased fatigue at end of session. Plan to decrease therapy to 1x every other week to conserve therapy visits for when she has BOTOX injection to allow for best benefit of treatment. Patient verbalized ageement;    Personal Factors and Comorbidities Comorbidity 2;Fitness;Past/Current Experience;Time since onset of injury/illness/exacerbation;Age    Comorbidities CP and anxiety;    Examination-Activity Limitations Bed Mobility    Examination-Participation Restrictions Church;Cleaning;Community Activity;Driving;Laundry;Medication Management;Meal Prep;Shop;Volunteer;Yard Work    Stability/Clinical Decision Making Unstable/Unpredictable    Rehab Potential Fair    PT Frequency 1x / week    PT Duration 12 weeks    PT Treatment/Interventions Patient/family education;Manual techniques;Therapeutic activities;Therapeutic exercise;Electrical Stimulation;Aquatic Therapy;DME Instruction;Functional mobility training;Gait training;Neuromuscular re-education;ADLs/Self Care Home Management;Cryotherapy;Moist Heat;Stair training;Orthotic Fit/Training;Energy conservation    PT Next Visit Plan LE stretching, balance, gait    PT Home Exercise Plan Long sitting hamstring stretch, prone quad stretch, figure 4/FABER stretch, standing/long sitting calf stretch; no updates    Consulted and Agree with Plan of Care Patient             Patient will benefit from skilled therapeutic intervention in order to improve the following deficits and impairments:  Abnormal gait, Decreased balance, Decreased endurance, Decreased mobility, Difficulty walking, Cardiopulmonary status limiting activity, Decreased activity tolerance, Decreased safety awareness, Decreased strength  Visit  Diagnosis: Other abnormalities of gait and mobility  Unsteadiness on feet  Muscle weakness (generalized)  Other lack of coordination     Problem List Patient Active Problem List   Diagnosis Date Noted   Loss of weight 07/17/2015   Depression 07/17/2015   Cerebral palsy ([place]) 07/17/2015    Jhan Conery, PT, DPT 12/17/2021, 1:56 PM  [place] MAIN [facility] SERVICES [address] [place], [place], [address] Phone: [phone]   Fax:  [phone]  Name: Verlin Duke MRN: [id number] Date of Birth: [birth year]

## 2021-12-20 ENCOUNTER — Ambulatory Visit: Payer: Medicaid Other | Admitting: Physical Therapy

## 2021-12-24 ENCOUNTER — Ambulatory Visit: Payer: Medicaid Other | Admitting: Physical Therapy

## 2021-12-31 ENCOUNTER — Ambulatory Visit: Payer: Medicaid Other | Admitting: Physical Therapy

## 2022-01-07 ENCOUNTER — Ambulatory Visit: Payer: Medicaid Other | Admitting: Physical Therapy

## 2022-01-09 ENCOUNTER — Other Ambulatory Visit: Payer: Self-pay

## 2022-01-09 ENCOUNTER — Encounter: Payer: Self-pay | Admitting: Obstetrics and Gynecology

## 2022-01-09 ENCOUNTER — Ambulatory Visit (INDEPENDENT_AMBULATORY_CARE_PROVIDER_SITE_OTHER): Payer: Medicaid Other | Admitting: Obstetrics and Gynecology

## 2022-01-09 VITALS — BP 130/90 | Ht 62.0 in | Wt 92.0 lb

## 2022-01-09 DIAGNOSIS — N921 Excessive and frequent menstruation with irregular cycle: Secondary | ICD-10-CM | POA: Diagnosis not present

## 2022-01-09 DIAGNOSIS — N898 Other specified noninflammatory disorders of vagina: Secondary | ICD-10-CM | POA: Diagnosis not present

## 2022-01-09 LAB — POCT WET PREP WITH KOH
Clue Cells Wet Prep HPF POC: NEGATIVE
KOH Prep POC: NEGATIVE
Trichomonas, UA: NEGATIVE
Yeast Wet Prep HPF POC: NEGATIVE

## 2022-01-09 NOTE — Progress Notes (Signed)
? ? ?Renee Loveless, MD ? ? ?Chief Complaint  ?Patient presents with  ? Menstrual Problem  ?  When pt has an anxiety attack she gets her period in which the bleeding stops after 3 days  ? ? ?HPI: ?     [name] is a 26 y.o. No obstetric history on file. whose LMP was No LMP recorded. (Menstrual status: Oral contraceptives)., presents today for BTB with OCPs when has increased stress/panic attacks for past 2 months. Has period-flow bleeding for 2-3 days and then sx resolve. Is on anxiety medication. Pt started on Lo Loestrin OCPs 11/19 for cycle control/BTB. Did other OCPs in past with n/v/headache. Menses are monthly on Lo Lo, last 5 days, light to mod flow, no BTB, minimal dysmen, improved with NSAIDs. No n/v/headaches. Really likes them. Doing well. Wants to cont.  ? ?Pt also with vaginal itching/vag d/c, no odor daily except when has period. Uses scented soap, dryer sheets and scented detergent. Wearing cotton underwear. No meds to treat.  ? ?Never had pap smear due to inability to tolerate exam.  ? ?Patient Active Problem List  ? Diagnosis Date Noted  ? Loss of weight 07/17/2015  ? Depression 07/17/2015  ? Cerebral palsy (HCC) 07/17/2015  ? ? ?Past Surgical History:  ?Procedure Laterality Date  ? LEG SURGERY    ? bilateral x2 in 2008  ? ? ?Family History  ?Problem Relation Age of Onset  ? Stroke Other   ?     grandparent  ? Hypertension Other   ?     parent  ? Mental illness Other   ?     parent  ? Diabetes Other   ?     parent, grandparent  ? ? ?Social History  ? ?Socioeconomic History  ? Marital status: Single  ?  Spouse name: Not on file  ? Number of children: Not on file  ? Years of education: Not on file  ? Highest education level: Not on file  ?Occupational History  ? Not on file  ?Tobacco Use  ? Smoking status: Never  ? Smokeless tobacco: Never  ?Vaping Use  ? Vaping Use: Never used  ?Substance and Sexual Activity  ? Alcohol use: No  ?  Alcohol/week: 0.0 standard drinks  ? Drug use: No  ? Sexual  activity: Never  ?Other Topics Concern  ? Not on file  ?Social History Narrative  ? ** Merged History Encounter **  ?    ? ?Social Determinants of Health  ? ?Financial Resource Strain: Not on file  ?Food Insecurity: Not on file  ?Transportation Needs: Not on file  ?Physical Activity: Not on file  ?Stress: Not on file  ?Social Connections: Not on file  ?Intimate Partner Violence: Not on file  ? ? ?Outpatient Medications Prior to Visit  ?Medication Sig Dispense Refill  ? baclofen (LIORESAL) 10 MG tablet Take 10 mg by mouth 3 (three) times daily.    ? escitalopram (LEXAPRO) 5 MG tablet Take 5 mg by mouth daily.    ? meloxicam (MOBIC) 15 MG tablet Take 15 mg by mouth daily as needed.    ? Multiple Vitamins-Minerals (CENTRUM SILVER ULTRA WOMENS PO) Take by mouth.    ? Norethindrone-Ethinyl Estradiol-Fe Biphas (LO LOESTRIN FE) 1 MG-10 MCG / 10 MCG tablet Take 1 tablet by mouth daily. 84 tablet 3  ? omeprazole (PRILOSEC) 40 MG capsule Take 40 mg by mouth daily.    ? ondansetron (ZOFRAN) 4 MG tablet Take  4 mg by mouth.    ? senna-docusate (SENOKOT-S) 8.6-50 MG tablet Take by mouth.    ? SUMAtriptan (IMITREX) 50 MG tablet Take by mouth as directed.    ? Vitamin D, Ergocalciferol, (DRISDOL) 50000 units CAPS capsule Take 50,000 Units by mouth every 7 (seven) days.    ? baclofen (LIORESAL) 10 MG tablet Take 10 mg by mouth.    ? ciclopirox (LOPROX) 0.77 % cream Apply to both feet all over and in between toes for 1 month then weekly for prevention 90 g 2  ? Clindamycin Phos-Benzoyl Perox gel Apply to affected areas on  face and back once daily in Morning. 50 g 1  ? Clindamycin-Benzoyl Per, Refr, (DUAC) gel Apply 1 application topically daily. Apply to the affected areas on face and back once daily in the AM 45 g 1  ? fluocinolone (SYNALAR) 0.01 % external solution Start Fluocinolone solution to scalp as needed. Avoid face, groin and underarms 60 mL 1  ? ketoconazole (NIZORAL) 2 % shampoo Massage in to scalp and leave in for 5 to  10 mins. Then rinse and use condition of choice 120 mL 1  ? mometasone (ELOCON) 0.1 % lotion Apply topically daily. 60 mL 0  ? ?No facility-administered medications prior to visit.  ? ? ? ? ?ROS: ? ?Review of Systems  ?Constitutional:  Negative for fever.  ?Gastrointestinal:  Negative for blood in stool, constipation, diarrhea, nausea and vomiting.  ?Genitourinary:  Positive for menstrual problem and vaginal discharge. Negative for dyspareunia, dysuria, flank pain, frequency, hematuria, urgency, vaginal bleeding and vaginal pain.  ?Musculoskeletal:  Negative for back pain.  ?Skin:  Negative for rash.  ?BREAST: No symptoms ? ? ?OBJECTIVE:  ? ?Vitals:  ?BP 130/90   Ht 5\' 2"  (1.575 m)   Wt 92 lb (41.7 kg)   BMI 16.83 kg/m?  ? ?Physical Exam ?Genitourinary: ?   General: Normal vulva.  ?   Labia:     ?   Right: No rash, tenderness or lesion.     ?   Left: No rash, tenderness or lesion.   ?   Comments: QTIP BARELY INSERTED INTO VAGINA TO COLLECT VAG D/C SAMPLE; CAN'T TOLERATE SPECULUM/BIMANUAL EXAM. ? ? ?Results: ?Results for orders placed or performed in visit on 01/09/22 (from the past 24 hour(s))  ?POCT Wet Prep with KOH     Status: Normal  ? Collection Time: 01/09/22  4:06 PM  ?Result Value Ref Range  ? Trichomonas, UA Negative   ? Clue Cells Wet Prep HPF POC neg   ? Epithelial Wet Prep HPF POC    ? Yeast Wet Prep HPF POC neg   ? Bacteria Wet Prep HPF POC    ? RBC Wet Prep HPF POC    ? WBC Wet Prep HPF POC    ? KOH Prep POC Negative Negative  ? ? ? ?Assessment/Plan: ?Breakthrough bleeding on OCPs--sx with increased anxiety. Reassurance. Offered to change OCPs to Lomedia 24 (increase estrogen dose) but pt wants to stay on currently pills. F/u prn.  ? ?Vaginal itching - Plan: POCT Wet Prep with KOH;neg wet prep, neg exam. Question chem derm. Dove sens skin soap, line dry underwear, unscented detergent. F/u prn.   ? ? ? Return if symptoms worsen or fail to improve. ? ?Wilmon Conover B. Torion Hulgan, PA-C ?01/09/2022 ?4:08  PM ? ? ? ? ? ?

## 2022-01-14 ENCOUNTER — Ambulatory Visit: Payer: Medicaid Other | Admitting: Physical Therapy

## 2022-01-21 ENCOUNTER — Ambulatory Visit: Payer: Medicaid Other | Admitting: Physical Therapy

## 2022-01-28 ENCOUNTER — Ambulatory Visit: Payer: Medicaid Other | Admitting: Physical Therapy

## 2022-02-04 ENCOUNTER — Ambulatory Visit: Payer: Medicaid Other | Admitting: Physical Therapy

## 2022-02-11 ENCOUNTER — Ambulatory Visit: Payer: Medicaid Other | Attending: Internal Medicine | Admitting: Physical Therapy

## 2022-02-11 ENCOUNTER — Encounter: Payer: Self-pay | Admitting: Physical Therapy

## 2022-02-11 DIAGNOSIS — M6281 Muscle weakness (generalized): Secondary | ICD-10-CM | POA: Diagnosis present

## 2022-02-11 DIAGNOSIS — R278 Other lack of coordination: Secondary | ICD-10-CM | POA: Insufficient documentation

## 2022-02-11 DIAGNOSIS — R2689 Other abnormalities of gait and mobility: Secondary | ICD-10-CM | POA: Insufficient documentation

## 2022-02-11 DIAGNOSIS — R2681 Unsteadiness on feet: Secondary | ICD-10-CM | POA: Insufficient documentation

## 2022-02-11 NOTE — Therapy (Signed)
[facility] ?[place], [place], [address] ?Phone: [phone]   Fax:  [phone] ? ?Physical Therapy Treatment ? ?Patient Details  ?Name: Renee Walton ?MRN: [id number] ?Date of Birth: 1996/06/23 ?No data recorded ? ?Encounter Date: 02/11/2022 ? ? PT End of Session - 02/11/22 1151   ? ? Visit Number 28   ? Number of Visits 39   ? Date for PT Re-Evaluation 05/06/22   ? Authorization Type Medicaid (27 annual visit PT/OT), visits for 2023: 6   ? Authorization Time Period authorized 12 visits, 1/30-4/23   ? Authorization - Visit Number 5   ? Authorization - Number of Visits 12   ? PT Start Time 1146   ? PT Stop Time 1230   ? PT Time Calculation (min) 44 min   ? Equipment Utilized During Treatment Gait belt   ? Activity Tolerance Patient tolerated treatment well;No increased pain   ? Behavior During Therapy [facility] for tasks assessed/performed   ? ?  ?  ? ?  ? ? ?Past Medical History:  ?Diagnosis Date  ? Breakthrough bleeding   ? Cerebral palsy ([place])   ? GAD (generalized anxiety disorder)   ? Migraine headache   ? ? ?Past Surgical History:  ?Procedure Laterality Date  ? LEG SURGERY    ? bilateral x2 in 2008  ? ? ?There were no vitals filed for this visit. ? ? Subjective Assessment - 02/11/22 1148   ? ? Subjective Patient reports doing okay. She is able to walk by herself on the carpet but will use a walker when on tile. She reports in the morning she will feel more pain and dizziness; She no longer has home health aide and is only getting support from her mom. She reports her pain is mostly in LLE but occasionally in both legs, "needle pain". Denies any recent falls; She is still planning on getting BOTOX in May 2023   ? Patient is accompained by: Family member   ? Pertinent History Pt returns to therapy after previous bout of CP. Pt states her major goal with therapy is to improve her mobility as much as possible before returning to [place] this upcoming  September. Pt states [place] is not as ADA compliant for her as the [place] and wants to improve her walking and balance to feel more confident because she can not bring her walker on the airplane. Pt reports meeting with MD about possible foot alignment surgery on April 19th and her MD in [place] wants to perform a hip adductor release. 2 falls have occurred in last 6 months due to balance loss but no injury and able to stand indep with use of UE's to helpherself up. Pt does not specify which direction her LOB occurs. Pt uses gym 2x/week for stretching and leg exercises and is motivated to improve her mobility.   ? Limitations Standing;Walking   ? How long can you sit comfortably? Unlimited   ? How long can you stand comfortably? 2 hours   ? How long can you walk comfortably? 30 min   ? Patient Stated Goals Improve mobility so pt can safely walk in [place].   ? Currently in Pain? Yes   ? Pain Score 2    ? Pain Location Leg   ? Pain Orientation Left   ? Pain Descriptors / Indicators Sharp;Pins and needles   ? Pain Type Acute pain   ? Pain Onset In the past 7  days   ? Pain Frequency Intermittent   ? Aggravating Factors  worse in the morning; not present in the afternoon   ? Pain Relieving Factors movement/stretch   ? Effect of Pain on Daily Activities decreased activity tolerance in the morning;   ? Multiple Pain Sites No   ? ?  ?  ? ?  ? ? ? ? ? [facility] PT Assessment - 02/11/22 0001   ? ?  ? Observation/Other Assessments  ? Focus on Therapeutic Outcomes (FOTO)  58%, more impaired than 9/15 which was 63%   ?  ? 6 minute walk test results   ? Aerobic Endurance Distance Walked 950   ? Endurance additional comments with CGA to min A, improved from 07/05/21 which was 850 feet;   ?  ? Standardized Balance Assessment  ? Five times sit to stand comments  9.92 sec with arms across chest, improved from 11.5 sec on 10/08/21   ?  ? Berg Balance Test  ? Sit to Stand Able to stand without using hands and stabilize  independently   ? Standing Unsupported Able to stand 2 minutes with supervision   ? Sitting with Back Unsupported but Feet Supported on Floor or Stool Able to sit safely and securely 2 minutes   ? Stand to Sit Sits safely with minimal use of hands   ? Transfers Able to transfer safely, definite need of hands   ? Standing Unsupported with Eyes Closed Able to stand 10 seconds safely   ? Standing Unsupported with Feet Together Needs help to attain position but able to stand for 30 seconds with feet together   ? From Standing, Reach Forward with Outstretched Arm Can reach forward >12 cm safely (5")   ? From Standing Position, Pick up Object from Floor Able to pick up shoe, needs supervision   ? From Standing Position, Turn to Look Behind Over each Shoulder Looks behind one side only/other side shows less weight shift   ? Turn 360 Degrees Needs assistance while turning   ? Standing Unsupported, Alternately Place Feet on Step/Stool Needs assistance to keep from falling or unable to try   ? Standing Unsupported, One Foot in Front Needs help to step but can hold 15 seconds   ? Standing on One Leg Unable to try or needs assist to prevent fall   ? Total Score 33   ? Berg comment: 100% risk for falls, more impaired than 07/05/21 which was 35/56   ? ?  ?  ? ?  ? ? ? ?TREATMENT: ?Patient required min A to transition to hooklying position: ? ?Patient hooklying: ?Single knee to chest 20 sec hold x1 rep each LE ?Passive hip circles clockwise/counterclockwise x5 reps each LE ?PT crossing single leg over contralateral knee with IT band stretch x5 reps PROM; ?PT performed passive SLR hamstring stretch with hip adductor stretch x60 sec hold x2 reps with therapist providing stabilization to contralateral side to facilitate better hip adductor stretch;  ?Patient continues to have increased tone which limits ROM;  ?  ?Hooklying: ?Green tband around BLE: ?-hip abduction/ER AAROM x15 reps ?Constance Haw with legs apart x15 reps with max VCs for  proper positioning to increase gluteal activation; ? ? ?Instructed patient in outcome measures to address progress towards goals; See above; ? ? ? ? ? ? ? ? ? ? ? ? ? ? ? ? ? ? ? ? ? ? PT Short Term Goals - 02/11/22 1151   ? ?  ?  PT SHORT TERM GOAL #1  ? Title Pt will be indep with performing HEP 3x/week to improve LE tone and strength for improved safety with at home ambulation and ADL's.   ? Baseline 3/10: Given LE stretches, 6/16: going to gym 1x a week, doing stretches 1x a week; 9/15: 2x a week going to gym; 4/24: getting massage/stretching 2x a week; hasn't been going to gym during ramadan.   ? Time 4   ? Period Weeks   ? Status Not Met   ? Target Date 11/05/21   ? ?  ?  ? ?  ? ? ? ? PT Long Term Goals - 02/11/22 1153   ? ?  ? PT LONG TERM GOAL #1  ? Title Patient will increase Berg Balance score by > 6 points to demonstrate decreased fall risk during functional activities.   ? Baseline 12/28/20: 32/56 6/16:33/56, 9/15: 35/56, 12/19: deferred, 4/24: 33/56   ? Time 12   ? Period Weeks   ? Status Partially Met   ? Target Date 05/06/22   ?  ? PT LONG TERM GOAL #2  ? Title Pt will reduce 5xsSTS time to < 8 sec to demonstrate clinically significant improvement in LE strength and balance to reduce risk of falls.   ? Baseline 3/10: 10.33 sec no UE support, 6/16: 6 sec with arms across chest (partial ROM), but requires 12 sec for full sit to stand; 9/15: 9.10 sec with arms across chest; 12/19: 11 sec with arms across chest, 4/24: 9.92 sec with arms across chest   ? Time 12   ? Period Weeks   ? Status Partially Met   ? Target Date 05/06/22   ?  ? PT LONG TERM GOAL #3  ? Title Pt will improve 6 MWT to > 1000' with no AD and no LOB to demonstrate ability to improve safety with community distances.   ? Baseline 3/10: 510', 6/16: 920 feet with CGA, 9/15: 850 feet, 12/19: deferred; 4/24: 950 feet with min A   ? Time 12   ? Period Weeks   ? Status Partially Met   ? Target Date 05/06/22   ?  ? PT LONG TERM GOAL #4  ? Title Pt  will improve 10 m gait speed with no AD > 1.0 m/s to demonstrate reduced risk of falls along with safe ability to complete community walking tasks.   ? Baseline 3/10: 0.78 m/s, 6/16: 0.84 m/s, 9/15: 0.99 m/s, 12

## 2022-02-18 ENCOUNTER — Ambulatory Visit: Payer: Medicaid Other | Admitting: Physical Therapy

## 2022-02-25 ENCOUNTER — Ambulatory Visit: Payer: Medicaid Other | Attending: Internal Medicine | Admitting: Physical Therapy

## 2022-02-25 ENCOUNTER — Encounter: Payer: Self-pay | Admitting: Physical Therapy

## 2022-02-25 DIAGNOSIS — R2689 Other abnormalities of gait and mobility: Secondary | ICD-10-CM | POA: Diagnosis present

## 2022-02-25 DIAGNOSIS — R2681 Unsteadiness on feet: Secondary | ICD-10-CM | POA: Insufficient documentation

## 2022-02-25 DIAGNOSIS — R278 Other lack of coordination: Secondary | ICD-10-CM | POA: Insufficient documentation

## 2022-02-25 DIAGNOSIS — M6281 Muscle weakness (generalized): Secondary | ICD-10-CM | POA: Diagnosis present

## 2022-02-25 NOTE — Therapy (Signed)
[facility] ?[place], [place], [address] ?Phone: [phone]   Fax:  [phone] ? ?Physical Therapy Treatment ? ?Patient Details  ?Name: Renee Walton ?MRN: [id number] ?Date of Birth: 1996/09/15 ?No data recorded ? ?Encounter Date: 02/25/2022 ? ? PT End of Session - 02/25/22 1259   ? ? Visit Number 29   ? Number of Visits 39   ? Date for PT Re-Evaluation 05/06/22   ? Authorization Type Medicaid (27 annual visit PT/OT), visits for 2023: 6   ? Authorization Time Period authorized 12 visits, 1/30-4/23   ? Authorization - Visit Number 5   ? Authorization - Number of Visits 12   ? PT Start Time 1148   ? PT Stop Time 1230   ? PT Time Calculation (min) 42 min   ? Equipment Utilized During Treatment Gait belt   ? Activity Tolerance Patient tolerated treatment well;No increased pain   ? Behavior During Therapy [facility] for tasks assessed/performed   ? ?  ?  ? ?  ? ? ?Past Medical History:  ?Diagnosis Date  ? Breakthrough bleeding   ? Cerebral palsy ([place])   ? GAD (generalized anxiety disorder)   ? Migraine headache   ? ? ?Past Surgical History:  ?Procedure Laterality Date  ? LEG SURGERY    ? bilateral x2 in 2008  ? ? ?There were no vitals filed for this visit. ? ? Subjective Assessment - 02/25/22 1245   ? ? Subjective Patient reports having anxiety and as a result has been experiencing increased weakness. Her mom reports she has not been active at home and spends most of her time sitting on the sofa watching TV.   ? Patient is accompained by: Family member   ? Pertinent History Pt returns to therapy after previous bout of CP. Pt states her major goal with therapy is to improve her mobility as much as possible before returning to [place] this upcoming September. Pt states [place] is not as ADA compliant for her as the [place] and wants to improve her walking and balance to feel more confident because she [place] not [place] walker on the airplane. Pt reports  meeting with MD about possible foot alignment surgery on April 19th and her MD in [place] wants to perform a hip adductor release. 2 falls have occurred in last 6 months due to balance loss but no injury and able to stand indep with use of UE's to helpherself up. Pt does not specify which direction her LOB occurs. Pt uses gym 2x/week for stretching and leg exercises and is motivated to improve her mobility.   ? Limitations Standing;Walking   ? How long can you sit comfortably? Unlimited   ? How long can you stand comfortably? 2 hours   ? How long can you walk comfortably? 30 min   ? Patient Stated Goals Improve mobility so pt can safely walk in [place].   ? Currently in Pain? No/denies   ? Pain Onset In the past 7 days   ? ?  ?  ? ?  ? ? ? ? ?TREATMENT: ?Patient/caregiver report patient has not been doing HEP at home and spends most of her time sitting on sofa watching TV. She reports having increased anxiety and as a result experiences increased weakness and tightness in LE and doesn't feel like she can do much activity; ? ?PT instructed patient in seated exercise to challenge trunk control, sitting balance and LE strength in  effort to increase adherence as this is the position patient is in most of the time: ? ?Exercises ?- Seated Anterior Pelvic Tilt  - x2 min with cues for erect posture; educated patient to avoid sitting against back of sofa but to sit forward to challenge trunk control; reports moderate difficulty; patient often sacral sits with flexed posture; ?- Seated Trunk Rotation  - x10 sec hold x2 reps each direction with cues for positioning; ?- Seated Lumbar Flexion Stretch  - 5 sec hold x5 reps with cues to reach between legs to challenge hip adductor tightness and facilitate better ROM;  ?- Seated Hip Flexion March with Ankle Weights  -no weights, x10 reps with cues for erect posture; patient exhibits decreased ROM on RLE compared to LLE due to weakness; She also required cues to avoid lateral  trunk lean to isolate hip flexor activation and increase core stabilization;  ?- Seated Hip Abduction with Resistance  - red tband x10 reps with visual cues to increase ROM;  ?- Seated Long Arc Quad  - no resistance x10 reps each LE, required AAROM on RLE due to increased tone/weakness; patient also exhibits increased tethering having difficulty isolating RLE ? ?- Walking with Counter Support  - recommend patient walk around kitchen with counter top support as needed at least 1x every hour for 5 min (during commercials) ? ?Patient also encouraged to write down 5 tasks that she can do around the house to help her mom and increase her independence. Recommended patient set an alarm on her phone/iwatch to alert her so she doesn't forget; ? ?Patient educated how increasing activity can help reduce anxiety and will help prepare her LE for better outcomes following Botox injection;  ? ? ? ? ? ? ? ? ? ? ? ? ? ? ? ? ? ? ? ? ? ? ? PT Education - 02/25/22 1259   ? ? Education provided Yes   ? Education Details exercise position/HEP   ? Person(s) Educated Patient   ? Methods Explanation;Verbal cues;Handout   ? Comprehension Verbalized understanding;Returned demonstration;Verbal cues required;Need further instruction   ? ?  ?  ? ?  ? ? ? PT Short Term Goals - 02/11/22 1151   ? ?  ? PT SHORT TERM GOAL #1  ? Title Pt will be indep with performing HEP 3x/week to improve LE tone and strength for improved safety with at home ambulation and ADL's.   ? Baseline 3/10: Given LE stretches, 6/16: going to gym 1x a week, doing stretches 1x a week; 9/15: 2x a week going to gym; 4/24: getting massage/stretching 2x a week; hasn't been going to gym during ramadan.   ? Time 4   ? Period Weeks   ? Status Not Met   ? Target Date 11/05/21   ? ?  ?  ? ?  ? ? ? ? PT Long Term Goals - 02/11/22 1153   ? ?  ? PT LONG TERM GOAL #1  ? Title Patient will increase Berg Balance score by > 6 points to demonstrate decreased fall risk during functional  activities.   ? Baseline 12/28/20: 32/56 6/16:33/56, 9/15: 35/56, 12/19: deferred, 4/24: 33/56   ? Time 12   ? Period Weeks   ? Status Partially Met   ? Target Date 05/06/22   ?  ? PT LONG TERM GOAL #2  ? Title Pt will reduce 5xsSTS time to < 8 sec to demonstrate clinically significant improvement in LE strength and balance to  reduce risk of falls.   ? Baseline 3/10: 10.33 sec no UE support, 6/16: 6 sec with arms across chest (partial ROM), but requires 12 sec for full sit to stand; 9/15: 9.10 sec with arms across chest; 12/19: 11 sec with arms across chest, 4/24: 9.92 sec with arms across chest   ? Time 12   ? Period Weeks   ? Status Partially Met   ? Target Date 05/06/22   ?  ? PT LONG TERM GOAL #3  ? Title Pt will improve 6 MWT to > 1000' with no AD and no LOB to demonstrate ability to improve safety with community distances.   ? Baseline 3/10: 510', 6/16: 920 feet with CGA, 9/15: 850 feet, 12/19: deferred; 4/24: 950 feet with min A   ? Time 12   ? Period Weeks   ? Status Partially Met   ? Target Date 05/06/22   ?  ? PT LONG TERM GOAL #4  ? Title Pt will improve 10 m gait speed with no AD > 1.0 m/s to demonstrate reduced risk of falls along with safe ability to complete community walking tasks.   ? Baseline 3/10: 0.78 m/s, 6/16: 0.84 m/s, 9/15: 0.99 m/s, 12/19: 0.5 m/s, 4/24: deferred;   ? Time 12   ? Period Weeks   ? Status Not Met   ? Target Date 05/06/22   ?  ? PT LONG TERM GOAL #5  ? Title Pt will improve FOTO score to 64 demonstrate perceived improvements in ability to complete ADL's.   ? Baseline 3/10: Risk adjusted 51; 04/19/21: 59, 9/15: 63%, 12/19: deferred; 4/24: 58%   ? Time 12   ? Period Weeks   ? Status On-going   ? Target Date 05/06/22   ? ?  ?  ? ?  ? ? ? ? ? ? ? ? Plan - 02/25/22 1259   ? ? Clinical Impression Statement Patient motivated and participated well within session. She expressed concern over weakness and immobility after experiencing anxiety. PT encouraged patient to increase activity by  doing exercises that fit in with her schedule. She was educated in seated exercise for better adherence with HEP. Patient does require cues for erect posture and to improve trunk extension. She often slumps and s

## 2022-02-25 NOTE — Patient Instructions (Signed)
Access Code: [email or web address] ?URL: [email or web address] ?Date: 02/25/2022 ?Prepared by: Zettie Pho ? ?Exercises ?- Seated Anterior Pelvic Tilt  - 1 x daily - 7 x weekly - 3 sets - 10 reps ?- Seated Trunk Rotation  - 1 x daily - 7 x weekly - 1 sets - 5 reps ?- Seated Lumbar Flexion Stretch  - 1 x daily - 7 x weekly - 1 sets - 5 reps - 5 sec hold ?- Seated Hip Flexion March with Ankle Weights  - 1 x daily - 7 x weekly - 1 sets - 10 reps ?- Seated Hip Abduction with Resistance  - 1 x daily - 7 x weekly - 3 sets - 10 reps ?- Seated Long Arc Quad  - 1 x daily - 7 x weekly - 1 sets - 10 reps ?- Walking with Counter Support  - 5 x daily - 7 x weekly - 1 sets - 5 reps ?

## 2022-03-04 ENCOUNTER — Ambulatory Visit: Payer: Medicaid Other | Admitting: Physical Therapy

## 2022-03-11 ENCOUNTER — Encounter: Payer: Self-pay | Admitting: Physical Therapy

## 2022-03-11 ENCOUNTER — Ambulatory Visit: Payer: Medicaid Other | Admitting: Physical Therapy

## 2022-03-11 DIAGNOSIS — R2689 Other abnormalities of gait and mobility: Secondary | ICD-10-CM | POA: Diagnosis not present

## 2022-03-11 DIAGNOSIS — M6281 Muscle weakness (generalized): Secondary | ICD-10-CM

## 2022-03-11 DIAGNOSIS — R2681 Unsteadiness on feet: Secondary | ICD-10-CM

## 2022-03-11 DIAGNOSIS — R278 Other lack of coordination: Secondary | ICD-10-CM

## 2022-03-12 NOTE — Therapy (Signed)
[place] MAIN [facility] SERVICES [address] [place], [place], [address] Phone: [phone]   Fax:  [phone]  Physical Therapy Treatment Physical Therapy Progress Note   Dates of reporting period  02/11/22   to   03/11/22  Patient Details  Name: Renee Walton MRN: [id number] Date of Birth: 09-12-1996 No data recorded  Encounter Date: 03/11/2022   PT End of Session - 03/11/22 1154     Visit Number 30    Number of Visits 39    Date for PT Re-Evaluation 05/06/22    Authorization Type Medicaid (27 annual visit PT/OT), visits for 2023: 6    Authorization Time Period authorized 12 visits, 1/30-4/23    Authorization - Visit Number 6    Authorization - Number of Visits 12    PT Start Time 7793    PT Stop Time 1230    PT Time Calculation (min) 42 min    Equipment Utilized During Treatment Gait belt    Activity Tolerance Patient tolerated treatment well;No increased pain    Behavior During Therapy WFL for tasks assessed/performed             Past Medical History:  Diagnosis Date   Breakthrough bleeding    Cerebral palsy (HCC)    GAD (generalized anxiety disorder)    Migraine headache     Past Surgical History:  Procedure Laterality Date   LEG SURGERY     bilateral x2 in 2008    There were no vitals filed for this visit.   Subjective Assessment - 03/11/22 1152     Subjective Pt reports doing well last week and was able to do some exercises as well as walking more outside and has been helping her mom out at home. She does report having anxiety yesterday with some family changes.    Patient is accompained by: Family member    Pertinent History Pt returns to therapy after previous bout of CP. Pt states her major goal with therapy is to improve her mobility as much as possible before returning to [place] this upcoming September. Pt states [place] is not as ADA compliant for her as the [place] and wants to improve her walking  and balance to feel more confident because she can not bring her walker on the airplane. Pt reports meeting with MD about possible foot alignment surgery on April 19th and her MD in [place] wants to perform a hip adductor release. 2 falls have occurred in last 6 months due to balance loss but no injury and able to stand indep with use of UE's to helpherself up. Pt does not specify which direction her LOB occurs. Pt uses gym 2x/week for stretching and leg exercises and is motivated to improve her mobility.    Limitations Standing;Walking    How long can you sit comfortably? Unlimited    How long can you stand comfortably? 2 hours    How long can you walk comfortably? 30 min    Patient Stated Goals Improve mobility so pt can safely walk in [place].    Currently in Pain? No/denies    Pain Onset In the past 7 days    Multiple Pain Sites No                [facility] PT Assessment - 03/12/22 0001       Strength   Right Hip Flexion 3/5    Left Hip Flexion 3+/5    Right Knee Flexion 3-/5  Right Knee Extension 3+/5    Left Knee Flexion 3+/5    Left Knee Extension 3+/5      Standardized Balance Assessment   Five times sit to stand comments  13.64 sec with arms across chest, slightly slower than 02/11/22 which was 9.92 sec, but exhibits better safety and positioning;    10 Meter Walk 0.9 m/s without AD, CGA for safety, improved from 0.5 m/s on 10/08/21      Berg Balance Test   Sit to Stand Able to stand without using hands and stabilize independently    Standing Unsupported Able to stand 2 minutes with supervision    Sitting with Back Unsupported but Feet Supported on Floor or Stool Able to sit safely and securely 2 minutes    Stand to Sit Sits safely with minimal use of hands    Transfers Able to transfer safely, definite need of hands    Standing Unsupported with Eyes Closed Able to stand 10 seconds safely    Standing Unsupported with Feet Together Able to place feet together  independently and stand for 1 minute with supervision    From Standing, Reach Forward with Outstretched Arm Can reach forward >12 cm safely (5")    From Standing Position, Pick up Object from [place] to pick up shoe, needs supervision    From Standing Position, Turn to Look Behind Over each Shoulder Looks behind one side only/other side shows less weight shift    Turn 360 Degrees Needs assistance while turning    Standing Unsupported, Alternately Place Feet on Step/Stool Needs assistance to keep from falling or unable to try    Standing Unsupported, One Foot in Front Needs help to step but can hold 15 seconds    Standing on One Leg Unable to try or needs assist to prevent fall    Total Score 35    Berg comment: 100% risk for falls, improved from 33/56 on 02/11/22                TREATMENT: PT performed PROM to help alleviate tightness/stiffness and reduce spasticity/tone;  Patient hooklying: Single knee to chest 20 sec hold x1 rep each LE Passive hip circles clockwise/counterclockwise x5 reps each LE PT crossing single leg over contralateral knee with IT band stretch x5 reps PROM; PT performed passive SLR hamstring stretch with hip adductor stretch x60 sec hold x2  reps with therapist providing stabilization to contralateral side to facilitate better hip adductor stretch;  PT performed passive SLR hamstring stretch in flexion to stretch hamstring with therapist stabilizing contralateral leg to reduce tethering; Patient continues to have increased tone which limits ROM;   Instructed patient in outcome measures, see above;  Patient has made some improvements in goals. She is more adherent with HEP She is scheduled to get Botox in hip adductors later this week. Plan to increase frequency to 1x a week after Botox to progress strengthening in hip abductors  Patient's condition has the potential to improve in response to therapy. Maximum improvement is yet to be obtained. The  anticipated improvement is attainable and reasonable in a generally predictable time.  Patient reports adherence with HEP and is doing more at home.                     PT Education - 03/11/22 1154     Education provided Yes    Education Details exercise position; progress towards goals;    Person(s) Educated Patient    Methods  Explanation;Verbal cues    Comprehension Verbalized understanding;Returned demonstration;Verbal cues required;Need further instruction              PT Short Term Goals - 03/11/22 1155       PT SHORT TERM GOAL #1   Title Pt will be indep with performing HEP 3x/week to improve LE tone and strength for improved safety with at home ambulation and ADL's.    Baseline 3/10: Given LE stretches, 6/16: going to gym 1x a week, doing stretches 1x a week; 9/15: 2x a week going to gym; 4/24: getting massage/stretching 2x a week; hasn't been going to gym during ramadan. 5/22: doing them daily in last week ;    Time 4    Period Weeks    Status Achieved    Target Date 11/05/21               PT Long Term Goals - 03/11/22 1155       PT LONG TERM GOAL #1   Title Patient will increase Berg Balance score by > 6 points to demonstrate decreased fall risk during functional activities.    Baseline 12/28/20: 32/56 6/16:33/56, 9/15: 35/56, 12/19: deferred, 4/24: 33/56, 5/22: 35/56    Time 12    Period Weeks    Status Partially Met    Target Date 05/06/22      PT LONG TERM GOAL #2   Title Pt will reduce 5xsSTS time to < 8 sec to demonstrate clinically significant improvement in LE strength and balance to reduce risk of falls.    Baseline 3/10: 10.33 sec no UE support, 6/16: 6 sec with arms across chest (partial ROM), but requires 12 sec for full sit to stand; 9/15: 9.10 sec with arms across chest; 12/19: 11 sec with arms across chest, 4/24: 9.92 sec with arms across chest, 5/22: 13.64 with arms across chest, better safety    Time 12    Period Weeks     Status Partially Met    Target Date 05/06/22      PT LONG TERM GOAL #3   Title Pt will improve 6 MWT to > 1000' with no AD and no LOB to demonstrate ability to improve safety with community distances.    Baseline 3/10: 510', 6/16: 920 feet with CGA, 9/15: 850 feet, 12/19: deferred; 4/24: 950 feet with min A, 5/22: deferred due to time;    Time 12    Period Weeks    Status Partially Met    Target Date 05/06/22      PT LONG TERM GOAL #4   Title Pt will improve 10 m gait speed with no AD > 1.0 m/s to demonstrate reduced risk of falls along with safe ability to complete community walking tasks.    Baseline 3/10: 0.78 m/s, 6/16: 0.84 m/s, 9/15: 0.99 m/s, 12/19: 0.5 m/s, 4/24: deferred; 5/22: 0.9 m/s    Time 12    Period Weeks    Status Partially Met    Target Date 05/06/22      PT LONG TERM GOAL #5   Title Pt will improve FOTO score to 64 demonstrate perceived improvements in ability to complete ADL's.    Baseline 3/10: Risk adjusted 51; 04/19/21: 59, 9/15: 63%, 12/19: deferred; 4/24: 58%    Time 12    Period Weeks    Status On-going    Target Date 05/06/22                   Plan -  03/12/22 1049     Clinical Impression Statement Patient motivated and participated well within session. she was instructed in passive stretches to help disassociate LE and reduce spasticity/tone. Patient tolerated stretches well. She expressed adherence with HEP and reports she has been doing more at home. Patient instructed in outcome measures to address progress towards goals. She does exhibit improvement in LE strength, although does exhibit continued significant weakness especially in posterior hip muscles which limits stance control. She is scheduled to get Botox later this week to address hip adductor tone. Plan to progress strengthening and increase intensity of exercise after Botox to help facilitate better hip abductor strengthening. Will increase frequency to 1x a week starting in June after  Botox injection.    Personal Factors and Comorbidities Comorbidity 2;Fitness;Past/Current Experience;Time since onset of injury/illness/exacerbation;Age    Comorbidities CP and anxiety;    Examination-Activity Limitations Bed Mobility    Examination-Participation Restrictions Church;Cleaning;Community Activity;Driving;Laundry;Medication Management;Meal Prep;Shop;Volunteer;Yard Work    Stability/Clinical Decision Making Unstable/Unpredictable    Rehab Potential Fair    PT Frequency 1x / week    PT Duration 12 weeks    PT Treatment/Interventions Patient/family education;Manual techniques;Therapeutic activities;Therapeutic exercise;Electrical Stimulation;Aquatic Therapy;DME Instruction;Functional mobility training;Gait training;Neuromuscular re-education;ADLs/Self Care Home Management;Cryotherapy;Moist Heat;Stair training;Orthotic Fit/Training;Energy conservation    PT Next Visit Plan LE stretching, balance, gait    PT Home Exercise Plan Long sitting hamstring stretch, prone quad stretch, figure 4/FABER stretch, standing/long sitting calf stretch; no updates    Consulted and Agree with Plan of Care Patient             Patient will benefit from skilled therapeutic intervention in order to improve the following deficits and impairments:  Abnormal gait, Decreased balance, Decreased endurance, Decreased mobility, Difficulty walking, Cardiopulmonary status limiting activity, Decreased activity tolerance, Decreased safety awareness, Decreased strength  Visit Diagnosis: Other abnormalities of gait and mobility  Unsteadiness on feet  Muscle weakness (generalized)  Other lack of coordination     Problem List Patient Active Problem List   Diagnosis Date Noted   Loss of weight 07/17/2015   Depression 07/17/2015   Cerebral palsy ([place]) 07/17/2015    Trotter,Margaret, PT, DPT 03/12/2022, 10:57 AM  [place], [place], [address] Phone: [phone]   Fax:  [phone]  Name: Renee Walton MRN: [id number] Date of Birth: [birth year]

## 2022-03-25 ENCOUNTER — Ambulatory Visit: Payer: Medicaid Other | Attending: Internal Medicine

## 2022-03-25 ENCOUNTER — Ambulatory Visit: Payer: Medicaid Other | Admitting: Physical Therapy

## 2022-03-25 DIAGNOSIS — G809 Cerebral palsy, unspecified: Secondary | ICD-10-CM | POA: Diagnosis present

## 2022-03-25 DIAGNOSIS — M6281 Muscle weakness (generalized): Secondary | ICD-10-CM | POA: Diagnosis present

## 2022-03-25 DIAGNOSIS — R2689 Other abnormalities of gait and mobility: Secondary | ICD-10-CM | POA: Diagnosis present

## 2022-03-25 DIAGNOSIS — R278 Other lack of coordination: Secondary | ICD-10-CM

## 2022-03-25 DIAGNOSIS — R2681 Unsteadiness on feet: Secondary | ICD-10-CM | POA: Diagnosis present

## 2022-03-25 NOTE — Therapy (Signed)
[place] MAIN [facility] SERVICES [address] [place], [place], [address] Phone: [phone]   Fax:  [phone]  Physical Therapy Treatment  Patient Details  Name: Renee Walton MRN: [id number] Date of Birth: 06/30/1996 No data recorded  Encounter Date: 03/25/2022   PT End of Session - 03/25/22 1619     Visit Number 31    Number of Visits 39    Date for PT Re-Evaluation 05/06/22    Authorization Type Medicaid (27 annual visit PT/OT): cert period 0/53/97-6/73/41    Authorization Time Period 02/25/22-06/02/22 for 12 visits    Authorization - Visit Number 3    Authorization - Number of Visits 12    Progress Note Due on Visit 68    PT Start Time 1139    PT Stop Time 1224    PT Time Calculation (min) 45 min    Equipment Utilized During Treatment Gait belt    Activity Tolerance Patient tolerated treatment well;No increased pain;Patient limited by fatigue    Behavior During Therapy [facility] for tasks assessed/performed             Past Medical History:  Diagnosis Date   Breakthrough bleeding    Cerebral palsy ([place])    GAD (generalized anxiety disorder)    Migraine headache     Past Surgical History:  Procedure Laterality Date   LEG SURGERY     bilateral x2 in 2008    There were no vitals filed for this visit.   Subjective Assessment - 03/25/22 1142     Subjective Pt reports doing well today. She confirms she is now s/p first time botox injections to adductors bilat. She reports no pain during or after procedure, denies any noticable difference at all, her mother also reports no noticing any differences from a caregiver standpoint as she assists with stretching twice daily.    Pertinent History Pt returns to therapy after previous bout of CP. Pt states her major goal with therapy is to improve her mobility as much as possible before returning to [place] this upcoming September. Pt states [place] is not as ADA compliant for her as the [place] and wants to improve her walking and balance to feel more confident because she can not bring her walker on the airplane. Pt reports meeting with MD about possible foot alignment surgery on April 19th and her MD in [place] wants to perform a hip adductor release. 2 falls have occurred in last 6 months due to balance loss but no injury and able to stand indep with use of UE's to helpherself up. Pt does not specify which direction her LOB occurs. Pt uses gym 2x/week for stretching and leg exercises and is motivated to improve her mobility.    Patient Stated Goals Improve mobility so pt can safely walk in [place].    Currently in Pain? No/denies             TREATMENT TODAY  -overground AMB s device 32f + turnarounds  Supine BLE P/ROM stretching -frontal plane hip ABDCT stretch 2x60sec -short adductor frog pose stretch 2x60sec with feet together, green ball at knees -SKTC stretch with CL limb stabilized in neutral 2x60sec bilat -hamstrings stretching 2x60sec bilat (conservative on subjective stretch as ROM does not indicated a significant shortening)   *educated mom on how to assist with seated hamstrings stretch, mom appllies gentle minA at lumbar spine for trunk flexion 1x60sec bilat *also discussed lying on floor with foot in doorframe *pictures issued  on handout  Supine Strength Building  -hip ABDCT heel slide 2x10 bilat, cues to avoid flexion of hip, author reduces friction  -hip yellow TB clam 2x10x3secH, ball spacer at feet   AMB overground -laps within rehab gym, cues for weaving around obstacles, turning, no LOB  -over red mat (too limiting without device), up and down 4" step (too limiting without device), around blue cone     PT Education - 03/25/22 1622     Education provided Yes    Education Details updated hamstrings stretch as mother unable to help with this    Person(s) Educated Patient    Methods Explanation;Demonstration    Comprehension Verbalized  understanding;Need further instruction              PT Short Term Goals - 03/11/22 1155       PT SHORT TERM GOAL #1   Title Pt will be indep with performing HEP 3x/week to improve LE tone and strength for improved safety with at home ambulation and ADL's.    Baseline 3/10: Given LE stretches, 6/16: going to gym 1x a week, doing stretches 1x a week; 9/15: 2x a week going to gym; 4/24: getting massage/stretching 2x a week; hasn't been going to gym during ramadan. 5/22: doing them daily in last week ;    Time 4    Period Weeks    Status Achieved    Target Date 11/05/21               PT Long Term Goals - 03/11/22 1155       PT LONG TERM GOAL #1   Title Patient will increase Berg Balance score by > 6 points to demonstrate decreased fall risk during functional activities.    Baseline 12/28/20: 32/56 6/16:33/56, 9/15: 35/56, 12/19: deferred, 4/24: 33/56, 5/22: 35/56    Time 12    Period Weeks    Status Partially Met    Target Date 05/06/22      PT LONG TERM GOAL #2   Title Pt will reduce 5xsSTS time to < 8 sec to demonstrate clinically significant improvement in LE strength and balance to reduce risk of falls.    Baseline 3/10: 10.33 sec no UE support, 6/16: 6 sec with arms across chest (partial ROM), but requires 12 sec for full sit to stand; 9/15: 9.10 sec with arms across chest; 12/19: 11 sec with arms across chest, 4/24: 9.92 sec with arms across chest, 5/22: 13.64 with arms across chest, better safety    Time 12    Period Weeks    Status Partially Met    Target Date 05/06/22      PT LONG TERM GOAL #3   Title Pt will improve 6 MWT to > 1000' with no AD and no LOB to demonstrate ability to improve safety with community distances.    Baseline 3/10: 510', 6/16: 920 feet with CGA, 9/15: 850 feet, 12/19: deferred; 4/24: 950 feet with min A, 5/22: deferred due to time;    Time 12    Period Weeks    Status Partially Met    Target Date 05/06/22      PT LONG TERM GOAL #4    Title Pt will improve 10 m gait speed with no AD > 1.0 m/s to demonstrate reduced risk of falls along with safe ability to complete community walking tasks.    Baseline 3/10: 0.78 m/s, 6/16: 0.84 m/s, 9/15: 0.99 m/s, 12/19: 0.5 m/s, 4/24: deferred; 5/22: 0.9 m/s  Time 12    Period Weeks    Status Partially Met    Target Date 05/06/22      PT LONG TERM GOAL #5   Title Pt will improve FOTO score to 64 demonstrate perceived improvements in ability to complete ADL's.    Baseline 3/10: Risk adjusted 51; 04/19/21: 59, 9/15: 63%, 12/19: deferred; 4/24: 58%    Time 12    Period Weeks    Status On-going    Target Date 05/06/22                   Plan - 03/25/22 1623     Clinical Impression Statement First session since adductor botox therapies. Pt tolerates session well in general, is performing stretches at home twice daily with assist from mom, can give general feed back on stretch intensity. Pt asks for help wth hamstrings stretch as mom has struggled to perform with pt's ankle over mom's shoulder. Continued with hip ABDCT strength and gait training overground to maximize righting strategies for postural control. Pt seems more thrown off balance wise from tightness in ankle PF this date, lot smore retropulsive LOB than what is common in session. Very fatigued by end of session, 4" step simply too high to be a prodcutive practice height without device bu tran out of time.    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    Surveyor, mining    Rehab Potential Fair    PT Frequency 1x / week    PT  Duration 12 weeks    PT Treatment/Interventions Patient/family education;Manual techniques;Therapeutic activities;Therapeutic exercise;Electrical Stimulation;Aquatic Therapy;DME Instruction;Functional mobility training;Gait training;Neuromuscular re-education;ADLs/Self Care Home Management;Cryotherapy;Moist Heat;Stair training;Orthotic Fit/Training;Energy conservation    Consulted and Agree with Plan of Care Patient;Family member/caregiver    Family Member Consulted mom             Patient will benefit from skilled therapeutic intervention in order to improve the following deficits and impairments:  Abnormal gait, Decreased balance, Decreased endurance, Decreased mobility, Difficulty walking, Cardiopulmonary status limiting activity, Decreased activity tolerance, Decreased safety awareness, Decreased strength  Visit Diagnosis: Other abnormalities of gait and mobility  Unsteadiness on feet  Muscle weakness (generalized)  Other lack of coordination  Cerebral palsy, unspecified type [facility])     Problem List Patient Active Problem List   Diagnosis Date Noted   Loss of weight 07/17/2015   Depression 07/17/2015   Cerebral palsy ([place]) 07/17/2015   4:33 PM, 03/25/22 Etta Grandchild, PT, DPT Physical Therapist - [facility]  Outpatient Physical Therapy- [place] [phone]     [place], PT 03/25/2022, 4:26 PM  [place] MAIN [facility] SERVICES [address] [place], [place], [address] Phone: [phone]   Fax:  [phone]  Name: Renee Walton MRN: [id number] Date of Birth: Aug 10, 1996

## 2022-03-26 ENCOUNTER — Ambulatory Visit: Payer: Medicaid Other | Admitting: Physical Therapy

## 2022-04-01 ENCOUNTER — Encounter: Payer: Self-pay | Admitting: Physical Therapy

## 2022-04-01 ENCOUNTER — Ambulatory Visit: Payer: Medicaid Other | Admitting: Physical Therapy

## 2022-04-01 DIAGNOSIS — R2689 Other abnormalities of gait and mobility: Secondary | ICD-10-CM

## 2022-04-01 DIAGNOSIS — M6281 Muscle weakness (generalized): Secondary | ICD-10-CM

## 2022-04-01 DIAGNOSIS — R2681 Unsteadiness on feet: Secondary | ICD-10-CM

## 2022-04-01 DIAGNOSIS — R278 Other lack of coordination: Secondary | ICD-10-CM

## 2022-04-01 NOTE — Therapy (Signed)
[place] MAIN [facility] SERVICES [address] [place], [place], [address] Phone: [phone]   Fax:  [phone]  Physical Therapy Treatment  Patient Details  Name: Renee Walton MRN: [id number] Date of Birth: Aug 07, 1996 No data recorded  Encounter Date: 04/01/2022   PT End of Session - 04/01/22 1109     Visit Number 32    Number of Visits 39    Date for PT Re-Evaluation 05/06/22    Authorization Type Medicaid (27 annual visit PT/OT): cert period 3/35/45-04/14/62    Authorization Time Period 02/25/22-06/02/22 for 12 visits    Authorization - Visit Number 3    Authorization - Number of Visits 12    Progress Note Due on Visit 40    PT Start Time 1107    PT Stop Time 1155    PT Time Calculation (min) 48 min    Equipment Utilized During Treatment Gait belt    Activity Tolerance Patient tolerated treatment well;No increased pain;Patient limited by fatigue    Behavior During Therapy [facility] for tasks assessed/performed             Past Medical History:  Diagnosis Date   Breakthrough bleeding    Cerebral palsy ([place])    GAD (generalized anxiety disorder)    Migraine headache     Past Surgical History:  Procedure Laterality Date   LEG SURGERY     bilateral x2 in 2008    There were no vitals filed for this visit.   Subjective Assessment - 04/01/22 1108     Subjective Patient reports doing well. She reports no change after BOTOX however her mom reports feeling like her legs are lighter when she is stretching them. She reports her mom stretches her 2x a day. She is also going to gym 2x a week;    Pertinent History Pt returns to therapy after previous bout of CP. Pt states her major goal with therapy is to improve her mobility as much as possible before returning to [place] this upcoming September. Pt states [place] is not as ADA compliant for her as the [place] and wants to improve her walking and balance to feel more confident because  she can not bring her walker on the airplane. Pt reports meeting with MD about possible foot alignment surgery on April 19th and her MD in [place] wants to perform a hip adductor release. 2 falls have occurred in last 6 months due to balance loss but no injury and able to stand indep with use of UE's to helpherself up. Pt does not specify which direction her LOB occurs. Pt uses gym 2x/week for stretching and leg exercises and is motivated to improve her mobility.    Patient Stated Goals Improve mobility so pt can safely walk in [place].    Currently in Pain? No/denies                   TREATMENT: PT performed PROM to help alleviate tightness/stiffness and reduce spasticity/tone;   Patient hooklying: Single knee to chest 20 sec hold x1 rep each LE Passive hip circles clockwise/counterclockwise x5 reps each LE PT crossing single leg over contralateral knee with IT band stretch x5 reps PROM; PT performed passive SLR hamstring stretch with hip adductor stretch x60 sec hold x2  reps with therapist providing stabilization to contralateral side to facilitate better hip adductor stretch;  PT performed passive SLR hamstring stretch in flexion to stretch hamstring with therapist stabilizing contralateral leg to reduce tethering;  Pt able to achieve increased ROM compared to previous sessions likely from recent BOTOX injection;    Hooklying: Hip abduction/ER yellow t band x10 reps, partial ROM, requiring min A and mod VCS for correct positioning/exercise technique;  Bridge with legs apart x15 reps with mod VCs to keep knees apart for better gluteal activation;  Sidelying: Clamshells yellow tband x10 reps RLE only, partial ROM Attempted LLE, unable to activate muscle  Attempted without resistance band with LLE, still unable to activate gluteal for strengthening;   Prone Hip extension 2x10 reps RLE, required min A to stabilize LLE to avoid tethering Attempted LLE, unable to achieve without  heavy compensation with poor gluteal activation LLE hamstring curl x10 reps with partial ROM but minimal compensation Gluteal max hip extension x10 reps each LE with min A and mod VCs for correct technique to avoid compensation to isolate gluteal activation  Qped: Alternate hip extension 2x5 with mod VCS for positioning, min A for trunk control and cues for exercise technique;    Pt ambulated around gym x80 feet with min A with cues to avoid hip ER and reduce lateral trunk lean; She compensates for weakness in gluteal muscles with increased lateral trunk lean during stance and ER feet for increased base of support.   Provided written HEP for increased adherence;                      PT Education - 04/01/22 1109     Education provided Yes    Education Details positioning/exercise;    Person(s) Educated Patient    Methods Explanation;Verbal cues    Comprehension Verbalized understanding;Returned demonstration;Verbal cues required;Need further instruction              PT Short Term Goals - 03/11/22 1155       PT SHORT TERM GOAL #1   Title Pt will be indep with performing HEP 3x/week to improve LE tone and strength for improved safety with at home ambulation and ADL's.    Baseline 3/10: Given LE stretches, 6/16: going to gym 1x a week, doing stretches 1x a week; 9/15: 2x a week going to gym; 4/24: getting massage/stretching 2x a week; hasn't been going to gym during ramadan. 5/22: doing them daily in last week ;    Time 4    Period Weeks    Status Achieved    Target Date 11/05/21               PT Long Term Goals - 03/11/22 1155       PT LONG TERM GOAL #1   Title Patient will increase Berg Balance score by > 6 points to demonstrate decreased fall risk during functional activities.    Baseline 12/28/20: 32/56 6/16:33/56, 9/15: 35/56, 12/19: deferred, 4/24: 33/56, 5/22: 35/56    Time 12    Period Weeks    Status Partially Met    Target Date 05/06/22       PT LONG TERM GOAL #2   Title Pt will reduce 5xsSTS time to < 8 sec to demonstrate clinically significant improvement in LE strength and balance to reduce risk of falls.    Baseline 3/10: 10.33 sec no UE support, 6/16: 6 sec with arms across chest (partial ROM), but requires 12 sec for full sit to stand; 9/15: 9.10 sec with arms across chest; 12/19: 11 sec with arms across chest, 4/24: 9.92 sec with arms across chest, 5/22: 13.64 with arms across chest, better safety  Time 12    Period Weeks    Status Partially Met    Target Date 05/06/22      PT LONG TERM GOAL #3   Title Pt will improve 6 MWT to > 1000' with no AD and no LOB to demonstrate ability to improve safety with community distances.    Baseline 3/10: 510', 6/16: 920 feet with CGA, 9/15: 850 feet, 12/19: deferred; 4/24: 950 feet with min A, 5/22: deferred due to time;    Time 12    Period Weeks    Status Partially Met    Target Date 05/06/22      PT LONG TERM GOAL #4   Title Pt will improve 10 m gait speed with no AD > 1.0 m/s to demonstrate reduced risk of falls along with safe ability to complete community walking tasks.    Baseline 3/10: 0.78 m/s, 6/16: 0.84 m/s, 9/15: 0.99 m/s, 12/19: 0.5 m/s, 4/24: deferred; 5/22: 0.9 m/s    Time 12    Period Weeks    Status Partially Met    Target Date 05/06/22      PT LONG TERM GOAL #5   Title Pt will improve FOTO score to 64 demonstrate perceived improvements in ability to complete ADL's.    Baseline 3/10: Risk adjusted 51; 04/19/21: 59, 9/15: 63%, 12/19: deferred; 4/24: 58%    Time 12    Period Weeks    Status On-going    Target Date 05/06/22                   Plan - 04/01/22 1210     Clinical Impression Statement Patient motivated and participated well within session. She does exhibit less tone and better flexibility with LE stretches/ROM. Patient able to achieve increased hip abduction PROM compared to previous sessions. She was instructed in advanced LE  strengthening, focusing on gluteal activation. Patient does require mod VCS for proper positioning/exercise technique to isolate optimal muscle activation. She does exhibit increased difficulty activating LLE gluteal muscles. She required min A and mod-max VCs for correct exercise technique. She was educated on importance of strengthening at home. Provided written HEP for increased adherence. Patient verbalized undestanding; She would benefit from additional skilled PT intervention to improve strength and mobility;    Personal Factors and Comorbidities Comorbidity 2;Fitness;Past/Current Experience;Time since onset of injury/illness/exacerbation;Age    Comorbidities CP and anxiety;    Examination-Activity Limitations Bed Mobility    Examination-Participation Restrictions Church;Cleaning;Community Activity;Driving;Laundry;Medication Management;Meal Prep;Shop;Volunteer;Yard Work    Stability/Clinical Decision Making Unstable/Unpredictable    Rehab Potential Fair    PT Frequency 1x / week    PT Duration 12 weeks    PT Treatment/Interventions Patient/family education;Manual techniques;Therapeutic activities;Therapeutic exercise;Electrical Stimulation;Aquatic Therapy;DME Instruction;Functional mobility training;Gait training;Neuromuscular re-education;ADLs/Self Care Home Management;Cryotherapy;Moist Heat;Stair training;Orthotic Fit/Training;Energy conservation    Consulted and Agree with Plan of Care Patient;Family member/caregiver    Family Member Consulted mom             Patient will benefit from skilled therapeutic intervention in order to improve the following deficits and impairments:  Abnormal gait, Decreased balance, Decreased endurance, Decreased mobility, Difficulty walking, Cardiopulmonary status limiting activity, Decreased activity tolerance, Decreased safety awareness, Decreased strength  Visit Diagnosis: Other abnormalities of gait and mobility  Unsteadiness on feet  Muscle  weakness (generalized)  Other lack of coordination     Problem List Patient Active Problem List   Diagnosis Date Noted   Loss of weight 07/17/2015   Depression 07/17/2015   Cerebral  palsy ([place]) 07/17/2015    Emir Nack, PT, DPT 04/01/2022, 12:16 PM  [place] MAIN [facility] SERVICES [address] [place], [place], [address] Phone: [phone]   Fax:  [phone]  Name: Renee Walton MRN: [id number] Date of Birth: [birth year]

## 2022-04-01 NOTE — Patient Instructions (Signed)
Access Code: [email or web address] URL: [email or web address] Date: 04/01/2022 Prepared by: Zettie Pho  Exercises - Supine Bridge with Resistance Band  - 1 x daily - 7 x weekly - 2 sets - 15 reps - Prone Hip Extension  - 1 x daily - 7 x weekly - 2 sets - 10 reps - Prone Knee Flexion  - 1 x daily - 7 x weekly - 2 sets - 10 reps - Quadruped Hip Extension Kicks  - 1 x daily - 7 x weekly - 2 sets - 5 reps

## 2022-04-08 ENCOUNTER — Ambulatory Visit: Payer: Medicaid Other | Admitting: Physical Therapy

## 2022-04-08 ENCOUNTER — Encounter: Payer: Self-pay | Admitting: Physical Therapy

## 2022-04-08 DIAGNOSIS — R2689 Other abnormalities of gait and mobility: Secondary | ICD-10-CM | POA: Diagnosis not present

## 2022-04-08 DIAGNOSIS — R2681 Unsteadiness on feet: Secondary | ICD-10-CM

## 2022-04-08 DIAGNOSIS — M6281 Muscle weakness (generalized): Secondary | ICD-10-CM

## 2022-04-08 DIAGNOSIS — R278 Other lack of coordination: Secondary | ICD-10-CM

## 2022-04-08 NOTE — Therapy (Unsigned)
[place] MAIN [facility] SERVICES [address] [place], [place], [address] Phone: [phone]   Fax:  [phone]  Physical Therapy Treatment  Patient Details  Name: Renee Walton MRN: [id number] Date of Birth: 05/01/1996 No data recorded  Encounter Date: 04/08/2022    Past Medical History:  Diagnosis Date   Breakthrough bleeding    Cerebral palsy ([place])    GAD (generalized anxiety disorder)    Migraine headache     Past Surgical History:  Procedure Laterality Date   LEG SURGERY     bilateral x2 in 2008    There were no vitals filed for this visit.   Subjective Assessment - 04/08/22 1152     Subjective Patient reports doing well. She still has soreness early in the morning which goes away as the day progresses. Her mom has been helping with some of the exercises, but they are having trouble with qped exercise.    Pertinent History Pt returns to therapy after previous bout of CP. Pt states her major goal with therapy is to improve her mobility as much as possible before returning to [place] this upcoming September. Pt states [place] is not as ADA compliant for her as the [place] and wants to improve her walking and balance to feel more confident because she can not bring her walker on the airplane. Pt reports meeting with MD about possible foot alignment surgery on April 19th and her MD in [place] wants to perform a hip adductor release. 2 falls have occurred in last 6 months due to balance loss but no injury and able to stand indep with use of UE's to helpherself up. Pt does not specify which direction her LOB occurs. Pt uses gym 2x/week for stretching and leg exercises and is motivated to improve her mobility.    Patient Stated Goals Improve mobility so pt can safely walk in [place].    Currently in Pain? No/denies                   TREATMENT: PT performed PROM to help alleviate tightness/stiffness and reduce  spasticity/tone;   Patient hooklying: Single knee to chest 20 sec hold x1 rep each LE Passive hip circles clockwise/counterclockwise x5 reps each LE PT performed passive SLR hamstring stretch with hip adductor stretch x60 sec hold x1  reps with therapist providing stabilization to contralateral side to facilitate better hip adductor stretch;  PT performed passive SLR hamstring stretch in flexion to stretch hamstring with therapist stabilizing contralateral leg to reduce tethering; Pt able to achieve increased ROM compared to previous sessions likely from recent BOTOX injection;      Hooklying: Hip abduction/ER yellow t band 2x10 reps, partial ROM, requiring min A and mod VCS for correct positioning/exercise technique;  Bridge with legs apart x15 reps with mod VCs to keep knees apart for better gluteal activation;     Prone Hip extension x10 reps RLE, required min A to stabilize LLE to avoid tethering Attempted LLE, x5 reps with partial ROM Alternate hamstring curl x10 reps, partial ROM on RLE, heavy compensation, better ROM on LLE with less compensation;     Qped: Alternate hip extension x5 with min VCS for positioning, min A for trunk control and cues for exercise technique; decreased ROM on LLE but improved from previous session;  Sitting on heels, transitioning to tall kneeling x10 reps with better ROM and gluteal control, does require CGA for safety;      Seated:  Hamstring curl  red tband: x15 reps each LE, decreased ROM on RLE Hip flexion abduction/adduction lifting LE over 1/2 bolster x10 reps LLE, x5 reps RLE with decreased ROM noted due to weakness;  Reinforced HEP:    Pt ambulated around gym x80 feet with min A with cues to avoid hip ER and reduce lateral trunk lean; She compensates for weakness in gluteal muscles with increased lateral trunk lean during stance and ER feet for increased base of support.    Provided written HEP for increased adherence;                                     PT Short Term Goals - 03/11/22 1155       PT SHORT TERM GOAL #1   Title Pt will be indep with performing HEP 3x/week to improve LE tone and strength for improved safety with at home ambulation and ADL's.    Baseline 3/10: Given LE stretches, 6/16: going to gym 1x a week, doing stretches 1x a week; 9/15: 2x a week going to gym; 4/24: getting massage/stretching 2x a week; hasn't been going to gym during ramadan. 5/22: doing them daily in last week ;    Time 4    Period Weeks    Status Achieved    Target Date 11/05/21               PT Long Term Goals - 03/11/22 1155       PT LONG TERM GOAL #1   Title Patient will increase Berg Balance score by > 6 points to demonstrate decreased fall risk during functional activities.    Baseline 12/28/20: 32/56 6/16:33/56, 9/15: 35/56, 12/19: deferred, 4/24: 33/56, 5/22: 35/56    Time 12    Period Weeks    Status Partially Met    Target Date 05/06/22      PT LONG TERM GOAL #2   Title Pt will reduce 5xsSTS time to < 8 sec to demonstrate clinically significant improvement in LE strength and balance to reduce risk of falls.    Baseline 3/10: 10.33 sec no UE support, 6/16: 6 sec with arms across chest (partial ROM), but requires 12 sec for full sit to stand; 9/15: 9.10 sec with arms across chest; 12/19: 11 sec with arms across chest, 4/24: 9.92 sec with arms across chest, 5/22: 13.64 with arms across chest, better safety    Time 12    Period Weeks    Status Partially Met    Target Date 05/06/22      PT LONG TERM GOAL #3   Title Pt will improve 6 MWT to > 1000' with no AD and no LOB to demonstrate ability to improve safety with community distances.    Baseline 3/10: 510', 6/16: 920 feet with CGA, 9/15: 850 feet, 12/19: deferred; 4/24: 950 feet with min A, 5/22: deferred due to time;    Time 12    Period Weeks    Status Partially Met    Target Date 05/06/22      PT LONG TERM GOAL #4   Title Pt will  improve 10 m gait speed with no AD > 1.0 m/s to demonstrate reduced risk of falls along with safe ability to complete community walking tasks.    Baseline 3/10: 0.78 m/s, 6/16: 0.84 m/s, 9/15: 0.99 m/s, 12/19: 0.5 m/s, 4/24: deferred; 5/22: 0.9 m/s    Time 12    Period  Weeks    Status Partially Met    Target Date 05/06/22      PT LONG TERM GOAL #5   Title Pt will improve FOTO score to 64 demonstrate perceived improvements in ability to complete ADL's.    Baseline 3/10: Risk adjusted 51; 04/19/21: 59, 9/15: 63%, 12/19: deferred; 4/24: 58%    Time 12    Period Weeks    Status On-going    Target Date 05/06/22                    Patient will benefit from skilled therapeutic intervention in order to improve the following deficits and impairments:     Visit Diagnosis: No diagnosis found.     Problem List Patient Active Problem List   Diagnosis Date Noted   Loss of weight 07/17/2015   Depression 07/17/2015   Cerebral palsy ([place]) 07/17/2015    Lexa Coronado, PT 04/08/2022, 11:53 AM  [place] MAIN [facility] SERVICES [address] [place], [place], [address] Phone: [phone]   Fax:  [phone]  Name: Lolly Glaus MRN: [id number] Date of Birth: 1996-04-07

## 2022-04-08 NOTE — Patient Instructions (Signed)
Access Code: [email or web address] URL: [email or web address] Date: 04/08/2022 Prepared by: Zettie Pho  Exercises - Tall Kneeling Hip Hinge  - 1 x daily - 7 x weekly - 2 sets - 10 reps - Step Up  - 1 x daily - 7 x weekly - 2 sets - 10 reps

## 2022-04-15 ENCOUNTER — Ambulatory Visit: Payer: Medicaid Other | Admitting: Physical Therapy

## 2022-04-15 ENCOUNTER — Encounter: Payer: Self-pay | Admitting: Physical Therapy

## 2022-04-15 DIAGNOSIS — R2689 Other abnormalities of gait and mobility: Secondary | ICD-10-CM | POA: Diagnosis not present

## 2022-04-15 DIAGNOSIS — R278 Other lack of coordination: Secondary | ICD-10-CM

## 2022-04-15 DIAGNOSIS — R2681 Unsteadiness on feet: Secondary | ICD-10-CM

## 2022-04-15 DIAGNOSIS — M6281 Muscle weakness (generalized): Secondary | ICD-10-CM

## 2022-04-15 NOTE — Therapy (Signed)
OUTPATIENT PHYSICAL THERAPY TREATMENT NOTE   Patient Name: Renee Walton MRN: [id number] DOB:27-Mar-1996, 26 y.o., female Today's Date: 04/15/2022  PCP: Yves Dill, MD REFERRING PROVIDER: Yves Dill, MD   PT End of Session - 04/15/22 1144     Visit Number 34    Number of Visits 39    Date for PT Re-Evaluation 05/06/22    Authorization Type Medicaid (27 annual visit PT/OT): cert period 2/83/15-1/76/16    Authorization Time Period 02/25/22-06/02/22 for 12 visits    Authorization - Visit Number 3    Authorization - Number of Visits 12    Progress Note Due on Visit 40    PT Start Time 1145    PT Stop Time 1228    PT Time Calculation (min) 43 min    Equipment Utilized During Treatment Gait belt    Activity Tolerance Patient tolerated treatment well;No increased pain;Patient limited by fatigue    Behavior During Therapy [facility] for tasks assessed/performed             Past Medical History:  Diagnosis Date   Breakthrough bleeding    Cerebral palsy (HCC)    GAD (generalized anxiety disorder)    Migraine headache    Past Surgical History:  Procedure Laterality Date   LEG SURGERY     bilateral x2 in 2008   Patient Active Problem List   Diagnosis Date Noted   Loss of weight 07/17/2015   Depression 07/17/2015   Cerebral palsy (HCC) 07/17/2015    REFERRING DIAG: Spastic Cerebral Palsy  THERAPY DIAG:  Other abnormalities of gait and mobility  Unsteadiness on feet  Muscle weakness (generalized)  Other lack of coordination  Rationale for Evaluation and Treatment Rehabilitation  PERTINENT HISTORY: Pt returns to therapy after previous bout of CP. Pt states her major goal with therapy is to improve her mobility as much as possible before returning to [place] this upcoming September. Pt states [place] is not as ADA compliant for her as the [place] and wants to improve her walking and balance to feel more confident because she can not bring her walker on the airplane. Pt  reports meeting with MD about possible foot alignment surgery on April 19th and her MD in [place] wants to perform a hip adductor release. 2 falls have occurred in last 6 months due to balance loss but no injury and able to stand indep with use of UE's to helpherself up. Pt does not specify which direction her LOB occurs. Pt uses gym 2x/week for stretching and leg exercises and is motivated to improve her mobility.  PRECAUTIONS: Fall Risk  SUBJECTIVE: Pt reports falling 3-4 times in last week; She reports falling when walking. She reports she was trying to pick her feet up and fell; She reports also having increased LLE sharp pain in inner thigh/posterior leg when doing long distance walking with walker. She reports it worse when walking a long distance and at night/end of the day. She reports the pain is worse after she sits for a while and first gets up;   PAIN:  Are you having pain? Yes: NPRS scale: 2/10 Pain location: LLE medial/posterior thigh Pain description: sharp Aggravating factors: long distance walking/at end of day Relieving factors: stretching helps temporarily; Massage/heat helped;      TODAY'S TREATMENT:    TREATMENT: PT performed PROM to help alleviate tightness/stiffness and reduce spasticity/tone;   Patient hooklying: Single knee to chest 20 sec hold x1 rep each LE PT performed passive SLR hamstring stretch  with hip adductor stretch x60 sec hold x1  reps with therapist providing stabilization to contralateral side to facilitate better hip adductor stretch;  PT performed passive SLR hamstring stretch in flexion to stretch hamstring with therapist stabilizing contralateral leg to reduce tethering; Pt able to achieve increased ROM compared to previous sessions likely from recent BOTOX injection;   Re-educated in HEP: Long sitting hamstring stretch with cues to keep foot oriented forward for better hamstring stretch 20 sec hold x1 rep Butterfly hip adductor stretch 20 sec  hold x1 rep;   Hooklying: Hip abduction/ER yellow t band 2x10 reps, partial ROM, requiring min A and mod VCS for correct positioning/exercise technique;  Bridge with legs apart x15 reps with mod VCs to keep knees apart for better gluteal activation;     Prone Plank on elbows 20 sec hold x3 reps each LE; able to exhibit better positioning and hip control with increased knee extension with increased repetition;  Hip extension 2x10 reps RLE, required min A to stabilize LLE to avoid tethering Attempted LLE, 2x5 reps with partial ROM Alternate hamstring curl x10 reps, partial ROM on RLE, heavy compensation, better ROM on LLE with less compensation;  Hip abduction, knee flexed heel together, heel squeeze, gluteal contraction x5 reps, minimal gluteal activation noted;  Pt exhibits improved hip extension AROM during 2nd set with better muscle activation/recruitment;    Qped: Alternate hip extension x10 with min VCS for positioning, min A for trunk control and cues for exercise technique; decreased ROM on LLE but improved from previous session;   Sitting on heels, transitioning to tall kneeling x10 reps with better ROM and gluteal control, does require CGA for safety; Required mod VCs for neutral hip position as patient often compensates and shifts to RLE;      Tolerated well. Does report fatigue at end of session;   PATIENT EDUCATION: Education details: positioning/exercise technique Person educated: Patient and Journalist, newspaper method: Explanation, Demonstration, and Verbal cues Education comprehension: verbalized understanding, returned demonstration, verbal cues required, and needs further education   HOME EXERCISE PROGRAM: Continue as previously given;    PT Short Term Goals - 03/11/22 1155       PT SHORT TERM GOAL #1   Title Pt will be indep with performing HEP 3x/week to improve LE tone and strength for improved safety with at home ambulation and ADL's.    Baseline 3/10: Given  LE stretches, 6/16: going to gym 1x a week, doing stretches 1x a week; 9/15: 2x a week going to gym; 4/24: getting massage/stretching 2x a week; hasn't been going to gym during ramadan. 5/22: doing them daily in last week ;    Time 4    Period Weeks    Status Achieved    Target Date 11/05/21              PT Long Term Goals - 03/11/22 1155       PT LONG TERM GOAL #1   Title Patient will increase Berg Balance score by > 6 points to demonstrate decreased fall risk during functional activities.    Baseline 12/28/20: 32/56 6/16:33/56, 9/15: 35/56, 12/19: deferred, 4/24: 33/56, 5/22: 35/56    Time 12    Period Weeks    Status Partially Met    Target Date 05/06/22      PT LONG TERM GOAL #2   Title Pt will reduce 5xsSTS time to < 8 sec to demonstrate clinically significant improvement in LE strength and balance to reduce risk  of falls.    Baseline 3/10: 10.33 sec no UE support, 6/16: 6 sec with arms across chest (partial ROM), but requires 12 sec for full sit to stand; 9/15: 9.10 sec with arms across chest; 12/19: 11 sec with arms across chest, 4/24: 9.92 sec with arms across chest, 5/22: 13.64 with arms across chest, better safety    Time 12    Period Weeks    Status Partially Met    Target Date 05/06/22      PT LONG TERM GOAL #3   Title Pt will improve 6 MWT to > 1000' with no AD and no LOB to demonstrate ability to improve safety with community distances.    Baseline 3/10: 510', 6/16: 920 feet with CGA, 9/15: 850 feet, 12/19: deferred; 4/24: 950 feet with min A, 5/22: deferred due to time;    Time 12    Period Weeks    Status Partially Met    Target Date 05/06/22      PT LONG TERM GOAL #4   Title Pt will improve 10 m gait speed with no AD > 1.0 m/s to demonstrate reduced risk of falls along with safe ability to complete community walking tasks.    Baseline 3/10: 0.78 m/s, 6/16: 0.84 m/s, 9/15: 0.99 m/s, 12/19: 0.5 m/s, 4/24: deferred; 5/22: 0.9 m/s    Time 12    Period Weeks     Status Partially Met    Target Date 05/06/22      PT LONG TERM GOAL #5   Title Pt will improve FOTO score to 64 demonstrate perceived improvements in ability to complete ADL's.    Baseline 3/10: Risk adjusted 51; 04/19/21: 59, 9/15: 63%, 12/19: deferred; 4/24: 58%    Time 12    Period Weeks    Status On-going    Target Date 05/06/22              Plan - 04/15/22 1232     Clinical Impression Statement Patient motivated and participated well within session. She was instructed in advanced LE strengthening. Patient able to exhibit increased muscle activation this session compared to previous session. She does exhibit better AROM during 2nd set of exercise; Recommend patient increase sets at home for increased muscle recruitment. However despite improvement in ROM, patient has considerable weakness in BLE particularly in gluteal and hamstrings. Patient and caregiver educated in proper exercise technique including to improve postural control and positioning for optimal muscle activation. She did require min A for proper positioning to avoid compensation for better muscle activation. Patient would benefit from additional skilled PT Intervention to improve strength, balance and mobility    Personal Factors and Comorbidities Comorbidity 2;Fitness;Past/Current Experience;Time since onset of injury/illness/exacerbation;Age    Comorbidities CP and anxiety;    Examination-Activity Limitations Bed Mobility    Examination-Participation Restrictions Church;Cleaning;Community Activity;Driving;Laundry;Medication Management;Meal Prep;Shop;Volunteer;Yard Work    Stability/Clinical Decision Making Unstable/Unpredictable    Rehab Potential Fair    PT Frequency 1x / week    PT Duration 12 weeks    PT Treatment/Interventions Patient/family education;Manual techniques;Therapeutic activities;Therapeutic exercise;Electrical Stimulation;Aquatic Therapy;DME Instruction;Functional mobility training;Gait  training;Neuromuscular re-education;ADLs/Self Care Home Management;Cryotherapy;Moist Heat;Stair training;Orthotic Fit/Training;Energy conservation    Consulted and Agree with Plan of Care Patient;Family member/caregiver    Family Member Consulted mom               [place], PT, DPT 04/15/2022, 12:35 PM

## 2022-04-22 ENCOUNTER — Ambulatory Visit: Payer: Medicaid Other | Admitting: Physical Therapy

## 2022-04-25 ENCOUNTER — Encounter: Payer: Self-pay | Admitting: Physical Therapy

## 2022-04-25 ENCOUNTER — Ambulatory Visit: Payer: Medicaid Other | Attending: Internal Medicine | Admitting: Physical Therapy

## 2022-04-25 DIAGNOSIS — R2689 Other abnormalities of gait and mobility: Secondary | ICD-10-CM | POA: Diagnosis not present

## 2022-04-25 DIAGNOSIS — R2681 Unsteadiness on feet: Secondary | ICD-10-CM | POA: Insufficient documentation

## 2022-04-25 DIAGNOSIS — M6281 Muscle weakness (generalized): Secondary | ICD-10-CM | POA: Diagnosis present

## 2022-04-25 DIAGNOSIS — R278 Other lack of coordination: Secondary | ICD-10-CM | POA: Diagnosis present

## 2022-04-25 NOTE — Therapy (Signed)
OUTPATIENT PHYSICAL THERAPY TREATMENT NOTE   Patient Name: Renee Walton MRN: [id number] DOB:12/11/1995, 26 y.o., female Today's Date: 04/25/2022  PCP: Lamonte Sakai, MD REFERRING PROVIDER: Lamonte Sakai, MD   PT End of Session - 04/25/22 1148     Visit Number 35    Number of Visits 39    Date for PT Re-Evaluation 05/06/22    Authorization Type Medicaid (27 annual visit PT/OT): cert period 8/85/02-7/74/12    Authorization Time Period 02/25/22-06/02/22 for 12 visits    Authorization - Visit Number 4    Authorization - Number of Visits 12    Progress Note Due on Visit 40    PT Start Time 1148    PT Stop Time 1228    PT Time Calculation (min) 40 min    Equipment Utilized During Treatment Gait belt    Activity Tolerance Patient tolerated treatment well;No increased pain;Patient limited by fatigue    Behavior During Therapy [facility] for tasks assessed/performed             Past Medical History:  Diagnosis Date   Breakthrough bleeding    Cerebral palsy ([place])    GAD (generalized anxiety disorder)    Migraine headache    Past Surgical History:  Procedure Laterality Date   LEG SURGERY     bilateral x2 in 2008   Patient Active Problem List   Diagnosis Date Noted   Loss of weight 07/17/2015   Depression 07/17/2015   Cerebral palsy ([place]) 07/17/2015    REFERRING DIAG: Spastic Cerebral Palsy  THERAPY DIAG:  Other abnormalities of gait and mobility  Unsteadiness on feet  Muscle weakness (generalized)  Other lack of coordination  Rationale for Evaluation and Treatment Rehabilitation  PERTINENT HISTORY: Pt returns to therapy after previous bout of CP. Pt states her major goal with therapy is to improve her mobility as much as possible before returning to [place] this upcoming September. Pt states [place] is not as ADA compliant for her as the [place] and wants to improve her walking and balance to feel more confident because she can not bring her walker on the airplane. Pt  reports meeting with MD about possible foot alignment surgery on April 19th and her MD in [place] wants to perform a hip adductor release. 2 falls have occurred in last 6 months due to balance loss but no injury and able to stand indep with use of UE's to helpherself up. Pt does not specify which direction her LOB occurs. Pt uses gym 2x/week for stretching and leg exercises and is motivated to improve her mobility.  PRECAUTIONS: Fall Risk  SUBJECTIVE: Pt reports going to gym yesterday. She reports doing exercises and states that they are going well. She reports falling 2x a week. Denies any soreness; Reports HEP is going good- she feels like she is able to do more. She reports, "My goal is to not sit too long."   PAIN:  Are you having pain? No     TODAY'S TREATMENT:    TREATMENT: PT performed PROM to help alleviate tightness/stiffness and reduce spasticity/tone;   Patient hooklying: Single knee to chest 20 sec hold x1 rep each LE PT performed passive SLR hamstring stretch with hip adductor stretch x60 sec hold x1  reps with therapist providing stabilization to contralateral side to facilitate better hip adductor stretch;  PT performed passive SLR hamstring stretch in flexion to stretch hamstring with therapist stabilizing contralateral leg to reduce tethering; Pt able to achieve increased ROM compared to previous  sessions likely from recent BOTOX injection;     Hooklying: Hip abduction/ER yellow t band x10 reps, partial ROM, requiring min A and mod VCS for correct positioning/exercise technique;  Bridge with legs apart x15 reps with mod VCs to keep knees apart for better gluteal activation;Reports moderate fatigue with increased soreness/burning in posterior hip;      Prone Plank on elbows 30 sec hold x2 reps each LE; able to exhibit better positioning and hip control with increased knee extension with increased repetition;  Hip extension 1x10 reps RLE, required min A to stabilize LLE  to avoid tethering Hip extension LLE, x10 reps with partial ROM, requiring AAROM and tactile cues to avoid trunk rotation due to compensation;  Alternate hamstring curl x5 reps, partial ROM on RLE, heavy compensation, better ROM on LLE with less compensation;  Gluteal max hip extension x5 reps each LE with min A and decreased gluteal activation;      Tall kneeling:  Transition to tall kneeling while holding small ball and chest press x10 reps; Transition to 1/2 kneel (LLE in front)   Moderate difficulty requiring mod A for safety with decreased gluteal activation and heavy trunk lean compensation; Required mod A to transition to sitting;  Patient expressed concern over foot drag, especially when walking on tile barefoot. Educated pt that her extensor tone is likely kicking in when standing on cold floor and recommend patient wear socks and/or shoes for support  Educated patient in seated toe extension x5 reps Progressed to ankle DF x5 reps Pt reports she is doing DF when standing; Recommend patient do in sitting to avoid hip movement for compensation to isolate ankle ROM;       Tolerated well. Does report fatigue at end of session;   PATIENT EDUCATION: Education details: positioning/exercise technique Person educated: Patient and Caregiver Education method: Explanation, Demonstration, and Verbal cues Education comprehension: verbalized understanding, returned demonstration, verbal cues required, and needs further education   HOME EXERCISE PROGRAM: Advanced 04/25/22 Access Code: [email or web address] URL: [email or web address] Date: 04/25/2022 Prepared by: Blanche East  Exercises - Half Kneeling Hip Flexor Stretch  - 1 x daily - 7 x weekly - 1 sets - 3 reps - 15 sec hold - Seated Great Toe Extension  - 1 x daily - 7 x weekly - 1 sets - 10 reps - Seated Toe Raise  - 1 x daily - 7 x weekly - 1 sets - 10 reps   PT Short Term Goals - 03/11/22 1155       PT SHORT TERM GOAL #1    Title Pt will be indep with performing HEP 3x/week to improve LE tone and strength for improved safety with at home ambulation and ADL's.    Baseline 3/10: Given LE stretches, 6/16: going to gym 1x a week, doing stretches 1x a week; 9/15: 2x a week going to gym; 4/24: getting massage/stretching 2x a week; hasn't been going to gym during ramadan. 5/22: doing them daily in last week ;    Time 4    Period Weeks    Status Achieved    Target Date 11/05/21              PT Long Term Goals - 03/11/22 1155       PT LONG TERM GOAL #1   Title Patient will increase Berg Balance score by > 6 points to demonstrate decreased fall risk during functional activities.    Baseline 12/28/20: 32/56 6/16:33/56, 9/15: 35/56, 12/19: deferred, 4/24: 33/56, 5/22:  35/56    Time 12    Period Weeks    Status Partially Met    Target Date 05/06/22      PT LONG TERM GOAL #2   Title Pt will reduce 5xsSTS time to < 8 sec to demonstrate clinically significant improvement in LE strength and balance to reduce risk of falls.    Baseline 3/10: 10.33 sec no UE support, 6/16: 6 sec with arms across chest (partial ROM), but requires 12 sec for full sit to stand; 9/15: 9.10 sec with arms across chest; 12/19: 11 sec with arms across chest, 4/24: 9.92 sec with arms across chest, 5/22: 13.64 with arms across chest, better safety    Time 12    Period Weeks    Status Partially Met    Target Date 05/06/22      PT LONG TERM GOAL #3   Title Pt will improve 6 MWT to > 1000' with no AD and no LOB to demonstrate ability to improve safety with community distances.    Baseline 3/10: 510', 6/16: 920 feet with CGA, 9/15: 850 feet, 12/19: deferred; 4/24: 950 feet with min A, 5/22: deferred due to time;    Time 12    Period Weeks    Status Partially Met    Target Date 05/06/22      PT LONG TERM GOAL #4   Title Pt will improve 10 m gait speed with no AD > 1.0 m/s to demonstrate reduced risk of falls along with safe ability to complete  community walking tasks.    Baseline 3/10: 0.78 m/s, 6/16: 0.84 m/s, 9/15: 0.99 m/s, 12/19: 0.5 m/s, 4/24: deferred; 5/22: 0.9 m/s    Time 12    Period Weeks    Status Partially Met    Target Date 05/06/22      PT LONG TERM GOAL #5   Title Pt will improve FOTO score to 64 demonstrate perceived improvements in ability to complete ADL's.    Baseline 3/10: Risk adjusted 51; 04/19/21: 59, 9/15: 63%, 12/19: deferred; 4/24: 58%    Time 12    Period Weeks    Status On-going    Target Date 05/06/22              Plan - 04/15/22 1232     Clinical Impression Statement Patient motivated and participated well within session. She was instructed in advanced LE strengthening. Patient able to exhibit increased muscle activation this session compared to previous session. However despite improvement in ROM, patient has considerable weakness in BLE particularly in gluteal and hamstrings. Progressed HEP with gluteal strengthening and ankle strengthening; Patient and caregiver educated in proper exercise technique including to improve postural control and positioning for optimal muscle activation. She did require min A for proper positioning to avoid compensation for better muscle activation. Patient would benefit from additional skilled PT Intervention to improve strength, balance and mobility    Personal Factors and Comorbidities Comorbidity 2;Fitness;Past/Current Experience;Time since onset of injury/illness/exacerbation;Age    Comorbidities CP and anxiety;    Examination-Activity Limitations Bed Mobility    Examination-Participation Restrictions Church;Cleaning;Community Activity;Driving;Laundry;Medication Management;Meal Prep;Shop;Volunteer;Yard Work    Stability/Clinical Decision Making Unstable/Unpredictable    Rehab Potential Fair    PT Frequency 1x / week    PT Duration 12 weeks    PT Treatment/Interventions Patient/family education;Manual techniques;Therapeutic activities;Therapeutic  exercise;Electrical Stimulation;Aquatic Therapy;DME Instruction;Functional mobility training;Gait training;Neuromuscular re-education;ADLs/Self Care Home Management;Cryotherapy;Moist Heat;Stair training;Orthotic Fit/Training;Energy conservation    Consulted and Agree with Plan of Care Patient;Family member/caregiver  Family Member Consulted mom               Krysia Zahradnik, PT, DPT 04/25/2022, 12:37 PM

## 2022-04-29 ENCOUNTER — Ambulatory Visit: Payer: Medicaid Other | Admitting: Physical Therapy

## 2022-05-06 ENCOUNTER — Ambulatory Visit: Payer: Medicaid Other | Admitting: Physical Therapy

## 2022-05-07 ENCOUNTER — Encounter: Payer: Self-pay | Admitting: Physical Therapy

## 2022-05-07 ENCOUNTER — Ambulatory Visit: Payer: Medicaid Other | Admitting: Physical Therapy

## 2022-05-07 DIAGNOSIS — R2681 Unsteadiness on feet: Secondary | ICD-10-CM

## 2022-05-07 DIAGNOSIS — R278 Other lack of coordination: Secondary | ICD-10-CM

## 2022-05-07 DIAGNOSIS — M6281 Muscle weakness (generalized): Secondary | ICD-10-CM

## 2022-05-07 DIAGNOSIS — R2689 Other abnormalities of gait and mobility: Secondary | ICD-10-CM

## 2022-05-07 NOTE — Therapy (Addendum)
OUTPATIENT PHYSICAL THERAPY TREATMENT NOTE   Patient Name: Renee Walton MRN: [id number] DOB:1996/10/01, 26 y.o., female Today's Date: 05/07/2022  PCP: Lamonte Sakai, MD REFERRING PROVIDER: Lamonte Sakai, MD   PT End of Session - 05/07/22 1147     Visit Number 36    Number of Visits 61    Date for PT Re-Evaluation 07/30/22    Authorization Type Medicaid (27 annual visit PT/OT): cert period 01/03/16-04/05/06    Authorization Time Period 02/25/22-06/02/22 for 12 visits    Authorization - Visit Number 4    Authorization - Number of Visits 12    Progress Note Due on Visit 40    PT Start Time 1148    PT Stop Time 1230    PT Time Calculation (min) 42 min    Equipment Utilized During Treatment Gait belt    Activity Tolerance Patient tolerated treatment well;No increased pain;Patient limited by fatigue    Behavior During Therapy [facility] for tasks assessed/performed             Past Medical History:  Diagnosis Date   Breakthrough bleeding    Cerebral palsy ([place])    GAD (generalized anxiety disorder)    Migraine headache    Past Surgical History:  Procedure Laterality Date   LEG SURGERY     bilateral x2 in 2008   Patient Active Problem List   Diagnosis Date Noted   Loss of weight 07/17/2015   Depression 07/17/2015   Cerebral palsy ([place]) 07/17/2015    REFERRING DIAG: Spastic Cerebral Palsy  THERAPY DIAG:  Other abnormalities of gait and mobility  Unsteadiness on feet  Muscle weakness (generalized)  Other lack of coordination  Rationale for Evaluation and Treatment Rehabilitation  PERTINENT HISTORY: Pt returns to therapy after previous bout of CP. Pt states her major goal with therapy is to improve her mobility as much as possible before returning to [place] this upcoming September. Pt states [place] is not as ADA compliant for her as the [place] and wants to improve her walking and balance to feel more confident because she can not bring her walker on the airplane. Pt  reports meeting with MD about possible foot alignment surgery on April 19th and her MD in [place] wants to perform a hip adductor release. 2 falls have occurred in last 6 months due to balance loss but no injury and able to stand indep with use of UE's to helpherself up. Pt does not specify which direction her LOB occurs. Pt uses gym 2x/week for stretching and leg exercises and is motivated to improve her mobility.  PRECAUTIONS: Fall Risk  SUBJECTIVE: Pt reports when she wakes up she has pain but when she walks around it works itself out. She reports not doing exercises last week because she was on her menstrual cycle. She reports not being able to do exercise because everything gets loose and weak;   PAIN:  Are you having pain? No     TODAY'S TREATMENT:    TREATMENT: PT performed PROM to help alleviate tightness/stiffness and reduce spasticity/tone;   Patient hooklying: Single knee to chest 20 sec hold x1 rep each LE LTR with legs apart (windshield wiper) x5 reps with overpressure;  Pt able to achieve increased ROM compared to previous sessions likely from recent BOTOX injection;      Prone Plank on elbows 30 sec hold x2 reps each LE; able to exhibit better positioning and hip control with increased knee extension with increased repetition;  Hip extension 1x10 reps  RLE, required min A to stabilize LLE to avoid tethering Hip extension LLE, x10 reps with partial ROM, requiring AAROM and tactile cues to avoid trunk rotation due to compensation;  Alternate hamstring curl x5 reps, partial ROM on RLE, heavy compensation, better ROM on LLE with less compensation;    Tall kneeling:  Transition to tall kneeling 30 sec hold x2 reps;   Moderate difficulty requiring mod A for neutral weight shift with decreased gluteal activation and heavy trunk lean compensation;   PT assessed goals, see below;   [facility] PT Assessment - 05/07/22 0001       Observation/Other Assessments   Focus on  Therapeutic Outcomes (FOTO)  78%      Strength   Right Hip Extension 3/5    Left Hip Extension 2+/5    Right Knee Flexion 3-/5    Left Knee Flexion 4-/5      Standardized Balance Assessment   Five times sit to stand comments  8.2 sec with arms across chest    10 Meter Walk 0.86 m/s              Tolerated well. Does report fatigue at end of session;   PATIENT EDUCATION: Education details: positioning/exercise technique Person educated: Patient and Caregiver Education method: Explanation, Demonstration, and Verbal cues Education comprehension: verbalized understanding, returned demonstration, verbal cues required, and needs further education   HOME EXERCISE PROGRAM: Continue as given;   Advanced 04/25/22 Access Code: [email or web address] URL: [email or web address] Date: 04/25/2022 Prepared by: Blanche East  Exercises - Half Kneeling Hip Flexor Stretch  - 1 x daily - 7 x weekly - 1 sets - 3 reps - 15 sec hold - Seated Great Toe Extension  - 1 x daily - 7 x weekly - 1 sets - 10 reps - Seated Toe Raise  - 1 x daily - 7 x weekly - 1 sets - 10 reps   PT Short Term Goals -      PT SHORT TERM GOAL #1   Title Pt will be indep with performing HEP 3x/week to improve LE tone and strength for improved safety with at home ambulation and ADL's.    Baseline 3/10: Given LE stretches, 6/16: going to gym 1x a week, doing stretches 1x a week; 9/15: 2x a week going to gym; 4/24: getting massage/stretching 2x a week; hasn't been going to gym during ramadan. 5/22: doing them daily in last week ;    Time 4    Period Weeks    Status Achieved    Target Date 11/05/21              PT Long Term Goals       PT LONG TERM GOAL #1   Title Patient will increase Berg Balance score by > 6 points to demonstrate decreased fall risk during functional activities.    Baseline 12/28/20: 32/56 6/16:33/56, 9/15: 35/56, 12/19: deferred, 4/24: 33/56, 5/22: 35/56 7/18: deferred due to time;     Time 12    Period Weeks    Status Partially Met    Target Date 07/30/22     PT LONG TERM GOAL #2   Title Pt will reduce 5xsSTS time to < 8 sec to demonstrate clinically significant improvement in LE strength and balance to reduce risk of falls.    Baseline 3/10: 10.33 sec no UE support, 6/16: 6 sec with arms across chest (partial ROM), but requires 12 sec for full sit to stand; 9/15: 9.10 sec with arms across chest;  12/19: 11 sec with arms across chest, 4/24: 9.92 sec with arms across chest, 5/22: 13.64 with arms across chest, better safety 7/18: 8.2 sec with arms across chest   Time 12    Period Weeks    Status Partially Met    Target Date 07/30/22     PT LONG TERM GOAL #3   Title Pt will improve 6 MWT to > 1000' with no AD and no LOB to demonstrate ability to improve safety with community distances.    Baseline 3/10: 510', 6/16: 920 feet with CGA, 9/15: 850 feet, 12/19: deferred; 4/24: 950 feet with min A, 5/22: deferred due to time; 05/07/22: deferred due to time;    Time 12    Period Weeks    Status Partially Met    Target Date 07/30/22     PT LONG TERM GOAL #4   Title Pt will improve 10 m gait speed with no AD > 1.0 m/s to demonstrate reduced risk of falls along with safe ability to complete community walking tasks.    Baseline 3/10: 0.78 m/s, 6/16: 0.84 m/s, 9/15: 0.99 m/s, 12/19: 0.5 m/s, 4/24: deferred; 5/22: 0.9 m/s , 7/18: 0.86 m/s   Time 12    Period Weeks    Status Partially Met    Target Date 07/30/22     PT LONG TERM GOAL #5   Title Pt will improve FOTO score to 64 demonstrate perceived improvements in ability to complete ADL's.    Baseline 3/10: Risk adjusted 51; 04/19/21: 59, 9/15: 63%, 12/19: deferred; 4/24: 58% 05/07/22: 78%   Time 12    Period Weeks    Status Achieved;    Target Date 07/30/22             Plan - 04/15/22 1232     Clinical Impression Statement Patient motivated and participated well within session. She was instructed in advanced LE  strengthening. Patient able to exhibit increased muscle activation this session compared to previous session. However despite improvement in ROM, patient has considerable weakness in BLE particularly in gluteal and hamstrings.  Instructed patient in outcome measures to address progress towards goals. She does exhibit improvement in LE strength. She also exhibits improvement in sit<>Stand ability. Patient continues to have significant weakness and gait difficulty.  Patient would benefit from additional skilled PT Intervention to improve strength, balance and mobility    Personal Factors and Comorbidities Comorbidity 2;Fitness;Past/Current Experience;Time since onset of injury/illness/exacerbation;Age    Comorbidities CP and anxiety;    Examination-Activity Limitations Bed Mobility    Examination-Participation Restrictions Church;Cleaning;Community Activity;Driving;Laundry;Medication Management;Meal Prep;Shop;Volunteer;Yard Work    Stability/Clinical Decision Making Unstable/Unpredictable    Rehab Potential Fair    PT Frequency 1x / week    PT Duration 12 weeks    PT Treatment/Interventions Patient/family education;Manual techniques;Therapeutic activities;Therapeutic exercise;Electrical Stimulation;Aquatic Therapy;DME Instruction;Functional mobility training;Gait training;Neuromuscular re-education;ADLs/Self Care Home Management;Cryotherapy;Moist Heat;Stair training;Orthotic Fit/Training;Energy conservation    Consulted and Agree with Plan of Care Patient;Family member/caregiver    Family Member Consulted mom               [place], PT, DPT 05/07/2022, 1:44 PM

## 2022-05-13 ENCOUNTER — Encounter: Payer: Self-pay | Admitting: Physical Therapy

## 2022-05-13 ENCOUNTER — Ambulatory Visit: Payer: Medicaid Other | Admitting: Physical Therapy

## 2022-05-13 DIAGNOSIS — R2689 Other abnormalities of gait and mobility: Secondary | ICD-10-CM

## 2022-05-13 DIAGNOSIS — R2681 Unsteadiness on feet: Secondary | ICD-10-CM

## 2022-05-13 DIAGNOSIS — M6281 Muscle weakness (generalized): Secondary | ICD-10-CM

## 2022-05-13 DIAGNOSIS — R278 Other lack of coordination: Secondary | ICD-10-CM

## 2022-05-13 NOTE — Therapy (Signed)
OUTPATIENT PHYSICAL THERAPY TREATMENT NOTE   Patient Name: Renee Walton MRN: [id number] DOB:1996-01-03, 25 y.o., female Today's Date: 05/14/2022  PCP: Lamonte Sakai, MD REFERRING PROVIDER: Lamonte Sakai, MD   PT End of Session - 05/13/22 1154     Visit Number 37    Number of Visits 78    Date for PT Re-Evaluation 07/30/22    Authorization Type Medicaid (27 annual visit PT/OT): cert period 04/15/93-8/54/62    Authorization Time Period 02/25/22-06/02/22 for 12 visits    Authorization - Visit Number 5    Authorization - Number of Visits 12    Progress Note Due on Visit 40    PT Start Time 1148    PT Stop Time 1230    PT Time Calculation (min) 42 min    Equipment Utilized During Treatment Gait belt    Activity Tolerance Patient tolerated treatment well;No increased pain;Patient limited by fatigue    Behavior During Therapy [facility] for tasks assessed/performed             Past Medical History:  Diagnosis Date   Breakthrough bleeding    Cerebral palsy ([place])    GAD (generalized anxiety disorder)    Migraine headache    Past Surgical History:  Procedure Laterality Date   LEG SURGERY     bilateral x2 in 2008   Patient Active Problem List   Diagnosis Date Noted   Loss of weight 07/17/2015   Depression 07/17/2015   Cerebral palsy ([place]) 07/17/2015    REFERRING DIAG: Spastic Cerebral Palsy  THERAPY DIAG:  Other abnormalities of gait and mobility  Unsteadiness on feet  Muscle weakness (generalized)  Other lack of coordination  Rationale for Evaluation and Treatment Rehabilitation  PERTINENT HISTORY: Pt returns to therapy after previous bout of CP. Pt states her major goal with therapy is to improve her mobility as much as possible before returning to [place] this upcoming September. Pt states [place] is not as ADA compliant for her as the [place] and wants to improve her walking and balance to feel more confident because she can not bring her walker on the airplane. Pt  reports meeting with MD about possible foot alignment surgery on April 19th and her MD in [place] wants to perform a hip adductor release. 2 falls have occurred in last 6 months due to balance loss but no injury and able to stand indep with use of UE's to helpherself up. Pt does not specify which direction her LOB occurs. Pt uses gym 2x/week for stretching and leg exercises and is motivated to improve her mobility.  PRECAUTIONS: Fall Risk  SUBJECTIVE: Pt reports she is feeling increased burning pain in LLE today. She said she felt it when she finished going to the bathroom. No numbness/tingling. She hasn't been as sore this week. Reports going to the gym this week and using equipment. She states she has been doing her exercises, but her mom states she spends most of her time sitting with legs crossed.   PAIN:  Are you having pain? Yes: NPRS scale: 2/10 Pain location: LLE Pain description: sharp/burning Aggravating factors: standing up after going to the bathroom Relieving factors: unsure     TODAY'S TREATMENT:    TREATMENT: PT performed PROM to help alleviate tightness/stiffness and reduce spasticity/tone and alleviate LLE discomfort;    Patient hooklying: Single knee to chest 20 sec hold x1 rep each LE Hamstring stretch with ankle DF 30 sec hold x2 reps; to increase flexibility with therapist providing stabilization to  contralateral limb to avoid tethering;  Pt able to achieve increased ROM compared to previous sessions likely from recent BOTOX injection;      Educated patient in exercises she can do on her own at home:  Seated hip abduction/ER yellow tband 2x5, Patient does require cues to increase AROM without compensation (avoid holding onto table/leaning backwards) Patient also educated to increase sets and do less repetition to increase muscle activation;     Patient in floor on red mat:  Tall kneeling:  Transition to tall kneeling -holding onto table, shifting pelvis  forward to increase stretch in quad 20 sec hold  Sitting on heels/transition to tall kneeling x10 reps with arms across chest   Moderate difficulty requiring mod A for neutral weight shift with decreased gluteal activation and heavy trunk lean compensation;   Instructed patient in transition to 1/2 kneeling:  Patient required increased time/effort to transition to 1/2 kneel; She required min A to adjust to proper positioning, often collapsing LE inward requiring tactile/verbal cues to keep knee oriented in neutral for better hip stretch/gluteal activation; Educated patient on importance of positioning for optimal muscle activation. She also required cues to take her time and continuing trying to get in proper position to avoid relying on caregiver;   Seated on mat: Butterfly stretch with cues for forward weight shift for increased stretch;   1/2 sit hamstring stretch with cues for proper positioning;  Educated patient in ways to increase independence with HEP including to create a playlist and exercise for 20 min , 2x a day to increase adherence;    Tolerated well. Does report fatigue at end of session;   PATIENT EDUCATION: Education details: positioning/exercise technique Person educated: Patient and Caregiver Education method: Explanation, Demonstration, and Verbal cues Education comprehension: verbalized understanding, returned demonstration, verbal cues required, and needs further education   HOME EXERCISE PROGRAM: Continue as given;   Advanced 04/25/22 Access Code: [email or web address] URL: [email or web address] Date: 04/25/2022 Prepared by: Blanche East  Exercises - Half Kneeling Hip Flexor Stretch  - 1 x daily - 7 x weekly - 1 sets - 3 reps - 15 sec hold - Seated Great Toe Extension  - 1 x daily - 7 x weekly - 1 sets - 10 reps - Seated Toe Raise  - 1 x daily - 7 x weekly - 1 sets - 10 reps   PT Short Term Goals -      PT SHORT TERM GOAL #1   Title Pt will be indep  with performing HEP 3x/week to improve LE tone and strength for improved safety with at home ambulation and ADL's.    Baseline 3/10: Given LE stretches, 6/16: going to gym 1x a week, doing stretches 1x a week; 9/15: 2x a week going to gym; 4/24: getting massage/stretching 2x a week; hasn't been going to gym during ramadan. 5/22: doing them daily in last week ;    Time 4    Period Weeks    Status Achieved    Target Date 11/05/21              PT Long Term Goals       PT LONG TERM GOAL #1   Title Patient will increase Berg Balance score by > 6 points to demonstrate decreased fall risk during functional activities.    Baseline 12/28/20: 32/56 6/16:33/56, 9/15: 35/56, 12/19: deferred, 4/24: 33/56, 5/22: 35/56 7/18: deferred due to time;    Time 12    Period Weeks    Status  Partially Met    Target Date 07/30/22     PT LONG TERM GOAL #2   Title Pt will reduce 5xsSTS time to < 8 sec to demonstrate clinically significant improvement in LE strength and balance to reduce risk of falls.    Baseline 3/10: 10.33 sec no UE support, 6/16: 6 sec with arms across chest (partial ROM), but requires 12 sec for full sit to stand; 9/15: 9.10 sec with arms across chest; 12/19: 11 sec with arms across chest, 4/24: 9.92 sec with arms across chest, 5/22: 13.64 with arms across chest, better safety 7/18: 8.2 sec with arms across chest   Time 12    Period Weeks    Status Partially Met    Target Date 07/30/22     PT LONG TERM GOAL #3   Title Pt will improve 6 MWT to > 1000' with no AD and no LOB to demonstrate ability to improve safety with community distances.    Baseline 3/10: 510', 6/16: 920 feet with CGA, 9/15: 850 feet, 12/19: deferred; 4/24: 950 feet with min A, 5/22: deferred due to time; 05/07/22: deferred due to time;    Time 12    Period Weeks    Status Partially Met    Target Date 07/30/22     PT LONG TERM GOAL #4   Title Pt will improve 10 m gait speed with no AD > 1.0 m/s to demonstrate reduced  risk of falls along with safe ability to complete community walking tasks.    Baseline 3/10: 0.78 m/s, 6/16: 0.84 m/s, 9/15: 0.99 m/s, 12/19: 0.5 m/s, 4/24: deferred; 5/22: 0.9 m/s , 7/18: 0.86 m/s   Time 12    Period Weeks    Status Partially Met    Target Date 07/30/22     PT LONG TERM GOAL #5   Title Pt will improve FOTO score to 64 demonstrate perceived improvements in ability to complete ADL's.    Baseline 3/10: Risk adjusted 51; 04/19/21: 59, 9/15: 63%, 12/19: deferred; 4/24: 58% 05/07/22: 78%   Time 12    Period Weeks    Status Achieved;    Target Date 07/30/22             Plan - 04/15/22 1232     Clinical Impression Statement Patient motivated and participated well within session. She was instructed in advanced LE strengthening. Patient re-educated in exercise she can do at home to increase independence. She continues to be limited with self initiation of exercise and requires assistance from her caregiver. Caregiver educated in ways to encourage patient to increase independence at home. Patient was able to safely get down/up from floor. She was encouraged to increase independence and adherence with exercise in various ways including to set an alert on her phone/use a playlist for encouragement, etc. Patient would benefit from additional skilled PT Intervention to improve strength, balance and mobility    Personal Factors and Comorbidities Comorbidity 2;Fitness;Past/Current Experience;Time since onset of injury/illness/exacerbation;Age    Comorbidities CP and anxiety;    Examination-Activity Limitations Bed Mobility    Examination-Participation Restrictions Church;Cleaning;Community Activity;Driving;Laundry;Medication Management;Meal Prep;Shop;Volunteer;Yard Work    Stability/Clinical Decision Making Unstable/Unpredictable    Rehab Potential Fair    PT Frequency 1x / week    PT Duration 12 weeks    PT Treatment/Interventions Patient/family education;Manual  techniques;Therapeutic activities;Therapeutic exercise;Electrical Stimulation;Aquatic Therapy;DME Instruction;Functional mobility training;Gait training;Neuromuscular re-education;ADLs/Self Care Home Management;Cryotherapy;Moist Heat;Stair training;Orthotic Fit/Training;Energy conservation    Consulted and Agree with Plan of Care Patient;Family member/caregiver  Family Member Consulted mom               Constantinos Krempasky, PT, DPT 05/14/2022, 8:44 AM

## 2022-05-20 ENCOUNTER — Ambulatory Visit: Payer: Medicaid Other | Admitting: Physical Therapy

## 2022-05-21 ENCOUNTER — Ambulatory Visit: Payer: Medicaid Other | Admitting: Physical Therapy

## 2022-05-22 ENCOUNTER — Encounter: Payer: Self-pay | Admitting: Physical Therapy

## 2022-05-27 ENCOUNTER — Ambulatory Visit: Payer: Medicaid Other | Attending: Internal Medicine

## 2022-05-27 DIAGNOSIS — M6281 Muscle weakness (generalized): Secondary | ICD-10-CM | POA: Insufficient documentation

## 2022-05-27 DIAGNOSIS — R278 Other lack of coordination: Secondary | ICD-10-CM | POA: Diagnosis present

## 2022-05-27 DIAGNOSIS — R262 Difficulty in walking, not elsewhere classified: Secondary | ICD-10-CM | POA: Insufficient documentation

## 2022-05-27 DIAGNOSIS — G809 Cerebral palsy, unspecified: Secondary | ICD-10-CM | POA: Diagnosis present

## 2022-05-27 DIAGNOSIS — R2681 Unsteadiness on feet: Secondary | ICD-10-CM | POA: Insufficient documentation

## 2022-05-27 DIAGNOSIS — R2689 Other abnormalities of gait and mobility: Secondary | ICD-10-CM | POA: Diagnosis present

## 2022-05-27 NOTE — Therapy (Signed)
OUTPATIENT PHYSICAL THERAPY TREATMENT NOTE   Patient Name: Renee Walton MRN: [id number] DOB:[birth year], 26 y.o., female Today's Date: 05/27/2022  PCP: Lamonte Sakai, MD REFERRING PROVIDER: Lamonte Sakai, MD   PT End of Session - 05/27/22 1338     Visit Number 38    Number of Visits 64    Date for PT Re-Evaluation 07/30/22    Authorization Type Medicaid (27 annual visit PT/OT): cert period 10/26/24-9/48/54    Authorization Time Period 02/25/22-06/02/22 for 12 visits    Authorization - Visit Number 5    Authorization - Number of Visits 12    Progress Note Due on Visit 40    PT Start Time 1146    PT Stop Time 1229    PT Time Calculation (min) 43 min    Equipment Utilized During Treatment Gait belt    Activity Tolerance Patient tolerated treatment well;No increased pain;Patient limited by fatigue    Behavior During Therapy [facility] for tasks assessed/performed             Past Medical History:  Diagnosis Date   Breakthrough bleeding    Cerebral palsy ([place])    GAD (generalized anxiety disorder)    Migraine headache    Past Surgical History:  Procedure Laterality Date   LEG SURGERY     bilateral x2 in 2008   Patient Active Problem List   Diagnosis Date Noted   Loss of weight 07/17/2015   Depression 07/17/2015   Cerebral palsy ([place]) 07/17/2015    REFERRING DIAG: Spastic Cerebral Palsy  THERAPY DIAG:  Unsteadiness on feet  Other abnormalities of gait and mobility  Muscle weakness (generalized)  Other lack of coordination  Cerebral palsy, unspecified type ([place])  Rationale for Evaluation and Treatment Rehabilitation  PERTINENT HISTORY: Pt returns to therapy after previous bout of CP. Pt states her major goal with therapy is to improve her mobility as much as possible before returning to [place] this upcoming September. Pt states [place] is not as ADA compliant for her as the [place] and wants to improve her walking and balance to feel more confident because she can  not bring her walker on the airplane. Pt reports meeting with MD about possible foot alignment surgery on April 19th and her MD in [place] wants to perform a hip adductor release. 2 falls have occurred in last 6 months due to balance loss but no injury and able to stand indep with use of UE's to helpherself up. Pt does not specify which direction her LOB occurs. Pt uses gym 2x/week for stretching and leg exercises and is motivated to improve her mobility.  PRECAUTIONS: Fall Risk  SUBJECTIVE: Pt denies any falls from last session. Reports waxing and waning difficulty with foot clearance when ambulating. Has been worse than usual lately. Requesting if possible to work with same therapist for consistency in care.   PAIN:  Are you having pain? Yes: NPRS scale: 2/10 Pain location: LLE Pain description: sharp/burning Aggravating factors: standing up after going to the bathroom Relieving factors: unsure     TODAY'S TREATMENT:    There.ex:  PT performed PROM to help alleviate tightness/stiffness and reduce spasticity/tone and alleviate LLE discomfort;    Patient hooklying: Single knee to chest 30 sec hold x2 rep each LE  Hamstring stretch with ankle DF 30 sec hold x2 reps; to increase flexibility with therapist providing stabilization to contralateral limb to avoid tethering;   Figure 4 stretch: 2x30 sec/LE   Hip adductor stretch with knee extended: 2x30  sec/LE    Hook lying YTB hip abd/ER: 2x12 reps. Min VC's and minA on RLE as pt has increased difficulty on RLE versus LLE  // bars:   Ambulation forwards in // bars with use of tape on floor for wider BOS to prevent scissoring gait and mirror for visual feedback. X4 laps, SBA   Alternating foot taps for hip flexion and Bue support, SBA    1/2 bolster: x12/LE    4" step: 2x12/LE    Ambulating alternating foot taps on cones for gait mechanics and foot clearance: x4 laps, SBA   PATIENT EDUCATION: Education details:  Psychologist, occupational Person educated: Patient and Armed forces training and education officer method: Explanation, Media planner, and Verbal cues Education comprehension: verbalized understanding, returned demonstration, verbal cues required, and needs further education   HOME EXERCISE PROGRAM: Continue as given;   Advanced 04/25/22 Access Code: [email or web address] URL: [email or web address] Date: 04/25/2022 Prepared by: Blanche East  Exercises - Half Kneeling Hip Flexor Stretch  - 1 x daily - 7 x weekly - 1 sets - 3 reps - 15 sec hold - Seated Great Toe Extension  - 1 x daily - 7 x weekly - 1 sets - 10 reps - Seated Toe Raise  - 1 x daily - 7 x weekly - 1 sets - 10 reps   PT Short Term Goals -      PT SHORT TERM GOAL #1   Title Pt will be indep with performing HEP 3x/week to improve LE tone and strength for improved safety with at home ambulation and ADL's.    Baseline 3/10: Given LE stretches, 6/16: going to gym 1x a week, doing stretches 1x a week; 9/15: 2x a week going to gym; 4/24: getting massage/stretching 2x a week; hasn't been going to gym during ramadan. 5/22: doing them daily in last week ;    Time 4    Period Weeks    Status Achieved    Target Date 11/05/21              PT Long Term Goals       PT LONG TERM GOAL #1   Title Patient will increase Berg Balance score by > 6 points to demonstrate decreased fall risk during functional activities.    Baseline 12/28/20: 32/56 6/16:33/56, 9/15: 35/56, 12/19: deferred, 4/24: 33/56, 5/22: 35/56 7/18: deferred due to time;    Time 12    Period Weeks    Status Partially Met    Target Date 07/30/22     PT LONG TERM GOAL #2   Title Pt will reduce 5xsSTS time to < 8 sec to demonstrate clinically significant improvement in LE strength and balance to reduce risk of falls.    Baseline 3/10: 10.33 sec no UE support, 6/16: 6 sec with arms across chest (partial ROM), but requires 12 sec for full sit to stand; 9/15: 9.10 sec with arms  across chest; 12/19: 11 sec with arms across chest, 4/24: 9.92 sec with arms across chest, 5/22: 13.64 with arms across chest, better safety 7/18: 8.2 sec with arms across chest   Time 12    Period Weeks    Status Partially Met    Target Date 07/30/22     PT LONG TERM GOAL #3   Title Pt will improve 6 MWT to > 1000' with no AD and no LOB to demonstrate ability to improve safety with community distances.    Baseline 3/10: 510', 6/16: 920 feet with CGA, 9/15: 850 feet, 12/19: deferred; 4/24: 950  feet with min A, 5/22: deferred due to time; 05/07/22: deferred due to time;    Time 12    Period Weeks    Status Partially Met    Target Date 07/30/22     PT LONG TERM GOAL #4   Title Pt will improve 10 m gait speed with no AD > 1.0 m/s to demonstrate reduced risk of falls along with safe ability to complete community walking tasks.    Baseline 3/10: 0.78 m/s, 6/16: 0.84 m/s, 9/15: 0.99 m/s, 12/19: 0.5 m/s, 4/24: deferred; 5/22: 0.9 m/s , 7/18: 0.86 m/s   Time 12    Period Weeks    Status Partially Met    Target Date 07/30/22     PT LONG TERM GOAL #5   Title Pt will improve FOTO score to 64 demonstrate perceived improvements in ability to complete ADL's.    Baseline 3/10: Risk adjusted 51; 04/19/21: 59, 9/15: 63%, 12/19: deferred; 4/24: 58% 05/07/22: 78%   Time 12    Period Weeks    Status Achieved;    Target Date 07/30/22             Plan - 04/15/22 1232     Clinical Impression Statement Continuing PT POC with focus on LE mobility and strengthening for improved safety with gait. Pt does demonstrate improved gait mechanics post mobility and therex exercises with improved foot clearance and gait speed in clinic. Does however still demonstrate significant need for compensations due to weakness and CP related spasticity.  Pt will continue to benefit from skilled PT services to progress strength and mobility to pt tolerance to optimize independence with gait and reduced risk of falls.      Personal Factors and Comorbidities Comorbidity 2;Fitness;Past/Current Experience;Time since onset of injury/illness/exacerbation;Age    Comorbidities CP and anxiety;    Examination-Activity Limitations Bed Mobility    Examination-Participation Restrictions Church;Cleaning;Community Activity;Driving;Laundry;Medication Management;Meal Prep;Shop;Volunteer;Yard Work    Stability/Clinical Decision Making Unstable/Unpredictable    Rehab Potential Fair    PT Frequency 1x / week    PT Duration 12 weeks    PT Treatment/Interventions Patient/family education;Manual techniques;Therapeutic activities;Therapeutic exercise;Electrical Stimulation;Aquatic Therapy;DME Instruction;Functional mobility training;Gait training;Neuromuscular re-education;ADLs/Self Care Home Management;Cryotherapy;Moist Heat;Stair training;Orthotic Fit/Training;Energy conservation    Consulted and Agree with Plan of Care Patient;Family member/caregiver    Family Member Consulted mom             Salem Caster. Fairly IV, PT, DPT Physical Therapist- [facility]  05/27/2022, 1:50 PM

## 2022-05-30 ENCOUNTER — Ambulatory Visit: Payer: Medicaid Other | Admitting: Physical Therapy

## 2022-06-03 ENCOUNTER — Ambulatory Visit: Payer: Medicaid Other

## 2022-06-03 DIAGNOSIS — M6281 Muscle weakness (generalized): Secondary | ICD-10-CM

## 2022-06-03 DIAGNOSIS — R2689 Other abnormalities of gait and mobility: Secondary | ICD-10-CM

## 2022-06-03 DIAGNOSIS — R262 Difficulty in walking, not elsewhere classified: Secondary | ICD-10-CM

## 2022-06-03 DIAGNOSIS — R2681 Unsteadiness on feet: Secondary | ICD-10-CM | POA: Diagnosis not present

## 2022-06-03 NOTE — Therapy (Signed)
OUTPATIENT PHYSICAL THERAPY TREATMENT NOTE   Patient Name: Renee Walton MRN: [id number] DOB:Nov 05, 1995, 26 y.o., female Today's Date: 06/03/2022  PCP: Lamonte Sakai, MD REFERRING PROVIDER: Lamonte Sakai, MD   PT End of Session - 06/03/22 1529     Visit Number 39    Number of Visits 32    Date for PT Re-Evaluation 07/30/22    Authorization Type Medicaid (27 annual visit PT/OT): cert period 01/03/39-0/86/76    Authorization Time Period 02/25/22-06/02/22 for 12 visits    Authorization - Visit Number 5    Authorization - Number of Visits 12    Progress Note Due on Visit 40    PT Start Time 1145    PT Stop Time 1229    PT Time Calculation (min) 44 min    Equipment Utilized During Treatment Gait belt    Activity Tolerance Patient tolerated treatment well;No increased pain;Patient limited by fatigue    Behavior During Therapy [facility] for tasks assessed/performed              Past Medical History:  Diagnosis Date   Breakthrough bleeding    Cerebral palsy ([place])    GAD (generalized anxiety disorder)    Migraine headache    Past Surgical History:  Procedure Laterality Date   LEG SURGERY     bilateral x2 in 2008   Patient Active Problem List   Diagnosis Date Noted   Loss of weight 07/17/2015   Depression 07/17/2015   Cerebral palsy ([place]) 07/17/2015    REFERRING DIAG: Spastic Cerebral Palsy  THERAPY DIAG:  Difficulty in walking, not elsewhere classified  Muscle weakness (generalized)  Unsteadiness on feet  Other abnormalities of gait and mobility  Rationale for Evaluation and Treatment Rehabilitation  PERTINENT HISTORY: Pt returns to therapy after previous bout of CP. Pt states her major goal with therapy is to improve her mobility as much as possible before returning to [place] this upcoming September. Pt states [place] is not as ADA compliant for her as the [place] and wants to improve her walking and balance to feel more confident because she can not bring her  walker on the airplane. Pt reports meeting with MD about possible foot alignment surgery on April 19th and her MD in [place] wants to perform a hip adductor release. 2 falls have occurred in last 6 months due to balance loss but no injury and able to stand indep with use of UE's to helpherself up. Pt does not specify which direction her LOB occurs. Pt uses gym 2x/week for stretching and leg exercises and is motivated to improve her mobility.  PRECAUTIONS: Fall Risk  SUBJECTIVE: Pt reports that her ticket to [place] has not been confirmed and just found that out during PT session, so unsure if she will be going to [place] or not.    PAIN:  Are you having pain? Yes: NPRS scale: 2/10 Pain location: LLE Pain description: sharp/burning Aggravating factors: standing up after going to the bathroom Relieving factors: unsure     TODAY'S TREATMENT:    There.ex:  PT performed PROM to help alleviate tightness/stiffness and reduce spasticity/tone and alleviate LLE discomfort;    Patient hooklying:  Single knee to chest 30 sec hold x2 rep each LE  Hamstring stretch with ankle DF 30 sec hold x2 reps; to increase flexibility with therapist providing stabilization to contralateral limb to avoid tethering;   Figure 4 stretch: 2x30 sec/LE   Hip adductor stretch with knee extended: 2x30 sec/LE   // bars:  Ambulation forwards in // bars with use of tape on floor for wider BOS to prevent scissoring gait and mirror for visual feedback. X4 laps, SBA  Ambulation circuit around the gym x6 total with CGA  Standing alternating foot taps for hip flexion and unilateral UE support, HHA/CGA      4" step: x10 each side HHA   Seated:  Hip abduction into YTB, 2x15 reps.  Min VC's and minA on RLE as pt has increased difficulty on RLE versus LLE  Hip adduction into yellow physioblall, 2x15 reps.  Min VC's and minA on RLE as pt has increased difficulty on RLE versus LLE   PATIENT  EDUCATION: Education details: Psychologist, occupational Person educated: Patient and Caregiver Education method: Explanation, Demonstration, and Verbal cues Education comprehension: verbalized understanding, returned demonstration, verbal cues required, and needs further education   HOME EXERCISE PROGRAM: Continue as given;   Advanced 04/25/22 Access Code: [email or web address] URL: [email or web address] Date: 04/25/2022 Prepared by: Blanche East  Exercises - Half Kneeling Hip Flexor Stretch  - 1 x daily - 7 x weekly - 1 sets - 3 reps - 15 sec hold - Seated Great Toe Extension  - 1 x daily - 7 x weekly - 1 sets - 10 reps - Seated Toe Raise  - 1 x daily - 7 x weekly - 1 sets - 10 reps   PT Short Term Goals -      PT SHORT TERM GOAL #1   Title Pt will be indep with performing HEP 3x/week to improve LE tone and strength for improved safety with at home ambulation and ADL's.    Baseline 3/10: Given LE stretches, 6/16: going to gym 1x a week, doing stretches 1x a week; 9/15: 2x a week going to gym; 4/24: getting massage/stretching 2x a week; hasn't been going to gym during ramadan. 5/22: doing them daily in last week ;    Time 4    Period Weeks    Status Achieved    Target Date 11/05/21              PT Long Term Goals       PT LONG TERM GOAL #1   Title Patient will increase Berg Balance score by > 6 points to demonstrate decreased fall risk during functional activities.    Baseline 12/28/20: 32/56 6/16:33/56, 9/15: 35/56, 12/19: deferred, 4/24: 33/56, 5/22: 35/56 7/18: deferred due to time;    Time 12    Period Weeks    Status Partially Met    Target Date 07/30/22     PT LONG TERM GOAL #2   Title Pt will reduce 5xsSTS time to < 8 sec to demonstrate clinically significant improvement in LE strength and balance to reduce risk of falls.    Baseline 3/10: 10.33 sec no UE support, 6/16: 6 sec with arms across chest (partial ROM), but requires 12 sec for full sit to  stand; 9/15: 9.10 sec with arms across chest; 12/19: 11 sec with arms across chest, 4/24: 9.92 sec with arms across chest, 5/22: 13.64 with arms across chest, better safety 7/18: 8.2 sec with arms across chest   Time 12    Period Weeks    Status Partially Met    Target Date 07/30/22     PT LONG TERM GOAL #3   Title Pt will improve 6 MWT to > 1000' with no AD and no LOB to demonstrate ability to improve safety with community distances.    Baseline 3/10: 510', 6/16:  920 feet with CGA, 9/15: 850 feet, 12/19: deferred; 4/24: 950 feet with min A, 5/22: deferred due to time; 05/07/22: deferred due to time;    Time 12    Period Weeks    Status Partially Met    Target Date 07/30/22     PT LONG TERM GOAL #4   Title Pt will improve 10 m gait speed with no AD > 1.0 m/s to demonstrate reduced risk of falls along with safe ability to complete community walking tasks.    Baseline 3/10: 0.78 m/s, 6/16: 0.84 m/s, 9/15: 0.99 m/s, 12/19: 0.5 m/s, 4/24: deferred; 5/22: 0.9 m/s , 7/18: 0.86 m/s   Time 12    Period Weeks    Status Partially Met    Target Date 07/30/22     PT LONG TERM GOAL #5   Title Pt will improve FOTO score to 64 demonstrate perceived improvements in ability to complete ADL's.    Baseline 3/10: Risk adjusted 51; 04/19/21: 59, 9/15: 63%, 12/19: deferred; 4/24: 58% 05/07/22: 78%   Time 12    Period Weeks    Status Achieved;    Target Date 07/30/22             Plan - 04/15/22 1232     Clinical Impression Statement Pt continues to progress well with therapy and was able to ambulate longer distance today with one seated rest break.  Pt also noted to require less assistance with mobility and only CGA was utilized.  Pt able to keep BOS much wider today with ambulation and only had one instance of heels touching when ambulating.  Pt does have a significant amount of trunk leaning in order to clear LE's, but pt able to manage fairly well.  Will continue to progress with gait and  strengthening of the LE's in order to promote greater independence.       Personal Factors and Comorbidities Comorbidity 2;Fitness;Past/Current Experience;Time since onset of injury/illness/exacerbation;Age    Comorbidities CP and anxiety;    Examination-Activity Limitations Bed Mobility    Examination-Participation Restrictions Church;Cleaning;Community Activity;Driving;Laundry;Medication Management;Meal Prep;Shop;Volunteer;Yard Work    Stability/Clinical Decision Making Unstable/Unpredictable    Rehab Potential Fair    PT Frequency 1x / week    PT Duration 12 weeks    PT Treatment/Interventions Patient/family education;Manual techniques;Therapeutic activities;Therapeutic exercise;Electrical Stimulation;Aquatic Therapy;DME Instruction;Functional mobility training;Gait training;Neuromuscular re-education;ADLs/Self Care Home Management;Cryotherapy;Moist Heat;Stair training;Orthotic Fit/Training;Energy conservation    Consulted and Agree with Plan of Care Patient;Family member/caregiver    Family Member Consulted mom             Gwenlyn Saran, PT, DPT Physical Therapist- [facility]  06/03/2022, 3:58 PM

## 2022-06-10 ENCOUNTER — Ambulatory Visit: Payer: Medicaid Other

## 2022-06-10 DIAGNOSIS — G809 Cerebral palsy, unspecified: Secondary | ICD-10-CM

## 2022-06-10 DIAGNOSIS — R2681 Unsteadiness on feet: Secondary | ICD-10-CM | POA: Diagnosis not present

## 2022-06-10 DIAGNOSIS — M6281 Muscle weakness (generalized): Secondary | ICD-10-CM

## 2022-06-10 DIAGNOSIS — R278 Other lack of coordination: Secondary | ICD-10-CM

## 2022-06-10 NOTE — Therapy (Signed)
OUTPATIENT PHYSICAL THERAPY TREATMENT NOTE/Physical Therapy Progress Note Visit 40  Dates of reporting period  03/11/2022   to   06/10/2022    Patient Name: Renee Walton MRN: [id number] DOB:February 10, 1996, 26 y.o., female Today's Date: 06/10/2022  PCP: Lamonte Sakai, MD REFERRING PROVIDER: Lamonte Sakai, MD   PT End of Session - 06/10/22 1632     Visit Number 40    Number of Visits 71    Date for PT Re-Evaluation 07/30/22    Authorization Type Medicaid (27 annual visit PT/OT): cert period 9/70/26-3/78/58    Authorization Time Period 02/25/22-06/02/22 for 12 visits    Authorization - Visit Number 5    Authorization - Number of Visits 12    Progress Note Due on Visit 40    PT Start Time 1151    PT Stop Time 1233    PT Time Calculation (min) 42 min    Equipment Utilized During Treatment Gait belt    Activity Tolerance Patient tolerated treatment well;No increased pain    Behavior During Therapy WFL for tasks assessed/performed               Past Medical History:  Diagnosis Date   Breakthrough bleeding    Cerebral palsy (HCC)    GAD (generalized anxiety disorder)    Migraine headache    Past Surgical History:  Procedure Laterality Date   LEG SURGERY     bilateral x2 in 2008   Patient Active Problem List   Diagnosis Date Noted   Loss of weight 07/17/2015   Depression 07/17/2015   Cerebral palsy ([place]) 07/17/2015    REFERRING DIAG: Spastic Cerebral Palsy  THERAPY DIAG:  Unsteadiness on feet  Other lack of coordination  Muscle weakness (generalized)  Cerebral palsy, unspecified type ([place])  Rationale for Evaluation and Treatment Rehabilitation  PERTINENT HISTORY: Pt returns to therapy after previous bout of CP. Pt states her major goal with therapy is to improve her mobility as much as possible before returning to [place] this upcoming September. Pt states [place] is not as ADA compliant for her as the [place] and wants to improve her walking and balance to  feel more confident because she can not bring her walker on the airplane. Pt reports meeting with MD about possible foot alignment surgery on April 19th and her MD in [place] wants to perform a hip adductor release. 2 falls have occurred in last 6 months due to balance loss but no injury and able to stand indep with use of UE's to helpherself up. Pt does not specify which direction her LOB occurs. Pt uses gym 2x/week for stretching and leg exercises and is motivated to improve her mobility.  PRECAUTIONS: Fall Risk  SUBJECTIVE: Pt leaving for [place] in September and has one more appt prior to leaving. Pt with L knee pain and R hip pain currently, does not rate. Pt says she is walking 3-4 hours per day. She is not using an AD to ambulate in her home but does report she must hold onto things to regain her balance. She is using her RW outside her home. Pt reports no falls.  PAIN:  Are you having pain? Yes: NPRS scale: not rated/10 Pain location: L knee, R hip Pain description:   Aggravating factors:   Relieving factors:       TODAY'S TREATMENT:    There.ex:  Goals reassessed for progress note. PT instructed pt in technique with goal testing and indications of performance throughout. Please refer to goal section  for details.   Required up to mod a due to lateral LOB in standing with initial testing, able to correct with PT assist and utilized RW for remainder of appointment. Pt reports she thinks her balance is being affected by anxiety currently due to working with a new PT.  NMR: Additional interventions provided this session- With RW, gait belt donned and CGA-min assist: Amb over 10 meter to focus on LE positioning/sequencing - cuing throughout for increase step-width to decrease instability and scissoring gait 5x.  Progressed to attempting intervention with addition of half-foam to step over. with one step. Pt difficulty clearing step.    PATIENT EDUCATION: Education details: goals,  plan, exercise technique, body mechanics Person educated: Patient and Caregiver (pt mother present at session) Education method: Explanation, Demonstration, and Verbal cues Education comprehension: verbalized understanding, returned demonstration, verbal cues required, and needs further education   HOME EXERCISE PROGRAM: Continue as given;   Advanced 04/25/22 Access Code: [email or web address] URL: [email or web address] Date: 04/25/2022 Prepared by: Blanche East  Exercises - Half Kneeling Hip Flexor Stretch  - 1 x daily - 7 x weekly - 1 sets - 3 reps - 15 sec hold - Seated Great Toe Extension  - 1 x daily - 7 x weekly - 1 sets - 10 reps - Seated Toe Raise  - 1 x daily - 7 x weekly - 1 sets - 10 reps   PT Short Term Goals -      PT SHORT TERM GOAL #1   Title Pt will be indep with performing HEP 3x/week to improve LE tone and strength for improved safety with at home ambulation and ADL's.    Baseline 3/10: Given LE stretches, 6/16: going to gym 1x a week, doing stretches 1x a week; 9/15: 2x a week going to gym; 4/24: getting massage/stretching 2x a week; hasn't been going to gym during ramadan. 5/22: doing them daily in last week ;    Time 4    Period Weeks    Status Achieved    Target Date 11/05/21              PT Long Term Goals       PT LONG TERM GOAL #1   Title Patient will increase Berg Balance score by > 6 points to demonstrate decreased fall risk during functional activities.    Baseline 12/28/20: 32/56 6/16:33/56, 9/15: 35/56, 12/19: deferred, 4/24: 33/56, 5/22: 35/56 7/18: deferred due to time; 8/21: deferred to next session due to time   Time 12    Period Weeks    Status Partially Met    Target Date 07/30/22     PT LONG TERM GOAL #2   Title Pt will reduce 5xsSTS time to < 8 sec to demonstrate clinically significant improvement in LE strength and balance to reduce risk of falls.    Baseline 3/10: 10.33 sec no UE support, 6/16: 6 sec with arms across chest  (partial ROM), but requires 12 sec for full sit to stand; 9/15: 9.10 sec with arms across chest; 12/19: 11 sec with arms across chest, 4/24: 9.92 sec with arms across chest, 5/22: 13.64 with arms across chest, better safety 7/18: 8.2 sec with arms across chest; 8/21: 12.76 seconds hands-free   Time 12    Period Weeks    Status Partially Met    Target Date 07/30/22     PT LONG TERM GOAL #3   Title Pt will improve 6 MWT to > 1000' with no AD and no  LOB to demonstrate ability to improve safety with community distances.    Baseline 3/10: 510', 6/16: 920 feet with CGA, 9/15: 850 feet, 12/19: deferred; 4/24: 950 feet with min A, 5/22: deferred due to time; 05/07/22: deferred due to time;  8/21: 822 with RW close CGA   Time 12    Period Weeks    Status Partially Met    Target Date 07/30/22     PT LONG TERM GOAL #4   Title Pt will improve 10 m gait speed with no AD > 1.0 m/s to demonstrate reduced risk of falls along with safe ability to complete community walking tasks.    Baseline 3/10: 0.78 m/s, 6/16: 0.84 m/s, 9/15: 0.99 m/s, 12/19: 0.5 m/s, 4/24: deferred; 5/22: 0.9 m/s , 7/18: 0.86 m/s; 8/21: 0.7 m/s   Time 12    Period Weeks    Status Partially Met    Target Date 07/30/22     PT LONG TERM GOAL #5   Title Pt will improve FOTO score to 64 demonstrate perceived improvements in ability to complete ADL's.    Baseline 3/10: Risk adjusted 51; 04/19/21: 59, 9/15: 63%, 12/19: deferred; 4/24: 58% 05/07/22: 78%; 8/21: 81%   Time 12    Period Weeks    Status Achieved;    Target Date 07/30/22             Plan - 04/15/22 1232     Clinical Impression Statement Goals reassessed for progress note. Pt with decreased scores/performance on 5xSTS, 6MWT and 10MWT, indicating pt still with impairments of BLE strength/power, gait ability, speed, and increased fall risk. Pt reports she thinks her performance is in part being affected by anxiety today due to working with new PT. Pt did show improvement with  FOTO score, however, indicating improved perception of functional mobility and QOL. PT did instruct pt in importance of using an assistive device at home due to unsteadiness and reports that she must hold onto things to maintain her balance. Pt reports intention to continue not using AD in her home, but does understand PT advice. The pt will benefit from further skilled PT to improve gait, strength, balance and mobility to decrease fall risk and increase QOL.      Personal Factors and Comorbidities Comorbidity 2;Fitness;Past/Current Experience;Time since onset of injury/illness/exacerbation;Age    Comorbidities CP and anxiety;    Examination-Activity Limitations Bed Mobility    Examination-Participation Restrictions Church;Cleaning;Community Activity;Driving;Laundry;Medication Management;Meal Prep;Shop;Volunteer;Yard Work    Stability/Clinical Decision Making Unstable/Unpredictable    Rehab Potential Fair    PT Frequency 1x / week    PT Duration 12 weeks    PT Treatment/Interventions Patient/family education;Manual techniques;Therapeutic activities;Therapeutic exercise;Electrical Stimulation;Aquatic Therapy;DME Instruction;Functional mobility training;Gait training;Neuromuscular re-education;ADLs/Self Care Home Management;Cryotherapy;Moist Heat;Stair training;Orthotic Fit/Training;Energy conservation    Consulted and Agree with Plan of Care Patient;Family member/caregiver    Family Member Consulted mom             Ricard Dillon PT, DPT  Physical Therapist- [facility]  06/10/2022, 4:43 PM

## 2022-06-12 ENCOUNTER — Ambulatory Visit: Payer: Medicaid Other | Admitting: Physical Therapy

## 2022-06-17 ENCOUNTER — Ambulatory Visit: Payer: Medicaid Other

## 2022-06-17 DIAGNOSIS — R2681 Unsteadiness on feet: Secondary | ICD-10-CM

## 2022-06-17 DIAGNOSIS — G809 Cerebral palsy, unspecified: Secondary | ICD-10-CM

## 2022-06-17 DIAGNOSIS — R278 Other lack of coordination: Secondary | ICD-10-CM

## 2022-06-17 DIAGNOSIS — M6281 Muscle weakness (generalized): Secondary | ICD-10-CM

## 2022-06-17 NOTE — Therapy (Signed)
OUTPATIENT PHYSICAL THERAPY TREATMENT NOTE    Patient Name: Renee Walton MRN: [id number] DOB:11/09/1995, 26 y.o., female Today's Date: 06/18/2022  PCP: Lamonte Sakai, MD REFERRING PROVIDER: Lamonte Sakai, MD   PT End of Session - 06/18/22 1122     Visit Number 41    Number of Visits 22    Date for PT Re-Evaluation 07/30/22    Authorization Type Medicaid (27 annual visit PT/OT): cert period 02/23/68-7/94/80    Authorization Time Period 02/25/22-06/02/22 for 12 visits    Authorization - Visit Number 5    Authorization - Number of Visits 12    Progress Note Due on Visit 82    PT Start Time 1117    PT Stop Time 1145    PT Time Calculation (min) 28 min    Equipment Utilized During Treatment Gait belt    Activity Tolerance Patient tolerated treatment well;No increased pain    Behavior During Therapy WFL for tasks assessed/performed                Past Medical History:  Diagnosis Date   Breakthrough bleeding    Cerebral palsy (HCC)    GAD (generalized anxiety disorder)    Migraine headache    Past Surgical History:  Procedure Laterality Date   LEG SURGERY     bilateral x2 in 2008   Patient Active Problem List   Diagnosis Date Noted   Loss of weight 07/17/2015   Depression 07/17/2015   Cerebral palsy ([place]) 07/17/2015    REFERRING DIAG: Spastic Cerebral Palsy  THERAPY DIAG:  Other lack of coordination  Unsteadiness on feet  Muscle weakness (generalized)  Cerebral palsy, unspecified type ([place])  Rationale for Evaluation and Treatment Rehabilitation  PERTINENT HISTORY: Pt returns to therapy after previous bout of CP. Pt states her major goal with therapy is to improve her mobility as much as possible before returning to [place] this upcoming September. Pt states [place] is not as ADA compliant for her as the [place] and wants to improve her walking and balance to feel more confident because she can not bring her walker on the airplane. Pt reports meeting with  MD about possible foot alignment surgery on April 19th and her MD in [place] wants to perform a hip adductor release. 2 falls have occurred in last 6 months due to balance loss but no injury and able to stand indep with use of UE's to helpherself up. Pt does not specify which direction her LOB occurs. Pt uses gym 2x/week for stretching and leg exercises and is motivated to improve her mobility.  PRECAUTIONS: Fall Risk  SUBJECTIVE: Pt reports lower L abdominal pain that started this morning. Rates it as 1/10. Reports one fall since she was last here. Pt reports she was walking without an AD, put in her headphones and fell down on the carpet. Reports no injuries. She was able to get up by herself and back to walking. Reports no other updates or concerns.  PAIN:  Are you having pain? Yes: NPRS scale: 1/10 Pain location: lower L abdominal region Pain description:   Aggravating factors:   Relieving factors:       TODAY'S TREATMENT:    NMR:  Berg: 34/56  TherEx: Reviewed HEP with patient to prepare her for [place] trip and absence from PT.  Added additional exercise to HEP (see below) Seated Great Toe Extension performed B with and without AAROM from PT to improve technique with intervention x multiple reps Seated Toe Raise performed B  with and without AAROM from PT to improve technique with intervention x multiple reps Seated Gastroc Stretch with Strap 2x30 sec each LE.  Comments: cuing throughout for technique with interventions   PATIENT EDUCATION: Education details: goals, plan, exercise technique, body mechanics, HEP Person educated: Patient and Caregiver (pt mother present at session) Education method: Explanation, Demonstration, and Verbal cues Education comprehension: verbalized understanding, returned demonstration, verbal cues required, and needs further education   HOME EXERCISE PROGRAM:  Added 8/28: Access Code: [email or web address] URL: [email or web address] Date:  06/17/2022 Prepared by: Ricard Dillon  Exercises - Seated Gastroc Stretch with Strap  - 2 x daily - 7 x weekly - 2 sets - 2 reps - 30 seconds hold  Previous- Continue as given;   Advanced 04/25/22 Access Code: [email or web address] URL: [email or web address] Date: 04/25/2022 Prepared by: Blanche East  Exercises - Half Kneeling Hip Flexor Stretch  - 1 x daily - 7 x weekly - 1 sets - 3 reps - 15 sec hold - Seated Great Toe Extension  - 1 x daily - 7 x weekly - 1 sets - 10 reps - Seated Toe Raise  - 1 x daily - 7 x weekly - 1 sets - 10 reps   PT Short Term Goals -      PT SHORT TERM GOAL #1   Title Pt will be indep with performing HEP 3x/week to improve LE tone and strength for improved safety with at home ambulation and ADL's.    Baseline 3/10: Given LE stretches, 6/16: going to gym 1x a week, doing stretches 1x a week; 9/15: 2x a week going to gym; 4/24: getting massage/stretching 2x a week; hasn't been going to gym during ramadan. 5/22: doing them daily in last week ;    Time 4    Period Weeks    Status Achieved    Target Date 11/05/21              PT Long Term Goals       PT LONG TERM GOAL #1   Title Patient will increase Berg Balance score by > 6 points to demonstrate decreased fall risk during functional activities.    Baseline 12/28/20: 32/56 6/16:33/56, 9/15: 35/56, 12/19: deferred, 4/24: 33/56, 5/22: 35/56 7/18: deferred due to time; 8/21: deferred to next session due to time 8/28: 34/56   Time 12    Period Weeks    Status Partially Met    Target Date 07/30/22     PT LONG TERM GOAL #2   Title Pt will reduce 5xsSTS time to < 8 sec to demonstrate clinically significant improvement in LE strength and balance to reduce risk of falls.    Baseline 3/10: 10.33 sec no UE support, 6/16: 6 sec with arms across chest (partial ROM), but requires 12 sec for full sit to stand; 9/15: 9.10 sec with arms across chest; 12/19: 11 sec with arms across chest, 4/24: 9.92 sec with  arms across chest, 5/22: 13.64 with arms across chest, better safety 7/18: 8.2 sec with arms across chest; 8/21: 12.76 seconds hands-free   Time 12    Period Weeks    Status Partially Met    Target Date 07/30/22     PT LONG TERM GOAL #3   Title Pt will improve 6 MWT to > 1000' with no AD and no LOB to demonstrate ability to improve safety with community distances.    Baseline 3/10: 510', 6/16: 920 feet with CGA, 9/15: 850 feet, 12/19: deferred; 4/24: 950 feet with  min A, 5/22: deferred due to time; 05/07/22: deferred due to time;  8/21: 822 with RW close CGA   Time 12    Period Weeks    Status Partially Met    Target Date 07/30/22     PT LONG TERM GOAL #4   Title Pt will improve 10 m gait speed with no AD > 1.0 m/s to demonstrate reduced risk of falls along with safe ability to complete community walking tasks.    Baseline 3/10: 0.78 m/s, 6/16: 0.84 m/s, 9/15: 0.99 m/s, 12/19: 0.5 m/s, 4/24: deferred; 5/22: 0.9 m/s , 7/18: 0.86 m/s; 8/21: 0.7 m/s   Time 12    Period Weeks    Status Partially Met    Target Date 07/30/22     PT LONG TERM GOAL #5   Title Pt will improve FOTO score to 64 demonstrate perceived improvements in ability to complete ADL's.    Baseline 3/10: Risk adjusted 51; 04/19/21: 59, 9/15: 63%, 12/19: deferred; 4/24: 58% 05/07/22: 78%; 8/21: 81%   Time 12    Period Weeks    Status Achieved;    Target Date 07/30/22             Plan - 04/15/22 1232     Clinical Impression Statement Session limited secondary to pt late arrival. Completed further goal assessment on this date. Pt with slight decrease in score compared to previous testing. Pt reports she still thinks she is being impacted by anxiety due to working with a new PT. PT reviewed HEP and added an exercise so pt can confidently continued exercises while on her trip to [place]. Pt verbalized understanding for all and was able to demonstrate correct technique in session. The pt will benefit from further skilled PT to  improve gait, strength, balance and mobility to decrease fall risk and increase QOL.      Personal Factors and Comorbidities Comorbidity 2;Fitness;Past/Current Experience;Time since onset of injury/illness/exacerbation;Age    Comorbidities CP and anxiety;    Examination-Activity Limitations Bed Mobility    Examination-Participation Restrictions Church;Cleaning;Community Activity;Driving;Laundry;Medication Management;Meal Prep;Shop;Volunteer;Yard Work    Stability/Clinical Decision Making Unstable/Unpredictable    Rehab Potential Fair    PT Frequency 1x / week    PT Duration 12 weeks    PT Treatment/Interventions Patient/family education;Manual techniques;Therapeutic activities;Therapeutic exercise;Electrical Stimulation;Aquatic Therapy;DME Instruction;Functional mobility training;Gait training;Neuromuscular re-education;ADLs/Self Care Home Management;Cryotherapy;Moist Heat;Stair training;Orthotic Fit/Training;Energy conservation    Consulted and Agree with Plan of Care Patient;Family member/caregiver    Family Member Consulted mom             Ricard Dillon PT, DPT  Physical Therapist- [facility]  06/18/2022, 11:24 AM

## 2022-06-20 ENCOUNTER — Ambulatory Visit: Payer: Medicaid Other | Admitting: Physical Therapy

## 2022-06-25 ENCOUNTER — Ambulatory Visit: Payer: Medicaid Other | Admitting: Physical Therapy

## 2022-07-01 ENCOUNTER — Ambulatory Visit: Payer: Medicaid Other

## 2022-07-08 ENCOUNTER — Ambulatory Visit: Payer: Medicaid Other

## 2022-07-15 ENCOUNTER — Ambulatory Visit: Payer: Medicaid Other

## 2022-07-22 ENCOUNTER — Ambulatory Visit: Payer: Medicaid Other

## 2022-07-29 ENCOUNTER — Ambulatory Visit: Payer: Medicaid Other

## 2022-08-05 ENCOUNTER — Ambulatory Visit: Payer: Medicaid Other

## 2022-08-12 ENCOUNTER — Ambulatory Visit: Payer: Medicaid Other

## 2022-08-19 ENCOUNTER — Ambulatory Visit: Payer: Medicaid Other

## 2022-08-26 ENCOUNTER — Ambulatory Visit: Payer: Medicaid Other

## 2022-09-02 ENCOUNTER — Ambulatory Visit: Payer: Medicaid Other

## 2022-09-09 ENCOUNTER — Ambulatory Visit: Payer: Medicaid Other

## 2022-09-16 ENCOUNTER — Ambulatory Visit: Payer: Medicaid Other

## 2022-09-23 ENCOUNTER — Ambulatory Visit: Payer: Medicaid Other

## 2022-09-30 ENCOUNTER — Ambulatory Visit: Payer: Medicaid Other

## 2022-10-07 ENCOUNTER — Ambulatory Visit: Payer: Medicaid Other

## 2022-10-22 ENCOUNTER — Ambulatory Visit: Payer: Medicaid Other | Attending: Internal Medicine

## 2022-10-22 DIAGNOSIS — G809 Cerebral palsy, unspecified: Secondary | ICD-10-CM | POA: Diagnosis present

## 2022-10-22 DIAGNOSIS — R262 Difficulty in walking, not elsewhere classified: Secondary | ICD-10-CM | POA: Diagnosis present

## 2022-10-22 DIAGNOSIS — R2689 Other abnormalities of gait and mobility: Secondary | ICD-10-CM | POA: Insufficient documentation

## 2022-10-22 DIAGNOSIS — R278 Other lack of coordination: Secondary | ICD-10-CM | POA: Diagnosis present

## 2022-10-22 DIAGNOSIS — M6281 Muscle weakness (generalized): Secondary | ICD-10-CM | POA: Insufficient documentation

## 2022-10-22 DIAGNOSIS — R2681 Unsteadiness on feet: Secondary | ICD-10-CM | POA: Diagnosis not present

## 2022-10-22 NOTE — Therapy (Signed)
OUTPATIENT PHYSICAL THERAPY NEURO EVALUATION   Patient Name: Renee Walton MRN: [id number] DOB:[birth year], 27 y.o., female Today's Date: 10/22/2022   PCP: Perrin Maltese, MD REFERRING PROVIDER:   Perrin Maltese, MD    END OF SESSION:  PT End of Session - 10/22/22 1506     Visit Number 1    Number of Visits 25    Date for PT Re-Evaluation 01/14/23    PT Start Time 7829    PT Stop Time 1431    PT Time Calculation (min) 46 min    Equipment Utilized During Treatment Gait belt    Activity Tolerance Patient tolerated treatment well    Behavior During Therapy WFL for tasks assessed/performed             Past Medical History:  Diagnosis Date   Breakthrough bleeding    Cerebral palsy ([place])    GAD (generalized anxiety disorder)    Migraine headache    Past Surgical History:  Procedure Laterality Date   LEG SURGERY     bilateral x2 in 2008   Patient Active Problem List   Diagnosis Date Noted   Loss of weight 07/17/2015   Depression 07/17/2015   Cerebral palsy ([place]) 07/17/2015    ONSET DATE: Oct 29, 1993 pt has CP  REFERRING DIAG:   G80.9 (ICD-10-CM) - Cerebral palsy ([place])    THERAPY DIAG:  Unsteadiness on feet  Other lack of coordination  Difficulty in walking, not elsewhere classified  Muscle weakness (generalized)  Cerebral palsy, unspecified type ([place])  Rationale for Evaluation and Treatment: Rehabilitation  SUBJECTIVE:                                                                                                                                                                                             SUBJECTIVE STATEMENT: Pt returns to PT for evaluation after long trip to [place]. Pt has been walking while outside of PT per report. She says that she sometimes uses her walker and sometimes her mom's hand to ambulate .She has felt very stiff recently in her LEs. Her cousin helped her with PT while away. She feels her balance was good when ambulating outside  of PT but does report 2 recent falls where she didn't injure herself. PT reports some L anterior hip pain and quad pain today. She reports onset yesterday and that the pain can come on at random. She rates it as a 3/10. Pt reports she has been using her walker in her home and would like to become as independent as possible with gait. Pt accompanied by: family member  PERTINENT HISTORY:  Pt is a pleasant 27 y/o female with CP who is returning to PT after absence and trip to [place]. She would like to improve her mobility, gait and balance. Pt has continued her PT exercises while away and worked on gait, and wants to become independent using a RW. She reports 2 recent falls, and no injuries. PMH consists of the following: CP, depression, GAD, migraine headache, hx of LE surgery bilateral x2 in 2008   PAIN:  Are you having pain? Yes: NPRS scale: 3/10 Pain location: superior/anterior LLE/near hip flexors, and throughout L quad  Pain description:   Aggravating factors:   Relieving factors:    PRECAUTIONS: Fall  WEIGHT BEARING RESTRICTIONS: No  FALLS: Has patient fallen in last 6 months? Yes. Number of falls 2 when walking. She reports no injury. She got back up independently  LIVING ENVIRONMENT: via chart, pt confirms no change in living situation since last seen Lives with: lives with their family Lives in: House/apartment - entry level home, two stories Stairs:  13 to second floor Has following equipment at home:  reports she has RW which she prefers to use because this can fold easily and fit into her car  PLOF: Needs assistance with ADLs and Needs assistance with gait  PATIENT GOALS: Pt would like to improve her ability to ambulate with RW both inside and over outside surfaces in order to increase her independence with mobility. She would like to learn how to safely get in and out of a car with her RW.  OBJECTIVE:   DIAGNOSTIC FINDINGS:  No recent pertinent imaging per  chart  COGNITION: Overall cognitive status: Within functional limits for tasks assessed   SENSATION: WNL BLE to light touch  COORDINATION: Impaired in BUE and BLE  EDEMA:  No edema observed or reported  MUSCLE TONE: impaired BLE, spasticity, hx of botox injections in LEs    POSTURE: forward head  LOWER EXTREMITY ROM:    Affected by spasticity   LOWER EXTREMITY MMT:  grossly 4/5 BLE, greatest impairment found in B hip flexors, B hip abd/adductors     TRANSFERS: Assistive device utilized:  hands-free from chair to RW for balance   Sit to stand: Modified independence Stand to sit: Modified independence  STAIRS: Reports able to ascend/descend steps mod I (with UE support)   GAIT: Gait pattern:  Pt exhibits scissoring gait, increased lateral trunk sway bilaterally, increased difficulty with clearing foot/foot flat/drag with fatigue, impaired hip and knee control throughout Distance walked: (see above)/clinic distances Assistive device utilized: Environmental consultant - 2 wheeled Level of assistance: CGA Comments: exhibits scissoring gait (see above), generally impaired  FUNCTIONAL TESTS:  5 times sit to stand: 10 sec to RW 6 minute walk test: 808 ft with RW, CGA 10 meter walk test: 0.77 m/s with RW Berg Balance Scale: deferred  PATIENT SURVEYS:  FOTO risk-adjusted score of 51 (goal score of 76)  TODAY'S TREATMENT:  DATE:   Discussed upright walker vs regular walker as pt goal to be indep with a walker that can fit in her car and to be able to use it outside. Pt reports she at this time thinks she prefers 2WW x 3 min   PATIENT EDUCATION: Education details: goal testing, indications, plan, therapy goals Person educated: Patient and Parent Education method: Explanation, Demonstration, and Verbal cues Education comprehension: verbalized understanding and  returned demonstration  HOME EXERCISE PROGRAM: To be initiated next session (pt for now performing HEP given for previous bout of PT)  GOALS: Goals reviewed with patient? Yes  SHORT TERM GOALS: Target date: 12/03/2022  Patient will be independent in home exercise program to improve strength/mobility for better functional independence with ADLs. Baseline: to be initiated next 1-2 visits Goal status: INITIAL  LONG TERM GOALS: Target date: 01/14/2023  Patient will increase FOTO score to equal to or greater than  76  to demonstrate statistically significant improvement in mobility and quality of life.  Baseline: risk adjusted 51 Goal status: INITIAL  2.  Pt will reduce 5xsSTS time to < 8 sec to demonstrate clinically significant improvement in LE strength and balance to reduce risk of falls.  Baseline: previously 12.76 sec on 06/10/22, now 10 sec to RW on 10/22/2022 Goal status: INITIAL  3.  Pt will improve 6 MWT to > 1000' with no AD and no LOB to demonstrate ability to improve safety with community distances. Baseline: was 822 ft back in August 2023, now 808 with RW, CGA Goal status: INITIAL  4.  Pt will improve 10 m gait speed with LRAD to > 1.0 m/s to demonstrate reduced risk of falls along with safe ability to complete community walking tasks Baseline: previously 0.7 m/s on 06/10/2022, now 0.77 m/s with RW Goal status: INITIAL  5.  Patient will increase Berg Balance score by > 6 points to demonstrate decreased fall risk during functional activities.  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 Pt has been seen in this clinic in the past and is here to resume treatment for impairments. She would like to improve independence,  general mobility and gait. Exam indicates impairments in gait mechanics/ability/speed, strength, balance, coordination and pain. Pt very unsteady with gait and PT continues to recommend pt use a RW to decrease fall risk (pt reports she has at times ambulated without RW, with 2 recent falls). The pt will benefit from further skilled PT to address these deficits in order to decrease fall risk and increase safety and independence with ADLs.  OBJECTIVE IMPAIRMENTS: Abnormal gait, decreased activity tolerance, decreased balance, decreased coordination, decreased endurance, decreased knowledge of use of DME, decreased mobility, difficulty walking, decreased strength, impaired tone, improper body mechanics, postural dysfunction, and pain.   ACTIVITY LIMITATIONS: carrying, lifting, bending, standing, squatting, stairs, transfers, dressing, and locomotion level  PARTICIPATION LIMITATIONS: meal prep, cleaning, shopping, community activity, and yard work  PERSONAL FACTORS: Fitness, Sex, Time since onset of injury/illness/exacerbation, and 3+ comorbidities: PMH consists of the following: CP, depression, GAD, migraine headache, hx of LE surgery bilateral x2 in 2008  are also affecting patient's functional outcome.   REHAB POTENTIAL: Good  CLINICAL DECISION MAKING: Evolving/moderate complexity  EVALUATION COMPLEXITY: Moderate  PLAN:  PT FREQUENCY: 1-2x/week  PT DURATION:  12 weeks  PLANNED INTERVENTIONS: Therapeutic exercises, Therapeutic activity, Neuromuscular re-education, Balance training, Gait training, Patient/Family education, Self Care, Joint mobilization, Stair training, Vestibular training, Canalith repositioning, Visual/preceptual remediation/compensation, Orthotic/Fit training, DME instructions, Dry Needling, Electrical stimulation, Wheelchair mobility training, Spinal mobilization, Cryotherapy, Moist heat, Splintting, Taping, Biofeedback, Manual therapy, and Re-evaluation  PLAN FOR NEXT  SESSION: BERG, initiate HEP, training with walker/transfer training and uneven surfaces   Zollie Pee, PT 10/22/2022, 5:55 PM

## 2022-10-30 ENCOUNTER — Ambulatory Visit: Payer: Medicaid Other

## 2022-10-30 ENCOUNTER — Telehealth: Payer: Self-pay

## 2022-10-30 NOTE — Telephone Encounter (Signed)
PT tried to reach pt via secure HIPAA compliant phone line due to missed visit this morning. PT left VM with two possible available appointment times remaining today with clinic contact information should pt like to still be seen. PT also provided info regarding next appointment date/time.  Ricard Dillon PT, DPT

## 2022-10-30 NOTE — Therapy (Incomplete)
OUTPATIENT PHYSICAL THERAPY NEURO TREATMENT NOTE   Patient Name: Renee Walton MRN: [id number] DOB:1996/08/23, 27 y.o., female Today's Date: 10/22/2022   PCP: [name], MD REFERRING PROVIDER:   [name], MD    END OF SESSION:  PT End of Session - 10/22/22 1506     Visit Number 1    Number of Visits 25    Date for PT Re-Evaluation 01/14/23    PT Start Time 1345    PT Stop Time 1431    PT Time Calculation (min) 46 min    Equipment Utilized During Treatment Gait belt    Activity Tolerance Patient tolerated treatment well    Behavior During Therapy WFL for tasks assessed/performed             Past Medical History:  Diagnosis Date   Breakthrough bleeding    Cerebral palsy ([facility])    GAD (generalized anxiety disorder)    Migraine headache    Past Surgical History:  Procedure Laterality Date   LEG SURGERY     bilateral x2 in 2008   Patient Active Problem List   Diagnosis Date Noted   Loss of weight 07/17/2015   Depression 07/17/2015   Cerebral palsy ([facility]) 07/17/2015    ONSET DATE: 1995-10-24 pt has CP  REFERRING DIAG:   G80.9 (ICD-10-CM) - Cerebral palsy ([facility])    THERAPY DIAG:  Unsteadiness on feet  Other lack of coordination  Difficulty in walking, not elsewhere classified  Muscle weakness (generalized)  Cerebral palsy, unspecified type ([facility])  Rationale for Evaluation and Treatment: Rehabilitation  SUBJECTIVE:                                                                                                                                                                                             SUBJECTIVE STATEMENT: Pt returns to PT for evaluation after long trip to [place]. Pt has been walking while outside of PT per report. She says that she sometimes uses her walker and sometimes her mom's hand to ambulate .She has felt very stiff recently in her LEs. Her cousin helped her with PT while away. She feels her balance was good when ambulating  outside of PT but does report 2 recent falls where she didn't injure herself. PT reports some L anterior hip pain and quad pain today. She reports onset yesterday and that the pain can come on at random. She rates it as a 3/10. Pt reports she has been using her walker in her home and would like to become as independent as possible with gait. Pt accompanied by: family member  PERTINENT HISTORY:  Pt is a pleasant 27 y/o female with CP who is returning to PT after absence and trip to [place]. She would like to improve her mobility, gait and balance. Pt has continued her PT exercises while away and worked on gait, and wants to become independent using a RW. She reports 2 recent falls, and no injuries. PMH consists of the following: CP, depression, GAD, migraine headache, hx of LE surgery bilateral x2 in 2008   PAIN:  Are you having pain? Yes: NPRS scale: 3/10 Pain location: superior/anterior LLE/near hip flexors, and throughout L quad  Pain description:   Aggravating factors:   Relieving factors:    PRECAUTIONS: Fall  WEIGHT BEARING RESTRICTIONS: No  FALLS: Has patient fallen in last 6 months? Yes. Number of falls 2 when walking. She reports no injury. She got back up independently  LIVING ENVIRONMENT: via chart, pt confirms no change in living situation since last seen Lives with: lives with their family Lives in: House/apartment - entry level home, two stories Stairs:  13 to second floor Has following equipment at home:  reports she has RW which she prefers to use because this can fold easily and fit into her car  PLOF: Needs assistance with ADLs and Needs assistance with gait  PATIENT GOALS: Pt would like to improve her ability to ambulate with RW both inside and over outside surfaces in order to increase her independence with mobility. She would like to learn how to safely get in and out of a car with her RW.  OBJECTIVE: taken at eval unless otherwise specified  DIAGNOSTIC FINDINGS:   No recent pertinent imaging per chart  COGNITION: Overall cognitive status: Within functional limits for tasks assessed   SENSATION: WNL BLE to light touch  COORDINATION: Impaired in BUE and BLE  EDEMA:  No edema observed or reported  MUSCLE TONE: impaired BLE, spasticity, hx of botox injections in LEs    POSTURE: forward head  LOWER EXTREMITY ROM:    Affected by spasticity   LOWER EXTREMITY MMT:  grossly 4/5 BLE, greatest impairment found in B hip flexors, B hip abd/adductors     TRANSFERS: Assistive device utilized:  hands-free from chair to RW for balance   Sit to stand: Modified independence Stand to sit: Modified independence  STAIRS: Reports able to ascend/descend steps mod I (with UE support)   GAIT: Gait pattern:  Pt exhibits scissoring gait, increased lateral trunk sway bilaterally, increased difficulty with clearing foot/foot flat/drag with fatigue, impaired hip and knee control throughout Distance walked: 73mwt (see above)/clinic distances Assistive device utilized: Environmental consultant - 2 wheeled Level of assistance: CGA Comments: exhibits scissoring gait (see above), generally impaired  FUNCTIONAL TESTS:  5 times sit to stand: 10 sec to RW 6 minute walk test: 808 ft with RW, CGA 10 meter walk test: 0.77 m/s with RW Berg Balance Scale: deferred  PATIENT SURVEYS:  FOTO risk-adjusted score of 51 (goal score of 76)  TODAY'S TREATMENT:  DATE:   BERG  Nustep  Seated March Seated ER/abd  In // bars: Gait with Static stand   PATIENT EDUCATION: Education details: goal testing, indications, plan, therapy goals Person educated: Patient and Parent Education method: Explanation, Demonstration, and Verbal cues Education comprehension: verbalized understanding and returned demonstration  HOME EXERCISE PROGRAM: To be initiated next  session (pt for now performing HEP given for previous bout of PT)  GOALS: Goals reviewed with patient? Yes  SHORT TERM GOALS: Target date: 12/03/2022  Patient will be independent in home exercise program to improve strength/mobility for better functional independence with ADLs. Baseline: to be initiated next 1-2 visits Goal status: INITIAL  LONG TERM GOALS: Target date: 01/14/2023  Patient will increase FOTO score to equal to or greater than  76  to demonstrate statistically significant improvement in mobility and quality of life.  Baseline: risk adjusted 51 Goal status: INITIAL  2.  Pt will reduce 5xsSTS time to < 8 sec to demonstrate clinically significant improvement in LE strength and balance to reduce risk of falls.  Baseline: previously 12.76 sec on 06/10/22, now 10 sec to RW on 10/22/2022 Goal status: INITIAL  3.  Pt will improve 6 MWT to > 1000' with no AD and no LOB to demonstrate ability to improve safety with community distances. Baseline: was 822 ft back in August 2023, now 808 with RW, CGA Goal status: INITIAL  4.  Pt will improve 10 m gait speed with LRAD to > 1.0 m/s to demonstrate reduced risk of falls along with safe ability to complete community walking tasks Baseline: previously 0.7 m/s on 06/10/2022, now 0.77 m/s with RW Goal status: INITIAL  5.  Patient will increase Berg Balance score by > 6 points to demonstrate decreased fall risk during functional activities.  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 Pt has been seen in this clinic in the past and is here to resume treatment for impairments. She would like to improve independence, general mobility and gait. Exam indicates impairments in gait  mechanics/ability/speed, strength, balance, coordination and pain. Pt very unsteady with gait and PT continues to recommend pt use a RW to decrease fall risk (pt reports she has at times ambulated without RW, with 2 recent falls). The pt will benefit from further skilled PT to address these deficits in order to decrease fall risk and increase safety and independence with ADLs.  OBJECTIVE IMPAIRMENTS: Abnormal gait, decreased activity tolerance, decreased balance, decreased coordination, decreased endurance, decreased knowledge of use of DME, decreased mobility, difficulty walking, decreased strength, impaired tone, improper body mechanics, postural dysfunction, and pain.   ACTIVITY LIMITATIONS: carrying, lifting, bending, standing, squatting, stairs, transfers, dressing, and locomotion level  PARTICIPATION LIMITATIONS: meal prep, cleaning, shopping, community activity, and yard work  PERSONAL FACTORS: Fitness, Sex, Time since onset of injury/illness/exacerbation, and 3+ comorbidities: PMH consists of the following: CP, depression, GAD, migraine headache, hx of LE surgery bilateral x2 in 2008  are also affecting patient's functional outcome.   REHAB POTENTIAL: Good  CLINICAL DECISION MAKING: Evolving/moderate complexity  EVALUATION COMPLEXITY: Moderate  PLAN:  PT FREQUENCY: 1-2x/week  PT DURATION: 12 weeks  PLANNED INTERVENTIONS: Therapeutic exercises, Therapeutic activity, Neuromuscular re-education, Balance training, Gait training, Patient/Family education, Self Care, Joint mobilization, Stair training, Vestibular training, Canalith  repositioning, Visual/preceptual remediation/compensation, Orthotic/Fit training, DME instructions, Dry Needling, Electrical stimulation, Wheelchair mobility training, Spinal mobilization, Cryotherapy, Moist heat, Splintting, Taping, Biofeedback, Manual therapy, and Re-evaluation  PLAN FOR NEXT SESSION: BERG, initiate HEP, training with walker/transfer training  and uneven surfaces   Baird Kay, PT 10/22/2022, 5:55 PM

## 2022-11-05 ENCOUNTER — Ambulatory Visit: Payer: Medicaid Other

## 2022-11-13 ENCOUNTER — Ambulatory Visit: Payer: Medicaid Other

## 2022-11-13 DIAGNOSIS — R2689 Other abnormalities of gait and mobility: Secondary | ICD-10-CM

## 2022-11-13 DIAGNOSIS — R2681 Unsteadiness on feet: Secondary | ICD-10-CM | POA: Diagnosis not present

## 2022-11-13 DIAGNOSIS — R278 Other lack of coordination: Secondary | ICD-10-CM

## 2022-11-13 NOTE — Therapy (Signed)
OUTPATIENT PHYSICAL THERAPY NEURO TREATMENT NOTE   Patient Name: Renee Walton MRN: [id number] DOB:[birth year], 27 y.o., female Today's Date: 11/13/2022   PCP: [name], MD REFERRING PROVIDER:   [name], MD    END OF SESSION:  PT End of Session - 11/13/22 1627     Visit Number 2    Number of Visits 25    Date for PT Re-Evaluation 01/14/23    PT Start Time 1433    PT Stop Time 1516    PT Time Calculation (min) 43 min    Equipment Utilized During Treatment Gait belt    Activity Tolerance Patient tolerated treatment well;No increased pain    Behavior During Therapy WFL for tasks assessed/performed              Past Medical History:  Diagnosis Date   Breakthrough bleeding    Cerebral palsy (HCC)    GAD (generalized anxiety disorder)    Migraine headache    Past Surgical History:  Procedure Laterality Date   LEG SURGERY     bilateral x2 in 2008   Patient Active Problem List   Diagnosis Date Noted   Loss of weight 07/17/2015   Depression 07/17/2015   Cerebral palsy (HCC) 07/17/2015    ONSET DATE: 11-25-1993 pt has CP  REFERRING DIAG:   G80.9 (ICD-10-CM) - Cerebral palsy (HCC)    THERAPY DIAG:  Other lack of coordination  Unsteadiness on feet  Other abnormalities of gait and mobility  Rationale for Evaluation and Treatment: Rehabilitation  SUBJECTIVE:                                                                                                                                                                                             SUBJECTIVE STATEMENT: Pt reports recent botox injections to BLE. She currently reports some LE pain that she thinks may be due to anxiety (developed just prior to appointment). She reports no other updates/concerns.  Pt accompanied by: family member  PERTINENT HISTORY:    Pt is a pleasant 27 y/o female with CP who is returning to PT after absence and trip to [place]. She would like to improve her mobility,  gait and balance. Pt has continued her PT exercises while away and worked on gait, and wants to become independent using a RW. She reports 2 recent falls, and no injuries. Pt also with stiffness in BLEs.  PMH consists of the following: CP, depression, GAD, migraine headache, hx of LE surgery bilateral x2 in 2008   PAIN:  Are you having pain? Yes: NPRS scale: 3/10 Pain location: superior/anterior LLE/near hip flexors,  and throughout L quad  Pain description:   Aggravating factors:   Relieving factors:    PRECAUTIONS: Fall  WEIGHT BEARING RESTRICTIONS: No  FALLS: Has patient fallen in last 6 months? Yes. Number of falls 2 when walking. She reports no injury. She got back up independently  LIVING ENVIRONMENT: via chart, pt confirms no change in living situation since last seen Lives with: lives with their family Lives in: House/apartment - entry level home, two stories Stairs:  13 to second floor Has following equipment at home:  reports she has RW which she prefers to use because this can fold easily and fit into her car  PLOF: Needs assistance with ADLs and Needs assistance with gait  PATIENT GOALS: Pt would like to improve her ability to ambulate with RW both inside and over outside surfaces in order to increase her independence with mobility. She would like to learn how to safely get in and out of a car with her RW.  OBJECTIVE: taken at eval unless otherwise specified  DIAGNOSTIC FINDINGS:  No recent pertinent imaging per chart  COGNITION: Overall cognitive status: Within functional limits for tasks assessed   SENSATION: WNL BLE to light touch  COORDINATION: Impaired in BUE and BLE  EDEMA:  No edema observed or reported  MUSCLE TONE: impaired BLE, spasticity, hx of botox injections in LEs    POSTURE: forward head  LOWER EXTREMITY ROM:    Affected by spasticity   LOWER EXTREMITY MMT:  grossly 4/5 BLE, greatest impairment found in B hip flexors, B hip abd/adductors      TRANSFERS: Assistive device utilized:  hands-free from chair to RW for balance   Sit to stand: Modified independence Stand to sit: Modified independence  STAIRS: Reports able to ascend/descend steps mod I (with UE support)   GAIT: Gait pattern:  Pt exhibits scissoring gait, increased lateral trunk sway bilaterally, increased difficulty with clearing foot/foot flat/drag with fatigue, impaired hip and knee control throughout Distance walked: 63mwt (see above)/clinic distances Assistive device utilized: Environmental consultant - 2 wheeled Level of assistance: CGA Comments: exhibits scissoring gait (see above), generally impaired  FUNCTIONAL TESTS:  5 times sit to stand: 10 sec to RW 6 minute walk test: 808 ft with RW, CGA 10 meter walk test: 0.77 m/s with RW Berg Balance Scale: deferred  PATIENT SURVEYS:  FOTO risk-adjusted score of 51 (goal score of 76)  TODAY'S TREATMENT:                                                                                                                              DATE:   TherAct: PT provides adjustment of RW height and instructs pt in determine appropriate height for RW. Pt then tests new height with ambulating approx 10 meters. Pt cues pt for proximity to RW and upright posture. She is able to correct following cues.  BERG - 36/56  Manual: Pt supine on large mat table: PT provides the following long-duration  stretching: B gastrocsoleus, B hamstrings, B hip adductors, B piriformis.  Additionally PT provided PROM 10-15 reps ankle PF/DF bilat.  TherEx: Instructed pt through knee to chest stretch (pt performs indep) 60 sec each LE    PATIENT EDUCATION: Education details: Pt educated throughout session about proper posture and technique with exercises. Improved exercise technique, movement at target joints, use of target muscles after min to mod verbal, visual, tactile cues.  Person educated: Patient and Parent Education method: Explanation,  Demonstration, and Verbal cues Education comprehension: verbalized understanding and returned demonstration  HOME EXERCISE PROGRAM: To be initiated next session (pt for now performing HEP given for previous bout of PT)  GOALS: Goals reviewed with patient? Yes  SHORT TERM GOALS: Target date: 12/03/2022  Patient will be independent in home exercise program to improve strength/mobility for better functional independence with ADLs. Baseline: to be initiated next 1-2 visits Goal status: INITIAL  LONG TERM GOALS: Target date: 01/14/2023  Patient will increase FOTO score to equal to or greater than  76  to demonstrate statistically significant improvement in mobility and quality of life.  Baseline: risk adjusted 51 Goal status: INITIAL  2.  Pt will reduce 5xsSTS time to < 8 sec to demonstrate clinically significant improvement in LE strength and balance to reduce risk of falls.  Baseline: previously 12.76 sec on 06/10/22, now 10 sec to RW on 10/22/2022 Goal status: INITIAL  3.  Pt will improve 6 MWT to > 1000' with no AD and no LOB to demonstrate ability to improve safety with community distances. Baseline: was 822 ft back in August 2023, now 808 with RW, CGA Goal status: INITIAL  4.  Pt will improve 10 m gait speed with LRAD to > 1.0 m/s to demonstrate reduced risk of falls along with safe ability to complete community walking tasks Baseline: previously 0.7 m/s on 06/10/2022, now 0.77 m/s with RW Goal status: INITIAL  5.  Patient will increase Berg Balance score by > 6 points to demonstrate decreased fall risk during functional activities.  Baseline: previously 35/56 on 03/11/22(?), test deferred to next appt; 11/13/2022: 36/56 Goal status: INITIAL   ASSESSMENT:  CLINICAL IMPRESSION: Further assessment completed on this date. Pt scored 36/56 on the Berg, indicating pt is at an increased risk for falls. She exhibited greatest difficulty with tandem stance and SLB tasks. PT also assisted pt  in adjusting her RW today to appropriate height and assessed for correct technique/use. Initiated manual therapy for stretching BLEs: pt with greatest stiffness of B gastrocs and B hip adductors. The pt will benefit from further skilled PT to address these deficits in order to decrease fall risk and increase safety and independence with ADLs.  OBJECTIVE IMPAIRMENTS: Abnormal gait, decreased activity tolerance, decreased balance, decreased coordination, decreased endurance, decreased knowledge of use of DME, decreased mobility, difficulty walking, decreased strength, impaired tone, improper body mechanics, postural dysfunction, and pain.   ACTIVITY LIMITATIONS: carrying, lifting, bending, standing, squatting, stairs, transfers, dressing, and locomotion level  PARTICIPATION LIMITATIONS: meal prep, cleaning, shopping, community activity, and yard work  PERSONAL FACTORS: Fitness, Sex, Time since onset of injury/illness/exacerbation, and 3+ comorbidities: PMH consists of the following: CP, depression, GAD, migraine headache, hx of LE surgery bilateral x2 in 2008  are also affecting patient's functional outcome.   REHAB POTENTIAL: Good  CLINICAL DECISION MAKING: Evolving/moderate complexity  EVALUATION COMPLEXITY: Moderate  PLAN:  PT FREQUENCY: 1-2x/week  PT DURATION: 12 weeks  PLANNED INTERVENTIONS: Therapeutic exercises, Therapeutic activity, Neuromuscular re-education, Balance training, Gait  training, Patient/Family education, Self Care, Joint mobilization, Stair training, Vestibular training, Canalith repositioning, Visual/preceptual remediation/compensation, Orthotic/Fit training, DME instructions, Dry Needling, Electrical stimulation, Wheelchair mobility training, Spinal mobilization, Cryotherapy, Moist heat, Splintting, Taping, Biofeedback, Manual therapy, and Re-evaluation  PLAN FOR NEXT SESSION: BERG, initiate HEP, training with walker/transfer training and uneven surfaces   Zollie Pee, PT 11/13/2022, 4:40 PM

## 2022-11-20 ENCOUNTER — Ambulatory Visit: Payer: Medicaid Other

## 2022-11-21 ENCOUNTER — Ambulatory Visit: Payer: Medicaid Other | Attending: Internal Medicine

## 2022-11-21 DIAGNOSIS — R262 Difficulty in walking, not elsewhere classified: Secondary | ICD-10-CM | POA: Diagnosis present

## 2022-11-21 DIAGNOSIS — R2689 Other abnormalities of gait and mobility: Secondary | ICD-10-CM | POA: Diagnosis present

## 2022-11-21 DIAGNOSIS — R2681 Unsteadiness on feet: Secondary | ICD-10-CM | POA: Insufficient documentation

## 2022-11-21 DIAGNOSIS — R278 Other lack of coordination: Secondary | ICD-10-CM | POA: Diagnosis present

## 2022-11-21 DIAGNOSIS — M6281 Muscle weakness (generalized): Secondary | ICD-10-CM | POA: Diagnosis present

## 2022-11-21 DIAGNOSIS — G809 Cerebral palsy, unspecified: Secondary | ICD-10-CM | POA: Diagnosis present

## 2022-11-21 NOTE — Therapy (Signed)
OUTPATIENT PHYSICAL THERAPY NEURO TREATMENT NOTE   Patient Name: Renee Walton MRN: [id number] DOB:1996-07-01, 27 y.o., female Today's Date: 11/21/2022   PCP: Perrin Maltese, MD REFERRING PROVIDER:   Perrin Maltese, MD    END OF SESSION:  PT End of Session - 11/21/22 1521     Visit Number 3    Number of Visits 25    Date for PT Re-Evaluation 01/14/23    PT Start Time 3151    PT Stop Time 1515    PT Time Calculation (min) 43 min    Equipment Utilized During Treatment Gait belt    Activity Tolerance Patient tolerated treatment well;No increased pain    Behavior During Therapy WFL for tasks assessed/performed              Past Medical History:  Diagnosis Date   Breakthrough bleeding    Cerebral palsy (HCC)    GAD (generalized anxiety disorder)    Migraine headache    Past Surgical History:  Procedure Laterality Date   LEG SURGERY     bilateral x2 in 2008   Patient Active Problem List   Diagnosis Date Noted   Loss of weight 07/17/2015   Depression 07/17/2015   Cerebral palsy ([facility]) 07/17/2015    ONSET DATE: 1994/01[birth year] pt has CP  REFERRING DIAG:   G80.9 (ICD-10-CM) - Cerebral palsy ([facility])    THERAPY DIAG:  Other lack of coordination  Unsteadiness on feet  Difficulty in walking, not elsewhere classified  Cerebral palsy, unspecified type ([place])  Rationale for Evaluation and Treatment: Rehabilitation  SUBJECTIVE:                                                                                                                                                                                             SUBJECTIVE STATEMENT: Pt reports one fall since last appt where she was ambulating without using RW while on carpet. She reports no injury with fall, was able to get up indep. Pt reports she still feels increased tightness throughout LLE. Pt reports no other symptoms. Pt accompanied by: family member  PERTINENT HISTORY:    Pt is a pleasant 27 y/o female with  CP who is returning to PT after absence and trip to [place]. She would like to improve her mobility, gait and balance. Pt has continued her PT exercises while away and worked on gait, and wants to become independent using a RW. She reports 2 recent falls, and no injuries. Pt also with stiffness in BLEs.  PMH consists of the following: CP, depression, GAD, migraine headache, hx of LE surgery bilateral x2 in 2008  PAIN: from eval Are you having pain? Yes: NPRS scale: 3/10 Pain location: superior/anterior LLE/near hip flexors, and throughout L quad  Pain description:   Aggravating factors:   Relieving factors:    PRECAUTIONS: Fall  WEIGHT BEARING RESTRICTIONS: No  FALLS: Has patient fallen in last 6 months? Yes. Number of falls 2 when walking. She reports no injury. She got back up independently  LIVING ENVIRONMENT: via chart, pt confirms no change in living situation since last seen Lives with: lives with their family Lives in: House/apartment - entry level home, two stories Stairs:  13 to second floor Has following equipment at home:  reports she has RW which she prefers to use because this can fold easily and fit into her car  PLOF: Needs assistance with ADLs and Needs assistance with gait  PATIENT GOALS: Pt would like to improve her ability to ambulate with RW both inside and over outside surfaces in order to increase her independence with mobility. She would like to learn how to safely get in and out of a car with her RW.  OBJECTIVE: taken at eval unless otherwise specified  DIAGNOSTIC FINDINGS:  No recent pertinent imaging per chart  COGNITION: Overall cognitive status: Within functional limits for tasks assessed   SENSATION: WNL BLE to light touch  COORDINATION: Impaired in BUE and BLE  EDEMA:  No edema observed or reported  MUSCLE TONE: impaired BLE, spasticity, hx of botox injections in LEs    POSTURE: forward head  LOWER EXTREMITY ROM:    Affected by spasticity    LOWER EXTREMITY MMT:  grossly 4/5 BLE, greatest impairment found in B hip flexors, B hip abd/adductors     TRANSFERS: Assistive device utilized:  hands-free from chair to RW for balance   Sit to stand: Modified independence Stand to sit: Modified independence  STAIRS: Reports able to ascend/descend steps mod I (with UE support)   GAIT: Gait pattern:  Pt exhibits scissoring gait, increased lateral trunk sway bilaterally, increased difficulty with clearing foot/foot flat/drag with fatigue, impaired hip and knee control throughout Distance walked: (see above)/clinic distances Assistive device utilized: Environmental consultant - 2 wheeled Level of assistance: CGA Comments: exhibits scissoring gait (see above), generally impaired  FUNCTIONAL TESTS:  5 times sit to stand: 10 sec to RW 6 minute walk test: 808 ft with RW, CGA 10 meter walk test: 0.77 m/s with RW Berg Balance Scale: deferred  PATIENT SURVEYS:  FOTO risk-adjusted score of 51 (goal score of 76)  TODAY'S TREATMENT:                                                                                                                              DATE:   At support surface - Standing supported gastroc stretch 2x30 sec each LE Standing hip flexor stretch 2x30 sec each LE Seated hamstring stretch (supported by 6" step) 2x30 sec each LE Comments: PT provides hands-on assist throughout to promote correct LE positioning  Updated HEP (see below)  Manual: Pt supine on plinth - several minutes PT provides the following long-duration stretching: B gastrocsoleus, B hamstrings, B hip adductors, B piriformis, B knee-to-chest  NMR:  Agility ladder step-length intervention with RW, close CGA. Cuing throughout for technique/increased step-length  PT provides further instruction in ambulating with RW to decrease fall risk   PATIENT EDUCATION: Education details: Pt educated throughout session about proper posture and technique with exercises.  Improved exercise technique, movement at target joints, use of target muscles after min to mod verbal, visual, tactile cues.  Person educated: Patient and Parent Education method: Explanation, Demonstration, and Verbal cues Education comprehension: verbalized understanding and returned demonstration  HOME EXERCISE PROGRAM: 11/21/2022: Access Code: [email or web address] URL: [email or web address] Date: 11/21/2022 Prepared by: Ricard Dillon  Exercises *corrected on handout (written) perform the following with UE support on counter - Standing Gastroc Stretch on Foam 1/2 Roll  - 1 x daily - 7 x weekly - 2 sets - 2 reps - 30 seconds hold - Standing Hip Flexor Stretch  - 1 x daily - 7 x weekly - 2 sets - 2 reps - 30 seconds hold  GOALS: Goals reviewed with patient? Yes  SHORT TERM GOALS: Target date: 12/03/2022  Patient will be independent in home exercise program to improve strength/mobility for better functional independence with ADLs. Baseline: to be initiated next 1-2 visits Goal status: INITIAL  LONG TERM GOALS: Target date: 01/14/2023  Patient will increase FOTO score to equal to or greater than  76  to demonstrate statistically significant improvement in mobility and quality of life.  Baseline: risk adjusted 51 Goal status: INITIAL  2.  Pt will reduce 5xsSTS time to < 8 sec to demonstrate clinically significant improvement in LE strength and balance to reduce risk of falls.  Baseline: previously 12.76 sec on 06/10/22, now 10 sec to RW on 10/22/2022 Goal status: INITIAL  3.  Pt will improve 6 MWT to > 1000' with no AD and no LOB to demonstrate ability to improve safety with community distances. Baseline: was 822 ft back in August 2023, now 808 with RW, CGA Goal status: INITIAL  4.  Pt will improve 10 m gait speed with LRAD to > 1.0 m/s to demonstrate reduced risk of falls along with safe ability to complete community walking tasks Baseline: previously 0.7 m/s on 06/10/2022, now 0.77  m/s with RW Goal status: INITIAL  5.  Patient will increase Berg Balance score by > 6 points to demonstrate decreased fall risk during functional activities.  Baseline: previously 35/56 on 03/11/22(?), test deferred to next appt; 11/13/2022: 36/56 Goal status: INITIAL   ASSESSMENT:  CLINICAL IMPRESSION: Beginning of appointment dedicated to instructing pt in standing and seated LE stretches with UE support in order to promote independence with activity and LE mobility. PT then followed-up with providing manual/passive stretching to promote further tissue extensibility of LE musculature. One of pt's chief concerns is that she ambulates with small steps impacting her gait speed. PT instructed pt through step-length exercise with agility ladder (using RW). This was challenging for pt. Will continue to focus on improving this deficit. The pt will benefit from further skilled PT to address these deficits in order to decrease fall risk and increase safety and independence with ADLs.  OBJECTIVE IMPAIRMENTS: Abnormal gait, decreased activity tolerance, decreased balance, decreased coordination, decreased endurance, decreased knowledge of use of DME, decreased mobility, difficulty walking, decreased strength, impaired tone, improper body mechanics, postural dysfunction, and pain.   ACTIVITY LIMITATIONS:  carrying, lifting, bending, standing, squatting, stairs, transfers, dressing, and locomotion level  PARTICIPATION LIMITATIONS: meal prep, cleaning, shopping, community activity, and yard work  PERSONAL FACTORS: Fitness, Sex, Time since onset of injury/illness/exacerbation, and 3+ comorbidities: PMH consists of the following: CP, depression, GAD, migraine headache, hx of LE surgery bilateral x2 in 2008  are also affecting patient's functional outcome.   REHAB POTENTIAL: Good  CLINICAL DECISION MAKING: Evolving/moderate complexity  EVALUATION COMPLEXITY: Moderate  PLAN:  PT FREQUENCY: 1-2x/week  PT  DURATION: 12 weeks  PLANNED INTERVENTIONS: Therapeutic exercises, Therapeutic activity, Neuromuscular re-education, Balance training, Gait training, Patient/Family education, Self Care, Joint mobilization, Stair training, Vestibular training, Canalith repositioning, Visual/preceptual remediation/compensation, Orthotic/Fit training, DME instructions, Dry Needling, Electrical stimulation, Wheelchair mobility training, Spinal mobilization, Cryotherapy, Moist heat, Splintting, Taping, Biofeedback, Manual therapy, and Re-evaluation  PLAN FOR NEXT SESSION: , training with walker/transfer (how to fold walker, get in/out of car), training and uneven surfaces, LE strengthening, gait exercises/step-length/gait-speed   Zollie Pee, PT 11/21/2022, 3:31 PM

## 2022-11-27 ENCOUNTER — Ambulatory Visit: Payer: Medicaid Other

## 2022-11-27 DIAGNOSIS — G809 Cerebral palsy, unspecified: Secondary | ICD-10-CM

## 2022-11-27 DIAGNOSIS — R262 Difficulty in walking, not elsewhere classified: Secondary | ICD-10-CM

## 2022-11-27 DIAGNOSIS — R2689 Other abnormalities of gait and mobility: Secondary | ICD-10-CM

## 2022-11-27 DIAGNOSIS — R278 Other lack of coordination: Secondary | ICD-10-CM

## 2022-11-27 DIAGNOSIS — R2681 Unsteadiness on feet: Secondary | ICD-10-CM

## 2022-11-27 DIAGNOSIS — M6281 Muscle weakness (generalized): Secondary | ICD-10-CM

## 2022-11-27 NOTE — Therapy (Signed)
OUTPATIENT PHYSICAL THERAPY NEURO TREATMENT NOTE   Patient Name: Renee Walton MRN: [id number] DOB:May 17, 1996, 27 y.o., female Today's Date: 11/27/2022   PCP: Perrin Maltese, MD REFERRING PROVIDER:   Perrin Maltese, MD    END OF SESSION:  PT End of Session - 11/27/22 1449     Visit Number 4    Number of Visits 25    Date for PT Re-Evaluation 01/14/23    Authorization Type Medicaid [place] Access    PT Start Time 1884    PT Stop Time 1515    PT Time Calculation (min) 40 min    Equipment Utilized During Treatment Gait belt    Activity Tolerance Patient tolerated treatment well;No increased pain    Behavior During Therapy WFL for tasks assessed/performed              Past Medical History:  Diagnosis Date   Breakthrough bleeding    Cerebral palsy (HCC)    GAD (generalized anxiety disorder)    Migraine headache    Past Surgical History:  Procedure Laterality Date   LEG SURGERY     bilateral x2 in 2008   Patient Active Problem List   Diagnosis Date Noted   Loss of weight 07/17/2015   Depression 07/17/2015   Cerebral palsy ([place]) 07/17/2015    ONSET DATE: 11-04-1993 pt has CP  REFERRING DIAG:   G80.9 (ICD-10-CM) - Cerebral palsy ([place])    THERAPY DIAG:  Other lack of coordination  Unsteadiness on feet  Difficulty in walking, not elsewhere classified  Cerebral palsy, unspecified type ([place])  Other abnormalities of gait and mobility  Muscle weakness (generalized)  Rationale for Evaluation and Treatment: Rehabilitation  SUBJECTIVE:                                                                                                                                                                                             SUBJECTIVE STATEMENT: PT doing well. Still thrilled to have her new RW and the independence it provides her. She reports 3 falls since last session, all when initiating gait from standing.    Pt accompanied by: Mom  PERTINENT HISTORY:     Pt is a pleasant 27 y/o female with CP who is returning to PT after absence and trip to [place]. She would like to improve her mobility, gait and balance. Pt has continued her PT exercises while away and worked on gait, and wants to become independent using a RW. She reports 2 recent falls, and no injuries. Pt also with stiffness in BLEs. PMH consists of the following: CP, depression, GAD, migraine headache, hx of LE  surgery bilateral x2 in 2008.    PAIN: from eval Are you having pain? No  PRECAUTIONS: Fall  WEIGHT BEARING RESTRICTIONS: No  FALLS: Has patient fallen in last 6 months? Yes. Number of falls 2 when walking. She reports no injury. She got back up independently   PATIENT GOALS: Pt would like to improve her ability to ambulate with RW both inside and over outside surfaces in order to increase her independence with mobility. She would like to learn how to safely get in and out of a car with her RW.  OBJECTIVE:     INTERVENTION 11/27/22  4 laps 617ft amb c RW 4'47"  (0.5M/S)   111ft AMB c 2.5lb AW bilat, 7.5lb AW on RW 1:52  10x STS from chair hands free (low effort, no LOB)  111ft AMB c 2.5lb AW bilat, 7.5lb AW on RW 1:39  STS from 12" box attempted, ankle DF weakness + spasticity   10x STS from 12" box + airex pad 1x10, overcomes ankle ROM limitation with forward trunk thrust   AMB without device, minGuard Assist from traction table to // bars  Lateral side stepping c BUE on // bar 2x52ft bilat   Backward steps in // bars with BUE on // bars 4 lengths *cued for mor effective trunk strategies to facilitate swing   PATIENT EDUCATION: Education details: Pt educated throughout session about proper posture and technique with exercises. Improved exercise technique, movement at target joints, use of target muscles after min to mod verbal, visual, tactile cues.  Person educated: Patient and Parent Education method: Explanation, Demonstration, and Verbal cues Education  comprehension: verbalized understanding and returned demonstration  HOME EXERCISE PROGRAM: 11/21/2022: Access Code: [email or web address] URL: [email or web address] Date: 11/21/2022 Prepared by: Ricard Dillon  Exercises *corrected on handout (written) perform the following with UE support on counter - Standing Gastroc Stretch on Foam 1/2 Roll  - 1 x daily - 7 x weekly - 2 sets - 2 reps - 30 seconds hold - Standing Hip Flexor Stretch  - 1 x daily - 7 x weekly - 2 sets - 2 reps - 30 seconds hold  GOALS: Goals reviewed with patient? Yes  SHORT TERM GOALS: Target date: 12/03/2022  Patient will be independent in home exercise program to improve strength/mobility for better functional independence with ADLs. Baseline: to be initiated next 1-2 visits Goal status: INITIAL  LONG TERM GOALS: Target date: 01/14/2023  Patient will increase FOTO score to equal to or greater than  76  to demonstrate statistically significant improvement in mobility and quality of life.  Baseline: risk adjusted 51 Goal status: INITIAL  2.  Pt will reduce 5xsSTS time to < 8 sec to demonstrate clinically significant improvement in LE strength and balance to reduce risk of falls.  Baseline: previously 12.76 sec on 06/10/22, now 10 sec to RW on 10/22/2022 Goal status: INITIAL  3.  Pt will improve 6 MWT to > 1000' with no AD and no LOB to demonstrate ability to improve safety with community distances. Baseline: was 822 ft back in August 2023, now 808 with RW, CGA Goal status: INITIAL  4.  Pt will improve 10 m gait speed with LRAD to > 1.0 m/s to demonstrate reduced risk of falls along with safe ability to complete community walking tasks Baseline: previously 0.7 m/s on 06/10/2022, now 0.77 m/s with RW Goal status: INITIAL  5.  Patient will increase Berg Balance score by > 6 points to demonstrate decreased fall risk during functional activities.  Baseline: previously 35/56 on 03/11/22(?), test deferred to next appt;  11/13/2022: 36/56 Goal status: INITIAL   ASSESSMENT:  CLINICAL IMPRESSION: Continued with current plan of care as laid out in evaluation and recent prior sessions. All interventions tolerated as expected by author, but trial and error required to obtain desired moderate resistance level. Mobility and dynamic postural control continue to progress as anticipated. Recovery intervals given as needed based on signs of exertion, audible RR, sweat, but pt reports under reports her harder efforts in the spirit of forward progress.  Pt educated on best technique for each intervention- author uses verbal, visual, tactile cues to optimize learning. Author takes steps to maximize patient independence when appropriate. Pt remains highly motivated. Pt closely monitored throughout session for safe activity response, as well as to maximize patient safety during interventions. The patient's therapy prognosis indicates continued potential for improvement, anticipate that future progress is attainable in a reasonable/predictable timeframe. Maximum improvement is within reach. Pt will continue to benefit from skilled PT services to address deficits and impairment identified in evaluation in order to maximize independence and safety in basic mobility required for performance of ADL, IADL, and leisure.     OBJECTIVE IMPAIRMENTS: Abnormal gait, decreased activity tolerance, decreased balance, decreased coordination, decreased endurance, decreased knowledge of use of DME, decreased mobility, difficulty walking, decreased strength, impaired tone, improper body mechanics, postural dysfunction, and pain.   ACTIVITY LIMITATIONS: carrying, lifting, bending, standing, squatting, stairs, transfers, dressing, and locomotion level  PARTICIPATION LIMITATIONS: meal prep, cleaning, shopping, community activity, and yard work  PERSONAL FACTORS: Fitness, Sex, Time since onset of injury/illness/exacerbation, and 3+ comorbidities: PMH  consists of the following: CP, depression, GAD, migraine headache, hx of LE surgery bilateral x2 in 2008  are also affecting patient's functional outcome.   REHAB POTENTIAL: Good  CLINICAL DECISION MAKING: Evolving/moderate complexity  EVALUATION COMPLEXITY: Moderate  PLAN:  PT FREQUENCY: 1-2x/week  PT DURATION: 12 weeks  PLANNED INTERVENTIONS: Therapeutic exercises, Therapeutic activity, Neuromuscular re-education, Balance training, Gait training, Patient/Family education, Self Care, Joint mobilization, Stair training, Vestibular training, Canalith repositioning, Visual/preceptual remediation/compensation, Orthotic/Fit training, DME instructions, Dry Needling, Electrical stimulation, Wheelchair mobility training, Spinal mobilization, Cryotherapy, Moist heat, Splintting, Taping, Biofeedback, Manual therapy, and Re-evaluation  PLAN FOR NEXT SESSION: , training with walker/transfer (how to fold walker, get in/out of car), training and uneven surfaces, LE strengthening, gait exercises/step-length/gait-speed    Phuc Kluttz C, PT 11/27/2022, 2:51 PM  2:51 PM, 11/27/22 Etta Grandchild, PT, DPT Physical Therapist - [facility]  [phone] [facility])

## 2022-12-04 ENCOUNTER — Ambulatory Visit: Payer: Medicaid Other

## 2022-12-10 ENCOUNTER — Other Ambulatory Visit: Payer: Self-pay | Admitting: Internal Medicine

## 2022-12-11 ENCOUNTER — Ambulatory Visit: Payer: Medicaid Other

## 2022-12-18 ENCOUNTER — Ambulatory Visit: Payer: Medicaid Other

## 2022-12-23 ENCOUNTER — Ambulatory Visit: Payer: Medicaid Other | Admitting: Internal Medicine

## 2022-12-25 ENCOUNTER — Ambulatory Visit: Payer: Medicaid Other | Attending: Internal Medicine

## 2022-12-25 DIAGNOSIS — R262 Difficulty in walking, not elsewhere classified: Secondary | ICD-10-CM | POA: Insufficient documentation

## 2022-12-25 DIAGNOSIS — G809 Cerebral palsy, unspecified: Secondary | ICD-10-CM | POA: Insufficient documentation

## 2022-12-25 DIAGNOSIS — M6281 Muscle weakness (generalized): Secondary | ICD-10-CM | POA: Insufficient documentation

## 2022-12-25 DIAGNOSIS — R278 Other lack of coordination: Secondary | ICD-10-CM | POA: Insufficient documentation

## 2022-12-25 DIAGNOSIS — M5459 Other low back pain: Secondary | ICD-10-CM | POA: Insufficient documentation

## 2022-12-25 DIAGNOSIS — R2681 Unsteadiness on feet: Secondary | ICD-10-CM | POA: Diagnosis present

## 2022-12-25 NOTE — Therapy (Signed)
OUTPATIENT PHYSICAL THERAPY NEURO TREATMENT NOTE   Patient Name: Renee Walton MRN: [id number] DOB:Jan 30, 1996, 27 y.o., female Today's Date: 12/25/2022   PCP: Perrin Maltese, MD REFERRING PROVIDER:   Perrin Maltese, MD    END OF SESSION:  PT End of Session - 12/25/22 1612     Visit Number 5    Number of Visits 25    Date for PT Re-Evaluation 01/14/23    Authorization Type Medicaid [place] Access    PT Start Time [id number]    PT Stop Time 1512    PT Time Calculation (min) 41 min    Equipment Utilized During Treatment Gait belt    Activity Tolerance Patient tolerated treatment well;No increased pain    Behavior During Therapy WFL for tasks assessed/performed              Past Medical History:  Diagnosis Date   Breakthrough bleeding    Cerebral palsy (HCC)    GAD (generalized anxiety disorder)    Migraine headache    Past Surgical History:  Procedure Laterality Date   LEG SURGERY     bilateral x2 in 2008   Patient Active Problem List   Diagnosis Date Noted   Loss of weight 07/17/2015   Depression 07/17/2015   Cerebral palsy ([place]) 07/17/2015    ONSET DATE: 10/01/1994 pt has CP  REFERRING DIAG:   G80.9 (ICD-10-CM) - Cerebral palsy ([place])    THERAPY DIAG:  Other lack of coordination  Difficulty in walking, not elsewhere classified  Unsteadiness on feet  Muscle weakness (generalized)  Cerebral palsy, unspecified type ([place])  Rationale for Evaluation and Treatment: Rehabilitation  SUBJECTIVE:                                                                                                                                                                                             SUBJECTIVE STATEMENT:Pt reports L side hip and LLE pain. She reports pain onset 2 days ago, she said it came on randomly, other sx include burning feeling in L foot. She reports doing her exercises. Pt reports she has been using her RW and reports no falls.    Pt accompanied by:  Mom  PERTINENT HISTORY:    Pt is a pleasant 27 y/o female with CP who is returning to PT after absence and trip to [place]. She would like to improve her mobility, gait and balance. Pt has continued her PT exercises while away and worked on gait, and wants to become independent using a RW. She reports 2 recent falls, and no injuries. Pt also with stiffness in BLEs. PMH consists of the  following: CP, depression, GAD, migraine headache, hx of LE surgery bilateral x2 in 2008.    PAIN: from eval Are you having pain? No  PRECAUTIONS: Fall  WEIGHT BEARING RESTRICTIONS: No  FALLS: Has patient fallen in last 6 months? Yes. Number of falls 2 when walking. She reports no injury. She got back up independently   PATIENT GOALS: Pt would like to improve her ability to ambulate with RW both inside and over outside surfaces in order to increase her independence with mobility. She would like to learn how to safely get in and out of a car with her RW.  OBJECTIVE:     INTERVENTION 12/25/22  TherEx:  3x148 ft with RW, close CGA and with 3# AW EACH LE. Pt mildly fatigued but rates easy  STS 2x15, maintains steadiness with intervention  Seated step-overs with 3# AW each side 2x10 each LE. Initially attempted with orange hurdle, however pt unable to clear height. PT replaced hurdle with half-foam. Pt noted to have improved AROM on L side with foot clearance compared to RLE.  Seated hamstring stretch with LE supported on 6" block 2x30-45 sec each LE  On mat table, pt performs knee to chest stretch 30 sec bilat LE  TherAct: PT provided instruction in how to open/fold RW. Provided demo/VC/TC. Pt with difficulty pushing down button to initiate closing of RW.  PT then assessed grip strength which was found to be 4/5 bilaterally.  PT issued soft green theraputty to pt with instruction to perform 2-3 minutes each hand individually, focus on making fist and pinch grip.  Pt reattempts to close RW with further  instruction/UE technique and is able to close 1x/push down button.   Manual: x 10 minutes Pt supine on mat table PT provides long duration stretching for bilateral hip adductors, bilateral gastrocs.  Pt reports stretching feels good to LE.    PATIENT EDUCATION: Education details: Pt educated throughout session about proper posture and technique with exercises. Improved exercise technique, movement at target joints, use of target muscles after min to mod verbal, visual, tactile cues. Instruction in how to close/fold and open RW.  Person educated: Patient and Parent Education method: Explanation, Demonstration, and Verbal cues Education comprehension: verbalized understanding and returned demonstration  HOME EXERCISE PROGRAM: 11/21/2022: Access Code: [email or web address] URL: [email or web address] Date: 11/21/2022 Prepared by: Ricard Dillon  Exercises *corrected on handout (written) perform the following with UE support on counter - Standing Gastroc Stretch on Foam 1/2 Roll  - 1 x daily - 7 x weekly - 2 sets - 2 reps - 30 seconds hold - Standing Hip Flexor Stretch  - 1 x daily - 7 x weekly - 2 sets - 2 reps - 30 seconds hold  GOALS: Goals reviewed with patient? Yes  SHORT TERM GOALS: Target date: 12/03/2022  Patient will be independent in home exercise program to improve strength/mobility for better functional independence with ADLs. Baseline: to be initiated next 1-2 visits Goal status: INITIAL  LONG TERM GOALS: Target date: 01/14/2023  Patient will increase FOTO score to equal to or greater than  76  to demonstrate statistically significant improvement in mobility and quality of life.  Baseline: risk adjusted 51 Goal status: INITIAL  2.  Pt will reduce 5xsSTS time to < 8 sec to demonstrate clinically significant improvement in LE strength and balance to reduce risk of falls.  Baseline: previously 12.76 sec on 06/10/22, now 10 sec to RW on 10/22/2022 Goal status: INITIAL  3.   Pt will improve  6 MWT to > 1000' with no AD and no LOB to demonstrate ability to improve safety with community distances. Baseline: was 822 ft back in August 2023, now 808 with RW, CGA Goal status: INITIAL  4.  Pt will improve 10 m gait speed with LRAD to > 1.0 m/s to demonstrate reduced risk of falls along with safe ability to complete community walking tasks Baseline: previously 0.7 m/s on 06/10/2022, now 0.77 m/s with RW Goal status: INITIAL  5.  Patient will increase Berg Balance score by > 6 points to demonstrate decreased fall risk during functional activities.  Baseline: previously 35/56 on 03/11/22(?), test deferred to next appt; 11/13/2022: 36/56 Goal status: INITIAL   ASSESSMENT:  CLINICAL IMPRESSION: Pt exhibits progress by reporting increased independence with ADLs using her RW and no recent falls. PT provided instruction in session on how to fold and open RW and found pt does not quite have sufficient strength in B hands (4/5 B) to complete this task without assistance. PT provided pt with theraputty and instructed in use to address this deficit. Pt verbalized understanding and demonstrated correct technique in session. Pt will continue to benefit from skilled PT services to address deficits and impairment identified in evaluation in order to maximize independence and safety in basic mobility required for performance of ADL, IADL, and leisure.     OBJECTIVE IMPAIRMENTS: Abnormal gait, decreased activity tolerance, decreased balance, decreased coordination, decreased endurance, decreased knowledge of use of DME, decreased mobility, difficulty walking, decreased strength, impaired tone, improper body mechanics, postural dysfunction, and pain.   ACTIVITY LIMITATIONS: carrying, lifting, bending, standing, squatting, stairs, transfers, dressing, and locomotion level  PARTICIPATION LIMITATIONS: meal prep, cleaning, shopping, community activity, and yard work  PERSONAL FACTORS: Fitness,  Sex, Time since onset of injury/illness/exacerbation, and 3+ comorbidities: PMH consists of the following: CP, depression, GAD, migraine headache, hx of LE surgery bilateral x2 in 2008  are also affecting patient's functional outcome.   REHAB POTENTIAL: Good  CLINICAL DECISION MAKING: Evolving/moderate complexity  EVALUATION COMPLEXITY: Moderate  PLAN:  PT FREQUENCY: 1-2x/week  PT DURATION: 12 weeks  PLANNED INTERVENTIONS: Therapeutic exercises, Therapeutic activity, Neuromuscular re-education, Balance training, Gait training, Patient/Family education, Self Care, Joint mobilization, Stair training, Vestibular training, Canalith repositioning, Visual/preceptual remediation/compensation, Orthotic/Fit training, DME instructions, Dry Needling, Electrical stimulation, Wheelchair mobility training, Spinal mobilization, Cryotherapy, Moist heat, Splintting, Taping, Biofeedback, Manual therapy, and Re-evaluation  PLAN FOR NEXT SESSION: , training with walker/transfer (how to fold walker, get in/out of car), training and uneven surfaces, LE strengthening, gait exercises/step-length/gait-speed    Zollie Pee, PT 12/25/2022, 4:23 PM  4:23 PM, 12/25/22 Etta Grandchild, PT, DPT Physical Therapist - [facility]  [phone] [facility])

## 2023-01-01 ENCOUNTER — Ambulatory Visit: Payer: Medicaid Other

## 2023-01-01 DIAGNOSIS — M6281 Muscle weakness (generalized): Secondary | ICD-10-CM

## 2023-01-01 DIAGNOSIS — R262 Difficulty in walking, not elsewhere classified: Secondary | ICD-10-CM

## 2023-01-01 DIAGNOSIS — R278 Other lack of coordination: Secondary | ICD-10-CM | POA: Diagnosis not present

## 2023-01-01 DIAGNOSIS — R2681 Unsteadiness on feet: Secondary | ICD-10-CM

## 2023-01-01 NOTE — Therapy (Signed)
OUTPATIENT PHYSICAL THERAPY NEURO TREATMENT NOTE   Patient Name: Renee Walton MRN: [id number] DOB:01/01/1996, 27 y.o., female Today's Date: 01/01/2023   PCP: Perrin Maltese, MD REFERRING PROVIDER:   Perrin Maltese, MD    END OF SESSION:  PT End of Session - 01/01/23 1730     Visit Number 6    Number of Visits 25    Date for PT Re-Evaluation 01/14/23    Authorization Type Medicaid [place] Access    PT Start Time [id number]    PT Stop Time 1513    PT Time Calculation (min) 41 min    Equipment Utilized During Treatment Gait belt    Activity Tolerance Patient tolerated treatment well;No increased pain    Behavior During Therapy WFL for tasks assessed/performed               Past Medical History:  Diagnosis Date   Breakthrough bleeding    Cerebral palsy (HCC)    GAD (generalized anxiety disorder)    Migraine headache    Past Surgical History:  Procedure Laterality Date   LEG SURGERY     bilateral x2 in 2008   Patient Active Problem List   Diagnosis Date Noted   Loss of weight 07/17/2015   Depression 07/17/2015   Cerebral palsy ([place]) 07/17/2015    ONSET DATE: Mar 15, 1996 pt has CP  REFERRING DIAG:   G80.9 (ICD-10-CM) - Cerebral palsy ([place])    THERAPY DIAG:  Other lack of coordination  Difficulty in walking, not elsewhere classified  Unsteadiness on feet  Muscle weakness (generalized)  Rationale for Evaluation and Treatment: Rehabilitation  SUBJECTIVE:                                                                                                                                                                                             SUBJECTIVE STATEMENT. Pt reports toe pain LLE and also L side lower abdominal pain. Reports pain can reach 5/10, last a few hours and fade away. This has been going on for a while. She reports stretching does help her pain. She plans to follow-up with her physician to discuss this.    Pt accompanied by: Mom  PERTINENT  HISTORY:    Pt is a pleasant 27 y/o female with CP who is returning to PT after absence and trip to [place]. She would like to improve her mobility, gait and balance. Pt has continued her PT exercises while away and worked on gait, and wants to become independent using a RW. She reports 2 recent falls, and no injuries. Pt also with stiffness in BLEs. PMH consists of the following:  CP, depression, GAD, migraine headache, hx of LE surgery bilateral x2 in 2008.    PAIN: from eval Are you having pain? No  PRECAUTIONS: Fall  WEIGHT BEARING RESTRICTIONS: No  FALLS: Has patient fallen in last 6 months? Yes. Number of falls 2 when walking. She reports no injury. She got back up independently   PATIENT GOALS: Pt would like to improve her ability to ambulate with RW both inside and over outside surfaces in order to increase her independence with mobility. She would like to learn how to safely get in and out of a car with her RW.  OBJECTIVE:     INTERVENTION 01/01/23  TherAct:  Gait outside up/down slopes, over curbs with RW x several minutes. CGA provided throughout. No LOB/unsteadiness  Assessment of 2nd toe LLE. Skin of digits WNL, no redness present. Slightly TTP, and hypomobility noted but still somewhat flexible. Pt noted toe have narrow toe-box of shoe, likely causing increased pressure between 2nd and 3rd toe. PT recommends shoe with wider toe-box.  Pt noted to have some swelling dorsum of B feet. Pt's mother thinks prayer positioning may be contributing to swelling. PT recommends modifications to off-set pressure. Will continue to monitor.   Instruction in technique to open and close RW. Pt able to close L side, has difficulty closing R (attempting using LUE), this is likely due to decreased strength. This intervention was followed up with clothes pin pinch-grip intervention x multiple reps LUE  TherEx: Seated, block supported, hamstring stretch 60 sec BLE    PATIENT  EDUCATION: Education details: Pt educated throughout session about proper posture and technique with exercises. Improved exercise technique, movement at target joints, use of target muscles after min to mod verbal, visual, tactile cues.    Person educated: Patient and Parent Education method: Explanation, Demonstration, and Verbal cues Education comprehension: verbalized understanding and returned demonstration  HOME EXERCISE PROGRAM: 11/21/2022: Access Code: [email or web address] URL: [email or web address] Date: 11/21/2022 Prepared by: Ricard Dillon  Exercises *corrected on handout (written) perform the following with UE support on counter - Standing Gastroc Stretch on Foam 1/2 Roll  - 1 x daily - 7 x weekly - 2 sets - 2 reps - 30 seconds hold - Standing Hip Flexor Stretch  - 1 x daily - 7 x weekly - 2 sets - 2 reps - 30 seconds hold  GOALS: Goals reviewed with patient? Yes  SHORT TERM GOALS: Target date: 12/03/2022  Patient will be independent in home exercise program to improve strength/mobility for better functional independence with ADLs. Baseline: to be initiated next 1-2 visits Goal status: INITIAL  LONG TERM GOALS: Target date: 01/14/2023  Patient will increase FOTO score to equal to or greater than  76  to demonstrate statistically significant improvement in mobility and quality of life.  Baseline: risk adjusted 51 Goal status: INITIAL  2.  Pt will reduce 5xsSTS time to < 8 sec to demonstrate clinically significant improvement in LE strength and balance to reduce risk of falls.  Baseline: previously 12.76 sec on 06/10/22, now 10 sec to RW on 10/22/2022 Goal status: INITIAL  3.  Pt will improve 6 MWT to > 1000' with no AD and no LOB to demonstrate ability to improve safety with community distances. Baseline: was 822 ft back in August 2023, now 808 with RW, CGA Goal status: INITIAL  4.  Pt will improve 10 m gait speed with LRAD to > 1.0 m/s to demonstrate reduced risk of  falls along with safe ability to complete  community walking tasks Baseline: previously 0.7 m/s on 06/10/2022, now 0.77 m/s with RW Goal status: INITIAL  5.  Patient will increase Berg Balance score by > 6 points to demonstrate decreased fall risk during functional activities.  Baseline: previously 35/56 on 03/11/22(?), test deferred to next appt; 11/13/2022: 36/56 Goal status: INITIAL   ASSESSMENT:  CLINICAL IMPRESSION: PT provided further instruction in opening/closing of RW. Pt also trialed ambulation outside with RW and exhibited good control/balance. Pt with c/o L 2nd toe pain, PT recommends shoe with wider toe-box at this time. Pt found to have swelling B feet (reported not new), will continue to monitor. The pt will continue to benefit from skilled PT services to address deficits and impairment identified in evaluation in order to maximize independence and safety in basic mobility required for performance of ADL, IADL, and leisure.     OBJECTIVE IMPAIRMENTS: Abnormal gait, decreased activity tolerance, decreased balance, decreased coordination, decreased endurance, decreased knowledge of use of DME, decreased mobility, difficulty walking, decreased strength, impaired tone, improper body mechanics, postural dysfunction, and pain.   ACTIVITY LIMITATIONS: carrying, lifting, bending, standing, squatting, stairs, transfers, dressing, and locomotion level  PARTICIPATION LIMITATIONS: meal prep, cleaning, shopping, community activity, and yard work  PERSONAL FACTORS: Fitness, Sex, Time since onset of injury/illness/exacerbation, and 3+ comorbidities: PMH consists of the following: CP, depression, GAD, migraine headache, hx of LE surgery bilateral x2 in 2008  are also affecting patient's functional outcome.   REHAB POTENTIAL: Good  CLINICAL DECISION MAKING: Evolving/moderate complexity  EVALUATION COMPLEXITY: Moderate  PLAN:  PT FREQUENCY: 1-2x/week  PT DURATION: 12 weeks  PLANNED  INTERVENTIONS: Therapeutic exercises, Therapeutic activity, Neuromuscular re-education, Balance training, Gait training, Patient/Family education, Self Care, Joint mobilization, Stair training, Vestibular training, Canalith repositioning, Visual/preceptual remediation/compensation, Orthotic/Fit training, DME instructions, Dry Needling, Electrical stimulation, Wheelchair mobility training, Spinal mobilization, Cryotherapy, Moist heat, Splintting, Taping, Biofeedback, Manual therapy, and Re-evaluation  PLAN FOR NEXT SESSION: , training with walker/transfer (how to fold walker, get in/out of car), training and uneven surfaces, LE strengthening, gait exercises/step-length/gait-speed    Zollie Pee, PT 01/01/2023, 5:41 PM  5:41 PM, 01/01/23 Physical Therapist - [facility]  [phone] [facility])

## 2023-01-08 ENCOUNTER — Ambulatory Visit: Payer: Medicaid Other

## 2023-01-08 DIAGNOSIS — R262 Difficulty in walking, not elsewhere classified: Secondary | ICD-10-CM

## 2023-01-08 DIAGNOSIS — M5459 Other low back pain: Secondary | ICD-10-CM

## 2023-01-08 DIAGNOSIS — M6281 Muscle weakness (generalized): Secondary | ICD-10-CM

## 2023-01-08 DIAGNOSIS — R278 Other lack of coordination: Secondary | ICD-10-CM | POA: Diagnosis not present

## 2023-01-08 NOTE — Therapy (Signed)
OUTPATIENT PHYSICAL THERAPY NEURO TREATMENT NOTE   Patient Name: Renee Walton MRN: [id number] DOB:02/06/1996, 27 y.o., female Today's Date: 01/08/2023   PCP: Perrin Maltese, MD REFERRING PROVIDER:   Perrin Maltese, MD    END OF SESSION:  PT End of Session - 01/08/23 1524     Visit Number 7    Number of Visits 25    Date for PT Re-Evaluation 01/14/23    Authorization Type Medicaid [place] Access    PT Start Time [id number]    PT Stop Time 1510    PT Time Calculation (min) 35 min    Equipment Utilized During Treatment Gait belt    Activity Tolerance Patient tolerated treatment well;No increased pain    Behavior During Therapy WFL for tasks assessed/performed               Past Medical History:  Diagnosis Date   Breakthrough bleeding    Cerebral palsy (HCC)    GAD (generalized anxiety disorder)    Migraine headache    Past Surgical History:  Procedure Laterality Date   LEG SURGERY     bilateral x2 in 2008   Patient Active Problem List   Diagnosis Date Noted   Loss of weight 07/17/2015   Depression 07/17/2015   Cerebral palsy ([place]) 07/17/2015    ONSET DATE: Feb 16, 1996 pt has CP  REFERRING DIAG:   G80.9 (ICD-10-CM) - Cerebral palsy ([place])    THERAPY DIAG:  Other low back pain  Muscle weakness (generalized)  Difficulty in walking, not elsewhere classified  Rationale for Evaluation and Treatment: Rehabilitation  SUBJECTIVE:                                                                                                                                                                                             SUBJECTIVE STATEMENT. Pt reports  LBP and LLE lateral thigh numbness. She does feel pins and needles feeling into her L foot. Pt reports back pain onset 2 days ago. Pins and needles/numbness not new but pt does feel it's a bit worse than before.Pt reports toe feels fine today   Pt accompanied by: Mom  PERTINENT HISTORY:    Pt is a pleasant 27 y/o  female with CP who is returning to PT after absence and trip to [place]. She would like to improve her mobility, gait and balance. Pt has continued her PT exercises while away and worked on gait, and wants to become independent using a RW. She reports 2 recent falls, and no injuries. Pt also with stiffness in BLEs. PMH consists of the following: CP, depression, GAD, migraine headache, hx  of LE surgery bilateral x2 in 2008.    PAIN: from eval Are you having pain? No  PRECAUTIONS: Fall  WEIGHT BEARING RESTRICTIONS: No  FALLS: Has patient fallen in last 6 months? Yes. Number of falls 2 when walking. She reports no injury. She got back up independently   PATIENT GOALS: Pt would like to improve her ability to ambulate with RW both inside and over outside surfaces in order to increase her independence with mobility. She would like to learn how to safely get in and out of a car with her RW.  OBJECTIVE:     INTERVENTION 01/08/23  Manual: On large mat table: PT provides long duration stretching for the following LE musculature Bilateral adductuors, bilateral hip flexors, bilateral hamstrings, bilateral gastrocs, bilateral knee to chest Comments: pt reports hip flexor stretch improves L side symptoms/pain   Therex: Glute bridge 10x, 20x SLR 3x10 each LE. PT assisting with LE positioning. Difficulty with eccentric control  STS 1x10, 1x15    PATIENT EDUCATION: Education details: Pt educated throughout session about proper posture and technique with exercises. Improved exercise technique, movement at target joints, use of target muscles after min to mod verbal, visual, tactile cues.    Person educated: Patient and Parent Education method: Explanation, Demonstration, and Verbal cues Education comprehension: verbalized understanding and returned demonstration  HOME EXERCISE PROGRAM: 11/21/2022: Access Code: [email or web address] URL: [email or web address] Date: 11/21/2022 Prepared by:  Ricard Dillon  Exercises *corrected on handout (written) perform the following with UE support on counter - Standing Gastroc Stretch on Foam 1/2 Roll  - 1 x daily - 7 x weekly - 2 sets - 2 reps - 30 seconds hold - Standing Hip Flexor Stretch  - 1 x daily - 7 x weekly - 2 sets - 2 reps - 30 seconds hold  GOALS: Goals reviewed with patient? Yes  SHORT TERM GOALS: Target date: 12/03/2022  Patient will be independent in home exercise program to improve strength/mobility for better functional independence with ADLs. Baseline: to be initiated next 1-2 visits Goal status: INITIAL  LONG TERM GOALS: Target date: 01/14/2023  Patient will increase FOTO score to equal to or greater than  76  to demonstrate statistically significant improvement in mobility and quality of life.  Baseline: risk adjusted 51 Goal status: INITIAL  2.  Pt will reduce 5xsSTS time to < 8 sec to demonstrate clinically significant improvement in LE strength and balance to reduce risk of falls.  Baseline: previously 12.76 sec on 06/10/22, now 10 sec to RW on 10/22/2022 Goal status: INITIAL  3.  Pt will improve 6 MWT to > 1000' with no AD and no LOB to demonstrate ability to improve safety with community distances. Baseline: was 822 ft back in August 2023, now 808 with RW, CGA Goal status: INITIAL  4.  Pt will improve 10 m gait speed with LRAD to > 1.0 m/s to demonstrate reduced risk of falls along with safe ability to complete community walking tasks Baseline: previously 0.7 m/s on 06/10/2022, now 0.77 m/s with RW Goal status: INITIAL  5.  Patient will increase Berg Balance score by > 6 points to demonstrate decreased fall risk during functional activities.  Baseline: previously 35/56 on 03/11/22(?), test deferred to next appt; 11/13/2022: 36/56 Goal status: INITIAL   ASSESSMENT:  CLINICAL IMPRESSION: Pt with LBP today with onset two days ago, continues to have LLE symptoms into foot. Session focused on stretching and LE  strengthening. Pt tolerated these interventions well without increase in pain  and reported hip flexor stretching relieved pain on L side.The pt will continue to benefit from skilled PT services to address deficits and impairment identified in evaluation in order to maximize independence and safety in basic mobility required for performance of ADL, IADL, and leisure.     OBJECTIVE IMPAIRMENTS: Abnormal gait, decreased activity tolerance, decreased balance, decreased coordination, decreased endurance, decreased knowledge of use of DME, decreased mobility, difficulty walking, decreased strength, impaired tone, improper body mechanics, postural dysfunction, and pain.   ACTIVITY LIMITATIONS: carrying, lifting, bending, standing, squatting, stairs, transfers, dressing, and locomotion level  PARTICIPATION LIMITATIONS: meal prep, cleaning, shopping, community activity, and yard work  PERSONAL FACTORS: Fitness, Sex, Time since onset of injury/illness/exacerbation, and 3+ comorbidities: PMH consists of the following: CP, depression, GAD, migraine headache, hx of LE surgery bilateral x2 in 2008  are also affecting patient's functional outcome.   REHAB POTENTIAL: Good  CLINICAL DECISION MAKING: Evolving/moderate complexity  EVALUATION COMPLEXITY: Moderate  PLAN:  PT FREQUENCY: 1-2x/week  PT DURATION: 12 weeks  PLANNED INTERVENTIONS: Therapeutic exercises, Therapeutic activity, Neuromuscular re-education, Balance training, Gait training, Patient/Family education, Self Care, Joint mobilization, Stair training, Vestibular training, Canalith repositioning, Visual/preceptual remediation/compensation, Orthotic/Fit training, DME instructions, Dry Needling, Electrical stimulation, Wheelchair mobility training, Spinal mobilization, Cryotherapy, Moist heat, Splintting, Taping, Biofeedback, Manual therapy, and Re-evaluation  PLAN FOR NEXT SESSION: , training with walker/transfer (how to fold walker, get in/out of  car), training and uneven surfaces, LE strengthening, gait exercises/step-length/gait-speed    Zollie Pee, PT 01/08/2023, 3:28 PM  3:28 PM, 01/08/23 Physical Therapist - [facility]  [phone] [facility])

## 2023-01-15 ENCOUNTER — Ambulatory Visit: Payer: Medicaid Other

## 2023-01-22 ENCOUNTER — Ambulatory Visit: Payer: Medicaid Other | Attending: Internal Medicine

## 2023-01-22 DIAGNOSIS — M6281 Muscle weakness (generalized): Secondary | ICD-10-CM | POA: Diagnosis not present

## 2023-01-22 DIAGNOSIS — R262 Difficulty in walking, not elsewhere classified: Secondary | ICD-10-CM | POA: Diagnosis present

## 2023-01-22 DIAGNOSIS — R2681 Unsteadiness on feet: Secondary | ICD-10-CM | POA: Diagnosis present

## 2023-01-22 DIAGNOSIS — M25661 Stiffness of right knee, not elsewhere classified: Secondary | ICD-10-CM | POA: Diagnosis present

## 2023-01-22 DIAGNOSIS — R278 Other lack of coordination: Secondary | ICD-10-CM | POA: Diagnosis present

## 2023-01-22 DIAGNOSIS — M25662 Stiffness of left knee, not elsewhere classified: Secondary | ICD-10-CM | POA: Insufficient documentation

## 2023-01-22 DIAGNOSIS — M79605 Pain in left leg: Secondary | ICD-10-CM | POA: Diagnosis present

## 2023-01-22 NOTE — Therapy (Addendum)
OUTPATIENT PHYSICAL THERAPY NEURO TREATMENT NOTE/RECERT   Patient Name: Renee Walton MRN: [id number] DOB:03/06/1996, 27 y.o., female Today's Date: 02/06/2023   PCP: [name], MD REFERRING PROVIDER:   [name], MD    END OF SESSION:  PT End of Session - 02/06/23 0932     Visit Number 8    Number of Visits 20    Date for PT Re-Evaluation 04/16/23    Authorization Type Medicaid [place] Access    PT Start Time 1432    PT Stop Time 1518    PT Time Calculation (min) 46 min    Equipment Utilized During Treatment Gait belt    Activity Tolerance Patient tolerated treatment well;No increased pain    Behavior During Therapy WFL for tasks assessed/performed               Past Medical History:  Diagnosis Date   Breakthrough bleeding    Cerebral palsy    GAD (generalized anxiety disorder)    Migraine headache    Past Surgical History:  Procedure Laterality Date   LEG SURGERY     bilateral x2 in 2008   Patient Active Problem List   Diagnosis Date Noted   Loss of weight 07/17/2015   Depression 07/17/2015   Cerebral palsy 07/17/2015    ONSET DATE: 06/27/94 pt has CP  REFERRING DIAG:   G80.9 (ICD-10-CM) - Cerebral palsy (HCC)    THERAPY DIAG:  Muscle weakness (generalized)  Pain in left leg  Other lack of coordination  Difficulty in walking, not elsewhere classified  Joint stiffness of left lower leg  Joint stiffness of right lower leg  Rationale for Evaluation and Treatment: Rehabilitation  SUBJECTIVE:                                                                                                                                                                                             SUBJECTIVE STATEMENT. Pt reports continued generalized LLE pain, rates it as a 4/10. She has been having trouble walking slowly with her walker, feels it is difficult to pick up legs for steps due to LE stiffness/pain, particularly LLE. She reports she has  botox appointment in May.   Pt accompanied by: Mom  PERTINENT HISTORY:    Pt is a pleasant 27 y/o female with CP who is returning to PT after absence and trip to [place]. She would like to improve her mobility, gait and balance. Pt has continued her PT exercises while away and worked on gait, and wants to become independent using a RW. She reports 2 recent falls, and no injuries. Pt also with  stiffness in BLEs. PMH consists of the following: CP, depression, GAD, migraine headache, hx of LE surgery bilateral x2 in 2008.    PAIN: from eval Are you having pain? No  PRECAUTIONS: Fall  WEIGHT BEARING RESTRICTIONS: No  FALLS: Has patient fallen in last 6 months? Yes. Number of falls 2 when walking. She reports no injury. She got back up independently   PATIENT GOALS: Pt would like to improve her ability to ambulate with RW both inside and over outside surfaces in order to increase her independence with mobility. She would like to learn how to safely get in and out of a car with her RW.  OBJECTIVE:     INTERVENTION 02/06/23   Manual: Integumentary: assessed as pt reports feeling LLE pain/swelling No swelling observed LLE, no bogginess, not tender to touch. Skin appears WNL, comparable to R LE. Temperature WNL. Pt does appear to have increased tone LLE today. Has upcoming botox appt in May.  On large mat table: PT provides long duration stretching for the following LE musculature- Bilateral adductuors, bilateral hamstrings, bilateral gastrocs, bilateral knee to chest PROM abd/adduct 10x each LE  Comments: Noted increased tone LLE today with hamstring stretch, abduction, knee-to-chest   Therex:  Glute bridge 3x10. Pt rates medium  SLR 2x10 each LE   Attempted side-lye clamshell and straight leg raise, but too difficult, pt does not have enough strength to complete. Transitioned to hooklye hip ER/abduction with YTB, still very difficult.  Transitioned to seated hip abduction 2x10  AROM. Challenging, but improved AROM compared to other variations.   STS 3x10   Updated HEP to address strength deficits  No pain with interventions   PATIENT EDUCATION: Education details: Pt educated throughout session about proper posture and technique with exercises. Improved exercise technique, movement at target joints, use of target muscles after min to mod verbal, visual, tactile cues.    Person educated: Patient and Parent Education method: Explanation, Demonstration, and Verbal cues Education comprehension: verbalized understanding and returned demonstration  HOME EXERCISE PROGRAM:   01/22/2023: Access Code: [email or web address] URL: [email or web address] Date: 01/22/2023 Prepared by: Temple Pacini  Exercises - Supine Bridge  - 1 x daily - 5 x weekly - 3 sets - 10 reps - Supine Straight Leg Raise  - 1 x daily - 5 x weekly - 2 sets - 10 reps - Seated Hip Abduction with Resistance  - 1 x daily - 5 x weekly - 2 sets - 10 reps  11/21/2022: Access Code: [email or web address] URL: [email or web address] Date: 11/21/2022 Prepared by: Temple Pacini  Exercises *corrected on handout (written) perform the following with UE support on counter - Standing Gastroc Stretch on Foam 1/2 Roll  - 1 x daily - 7 x weekly - 2 sets - 2 reps - 30 seconds hold - Standing Hip Flexor Stretch  - 1 x daily - 7 x weekly - 2 sets - 2 reps - 30 seconds hold  GOALS: Goals reviewed with patient? Yes   SHORT TERM GOALS: Target date: 12/03/2022, NEW target date 03/05/2023  Patient will be independent in home exercise program to improve strength/mobility for better functional independence with ADLs. Baseline: to be initiated next 1-2 visits Goal status: INITIAL  LONG TERM GOALS: Target date: 01/14/2023, NEW target date 04/16/2023  Patient will increase FOTO score to equal to or greater than  76  to demonstrate statistically significant improvement in mobility and quality of life.  Baseline: risk  adjusted 51; addended 4/18 pt to complete FOTO next 1-2 visits  Goal status: INITIAL ONGOING  2.  Pt will reduce 5xsSTS time to < 8 sec to demonstrate clinically significant improvement in LE strength and balance to reduce risk of falls.  Baseline: previously 12.76 sec on 06/10/22, now 10 sec to RW on 10/22/2022; on 01/22/2023 pt exhibited ability to perform STS hands-free and able to maintain balance without RW, however, to be formally timed next 1-2 visits Goal status: INITIAL ONGOING  3.  Pt will improve 6 MWT to > 1000' with RW and no LOB to demonstrate ability to improve safety with community distances. Baseline: was 822 ft back in August 2023, now 808 with RW, CGA; addendum 02/06/23, pt to be retested next visit, however, goal revised to completing with RW to decrease fall risk Goal status: INITIAL REVISED   4.  Pt will improve 10 m gait speed with LRAD to > 1.0 m/s to demonstrate reduced risk of falls along with safe ability to complete community walking tasks Baseline: previously 0.7 m/s on 06/10/2022, now 0.77 m/s with RW; addend 02/06/2023 pt to retest next visit from this date. Pt using RW for LRAD to ambulate Goal status: INITIAL ONGOING  5.  Patient will increase Berg Balance score by > 6 points to demonstrate decreased fall risk during functional activities.  Baseline: previously 35/56 on 03/11/22(?), test deferred to next appt; 11/13/2022: 36/56; addendum pt to retest next visit from 02/06/2023 Goal status: INITIAL ONGOING   ASSESSMENT:  CLINICAL IMPRESSION: Pt with continued LLE pain/stiffness. She does exhibit increased tone today in LLE when PT providing LE stretches. She did report feeling as if LLE was swollen presenting to session. PT assessed LLE found no swelling, skin appearance and temperature WNL, not TTP. PT did instruct pt to call her physician immediately should she develop redness and/or swelling LLE. Pt verbalized understanding. Pt also with difficulty performing  supine and seated therex. Updated HEP to better address these deficits. The pt will continue to benefit from skilled PT services to address deficits and impairment identified in evaluation in order to maximize independence and safety in basic mobility required for performance of ADL, IADL, and leisure.  Addendum completed for recert. Over this reporting period, the pt has reported decreased frequency of falls now that she is using RW as primary AD to ambulate. This is an improvement from start of PT where pt was often attempting to ambulate without AD and reporting falls. Pt has also exhibited ability to complete STS hands-free without LOB. These reports and demonstrations indicate general decrease in fall risk. Pt has also reported greater independence with ADLs. Formal goal testing to be completed next visit following this 02/06/2023 addendum. Patient's condition has the potential to improve in response to therapy. Maximum improvement is yet to be obtained. The anticipated improvement is attainable and reasonable in a generally predictable time.       OBJECTIVE IMPAIRMENTS: Abnormal gait, decreased activity tolerance, decreased balance, decreased coordination, decreased endurance, decreased knowledge of use of DME, decreased mobility, difficulty walking, decreased strength, impaired tone, improper body mechanics, postural dysfunction, and pain.   ACTIVITY LIMITATIONS: carrying, lifting, bending, standing, squatting, stairs, transfers, dressing, and locomotion level  PARTICIPATION LIMITATIONS: meal prep, cleaning, shopping, community activity, and yard work  PERSONAL FACTORS: Fitness, Sex, Time since onset of injury/illness/exacerbation, and 3+ comorbidities: PMH consists of the following: CP, depression, GAD, migraine headache, hx of LE surgery bilateral x2 in 2008  are also affecting patient's functional outcome.   REHAB POTENTIAL: Good  CLINICAL DECISION MAKING: Evolving/moderate  complexity  EVALUATION COMPLEXITY: Moderate  PLAN:  PT FREQUENCY: 1-2x/week  PT DURATION: 12 weeks  PLANNED INTERVENTIONS: Therapeutic exercises, Therapeutic activity, Neuromuscular re-education, Balance training, Gait training, Patient/Family education, Self Care, Joint mobilization, Stair training, Vestibular training, Canalith repositioning, Visual/preceptual remediation/compensation, Orthotic/Fit training, DME instructions, Dry Needling, Electrical stimulation, Wheelchair mobility training, Spinal mobilization, Cryotherapy, Moist heat, Splintting, Taping, Biofeedback, Manual therapy, and Re-evaluation  PLAN FOR NEXT SESSION: , training with walker/transfer (how to fold walker, get in/out of car), training and uneven surfaces, LE strengthening, gait exercises/step-length/gait-speed    Baird Kay, PT 02/06/2023, 9:47 AM  9:47 AM, 02/06/23 Physical Therapist - [facility]  [phone] [facility])

## 2023-01-29 ENCOUNTER — Ambulatory Visit: Payer: Medicaid Other

## 2023-02-05 ENCOUNTER — Ambulatory Visit: Payer: Medicaid Other

## 2023-02-05 DIAGNOSIS — M6281 Muscle weakness (generalized): Secondary | ICD-10-CM

## 2023-02-05 DIAGNOSIS — R262 Difficulty in walking, not elsewhere classified: Secondary | ICD-10-CM

## 2023-02-05 DIAGNOSIS — R2681 Unsteadiness on feet: Secondary | ICD-10-CM

## 2023-02-05 DIAGNOSIS — R278 Other lack of coordination: Secondary | ICD-10-CM

## 2023-02-05 DIAGNOSIS — M25661 Stiffness of right knee, not elsewhere classified: Secondary | ICD-10-CM

## 2023-02-05 DIAGNOSIS — M25662 Stiffness of left knee, not elsewhere classified: Secondary | ICD-10-CM

## 2023-02-05 NOTE — Therapy (Signed)
OUTPATIENT PHYSICAL THERAPY NEURO TREATMENT NOTE   Patient Name: Renee Walton MRN: [id number] DOB:[birth year], 27 y.o., female Today's Date: 02/05/2023   PCP: [name], MD REFERRING PROVIDER:   [name], MD    END OF SESSION:  PT End of Session - 02/05/23 1520     Visit Number 9    Number of Visits 25    Date for PT Re-Evaluation 01/14/23    Authorization Type Medicaid [place] Access    PT Start Time 1424    PT Stop Time 1509    PT Time Calculation (min) 45 min    Equipment Utilized During Treatment Gait belt    Activity Tolerance Patient tolerated treatment well;No increased pain    Behavior During Therapy WFL for tasks assessed/performed               Past Medical History:  Diagnosis Date   Breakthrough bleeding    Cerebral palsy    GAD (generalized anxiety disorder)    Migraine headache    Past Surgical History:  Procedure Laterality Date   LEG SURGERY     bilateral x2 in 2008   Patient Active Problem List   Diagnosis Date Noted   Loss of weight 07/17/2015   Depression 07/17/2015   Cerebral palsy 07/17/2015    ONSET DATE: [birth year]1994 pt has CP  REFERRING DIAG:   G80.9 (ICD-10-CM) - Cerebral palsy (HCC)    THERAPY DIAG:  Joint stiffness of left lower leg  Joint stiffness of right lower leg  Muscle weakness (generalized)  Other lack of coordination  Difficulty in walking, not elsewhere classified  Unsteadiness on feet  Rationale for Evaluation and Treatment: Rehabilitation  SUBJECTIVE:                                                                                                                                                                                             SUBJECTIVE STATEMENT. Pt reports no falls. She walked without her walker at home, reports no falls.  Pt reports her LE pain is about the same. She reports this worsens when she goes to the bathroom.  Pt has doctor appt next week.  She feels her independence has  increased since using RW for majority of activities.    Pt accompanied by: Mom  PERTINENT HISTORY:    Pt is a pleasant 27 y/o female with CP who is returning to PT after absence and trip to [place]. She would like to improve her mobility, gait and balance. Pt has continued her PT exercises while away and worked on gait, and wants to become independent using a RW. She  reports 2 recent falls, and no injuries. Pt also with stiffness in BLEs. PMH consists of the following: CP, depression, GAD, migraine headache, hx of LE surgery bilateral x2 in 2008.    PAIN: from eval Are you having pain? No  PRECAUTIONS: Fall  WEIGHT BEARING RESTRICTIONS: No  FALLS: Has patient fallen in last 6 months? Yes. Number of falls 2 when walking. She reports no injury. She got back up independently   PATIENT GOALS: Pt would like to improve her ability to ambulate with RW both inside and over outside surfaces in order to increase her independence with mobility. She would like to learn how to safely get in and out of a car with her RW.  OBJECTIVE:     INTERVENTION 02/05/23   Discussion regarding benefits of trying trigger release folding RW for ease/increased independence with folding walker (pt not able to independently fold her current RW). Pt agreeable to plan, PT to send form to pt's physician  STS 2x10, 1x15 hands-free   Standing at RW 1.5# AW donned soccer kicks into cones 10x alternating LE -advanced to 4# AW each LE x multiple reps kicking soccer ball into cones. Comments: for LE strengthening/sequencing, and to address pt's goal of playing soccer with her nephews    Manual:  On large mat table: PT provides long duration stretching for the following LE musculature- Bilateral adductuors, bilateral hamstrings, bilateral gastrocs PROM abd/adduct 10x each LE, PF/DF 10x each LE    No pain with interventions   PATIENT EDUCATION: Education details: Pt educated throughout session about proper  posture and technique with exercises. Improved exercise technique, movement at target joints, use of target muscles after min to mod verbal, visual, tactile cues.    Person educated: Patient and Parent Education method: Explanation, Demonstration, and Verbal cues Education comprehension: verbalized understanding and returned demonstration  HOME EXERCISE PROGRAM   01/22/2023: Access Code: [email or web address] URL: [email or web address] Date: 01/22/2023 Prepared by: Temple Pacini  Exercises - Supine Bridge  - 1 x daily - 5 x weekly - 3 sets - 10 reps - Supine Straight Leg Raise  - 1 x daily - 5 x weekly - 2 sets - 10 reps - Seated Hip Abduction with Resistance  - 1 x daily - 5 x weekly - 2 sets - 10 reps  11/21/2022: Access Code: [email or web address] URL: [email or web address] Date: 11/21/2022 Prepared by: Temple Pacini  Exercises *corrected on handout (written) perform the following with UE support on counter - Standing Gastroc Stretch on Foam 1/2 Roll  - 1 x daily - 7 x weekly - 2 sets - 2 reps - 30 seconds hold - Standing Hip Flexor Stretch  - 1 x daily - 7 x weekly - 2 sets - 2 reps - 30 seconds hold  GOALS: Goals reviewed with patient? Yes  SHORT TERM GOALS: Target date: 12/03/2022  Patient will be independent in home exercise program to improve strength/mobility for better functional independence with ADLs. Baseline: to be initiated next 1-2 visits Goal status: INITIAL  LONG TERM GOALS: Target date: 01/14/2023  Patient will increase FOTO score to equal to or greater than  76  to demonstrate statistically significant improvement in mobility and quality of life.  Baseline: risk adjusted 51 Goal status: INITIAL  2.  Pt will reduce 5xsSTS time to < 8 sec to demonstrate clinically significant improvement in LE strength and balance to reduce risk of falls.  Baseline: previously 12.76 sec on 06/10/22, now 10 sec to RW on 10/22/2022 Goal status: INITIAL  3.  Pt will improve 6 MWT  to > 1000' with no AD and no LOB to demonstrate ability to improve safety with community distances. Baseline: was 822 ft back in August 2023, now 808 with RW, CGA Goal status: INITIAL  4.  Pt will improve 10 m gait speed with LRAD to > 1.0 m/s to demonstrate reduced risk of falls along with safe ability to complete community walking tasks Baseline: previously 0.7 m/s on 06/10/2022, now 0.77 m/s with RW Goal status: INITIAL  5.  Patient will increase Berg Balance score by > 6 points to demonstrate decreased fall risk during functional activities.  Baseline: previously 35/56 on 03/11/22(?), test deferred to next appt; 11/13/2022: 36/56 Goal status: INITIAL   ASSESSMENT:  CLINICAL IMPRESSION: Pt with continued LLE pain/stiffness. She does exhibit increased tone today in LLE when PT providing LE stretches. She did report feeling as if LLE was swollen presenting to session. PT assessed LLE found no swelling, skin appearance and temperature WNL, not TTP. PT did instruct pt to call her physician immediately should she develop redness and/or swelling LLE. Pt verbalized understanding. Pt also with difficulty performing supine and seated therex. Updated HEP to better address these deficits. The pt will continue to benefit from skilled PT services to address deficits and impairment identified in evaluation in order to maximize independence and safety in basic mobility required for performance of ADL, IADL, and leisure.     OBJECTIVE IMPAIRMENTS: Abnormal gait, decreased activity tolerance, decreased balance, decreased coordination, decreased endurance, decreased knowledge of use of DME, decreased mobility, difficulty walking, decreased strength, impaired tone, improper body mechanics, postural dysfunction, and pain.   ACTIVITY LIMITATIONS: carrying, lifting, bending, standing, squatting, stairs, transfers, dressing, and locomotion level  PARTICIPATION LIMITATIONS: meal prep, cleaning, shopping, community  activity, and yard work  PERSONAL FACTORS: Fitness, Sex, Time since onset of injury/illness/exacerbation, and 3+ comorbidities: PMH consists of the following: CP, depression, GAD, migraine headache, hx of LE surgery bilateral x2 in 2008  are also affecting patient's functional outcome.   REHAB POTENTIAL: Good  CLINICAL DECISION MAKING: Evolving/moderate complexity  EVALUATION COMPLEXITY: Moderate  PLAN:  PT FREQUENCY: 1-2x/week  PT DURATION: 12 weeks  PLANNED INTERVENTIONS: Therapeutic exercises, Therapeutic activity, Neuromuscular re-education, Balance training, Gait training, Patient/Family education, Self Care, Joint mobilization, Stair training, Vestibular training, Canalith repositioning, Visual/preceptual remediation/compensation, Orthotic/Fit training, DME instructions, Dry Needling, Electrical stimulation, Wheelchair mobility training, Spinal mobilization, Cryotherapy, Moist heat, Splintting, Taping, Biofeedback, Manual therapy, and Re-evaluation  PLAN FOR NEXT SESSION: , training with walker/transfer (how to fold walker, get in/out of car), training and uneven surfaces, LE strengthening, gait exercises/step-length/gait-speed, stretching    Baird Kay, PT 02/05/2023, 3:21 PM  3:21 PM, 02/05/23 Physical Therapist - [facility]  [phone] [facility])

## 2023-02-06 NOTE — Addendum Note (Signed)
Addended by: Baird Kay on: 02/06/2023 09:50 AM   Modules accepted: Orders

## 2023-02-12 ENCOUNTER — Ambulatory Visit: Payer: Medicaid Other

## 2023-02-19 ENCOUNTER — Ambulatory Visit: Payer: Medicaid Other | Attending: Internal Medicine

## 2023-02-19 DIAGNOSIS — R262 Difficulty in walking, not elsewhere classified: Secondary | ICD-10-CM

## 2023-02-19 DIAGNOSIS — R2681 Unsteadiness on feet: Secondary | ICD-10-CM

## 2023-02-19 DIAGNOSIS — R278 Other lack of coordination: Secondary | ICD-10-CM | POA: Diagnosis present

## 2023-02-19 DIAGNOSIS — M6281 Muscle weakness (generalized): Secondary | ICD-10-CM | POA: Diagnosis present

## 2023-02-19 DIAGNOSIS — M25661 Stiffness of right knee, not elsewhere classified: Secondary | ICD-10-CM | POA: Diagnosis present

## 2023-02-19 DIAGNOSIS — M25662 Stiffness of left knee, not elsewhere classified: Secondary | ICD-10-CM | POA: Diagnosis present

## 2023-02-19 DIAGNOSIS — M79605 Pain in left leg: Secondary | ICD-10-CM | POA: Diagnosis present

## 2023-02-19 NOTE — Therapy (Signed)
OUTPATIENT PHYSICAL THERAPY NEURO TREATMENT NOTE/Physical Therapy Progress Note   Dates of reporting period  10/22/2022   to   02/19/2023    Patient Name: Renee Walton MRN: [id number] DOB:[birth year], 27 y.o., female Today's Date: 02/19/2023   PCP: [name], MD REFERRING PROVIDER:   [name], MD    END OF SESSION:  PT End of Session - 02/19/23 1705     Visit Number 10    Number of Visits 25    Date for PT Re-Evaluation 01/14/23    Authorization Type Medicaid  Access    PT Start Time 1433    PT Stop Time 1514    PT Time Calculation (min) 41 min    Equipment Utilized During Treatment Gait belt    Activity Tolerance Patient tolerated treatment well;No increased pain    Behavior During Therapy WFL for tasks assessed/performed                Past Medical History:  Diagnosis Date   Breakthrough bleeding    Cerebral palsy (HCC)    GAD (generalized anxiety disorder)    Migraine headache    Past Surgical History:  Procedure Laterality Date   LEG SURGERY     bilateral x2 in 2008   Patient Active Problem List   Diagnosis Date Noted   Loss of weight 07/17/2015   Depression 07/17/2015   Cerebral palsy (HCC) 07/17/2015    ONSET DATE: 04/12[birth year]94 pt has CP  REFERRING DIAG:   G80.9 (ICD-10-CM) - Cerebral palsy (HCC)    THERAPY DIAG:  Difficulty in walking, not elsewhere classified  Muscle weakness (generalized)  Unsteadiness on feet  Joint stiffness of left lower leg  Joint stiffness of right lower leg  Other lack of coordination  Rationale for Evaluation and Treatment: Rehabilitation  SUBJECTIVE:                                                                                                                                                                                             SUBJECTIVE STATEMENT. Pt reports continued L inner thigh pain, she says this is limiting her ability to climb the stairs. PT encourages pt to follow-up with  her ortho doc, pt agreeable to this plan. Pt reports no stumbles/falls. She is picking up new walker later.   Pt accompanied by: Mom  PERTINENT HISTORY:    Pt is a pleasant 27 y/o female with CP who is returning to PT after absence and trip to [place]. She would like to improve her mobility, gait and balance. Pt has continued her PT exercises while away and worked on gait, and  wants to become independent using a RW. She reports 2 recent falls, and no injuries. Pt also with stiffness in BLEs. PMH consists of the following: CP, depression, GAD, migraine headache, hx of LE surgery bilateral x2 in 2008.    PAIN: from eval Are you having pain? No  PRECAUTIONS: Fall  WEIGHT BEARING RESTRICTIONS: No  FALLS: Has patient fallen in last 6 months? Yes. Number of falls 2 when walking. She reports no injury. She got back up independently   PATIENT GOALS: Pt would like to improve her ability to ambulate with RW both inside and over outside surfaces in order to increase her independence with mobility. She would like to learn how to safely get in and out of a car with her RW.  OBJECTIVE:     INTERVENTION 02/19/23  TA: Goals reassessed for progress note on this date. Refer to goal section below for details.  Other interventions completed this date: Alternating step-ups/down 1st and 2nd stair step x multiple reps. Cuing to increase step-height, pt exhibits decreased obstacle clearance    No pain with interventions   PATIENT EDUCATION: Education details: Pt educated throughout session about proper posture and technique with exercises. Improved exercise technique, movement at target joints, use of target muscles after min to mod verbal, visual, tactile cues.    Person educated: Patient and Parent Education method: Explanation, Demonstration, and Verbal cues Education comprehension: verbalized understanding and returned demonstration  HOME EXERCISE PROGRAM   01/22/2023: Access Code:  [email or web address] URL: [email or web address] Date: 01/22/2023 Prepared by: Temple Pacini  Exercises - Supine Bridge  - 1 x daily - 5 x weekly - 3 sets - 10 reps - Supine Straight Leg Raise  - 1 x daily - 5 x weekly - 2 sets - 10 reps - Seated Hip Abduction with Resistance  - 1 x daily - 5 x weekly - 2 sets - 10 reps  11/21/2022: Access Code: [email or web address] URL: [email or web address] Date: 11/21/2022 Prepared by: Temple Pacini  Exercises *corrected on handout (written) perform the following with UE support on counter - Standing Gastroc Stretch on Foam 1/2 Roll  - 1 x daily - 7 x weekly - 2 sets - 2 reps - 30 seconds hold - Standing Hip Flexor Stretch  - 1 x daily - 7 x weekly - 2 sets - 2 reps - 30 seconds hold  GOALS: Goals reviewed with patient? Yes  SHORT TERM GOALS: Target date: 12/03/2022  Patient will be independent in home exercise program to improve strength/mobility for better functional independence with ADLs. Baseline: to be initiated next 1-2 visits; 02/19/2023: pt not consistently performing HEP Goal status: IN PROGRESS  LONG TERM GOALS: Target date: 01/14/2023  Patient will increase FOTO score to equal to or greater than  76  to demonstrate statistically significant improvement in mobility and quality of life.  Baseline: risk adjusted 51; 02/19/2023: 63 * pt reporting pain in left leg limiting stair climbing Goal status: IN PROGRESS  2.  Pt will reduce 5xsSTS time to < 8 sec to demonstrate clinically significant improvement in LE strength and balance to reduce risk of falls.  Baseline: previously 12.76 sec on 06/10/22, now 10 sec to RW on 10/22/2022; 02/19/2023 8.5 seconds hands-free Goal status: Partially MET  3.  Pt will improve 6 MWT to > 1000' with no AD and no LOB to demonstrate ability to improve safety with community distances. Baseline: was 822 ft back in August 2023, now 808 with RW, CGA; 02/19/2023 845 ft with 4WW Goal  status: PARTIALLY MET  4.  Pt will  improve 10 m gait speed with LRAD to > 1.0 m/s to demonstrate reduced risk of falls along with safe ability to complete community walking tasks Baseline: previously 0.7 m/s on 06/10/2022, now 0.77 m/s with RW; 02/19/2023: 0.82 m/s with RW  Goal status: IN PROGRESS  5.  Patient will increase Berg Balance score by > 6 points to demonstrate decreased fall risk during functional activities.  Baseline: previously 35/56 on 03/11/22(?), test deferred to next appt; 11/13/2022: 36/56; 02/19/2023: 39/56  Goal status: Partially MET   ASSESSMENT:  CLINICAL IMPRESSION: Goals reassessed for progress note. Pt making gains AEB partially meeting , 5xSTS and BERG goals, and showing improvement on . These improvements indicate increased gait capacity/speed, LE strength, balance and slight decrease in fall risk. Pt only saw decrease on FOTO score, which indicates decreased perception of functional mobility, however, pt reports feeling overall increase in independence and function. The pt will continue to benefit from skilled PT services to address deficits and impairment identified in evaluation in order to maximize independence and safety in basic mobility required for performance of ADL, IADL, and leisure.     OBJECTIVE IMPAIRMENTS: Abnormal gait, decreased activity tolerance, decreased balance, decreased coordination, decreased endurance, decreased knowledge of use of DME, decreased mobility, difficulty walking, decreased strength, impaired tone, improper body mechanics, postural dysfunction, and pain.   ACTIVITY LIMITATIONS: carrying, lifting, bending, standing, squatting, stairs, transfers, dressing, and locomotion level  PARTICIPATION LIMITATIONS: meal prep, cleaning, shopping, community activity, and yard work  PERSONAL FACTORS: Fitness, Sex, Time since onset of injury/illness/exacerbation, and 3+ comorbidities: PMH consists of the following: CP, depression, GAD, migraine headache, hx of LE surgery  bilateral x2 in 2008  are also affecting patient's functional outcome.   REHAB POTENTIAL: Good  CLINICAL DECISION MAKING: Evolving/moderate complexity  EVALUATION COMPLEXITY: Moderate  PLAN:  PT FREQUENCY: 1-2x/week  PT DURATION: 12 weeks  PLANNED INTERVENTIONS: Therapeutic exercises, Therapeutic activity, Neuromuscular re-education, Balance training, Gait training, Patient/Family education, Self Care, Joint mobilization, Stair training, Vestibular training, Canalith repositioning, Visual/preceptual remediation/compensation, Orthotic/Fit training, DME instructions, Dry Needling, Electrical stimulation, Wheelchair mobility training, Spinal mobilization, Cryotherapy, Moist heat, Splintting, Taping, Biofeedback, Manual therapy, and Re-evaluation  PLAN FOR NEXT SESSION: , training with walker/transfer (how to fold walker, get in/out of car), training and uneven surfaces, LE strengthening, gait exercises/step-length/gait-speed, stretching    Baird Kay, PT 02/19/2023, 5:17 PM  5:17 PM, 02/19/23 Physical Therapist - [facility]  [phone] [facility])

## 2023-02-24 ENCOUNTER — Ambulatory Visit: Payer: Medicaid Other | Admitting: Internal Medicine

## 2023-02-24 ENCOUNTER — Encounter: Payer: Self-pay | Admitting: Internal Medicine

## 2023-02-24 ENCOUNTER — Ambulatory Visit: Payer: Self-pay | Admitting: Physician Assistant

## 2023-02-24 VITALS — BP 128/70 | HR 89 | Ht 63.0 in | Wt 91.4 lb

## 2023-02-24 DIAGNOSIS — G43009 Migraine without aura, not intractable, without status migrainosus: Secondary | ICD-10-CM | POA: Diagnosis not present

## 2023-02-24 DIAGNOSIS — K219 Gastro-esophageal reflux disease without esophagitis: Secondary | ICD-10-CM

## 2023-02-24 DIAGNOSIS — G801 Spastic diplegic cerebral palsy: Secondary | ICD-10-CM | POA: Diagnosis not present

## 2023-02-24 DIAGNOSIS — F411 Generalized anxiety disorder: Secondary | ICD-10-CM | POA: Diagnosis not present

## 2023-02-24 NOTE — Progress Notes (Signed)
Established Patient Office Visit  Subjective:  Patient ID: Renee Walton, female    DOB: March 28, 1996  Age: 27 y.o. MRN: [id number]  Chief Complaint  Patient presents with   Follow-up    Pain in lower stomach    Patient comes in for her follow-up with her mother.  She has been getting regular physical therapy for her cerebral palsy.  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  She noted that her ambulation improved after the Botox injections but it did not last beyond  3-4 months.  Insurance only allows every 6 months.  She is looking forward to her next injections. Patient's mother notes that she is getting more anxious and gets hurt easily.  They think that she will benefit from psychotherapy regarding her condition.  Will send referral and set it up.  Meanwhile she is taking Lexapro.  No other complaints.    No other concerns at this time.   Past Medical History:  Diagnosis Date   Breakthrough bleeding    Cerebral palsy (HCC)    GAD (generalized anxiety disorder)    Migraine headache     Past Surgical History:  Procedure Laterality Date   LEG SURGERY     bilateral x2 in 2008    Social History   Socioeconomic History   Marital status: Single    Spouse name: Not on file   Number of children: Not on file   Years of education: Not on file   Highest education level: Not on file  Occupational History   Not on file  Tobacco Use   Smoking status: Never   Smokeless tobacco: Never  Vaping Use   Vaping Use: Never used  Substance and Sexual Activity   Alcohol use: No    Alcohol/week: 0.0 standard drinks of alcohol   Drug use: No   Sexual activity: Never  Other Topics Concern   Not on file  Social History Narrative   ** Merged History Encounter **       Social Determinants of Health   Financial Resource Strain: Not on file  Food Insecurity: Not on file  Transportation Needs: Not on file  Physical Activity: Not on file  Stress: Not on file  Social  Connections: Not on file  Intimate Partner Violence: Not on file    Family History  Problem Relation Age of Onset   Stroke Other        grandparent   Hypertension Other        parent   Mental illness Other        parent   Diabetes Other        parent, grandparent    Allergies  Allergen Reactions   Cat Hair Extract    Cat Hair Extract Itching    Review of Systems  Constitutional:  Negative for chills, diaphoresis, fever, malaise/fatigue and weight loss.  HENT: Negative.  Negative for hearing loss.   Eyes: Negative.   Respiratory: Negative.    Cardiovascular: Negative.   Gastrointestinal: Negative.   Genitourinary: Negative.   Musculoskeletal:  Positive for back pain and joint pain.  Neurological:  Positive for focal weakness and weakness. Negative for dizziness, tingling and tremors.  Psychiatric/Behavioral:  Positive for depression. Negative for hallucinations, memory loss, substance abuse and suicidal ideas. The patient is nervous/anxious. The patient does not have insomnia.        Objective:   BP 128/70   Pulse 89   Ht 5\' 3"  (1.6 m)  Wt 91 lb 6.4 oz (41.5 kg)   SpO2 99%   BMI 16.19 kg/m   Vitals:   02/24/23 1259  BP: 128/70  Pulse: 89  Height: 5\' 3"  (1.6 m)  Weight: 91 lb 6.4 oz (41.5 kg)  SpO2: 99%  BMI (Calculated): 16.19    Physical Exam Vitals and nursing note reviewed.  Cardiovascular:     Rate and Rhythm: Normal rate and regular rhythm.     Pulses: Normal pulses.     Heart sounds: Normal heart sounds.  Pulmonary:     Effort: Pulmonary effort is normal.     Breath sounds: Normal breath sounds.  Musculoskeletal:        General: Deformity present. No tenderness or signs of injury.     Cervical back: Normal range of motion and neck supple.     Right lower leg: No edema.     Left lower leg: No edema.  Skin:    General: Skin is warm.  Neurological:     Mental Status: She is alert. Mental status is at baseline.  Psychiatric:        Mood  and Affect: Mood normal.        Behavior: Behavior normal.      No results found for any visits on 02/24/23.  No results found for this or any previous visit (from the past 2160 hour(s)).    Assessment & Plan:  Psychologist referral set up patient will benefit from psychotherapy.  Meanwhile continue all her medications as well as rehab. Problem List Items Addressed This Visit     Cerebral palsy (HCC)   Gastroesophageal reflux disease without esophagitis   Migraine without aura and without status migrainosus, not intractable   GAD (generalized anxiety disorder) - Primary   Relevant Orders   Ambulatory referral to Pediatric Psychology    Return in about 4 months (around 06/27/2023).   Total time spent: 25 minutes  [name], MD  02/24/2023

## 2023-02-26 ENCOUNTER — Ambulatory Visit: Payer: Medicaid Other

## 2023-03-05 ENCOUNTER — Ambulatory Visit: Payer: Medicaid Other

## 2023-03-05 DIAGNOSIS — R262 Difficulty in walking, not elsewhere classified: Secondary | ICD-10-CM

## 2023-03-05 DIAGNOSIS — R278 Other lack of coordination: Secondary | ICD-10-CM

## 2023-03-05 DIAGNOSIS — M6281 Muscle weakness (generalized): Secondary | ICD-10-CM

## 2023-03-05 DIAGNOSIS — R2681 Unsteadiness on feet: Secondary | ICD-10-CM

## 2023-03-05 DIAGNOSIS — M25661 Stiffness of right knee, not elsewhere classified: Secondary | ICD-10-CM

## 2023-03-05 DIAGNOSIS — M25662 Stiffness of left knee, not elsewhere classified: Secondary | ICD-10-CM

## 2023-03-05 NOTE — Therapy (Signed)
OUTPATIENT PHYSICAL THERAPY NEURO TREATMENT NOTE   Patient Name: Renee Walton MRN: [id number] DOB:Jul 29, 1996, 27 y.o., female Today's Date: 03/05/2023   PCP: [name], MD REFERRING PROVIDER:   [name], MD    END OF SESSION:  PT End of Session - 03/05/23 1434     Visit Number 11    Number of Visits 25    Date for PT Re-Evaluation 04/16/23    Authorization Type Medicaid [place] Access    PT Start Time 1433    PT Stop Time 1513    PT Time Calculation (min) 40 min    Equipment Utilized During Treatment Gait belt    Activity Tolerance Patient tolerated treatment well;No increased pain    Behavior During Therapy WFL for tasks assessed/performed                Past Medical History:  Diagnosis Date   Breakthrough bleeding    Cerebral palsy (HCC)    GAD (generalized anxiety disorder)    Migraine headache    Past Surgical History:  Procedure Laterality Date   LEG SURGERY     bilateral x2 in 2008   Patient Active Problem List   Diagnosis Date Noted   Gastroesophageal reflux disease without esophagitis 02/24/2023   Migraine without aura and without status migrainosus, not intractable 02/24/2023   GAD (generalized anxiety disorder) 02/24/2023   Loss of weight 07/17/2015   Depression 07/17/2015   Cerebral palsy (HCC) 07/17/2015    ONSET DATE: [birth year]05[birth year] pt has CP  REFERRING DIAG:   G80.9 (ICD-10-CM) - Cerebral palsy (HCC)    THERAPY DIAG:  Other lack of coordination  Difficulty in walking, not elsewhere classified  Muscle weakness (generalized)  Unsteadiness on feet  Joint stiffness of left lower leg  Joint stiffness of right lower leg  Rationale for Evaluation and Treatment: Rehabilitation  SUBJECTIVE:                                                                                                                                                                                             SUBJECTIVE STATEMENT. Pt reports continued  constipation pain that she feels into her legs. Pt reports she is going to also see a therapist to address her anxiety/panic attacks. No other updates/concerns provided.   Pt accompanied by: Mom  PERTINENT HISTORY:    Pt is a pleasant 27 y/o female with CP who is returning to PT after absence and trip to [place]. She would like to improve her mobility, gait and balance. Pt has continued her PT exercises while away and worked on gait, and wants to become independent  using a RW. She reports 2 recent falls, and no injuries. Pt also with stiffness in BLEs. PMH consists of the following: CP, depression, GAD, migraine headache, hx of LE surgery bilateral x2 in 2008.    PAIN: from eval Are you having pain? No  PRECAUTIONS: Fall  WEIGHT BEARING RESTRICTIONS: No  FALLS: Has patient fallen in last 6 months? Yes. Number of falls 2 when walking. She reports no injury. She got back up independently   PATIENT GOALS: Pt would like to improve her ability to ambulate with RW both inside and over outside surfaces in order to increase her independence with mobility. She would like to learn how to safely get in and out of a car with her RW.  OBJECTIVE:     INTERVENTION 03/05/23   TA: Ascend/descend stairs with BUE support 3x both ways, close CGA, cuing throughout for increased step-height to prevent toe-catch on steps   TE: STS 1x12 -then completed 1x12, 1x5 more sets/reps with holding 2000 gr ball BUE  Standing at RW 1.5# AW donned each LE, soccer kicks into cones x multiple, cuing for amplitude of movement -Pt then performed seated x multiple reps with emphasis on RLE kicking for improved quad activation   1.5# LAQ 10x each LE with TC  Seated hamstring stretch with LE on 6" step for additional support - 2x30 sec each LE Pt reports LLE feels stiffer    PATIENT EDUCATION: Education details: Pt educated throughout session about proper posture and technique with exercises. Improved exercise  technique, movement at target joints, use of target muscles after min to mod verbal, visual, tactile cues.    Person educated: Patient and Parent Education method: Explanation, Demonstration, and Verbal cues Education comprehension: verbalized understanding and returned demonstration  HOME EXERCISE PROGRAM   01/22/2023: Access Code: [email or web address] URL: [email or web address] Date: 01/22/2023 Prepared by: Temple Pacini  Exercises - Supine Bridge  - 1 x daily - 5 x weekly - 3 sets - 10 reps - Supine Straight Leg Raise  - 1 x daily - 5 x weekly - 2 sets - 10 reps - Seated Hip Abduction with Resistance  - 1 x daily - 5 x weekly - 2 sets - 10 reps  11/21/2022: Access Code: [email or web address] URL: [email or web address] Date: 11/21/2022 Prepared by: Temple Pacini  Exercises *corrected on handout (written) perform the following with UE support on counter - Standing Gastroc Stretch on Foam 1/2 Roll  - 1 x daily - 7 x weekly - 2 sets - 2 reps - 30 seconds hold - Standing Hip Flexor Stretch  - 1 x daily - 7 x weekly - 2 sets - 2 reps - 30 seconds hold  GOALS: Goals reviewed with patient? Yes  SHORT TERM GOALS: Target date: 12/03/2022  Patient will be independent in home exercise program to improve strength/mobility for better functional independence with ADLs. Baseline: to be initiated next 1-2 visits; 02/19/2023: pt not consistently performing HEP Goal status: IN PROGRESS  LONG TERM GOALS: Target date: 01/14/2023  Patient will increase FOTO score to equal to or greater than  76  to demonstrate statistically significant improvement in mobility and quality of life.  Baseline: risk adjusted 51; 02/19/2023: 63 * pt reporting pain in left leg limiting stair climbing Goal status: IN PROGRESS  2.  Pt will reduce 5xsSTS time to < 8 sec to demonstrate clinically significant improvement in LE strength and balance to reduce risk of falls.  Baseline: previously 12.76 sec on 06/10/22, now 10 sec  to  RW on 10/22/2022; 02/19/2023 8.5 seconds hands-free Goal status: Partially MET  3.  Pt will improve 6 MWT to > 1000' with no AD and no LOB to demonstrate ability to improve safety with community distances. Baseline: was 822 ft back in August 2023, now 808 with RW, CGA; 02/19/2023 845 ft with 4WW Goal status: PARTIALLY MET  4.  Pt will improve 10 m gait speed with LRAD to > 1.0 m/s to demonstrate reduced risk of falls along with safe ability to complete community walking tasks Baseline: previously 0.7 m/s on 06/10/2022, now 0.77 m/s with RW; 02/19/2023: 0.82 m/s with RW  Goal status: IN PROGRESS  5.  Patient will increase Berg Balance score by > 6 points to demonstrate decreased fall risk during functional activities.  Baseline: previously 35/56 on 03/11/22(?), test deferred to next appt; 11/13/2022: 36/56; 02/19/2023: 39/56  Goal status: Partially MET   ASSESSMENT:  CLINICAL IMPRESSION: Pt with excellent motivation to participate in session. Start of session focused on stair navigation. Pt able to complete with BUE support, but observed to have some difficulty with foot clearance. Pt with difficulty correcting, will continue to practice. The pt will continue to benefit from skilled PT services to address deficits and impairment identified in evaluation in order to maximize independence and safety in basic mobility required for performance of ADL, IADL, and leisure.     OBJECTIVE IMPAIRMENTS: Abnormal gait, decreased activity tolerance, decreased balance, decreased coordination, decreased endurance, decreased knowledge of use of DME, decreased mobility, difficulty walking, decreased strength, impaired tone, improper body mechanics, postural dysfunction, and pain.   ACTIVITY LIMITATIONS: carrying, lifting, bending, standing, squatting, stairs, transfers, dressing, and locomotion level  PARTICIPATION LIMITATIONS: meal prep, cleaning, shopping, community activity, and yard work  PERSONAL FACTORS:  Fitness, Sex, Time since onset of injury/illness/exacerbation, and 3+ comorbidities: PMH consists of the following: CP, depression, GAD, migraine headache, hx of LE surgery bilateral x2 in 2008  are also affecting patient's functional outcome.   REHAB POTENTIAL: Good  CLINICAL DECISION MAKING: Evolving/moderate complexity  EVALUATION COMPLEXITY: Moderate  PLAN:  PT FREQUENCY: 1-2x/week  PT DURATION: 12 weeks  PLANNED INTERVENTIONS: Therapeutic exercises, Therapeutic activity, Neuromuscular re-education, Balance training, Gait training, Patient/Family education, Self Care, Joint mobilization, Stair training, Vestibular training, Canalith repositioning, Visual/preceptual remediation/compensation, Orthotic/Fit training, DME instructions, Dry Needling, Electrical stimulation, Wheelchair mobility training, Spinal mobilization, Cryotherapy, Moist heat, Splintting, Taping, Biofeedback, Manual therapy, and Re-evaluation  PLAN FOR NEXT SESSION: , training with walker/transfer (how to fold walker, get in/out of car), training and uneven surfaces, LE strengthening, gait exercises/step-length/gait-speed, stretching, stairs    Baird Kay, PT 03/05/2023, 3:36 PM  3:36 PM, 03/05/23 Physical Therapist - [facility]  [phone] [facility])

## 2023-03-12 ENCOUNTER — Ambulatory Visit: Payer: Medicaid Other

## 2023-03-12 DIAGNOSIS — M25662 Stiffness of left knee, not elsewhere classified: Secondary | ICD-10-CM

## 2023-03-12 DIAGNOSIS — R262 Difficulty in walking, not elsewhere classified: Secondary | ICD-10-CM

## 2023-03-12 DIAGNOSIS — M6281 Muscle weakness (generalized): Secondary | ICD-10-CM

## 2023-03-12 DIAGNOSIS — R278 Other lack of coordination: Secondary | ICD-10-CM

## 2023-03-12 DIAGNOSIS — M25661 Stiffness of right knee, not elsewhere classified: Secondary | ICD-10-CM

## 2023-03-12 NOTE — Therapy (Signed)
OUTPATIENT PHYSICAL THERAPY NEURO TREATMENT NOTE   Patient Name: Renee Walton MRN: [id number] DOB:November 01, 1995, 27 y.o., female Today's Date: 03/12/2023   PCP: [name], MD REFERRING PROVIDER:   [name], MD    END OF SESSION:  PT End of Session - 03/12/23 1414     Visit Number 12    Number of Visits 25    Date for PT Re-Evaluation 04/16/23    Authorization Type Medicaid  Access    PT Start Time 1423    PT Stop Time 1505    PT Time Calculation (min) 42 min    Equipment Utilized During Treatment Gait belt    Activity Tolerance Patient tolerated treatment well;No increased pain    Behavior During Therapy WFL for tasks assessed/performed                Past Medical History:  Diagnosis Date   Breakthrough bleeding    Cerebral palsy (HCC)    GAD (generalized anxiety disorder)    Migraine headache    Past Surgical History:  Procedure Laterality Date   LEG SURGERY     bilateral x2 in 2008   Patient Active Problem List   Diagnosis Date Noted   Gastroesophageal reflux disease without esophagitis 02/24/2023   Migraine without aura and without status migrainosus, not intractable 02/24/2023   GAD (generalized anxiety disorder) 02/24/2023   Loss of weight 07/17/2015   Depression 07/17/2015   Cerebral palsy (HCC) 07/17/2015    ONSET DATE: 1994[birth year] pt has CP  REFERRING DIAG:   G80.9 (ICD-10-CM) - Cerebral palsy (HCC)    THERAPY DIAG:  Other lack of coordination  Difficulty in walking, not elsewhere classified  Muscle weakness (generalized)  Joint stiffness of right lower leg  Joint stiffness of left lower leg  Rationale for Evaluation and Treatment: Rehabilitation  SUBJECTIVE:                                                                                                                                                                                             SUBJECTIVE STATEMENT. Pt presents with new RW. She reports she is  walking indep with it. She reports no pain at the moment. Pt reports she attempted tandem gait at home and fell, exhibits bruise on bilat knee. She reports this occurred trying to get to the bathroom without using her RW. PT provides education on importance of using RW in home as well to prevent falls.   Pt accompanied by: self  PERTINENT HISTORY:    Pt is a pleasant 27 y/o female with CP who is returning to PT after absence and  trip to [place]. She would like to improve her mobility, gait and balance. Pt has continued her PT exercises while away and worked on gait, and wants to become independent using a RW. She reports 2 recent falls, and no injuries. Pt also with stiffness in BLEs. PMH consists of the following: CP, depression, GAD, migraine headache, hx of LE surgery bilateral x2 in 2008.    PAIN: from eval Are you having pain? No  PRECAUTIONS: Fall  WEIGHT BEARING RESTRICTIONS: No  FALLS: Has patient fallen in last 6 months? Yes. Number of falls 2 when walking. She reports no injury. She got back up independently   PATIENT GOALS: Pt would like to improve her ability to ambulate with RW both inside and over outside surfaces in order to increase her independence with mobility. She would like to learn how to safely get in and out of a car with her RW.  OBJECTIVE:     INTERVENTION 03/12/23   TA: Practice folding RW and placing it in and out of car simulation (lifting on and off of table of approx 2 ft in height). PT provides demo, cuing for technique with lifting, folding RW. Pt able to lift/fold RW from ground to table 2x each way. Provided education in CP support groups, provided pt with number of CP organization (printout)  TE: RTB bicep curls 2x15 BUE RTB shrugs 20x BUE  Comments: challenging for pt, difficulty with technique. Performed to improve UE strength for increased ease with folding and lifting her RW.  STS 1x15 hands-free   Manual: On mat table: supine PROM  long-duration stretching provided for bilat knee-to-chest, hip adductors stretch bilat, and hamstring stretch bilat. No pain throughout.   Pt takes bathroom break x 5 min (unbilled)    PATIENT EDUCATION: Education details: Pt educated throughout session about proper posture and technique with exercises. Improved exercise technique, movement at target joints, use of target muscles after min to mod verbal, visual, tactile cues.    Person educated: Patient and Parent Education method: Explanation, Demonstration, and Verbal cues Education comprehension: verbalized understanding and returned demonstration  HOME EXERCISE PROGRAM   01/22/2023: Access Code: [email or web address] URL: [email or web address] Date: 01/22/2023 Prepared by: Temple Pacini  Exercises - Supine Bridge  - 1 x daily - 5 x weekly - 3 sets - 10 reps - Supine Straight Leg Raise  - 1 x daily - 5 x weekly - 2 sets - 10 reps - Seated Hip Abduction with Resistance  - 1 x daily - 5 x weekly - 2 sets - 10 reps  11/21/2022: Access Code: [email or web address] URL: [email or web address] Date: 11/21/2022 Prepared by: Temple Pacini  Exercises *corrected on handout (written) perform the following with UE support on counter - Standing Gastroc Stretch on Foam 1/2 Roll  - 1 x daily - 7 x weekly - 2 sets - 2 reps - 30 seconds hold - Standing Hip Flexor Stretch  - 1 x daily - 7 x weekly - 2 sets - 2 reps - 30 seconds hold  GOALS: Goals reviewed with patient? Yes  SHORT TERM GOALS: Target date: 12/03/2022  Patient will be independent in home exercise program to improve strength/mobility for better functional independence with ADLs. Baseline: to be initiated next 1-2 visits; 02/19/2023: pt not consistently performing HEP Goal status: IN PROGRESS  LONG TERM GOALS: Target date: 01/14/2023  Patient will increase FOTO score to equal to or greater than  76  to demonstrate statistically significant improvement in mobility and quality of  life.  Baseline: risk adjusted 51; 02/19/2023: 63 * pt reporting pain in left leg limiting stair climbing Goal status: IN PROGRESS  2.  Pt will reduce 5xsSTS time to < 8 sec to demonstrate clinically significant improvement in LE strength and balance to reduce risk of falls.  Baseline: previously 12.76 sec on 06/10/22, now 10 sec to RW on 10/22/2022; 02/19/2023 8.5 seconds hands-free Goal status: Partially MET  3.  Pt will improve 6 MWT to > 1000' with no AD and no LOB to demonstrate ability to improve safety with community distances. Baseline: was 822 ft back in August 2023, now 808 with RW, CGA; 02/19/2023 845 ft with 4WW Goal status: PARTIALLY MET  4.  Pt will improve 10 m gait speed with LRAD to > 1.0 m/s to demonstrate reduced risk of falls along with safe ability to complete community walking tasks Baseline: previously 0.7 m/s on 06/10/2022, now 0.77 m/s with RW; 02/19/2023: 0.82 m/s with RW  Goal status: IN PROGRESS  5.  Patient will increase Berg Balance score by > 6 points to demonstrate decreased fall risk during functional activities.  Baseline: previously 35/56 on 03/11/22(?), test deferred to next appt; 11/13/2022: 36/56; 02/19/2023: 39/56  Goal status: Partially MET   ASSESSMENT:  CLINICAL IMPRESSION: Pt presents with new RW and demonstrates ability to fold it independently. Pt did report to PT she still is not able to indep lift into her car, so technique for this activity was reviewed in session. Will require further practice as pt had some difficulty with technique. The pt will continue to benefit from skilled PT services to address deficits and impairment identified in evaluation in order to maximize independence and safety in basic mobility required for performance of ADL, IADL, and leisure.     OBJECTIVE IMPAIRMENTS: Abnormal gait, decreased activity tolerance, decreased balance, decreased coordination, decreased endurance, decreased knowledge of use of DME, decreased mobility,  difficulty walking, decreased strength, impaired tone, improper body mechanics, postural dysfunction, and pain.   ACTIVITY LIMITATIONS: carrying, lifting, bending, standing, squatting, stairs, transfers, dressing, and locomotion level  PARTICIPATION LIMITATIONS: meal prep, cleaning, shopping, community activity, and yard work  PERSONAL FACTORS: Fitness, Sex, Time since onset of injury/illness/exacerbation, and 3+ comorbidities: PMH consists of the following: CP, depression, GAD, migraine headache, hx of LE surgery bilateral x2 in 2008  are also affecting patient's functional outcome.   REHAB POTENTIAL: Good  CLINICAL DECISION MAKING: Evolving/moderate complexity  EVALUATION COMPLEXITY: Moderate  PLAN:  PT FREQUENCY: 1-2x/week  PT DURATION: 12 weeks  PLANNED INTERVENTIONS: Therapeutic exercises, Therapeutic activity, Neuromuscular re-education, Balance training, Gait training, Patient/Family education, Self Care, Joint mobilization, Stair training, Vestibular training, Canalith repositioning, Visual/preceptual remediation/compensation, Orthotic/Fit training, DME instructions, Dry Needling, Electrical stimulation, Wheelchair mobility training, Spinal mobilization, Cryotherapy, Moist heat, Splintting, Taping, Biofeedback, Manual therapy, and Re-evaluation  PLAN FOR NEXT SESSION: , training with walker/transfer (how to fold walker, get in/out of car), training and uneven surfaces, LE strengthening, gait exercises/step-length/gait-speed, stretching, stairs, [employer] exercises    Baird Kay, PT 03/12/2023, 7:19 PM  7:19 PM, 03/12/23 Physical Therapist - [facility]  [phone] [facility])

## 2023-03-13 ENCOUNTER — Ambulatory Visit: Payer: Self-pay | Admitting: Family Medicine

## 2023-03-13 ENCOUNTER — Ambulatory Visit: Payer: Medicaid Other | Admitting: Internal Medicine

## 2023-03-13 ENCOUNTER — Encounter: Payer: Self-pay | Admitting: Internal Medicine

## 2023-03-13 VITALS — BP 120/90 | HR 103 | Ht 62.0 in | Wt 89.2 lb

## 2023-03-13 DIAGNOSIS — K219 Gastro-esophageal reflux disease without esophagitis: Secondary | ICD-10-CM

## 2023-03-13 DIAGNOSIS — J209 Acute bronchitis, unspecified: Secondary | ICD-10-CM

## 2023-03-13 DIAGNOSIS — F411 Generalized anxiety disorder: Secondary | ICD-10-CM

## 2023-03-13 DIAGNOSIS — G801 Spastic diplegic cerebral palsy: Secondary | ICD-10-CM | POA: Diagnosis not present

## 2023-03-13 DIAGNOSIS — Z20822 Contact with and (suspected) exposure to covid-19: Secondary | ICD-10-CM

## 2023-03-13 DIAGNOSIS — J40 Bronchitis, not specified as acute or chronic: Secondary | ICD-10-CM | POA: Insufficient documentation

## 2023-03-13 LAB — POCT XPERT XPRESS SARS COVID-2/FLU/RSV
FLU A: NEGATIVE
FLU B: NEGATIVE
RSV RNA, PCR: NEGATIVE
SARS Coronavirus 2: NEGATIVE

## 2023-03-13 MED ORDER — AZITHROMYCIN 250 MG PO TABS: ORAL_TABLET | ORAL | 0 refills | Status: AC

## 2023-03-13 NOTE — Progress Notes (Signed)
Established Patient Office Visit  Subjective:  Patient ID: Renee Walton, female    DOB: 12/22/1995  Age: 27 y.o. MRN: [id number]  Chief Complaint  Patient presents with   Follow-up    4 month follow up    Patient comes in with 5-day history of cough sore throat, headache, chills, body aches and cough.  Today she reports that she was producing sputum which was slightly colored.  Patient has been feeling very weak and tired.  Nobody else in the house  has similar symptoms. COVID/flu/RSV done in the office is negative. Will start Z-Pak and advised to use Mucinex, rest, fluids and Tylenol as needed.    No other concerns at this time.   Past Medical History:  Diagnosis Date   Breakthrough bleeding    Cerebral palsy (HCC)    GAD (generalized anxiety disorder)    Migraine headache     Past Surgical History:  Procedure Laterality Date   LEG SURGERY     bilateral x2 in 2008    Social History   Socioeconomic History   Marital status: Single    Spouse name: Not on file   Number of children: Not on file   Years of education: Not on file   Highest education level: Not on file  Occupational History   Not on file  Tobacco Use   Smoking status: Never   Smokeless tobacco: Never  Vaping Use   Vaping Use: Never used  Substance and Sexual Activity   Alcohol use: No    Alcohol/week: 0.0 standard drinks of alcohol   Drug use: No   Sexual activity: Never  Other Topics Concern   Not on file  Social History Narrative   ** Merged History Encounter **       Social Determinants of Health   Financial Resource Strain: Not on file  Food Insecurity: Not on file  Transportation Needs: Not on file  Physical Activity: Not on file  Stress: Not on file  Social Connections: Not on file  Intimate Partner Violence: Not on file    Family History  Problem Relation Age of Onset   Stroke Other        grandparent   Hypertension Other        parent   Mental illness Other         parent   Diabetes Other        parent, grandparent    Allergies  Allergen Reactions   Cat Hair Extract    Cat Hair Extract Itching    Review of Systems  Constitutional:  Positive for chills and malaise/fatigue. Negative for diaphoresis, fever and weight loss.  HENT:  Positive for congestion, ear pain, sinus pain and sore throat. Negative for ear discharge and nosebleeds.   Eyes:  Negative for blurred vision, double vision, photophobia, pain, discharge and redness.  Respiratory:  Positive for cough and sputum production. Negative for shortness of breath and wheezing.   Cardiovascular:  Negative for chest pain, leg swelling and PND.  Gastrointestinal:  Negative for abdominal pain, blood in stool, diarrhea, heartburn, melena, nausea and vomiting.  Genitourinary:  Negative for dysuria, hematuria and urgency.  Musculoskeletal:  Negative for back pain, falls, myalgias and neck pain.  Neurological:  Positive for weakness and headaches. Negative for dizziness and seizures.  Psychiatric/Behavioral:  Negative for depression. The patient is nervous/anxious.        Objective:   BP (!) 120/90   Pulse (!) 103   Ht 5'  2" (1.575 m)   Wt 89 lb 3.2 oz (40.5 kg)   SpO2 98%   BMI 16.31 kg/m   Vitals:   03/13/23 1302  BP: (!) 120/90  Pulse: (!) 103  Height: 5\' 2"  (1.575 m)  Weight: 89 lb 3.2 oz (40.5 kg)  SpO2: 98%  BMI (Calculated): 16.31    Physical Exam Vitals and nursing note reviewed.  Constitutional:      Appearance: Normal appearance.  HENT:     Right Ear: A middle ear effusion is present. Tympanic membrane is injected.     Left Ear: A middle ear effusion is present. Tympanic membrane is injected.     Mouth/Throat:     Pharynx: Oropharynx is clear. No oropharyngeal exudate or posterior oropharyngeal erythema.     Tonsils: No tonsillar exudate.  Cardiovascular:     Rate and Rhythm: Normal rate and regular rhythm.     Pulses: Normal pulses.     Heart sounds: Normal heart  sounds. No murmur heard. Pulmonary:     Breath sounds: No wheezing, rhonchi or rales.  Abdominal:     General: Bowel sounds are normal. There is no distension.     Palpations: Abdomen is soft.     Tenderness: There is no abdominal tenderness. There is no right CVA tenderness, left CVA tenderness, guarding or rebound.  Musculoskeletal:     Cervical back: Normal range of motion and neck supple.  Lymphadenopathy:     Cervical: No cervical adenopathy.  Neurological:     General: No focal deficit present.     Mental Status: She is alert and oriented to person, place, and time.  Psychiatric:        Mood and Affect: Mood normal.        Behavior: Behavior normal.      Results for orders placed or performed in visit on 03/13/23  POCT XPERT XPRESS SARS COVID-2/FLU/RSV [[id number]]  Result Value Ref Range   SARS Coronavirus 2 negative    FLU A negative    FLU B negative    RSV RNA, PCR negative     Recent Results (from the past 2160 hour(s))  POCT XPERT XPRESS SARS COVID-2/FLU/RSV [[id number]]     Status: Normal   Collection Time: 03/13/23  3:40 PM  Result Value Ref Range   SARS Coronavirus 2 negative    FLU A negative    FLU B negative    RSV RNA, PCR negative       Assessment & Plan:  Patient will start Z-Pak.  Along with Mucinex.  Advised to continue her regular medications. Rest, fluids, Tylenol. Problem List Items Addressed This Visit     Cerebral palsy (HCC)   Gastroesophageal reflux disease without esophagitis   GAD (generalized anxiety disorder)   Bronchitis - Primary   Relevant Medications   azithromycin (ZITHROMAX Z-PAK) 250 MG tablet    Return in about 10 days (around 03/23/2023).   Total time spent: 25 minutes  [name], MD  03/13/2023   This document may have been prepared by [facility] Voice Recognition software and as such may include unintentional dictation errors.

## 2023-03-19 ENCOUNTER — Ambulatory Visit: Payer: Medicaid Other

## 2023-03-19 DIAGNOSIS — R278 Other lack of coordination: Secondary | ICD-10-CM

## 2023-03-19 DIAGNOSIS — R2681 Unsteadiness on feet: Secondary | ICD-10-CM

## 2023-03-19 DIAGNOSIS — M79605 Pain in left leg: Secondary | ICD-10-CM

## 2023-03-19 DIAGNOSIS — R262 Difficulty in walking, not elsewhere classified: Secondary | ICD-10-CM

## 2023-03-19 DIAGNOSIS — M6281 Muscle weakness (generalized): Secondary | ICD-10-CM

## 2023-03-19 DIAGNOSIS — M25662 Stiffness of left knee, not elsewhere classified: Secondary | ICD-10-CM

## 2023-03-19 DIAGNOSIS — M25661 Stiffness of right knee, not elsewhere classified: Secondary | ICD-10-CM

## 2023-03-19 NOTE — Therapy (Signed)
OUTPATIENT PHYSICAL THERAPY NEURO TREATMENT NOTE   Patient Name: Renee Walton MRN: [id number] DOB:[birth year], 27 y.o., female Today's Date: 03/19/2023   PCP: [name], MD REFERRING PROVIDER:   [name], MD    END OF SESSION:  PT End of Session - 03/19/23 1424     Visit Number 13    Number of Visits 25    Date for PT Re-Evaluation 04/16/23    Authorization Type Medicaid [place] Access    PT Start Time 1432    PT Stop Time 1512    PT Time Calculation (min) 40 min    Equipment Utilized During Treatment Gait belt    Activity Tolerance Patient tolerated treatment well    Behavior During Therapy WFL for tasks assessed/performed                Past Medical History:  Diagnosis Date   Breakthrough bleeding    Cerebral palsy (HCC)    GAD (generalized anxiety disorder)    Migraine headache    Past Surgical History:  Procedure Laterality Date   LEG SURGERY     bilateral x2 in 2008   Patient Active Problem List   Diagnosis Date Noted   Bronchitis 03/13/2023   Gastroesophageal reflux disease without esophagitis 02/24/2023   Migraine without aura and without status migrainosus, not intractable 02/24/2023   GAD (generalized anxiety disorder) 02/24/2023   Loss of weight 07/17/2015   Depression 07/17/2015   Cerebral palsy (HCC) 07/17/2015    ONSET DATE: 19-Aug-1994 pt has CP  REFERRING DIAG:   G80.9 (ICD-10-CM) - Cerebral palsy (HCC)    THERAPY DIAG:  Joint stiffness of left lower leg  Joint stiffness of right lower leg  Muscle weakness (generalized)  Other lack of coordination  Difficulty in walking, not elsewhere classified  Unsteadiness on feet  Pain in left leg  Rationale for Evaluation and Treatment: Rehabilitation  SUBJECTIVE:                                                                                                                                                                                             SUBJECTIVE  STATEMENT. Pt reports she had a bad anxiety attack the other day but is feeling better today. She says anxiety affects her ability to walk (pt working with doctor for anxiety). Pt reports LLE pain increased as a result. She continues to report constipation pain.   Pt accompanied by: self  PERTINENT HISTORY:    Pt is a pleasant 27 y/o female with CP who is returning to PT after absence and trip to [place]. She would like to improve her  mobility, gait and balance. Pt has continued her PT exercises while away and worked on gait, and wants to become independent using a RW. She reports 2 recent falls, and no injuries. Pt also with stiffness in BLEs. PMH consists of the following: CP, depression, GAD, migraine headache, hx of LE surgery bilateral x2 in 2008.    PAIN: from eval Are you having pain? No  PRECAUTIONS: Fall  WEIGHT BEARING RESTRICTIONS: No  FALLS: Has patient fallen in last 6 months? Yes. Number of falls 2 when walking. She reports no injury. She got back up independently   PATIENT GOALS: Pt would like to improve her ability to ambulate with RW both inside and over outside surfaces in order to increase her independence with mobility. She would like to learn how to safely get in and out of a car with her RW.  OBJECTIVE:     INTERVENTION 03/19/23  TE: RTB bicep curls 2x15 BUE STS 1x15 hands-free  STS 8x hands-free  On mat table: SLR 2x10, 1x5 each LE Bridge 1x10, 1x15 rates easy  March 2x10 each LE    Manual: On mat table: supine PROM long-duration stretching provided for bilat knee-to-chest, hip adductors stretch bilat, and hamstring* stretch bilat. *Pt reports feeling some increased LLE thigh pain with RLE hamstring stretch, RLE hamstring stretch terminated as a result     PATIENT EDUCATION: Education details: Pt educated throughout session about proper posture and technique with exercises. Improved exercise technique, movement at target joints, use of target muscles  after min to mod verbal, visual, tactile cues.    Person educated: Patient and Parent Education method: Explanation, Demonstration, and Verbal cues Education comprehension: verbalized understanding and returned demonstration  HOME EXERCISE PROGRAM   01/22/2023: Access Code: [email or web address] URL: [email or web address] Date: 01/22/2023 Prepared by: Temple Pacini  Exercises - Supine Bridge  - 1 x daily - 5 x weekly - 3 sets - 10 reps - Supine Straight Leg Raise  - 1 x daily - 5 x weekly - 2 sets - 10 reps - Seated Hip Abduction with Resistance  - 1 x daily - 5 x weekly - 2 sets - 10 reps  11/21/2022: Access Code: [email or web address] URL: [email or web address] Date: 11/21/2022 Prepared by: Temple Pacini  Exercises *corrected on handout (written) perform the following with UE support on counter - Standing Gastroc Stretch on Foam 1/2 Roll  - 1 x daily - 7 x weekly - 2 sets - 2 reps - 30 seconds hold - Standing Hip Flexor Stretch  - 1 x daily - 7 x weekly - 2 sets - 2 reps - 30 seconds hold  GOALS: Goals reviewed with patient? Yes  SHORT TERM GOALS: Target date: 12/03/2022  Patient will be independent in home exercise program to improve strength/mobility for better functional independence with ADLs. Baseline: to be initiated next 1-2 visits; 02/19/2023: pt not consistently performing HEP Goal status: IN PROGRESS  LONG TERM GOALS: Target date: 01/14/2023  Patient will increase FOTO score to equal to or greater than  76  to demonstrate statistically significant improvement in mobility and quality of life.  Baseline: risk adjusted 51; 02/19/2023: 63 * pt reporting pain in left leg limiting stair climbing Goal status: IN PROGRESS  2.  Pt will reduce 5xsSTS time to < 8 sec to demonstrate clinically significant improvement in LE strength and balance to reduce risk of falls.  Baseline: previously 12.76 sec on 06/10/22, now 10 sec to RW on 10/22/2022; 02/19/2023 8.5 seconds hands-free Goal  status: Partially  MET  3.  Pt will improve 6 MWT to > 1000' with no AD and no LOB to demonstrate ability to improve safety with community distances. Baseline: was 822 ft back in August 2023, now 808 with RW, CGA; 02/19/2023 845 ft with 4WW Goal status: PARTIALLY MET  4.  Pt will improve 10 m gait speed with LRAD to > 1.0 m/s to demonstrate reduced risk of falls along with safe ability to complete community walking tasks Baseline: previously 0.7 m/s on 06/10/2022, now 0.77 m/s with RW; 02/19/2023: 0.82 m/s with RW  Goal status: IN PROGRESS  5.  Patient will increase Berg Balance score by > 6 points to demonstrate decreased fall risk during functional activities.  Baseline: previously 35/56 on 03/11/22(?), test deferred to next appt; 11/13/2022: 36/56; 02/19/2023: 39/56  Goal status: Partially MET   ASSESSMENT:  CLINICAL IMPRESSION: PT and pt discuss plan where pt would like to start pelvic PT in September. Pt agreeable to this plan. Session otherwise focused on improving UE/LE strength necessary for increased independence with ADLs and stretching of bilat LE. Pt pain-limited with R hamstring stretch today, reporting L anterior thigh pain with this stretch. Will continue to monitor. The pt will continue to benefit from skilled PT services to address deficits and impairment identified in evaluation in order to maximize independence and safety in basic mobility required for performance of ADL, IADL, and leisure.     OBJECTIVE IMPAIRMENTS: Abnormal gait, decreased activity tolerance, decreased balance, decreased coordination, decreased endurance, decreased knowledge of use of DME, decreased mobility, difficulty walking, decreased strength, impaired tone, improper body mechanics, postural dysfunction, and pain.   ACTIVITY LIMITATIONS: carrying, lifting, bending, standing, squatting, stairs, transfers, dressing, and locomotion level  PARTICIPATION LIMITATIONS: meal prep, cleaning, shopping, community  activity, and yard work  PERSONAL FACTORS: Fitness, Sex, Time since onset of injury/illness/exacerbation, and 3+ comorbidities: PMH consists of the following: CP, depression, GAD, migraine headache, hx of LE surgery bilateral x2 in 2008  are also affecting patient's functional outcome.   REHAB POTENTIAL: Good  CLINICAL DECISION MAKING: Evolving/moderate complexity  EVALUATION COMPLEXITY: Moderate  PLAN:  PT FREQUENCY: 1-2x/week  PT DURATION: 12 weeks  PLANNED INTERVENTIONS: Therapeutic exercises, Therapeutic activity, Neuromuscular re-education, Balance training, Gait training, Patient/Family education, Self Care, Joint mobilization, Stair training, Vestibular training, Canalith repositioning, Visual/preceptual remediation/compensation, Orthotic/Fit training, DME instructions, Dry Needling, Electrical stimulation, Wheelchair mobility training, Spinal mobilization, Cryotherapy, Moist heat, Splintting, Taping, Biofeedback, Manual therapy, and Re-evaluation  PLAN FOR NEXT SESSION: , training with walker/transfer (how to fold walker, get in/out of car), training and uneven surfaces, LE strengthening, gait exercises/step-length/gait-speed, stretching, stairs, [employer] exercises    Baird Kay, PT 03/19/2023, 4:25 PM  4:25 PM, 03/19/23 Physical Therapist - [facility]  [phone] [facility])

## 2023-03-26 ENCOUNTER — Ambulatory Visit: Payer: Medicaid Other | Attending: Internal Medicine

## 2023-03-26 DIAGNOSIS — M25661 Stiffness of right knee, not elsewhere classified: Secondary | ICD-10-CM | POA: Diagnosis present

## 2023-03-26 DIAGNOSIS — R278 Other lack of coordination: Secondary | ICD-10-CM | POA: Diagnosis present

## 2023-03-26 DIAGNOSIS — M25662 Stiffness of left knee, not elsewhere classified: Secondary | ICD-10-CM | POA: Insufficient documentation

## 2023-03-26 DIAGNOSIS — R262 Difficulty in walking, not elsewhere classified: Secondary | ICD-10-CM | POA: Insufficient documentation

## 2023-03-26 DIAGNOSIS — R2681 Unsteadiness on feet: Secondary | ICD-10-CM | POA: Diagnosis present

## 2023-03-26 DIAGNOSIS — M6281 Muscle weakness (generalized): Secondary | ICD-10-CM | POA: Insufficient documentation

## 2023-03-26 NOTE — Therapy (Unsigned)
OUTPATIENT PHYSICAL THERAPY NEURO TREATMENT NOTE   Patient Name: Renee Walton MRN: [id number] DOB:02-Aug-1996, 27 y.o., female Today's Date: 03/27/2023   PCP: [name], MD REFERRING PROVIDER:   [name], MD    END OF SESSION:  PT End of Session - 03/27/23 1030     Visit Number 14    Number of Visits 25    Date for PT Re-Evaluation 04/16/23    Authorization Type Medicaid [place] Access    PT Start Time 1432    PT Stop Time 1513    PT Time Calculation (min) 41 min    Equipment Utilized During Treatment Gait belt    Activity Tolerance Patient tolerated treatment well    Behavior During Therapy WFL for tasks assessed/performed                Past Medical History:  Diagnosis Date   Breakthrough bleeding    Cerebral palsy (HCC)    GAD (generalized anxiety disorder)    Migraine headache    Past Surgical History:  Procedure Laterality Date   LEG SURGERY     bilateral x2 in 2008   Patient Active Problem List   Diagnosis Date Noted   Bronchitis 03/13/2023   Gastroesophageal reflux disease without esophagitis 02/24/2023   Migraine without aura and without status migrainosus, not intractable 02/24/2023   GAD (generalized anxiety disorder) 02/24/2023   Loss of weight 07/17/2015   Depression 07/17/2015   Cerebral palsy (HCC) 07/17/2015    ONSET DATE: 1996/04/06 pt has CP  REFERRING DIAG:   G80.9 (ICD-10-CM) - Cerebral palsy (HCC)    THERAPY DIAG:  Muscle weakness (generalized)  Other lack of coordination  Unsteadiness on feet  Difficulty in walking, not elsewhere classified  Joint stiffness of right lower leg  Joint stiffness of left lower leg  Rationale for Evaluation and Treatment: Rehabilitation  SUBJECTIVE:                                                                                                                                                                                             SUBJECTIVE STATEMENT. Pt reports  improvement in her pain, has since had botox in her LE and reports this has resolved LE pain. Pt reports she is seeing a dietitian for chronic constipation.    Pt accompanied by: self  PERTINENT HISTORY:    Pt is a pleasant 27 y/o female with CP who is returning to PT after absence and trip to [place]. She would like to improve her mobility, gait and balance. Pt has continued her PT exercises while away and worked on gait, and wants  to become independent using a RW. She reports 2 recent falls, and no injuries. Pt also with stiffness in BLEs. PMH consists of the following: CP, depression, GAD, migraine headache, hx of LE surgery bilateral x2 in 2008.    PAIN: from eval Are you having pain? No  PRECAUTIONS: Fall  WEIGHT BEARING RESTRICTIONS: No  FALLS: Has patient fallen in last 6 months? Yes. Number of falls 2 when walking. She reports no injury. She got back up independently   PATIENT GOALS: Pt would like to improve her ability to ambulate with RW both inside and over outside surfaces in order to increase her independence with mobility. She would like to learn how to safely get in and out of a car with her RW.  OBJECTIVE:     INTERVENTION 03/27/23  TE: Session dedicated to reviewing machines and safe technique with machines in [facility]:  Leg press 2x 10-15 reps at both levels 3 and level 5. Pt rates level 3 as easy and level 5 as moderate. PT instructed pt to advance level of resistance once pt able to easily perform up to 20 reps.  Hamstring curl LVL 2 x 2 round of 10-12 reps. Thorough instruction in set-up for safe use of machine and safe dismount.   Treadmill with BUE support at 1-1.5 incline, ambulating up to 0.7 mph x 5 min total. Reviewed safe technique with set-up, how to adjust incline and speed, safety stops and how to safely get on and off of machine.   STS 15x hands-free    PATIENT EDUCATION: Education details: Pt educated throughout session about proper posture and  technique with exercises. Improved exercise technique, movement at target joints, use of target muscles after min to mod verbal, visual, tactile cues.    Person educated: Patient and Parent Education method: Explanation, Demonstration, and Verbal cues Education comprehension: verbalized understanding and returned demonstration  HOME EXERCISE PROGRAM   01/22/2023: Access Code: [email or web address] URL: [email or web address] Date: 01/22/2023 Prepared by: Temple Pacini  Exercises - Supine Bridge  - 1 x daily - 5 x weekly - 3 sets - 10 reps - Supine Straight Leg Raise  - 1 x daily - 5 x weekly - 2 sets - 10 reps - Seated Hip Abduction with Resistance  - 1 x daily - 5 x weekly - 2 sets - 10 reps  11/21/2022: Access Code: [email or web address] URL: [email or web address] Date: 11/21/2022 Prepared by: Temple Pacini  Exercises *corrected on handout (written) perform the following with UE support on counter - Standing Gastroc Stretch on Foam 1/2 Roll  - 1 x daily - 7 x weekly - 2 sets - 2 reps - 30 seconds hold - Standing Hip Flexor Stretch  - 1 x daily - 7 x weekly - 2 sets - 2 reps - 30 seconds hold  GOALS: Goals reviewed with patient? Yes  SHORT TERM GOALS: Target date: 12/03/2022  Patient will be independent in home exercise program to improve strength/mobility for better functional independence with ADLs. Baseline: to be initiated next 1-2 visits; 02/19/2023: pt not consistently performing HEP Goal status: IN PROGRESS  LONG TERM GOALS: Target date: 01/14/2023  Patient will increase FOTO score to equal to or greater than  76  to demonstrate statistically significant improvement in mobility and quality of life.  Baseline: risk adjusted 51; 02/19/2023: 63 * pt reporting pain in left leg limiting stair climbing Goal status: IN PROGRESS  2.  Pt will reduce 5xsSTS time to < 8 sec to demonstrate clinically significant improvement in  LE strength and balance to reduce risk of falls.  Baseline:  previously 12.76 sec on 06/10/22, now 10 sec to RW on 10/22/2022; 02/19/2023 8.5 seconds hands-free Goal status: Partially MET  3.  Pt will improve 6 MWT to > 1000' with no AD and no LOB to demonstrate ability to improve safety with community distances. Baseline: was 822 ft back in August 2023, now 808 with RW, CGA; 02/19/2023 845 ft with 4WW Goal status: PARTIALLY MET  4.  Pt will improve 10 m gait speed with LRAD to > 1.0 m/s to demonstrate reduced risk of falls along with safe ability to complete community walking tasks Baseline: previously 0.7 m/s on 06/10/2022, now 0.77 m/s with RW; 02/19/2023: 0.82 m/s with RW  Goal status: IN PROGRESS  5.  Patient will increase Berg Balance score by > 6 points to demonstrate decreased fall risk during functional activities.  Baseline: previously 35/56 on 03/11/22(?), test deferred to next appt; 11/13/2022: 36/56; 02/19/2023: 39/56  Goal status: Partially MET   ASSESSMENT:  CLINICAL IMPRESSION: Session dedicated to reviewing technique and safety with machines in [place] so pt can increase independence with strength training activities beyond PT. Pt able to demo safe and correct technique during appointment. Plan for further review future visits. The pt will continue to benefit from skilled PT services to address deficits and impairment identified in evaluation in order to maximize independence and safety in basic mobility required for performance of ADL, IADL, and leisure.     OBJECTIVE IMPAIRMENTS: Abnormal gait, decreased activity tolerance, decreased balance, decreased coordination, decreased endurance, decreased knowledge of use of DME, decreased mobility, difficulty walking, decreased strength, impaired tone, improper body mechanics, postural dysfunction, and pain.   ACTIVITY LIMITATIONS: carrying, lifting, bending, standing, squatting, stairs, transfers, dressing, and locomotion level  PARTICIPATION LIMITATIONS: meal prep, cleaning, shopping, community  activity, and yard work  PERSONAL FACTORS: Fitness, Sex, Time since onset of injury/illness/exacerbation, and 3+ comorbidities: PMH consists of the following: CP, depression, GAD, migraine headache, hx of LE surgery bilateral x2 in 2008  are also affecting patient's functional outcome.   REHAB POTENTIAL: Good  CLINICAL DECISION MAKING: Evolving/moderate complexity  EVALUATION COMPLEXITY: Moderate  PLAN:  PT FREQUENCY: 1-2x/week  PT DURATION: 12 weeks  PLANNED INTERVENTIONS: Therapeutic exercises, Therapeutic activity, Neuromuscular re-education, Balance training, Gait training, Patient/Family education, Self Care, Joint mobilization, Stair training, Vestibular training, Canalith repositioning, Visual/preceptual remediation/compensation, Orthotic/Fit training, DME instructions, Dry Needling, Electrical stimulation, Wheelchair mobility training, Spinal mobilization, Cryotherapy, Moist heat, Splintting, Taping, Biofeedback, Manual therapy, and Re-evaluation  PLAN FOR NEXT SESSION: , training with walker/transfer (how to fold walker, get in/out of car), training and uneven surfaces, LE strengthening, gait exercises/step-length/gait-speed, stretching, stairs, [employer] exercises    Baird Kay, PT 03/27/2023, 10:35 AM  10:35 AM, 03/27/23 Physical Therapist - [facility]  [phone] [facility])

## 2023-03-27 ENCOUNTER — Encounter: Payer: Self-pay | Admitting: Internal Medicine

## 2023-03-27 ENCOUNTER — Ambulatory Visit: Payer: Medicaid Other | Admitting: Internal Medicine

## 2023-03-27 VITALS — BP 108/62 | HR 90 | Ht 62.0 in | Wt 88.0 lb

## 2023-03-27 DIAGNOSIS — G801 Spastic diplegic cerebral palsy: Secondary | ICD-10-CM | POA: Diagnosis not present

## 2023-03-27 DIAGNOSIS — K219 Gastro-esophageal reflux disease without esophagitis: Secondary | ICD-10-CM | POA: Diagnosis not present

## 2023-03-27 DIAGNOSIS — F411 Generalized anxiety disorder: Secondary | ICD-10-CM

## 2023-03-27 DIAGNOSIS — J301 Allergic rhinitis due to pollen: Secondary | ICD-10-CM | POA: Diagnosis not present

## 2023-03-27 NOTE — Progress Notes (Signed)
Established Patient Office Visit  Subjective:  Patient ID: Renee Walton, female    DOB: 1996-04-11  Age: 27 y.o. MRN: [id number]  Chief Complaint  Patient presents with   Follow-up    10 day follow up    Patient comes in for follow-up of her bronchitis.  She is doing much better today and just has very mild dry residual cough.  She has completed her Z-Pak.  She does not have any more fevers chills or fatigue.  She has been advised to use antihistamine regularly. She is undergoing regular rehab and physical therapy.  Does not have any other new complaints.    No other concerns at this time.   Past Medical History:  Diagnosis Date   Breakthrough bleeding    Cerebral palsy (HCC)    GAD (generalized anxiety disorder)    Migraine headache     Past Surgical History:  Procedure Laterality Date   LEG SURGERY     bilateral x2 in 2008    Social History   Socioeconomic History   Marital status: Single    Spouse name: Not on file   Number of children: Not on file   Years of education: Not on file   Highest education level: Not on file  Occupational History   Not on file  Tobacco Use   Smoking status: Never   Smokeless tobacco: Never  Vaping Use   Vaping Use: Never used  Substance and Sexual Activity   Alcohol use: No    Alcohol/week: 0.0 standard drinks of alcohol   Drug use: No   Sexual activity: Never  Other Topics Concern   Not on file  Social History Narrative   ** Merged History Encounter **       Social Determinants of Health   Financial Resource Strain: Not on file  Food Insecurity: Not on file  Transportation Needs: Not on file  Physical Activity: Not on file  Stress: Not on file  Social Connections: Not on file  Intimate Partner Violence: Not on file    Family History  Problem Relation Age of Onset   Stroke Other        grandparent   Hypertension Other        parent   Mental illness Other        parent   Diabetes Other        parent,  grandparent    Allergies  Allergen Reactions   Cat Hair Extract    Cat Hair Extract Itching    Review of Systems  Constitutional:  Negative for chills, diaphoresis, fever, malaise/fatigue and weight loss.  HENT: Negative.  Negative for congestion, ear pain, hearing loss, sinus pain and sore throat.   Eyes: Negative.   Respiratory:  Negative for cough, sputum production, shortness of breath and wheezing.   Cardiovascular:  Positive for chest pain. Negative for claudication and PND.  Gastrointestinal:  Negative for abdominal pain, blood in stool, diarrhea, heartburn, melena, nausea and vomiting.  Genitourinary:  Negative for dysuria, flank pain, frequency, hematuria and urgency.  Musculoskeletal:  Positive for back pain, falls and joint pain. Negative for myalgias and neck pain.  Skin: Negative.   Neurological:  Positive for tremors, focal weakness and weakness. Negative for dizziness, sensory change, speech change and headaches.  Psychiatric/Behavioral:  Negative for depression and memory loss. The patient is not nervous/anxious and does not have insomnia.        Objective:   BP 108/62   Pulse 90  Ht 5\' 2"  (1.575 m)   Wt 88 lb (39.9 kg)   SpO2 98%   BMI 16.10 kg/m   Vitals:   03/27/23 1254  BP: 108/62  Pulse: 90  Height: 5\' 2"  (1.575 m)  Weight: 88 lb (39.9 kg)  SpO2: 98%  BMI (Calculated): 16.09    Physical Exam Vitals and nursing note reviewed.  Constitutional:      Appearance: Normal appearance.  Cardiovascular:     Rate and Rhythm: Normal rate and regular rhythm.     Pulses: Normal pulses.     Heart sounds: Normal heart sounds.  Pulmonary:     Effort: Pulmonary effort is normal.     Breath sounds: Normal breath sounds. No rales.  Chest:     Chest wall: No tenderness.  Abdominal:     General: Bowel sounds are normal.     Palpations: Abdomen is soft.  Musculoskeletal:     Cervical back: Normal range of motion and neck supple.  Skin:    General: Skin  is warm and dry.  Neurological:     Mental Status: She is alert and oriented to person, place, and time.     Motor: Weakness present.     Coordination: Coordination abnormal.     Gait: Gait abnormal.     Deep Tendon Reflexes: Reflexes abnormal.      No results found for any visits on 03/27/23.  Recent Results (from the past 2160 hour(s))  POCT XPERT XPRESS SARS COVID-2/FLU/RSV [[id number]]     Status: Normal   Collection Time: 03/13/23  3:40 PM  Result Value Ref Range   SARS Coronavirus 2 negative    FLU A negative    FLU B negative    RSV RNA, PCR negative       Assessment & Plan:   Problem List Items Addressed This Visit     Cerebral palsy (HCC)   Gastroesophageal reflux disease without esophagitis   GAD (generalized anxiety disorder)   Seasonal allergic rhinitis due to pollen - Primary    Return in about 4 months (around 07/27/2023).   Total time spent: 25 minutes  [name], MD  03/27/2023   This document may have been prepared by [facility] Voice Recognition software and as such may include unintentional dictation errors.

## 2023-03-31 ENCOUNTER — Other Ambulatory Visit: Payer: Self-pay

## 2023-03-31 DIAGNOSIS — R110 Nausea: Secondary | ICD-10-CM

## 2023-04-01 MED ORDER — ONDANSETRON HCL 4 MG PO TABS: ORAL_TABLET | ORAL | 3 refills | Status: DC

## 2023-04-02 ENCOUNTER — Ambulatory Visit: Payer: Medicaid Other

## 2023-04-09 ENCOUNTER — Ambulatory Visit: Payer: Medicaid Other

## 2023-04-09 DIAGNOSIS — M6281 Muscle weakness (generalized): Secondary | ICD-10-CM | POA: Diagnosis not present

## 2023-04-09 DIAGNOSIS — R278 Other lack of coordination: Secondary | ICD-10-CM

## 2023-04-09 DIAGNOSIS — M25661 Stiffness of right knee, not elsewhere classified: Secondary | ICD-10-CM

## 2023-04-09 DIAGNOSIS — R2681 Unsteadiness on feet: Secondary | ICD-10-CM

## 2023-04-09 DIAGNOSIS — R262 Difficulty in walking, not elsewhere classified: Secondary | ICD-10-CM

## 2023-04-09 DIAGNOSIS — M25662 Stiffness of left knee, not elsewhere classified: Secondary | ICD-10-CM

## 2023-04-09 NOTE — Therapy (Signed)
OUTPATIENT PHYSICAL THERAPY NEURO TREATMENT NOTE   Patient Name: Renee Walton MRN: [id number] DOB:16-May-1996, 27 y.o., female Today's Date: 04/09/2023   PCP: [name], MD REFERRING PROVIDER:   [name], MD    END OF SESSION:  PT End of Session - 04/09/23 1518     Visit Number 15    Number of Visits 25    Date for PT Re-Evaluation 04/16/23    Authorization Type Medicaid [place] Access    PT Start Time 1431    PT Stop Time 1511    PT Time Calculation (min) 40 min    Equipment Utilized During Treatment Gait belt    Activity Tolerance Patient tolerated treatment well    Behavior During Therapy WFL for tasks assessed/performed                Past Medical History:  Diagnosis Date   Breakthrough bleeding    Cerebral palsy (HCC)    GAD (generalized anxiety disorder)    Migraine headache    Past Surgical History:  Procedure Laterality Date   LEG SURGERY     bilateral x2 in 2008   Patient Active Problem List   Diagnosis Date Noted   Seasonal allergic rhinitis due to pollen 03/27/2023   Bronchitis 03/13/2023   Gastroesophageal reflux disease without esophagitis 02/24/2023   Migraine without aura and without status migrainosus, not intractable 02/24/2023   GAD (generalized anxiety disorder) 02/24/2023   Loss of weight 07/17/2015   Depression 07/17/2015   Cerebral palsy (HCC) 07/17/2015    ONSET DATE: 07/18/94 pt has CP  REFERRING DIAG:   G80.9 (ICD-10-CM) - Cerebral palsy (HCC)    THERAPY DIAG:  Unsteadiness on feet  Other lack of coordination  Difficulty in walking, not elsewhere classified  Muscle weakness (generalized)  Joint stiffness of right lower leg  Joint stiffness of left lower leg  Rationale for Evaluation and Treatment: Rehabilitation  SUBJECTIVE:                                                                                                                                                                                              SUBJECTIVE STATEMENT. Pt reports no falls, she said she had a good week and has done a lot of things independently lately.   Pt accompanied by: self  PERTINENT HISTORY:    Pt is a pleasant 27 y/o female with CP who is returning to PT after absence and trip to [place]. She would like to improve her mobility, gait and balance. Pt has continued her PT exercises while away and worked on gait, and wants to become  independent using a RW. She reports 2 recent falls, and no injuries. Pt also with stiffness in BLEs. PMH consists of the following: CP, depression, GAD, migraine headache, hx of LE surgery bilateral x2 in 2008.    PAIN: from eval Are you having pain? No  PRECAUTIONS: Fall  WEIGHT BEARING RESTRICTIONS: No  FALLS: Has patient fallen in last 6 months? Yes. Number of falls 2 when walking. She reports no injury. She got back up independently   PATIENT GOALS: Pt would like to improve her ability to ambulate with RW both inside and over outside surfaces in order to increase her independence with mobility. She would like to learn how to safely get in and out of a car with her RW.  OBJECTIVE:     INTERVENTION 04/09/23  TA: Session dedicated to further review of safe technique with transferring onto and off of machines in [place] and safe adjustment and use of machines -   Instruction in use of knee ext and hamstring curl machine, with focus on safe technique of getting onto and off of machine, adjusting weights. Pt completes 1 set of 10 reps at level 1 for both knee ext and hamstring curl bilaterally. Pt practices adjusting and getting on and off machine 4x. PT provides VC/TC/Demo throughout for technique. Pt exhibits safe ability to transfer onto/off of machine, but does require assistance with adjusting machine. CGA provided  Instruction in use of row machine, with focus on safe technique of getting onto and off of rower. Pt completes 5 min of rowing with instruction in UE/LE  positioning and sequencing throughout, PT providing CGA while pt rows. PT provides up to min assist, including assistance with LE placement/positioning for transferring on and off of row. Pt then trials multiple different techniques to safely complete transfer. At this time PT recommends pt only use machine when in PT due to need to improve technique and balance and due to current level of assistance required.    PATIENT EDUCATION: Education details: Pt educated throughout session about proper posture and technique with exercises. Improved exercise technique, movement at target joints, use of target muscles after min to mod verbal, visual, tactile cues. Safe technique with [employer]    Person educated: Patient and Parent Education method: Explanation, Demonstration, and Verbal cues Education comprehension: verbalized understanding and returned demonstration  HOME EXERCISE PROGRAM   01/22/2023: Access Code: [email or web address] URL: [email or web address] Date: 01/22/2023 Prepared by: Temple Pacini  Exercises - Supine Bridge  - 1 x daily - 5 x weekly - 3 sets - 10 reps - Supine Straight Leg Raise  - 1 x daily - 5 x weekly - 2 sets - 10 reps - Seated Hip Abduction with Resistance  - 1 x daily - 5 x weekly - 2 sets - 10 reps  11/21/2022: Access Code: [email or web address] URL: [email or web address] Date: 11/21/2022 Prepared by: Temple Pacini  Exercises *corrected on handout (written) perform the following with UE support on counter - Standing Gastroc Stretch on Foam 1/2 Roll  - 1 x daily - 7 x weekly - 2 sets - 2 reps - 30 seconds hold - Standing Hip Flexor Stretch  - 1 x daily - 7 x weekly - 2 sets - 2 reps - 30 seconds hold  GOALS: Goals reviewed with patient? Yes  SHORT TERM GOALS: Target date: 12/03/2022  Patient will be independent in home exercise program to improve strength/mobility for better functional independence with ADLs. Baseline: to be initiated next 1-2 visits;  02/19/2023: pt not  consistently performing HEP Goal status: IN PROGRESS  LONG TERM GOALS: Target date: 01/14/2023  Patient will increase FOTO score to equal to or greater than  76  to demonstrate statistically significant improvement in mobility and quality of life.  Baseline: risk adjusted 51; 02/19/2023: 63 * pt reporting pain in left leg limiting stair climbing Goal status: IN PROGRESS  2.  Pt will reduce 5xsSTS time to < 8 sec to demonstrate clinically significant improvement in LE strength and balance to reduce risk of falls.  Baseline: previously 12.76 sec on 06/10/22, now 10 sec to RW on 10/22/2022; 02/19/2023 8.5 seconds hands-free Goal status: Partially MET  3.  Pt will improve 6 MWT to > 1000' with no AD and no LOB to demonstrate ability to improve safety with community distances. Baseline: was 822 ft back in August 2023, now 808 with RW, CGA; 02/19/2023 845 ft with 4WW Goal status: PARTIALLY MET  4.  Pt will improve 10 m gait speed with LRAD to > 1.0 m/s to demonstrate reduced risk of falls along with safe ability to complete community walking tasks Baseline: previously 0.7 m/s on 06/10/2022, now 0.77 m/s with RW; 02/19/2023: 0.82 m/s with RW  Goal status: IN PROGRESS  5.  Patient will increase Berg Balance score by > 6 points to demonstrate decreased fall risk during functional activities.  Baseline: previously 35/56 on 03/11/22(?), test deferred to next appt; 11/13/2022: 36/56; 02/19/2023: 39/56  Goal status: Partially MET   ASSESSMENT:  CLINICAL IMPRESSION: Session dedicated to continued instruction in [place] machines. Today, main focus was on safe transferring on and off machines and safe technique with independently setting up/adjusting machines. PT at this time recommends to pt she only use row machine with PT during sessions as pt currently requires CGA-min assist to use rower. The pt will continue to benefit from skilled PT services to address deficits and impairment identified in  evaluation in order to maximize independence and safety in basic mobility required for performance of ADL, IADL, and leisure.     OBJECTIVE IMPAIRMENTS: Abnormal gait, decreased activity tolerance, decreased balance, decreased coordination, decreased endurance, decreased knowledge of use of DME, decreased mobility, difficulty walking, decreased strength, impaired tone, improper body mechanics, postural dysfunction, and pain.   ACTIVITY LIMITATIONS: carrying, lifting, bending, standing, squatting, stairs, transfers, dressing, and locomotion level  PARTICIPATION LIMITATIONS: meal prep, cleaning, shopping, community activity, and yard work  PERSONAL FACTORS: Fitness, Sex, Time since onset of injury/illness/exacerbation, and 3+ comorbidities: PMH consists of the following: CP, depression, GAD, migraine headache, hx of LE surgery bilateral x2 in 2008  are also affecting patient's functional outcome.   REHAB POTENTIAL: Good  CLINICAL DECISION MAKING: Evolving/moderate complexity  EVALUATION COMPLEXITY: Moderate  PLAN:  PT FREQUENCY: 1-2x/week  PT DURATION: 12 weeks  PLANNED INTERVENTIONS: Therapeutic exercises, Therapeutic activity, Neuromuscular re-education, Balance training, Gait training, Patient/Family education, Self Care, Joint mobilization, Stair training, Vestibular training, Canalith repositioning, Visual/preceptual remediation/compensation, Orthotic/Fit training, DME instructions, Dry Needling, Electrical stimulation, Wheelchair mobility training, Spinal mobilization, Cryotherapy, Moist heat, Splintting, Taping, Biofeedback, Manual therapy, and Re-evaluation  PLAN FOR NEXT SESSION: , training with walker/transfer (how to fold walker, get in/out of car), training and uneven surfaces, LE strengthening, gait exercises/step-length/gait-speed, stretching, stairs, [employer] exercises    Baird Kay, PT 04/09/2023, 3:51 PM  3:51 PM, 04/09/23 Physical Therapist - [facility] [place]  [phone] [facility])

## 2023-04-16 ENCOUNTER — Ambulatory Visit: Payer: Medicaid Other

## 2023-04-22 ENCOUNTER — Ambulatory Visit: Payer: MEDICAID | Admitting: Podiatry

## 2023-04-22 DIAGNOSIS — G809 Cerebral palsy, unspecified: Secondary | ICD-10-CM

## 2023-04-22 DIAGNOSIS — M21962 Unspecified acquired deformity of left lower leg: Secondary | ICD-10-CM

## 2023-04-22 DIAGNOSIS — M21961 Unspecified acquired deformity of right lower leg: Secondary | ICD-10-CM | POA: Diagnosis not present

## 2023-04-22 DIAGNOSIS — M216X1 Other acquired deformities of right foot: Secondary | ICD-10-CM

## 2023-04-22 DIAGNOSIS — R2689 Other abnormalities of gait and mobility: Secondary | ICD-10-CM | POA: Diagnosis not present

## 2023-04-22 NOTE — Progress Notes (Addendum)
Subjective:  Patient ID: Renee Walton, female    DOB: 1996-06-23,  MRN: [id number]  Chief Complaint  Patient presents with   Foot Pain    27 y.o. female presents with the above complaint.  Patient presents with bilateral antalgic gait with history of cerebral palsy.  She states that her ankle gives out and has not been functioning right.  She wanted to get it evaluated discuss bracing option at the moment she does not wear any bracing.  She denies seeing anyone as prior to seeing me.  She would like to discuss treatment options she is here with her mother today.   Review of Systems: Negative except as noted in the HPI. Denies N/V/F/Ch.  Past Medical History:  Diagnosis Date   Breakthrough bleeding    Cerebral palsy (HCC)    GAD (generalized anxiety disorder)    Migraine headache     Current Outpatient Medications:    baclofen (LIORESAL) 10 MG tablet, Take 10 mg by mouth 3 (three) times daily., Disp: , Rfl:    escitalopram (LEXAPRO) 5 MG tablet, Take 5 mg by mouth daily., Disp: , Rfl:    gabapentin (NEURONTIN) 100 MG capsule, Take 100 mg by mouth at bedtime., Disp: , Rfl:    meloxicam (MOBIC) 15 MG tablet, Take 15 mg by mouth daily as needed., Disp: , Rfl:    Multiple Vitamins-Minerals (CENTRUM SILVER ULTRA WOMENS PO), Take by mouth., Disp: , Rfl:    Norethindrone-Ethinyl Estradiol-Fe Biphas (LO LOESTRIN FE) 1 MG-10 MCG / 10 MCG tablet, Take 1 tablet by mouth daily., Disp: 84 tablet, Rfl: 3   omeprazole (PRILOSEC) 40 MG capsule, Take 40 mg by mouth daily., Disp: , Rfl:    ondansetron (ZOFRAN) 4 MG tablet, TAKE ONE TABLET BY MOUTH EVERY 4 TO 6 HOURS AS NEEDED, Disp: 60 tablet, Rfl: 3   senna-docusate (SENOKOT-S) 8.6-50 MG tablet, Take by mouth., Disp: , Rfl:    SUMAtriptan (IMITREX) 50 MG tablet, Take by mouth as directed., Disp: , Rfl:    Vitamin D, Ergocalciferol, (DRISDOL) 50000 units CAPS capsule, Take 50,000 Units by mouth every 7 (seven) days., Disp: , Rfl:   Social History    Tobacco Use  Smoking Status Never  Smokeless Tobacco Never    Allergies  Allergen Reactions   Cat Hair Extract    Cat Hair Extract Itching   Objective:  There were no vitals filed for this visit. There is no height or weight on file to calculate BMI. Constitutional Well developed. Well nourished.  Vascular Dorsalis pedis pulses palpable bilaterally. Posterior tibial pulses palpable bilaterally. Capillary refill normal to all digits.  No cyanosis or clubbing noted. Pedal hair growth normal.  Neurologic Normal speech. Oriented to person, place, and time. Epicritic sensation to light touch grossly present bilaterally.  Dermatologic Nails well groomed and normal in appearance. No open wounds. No skin lesions.  Orthopedic: Gait examination shows antalgic gait with foot drop with contracture of cerebral palsy.  No open wounds or lesion noted.  Hyperhidrosis of both feet noted.   Radiographs: None Assessment:   1. Antalgic gait   2. Cerebral palsy, unspecified type [facility])    Plan:  Patient was evaluated and treated and all questions answered.  Bilateral antalgic gait with history of cerebral palsy/foot deformity  -All questions and concerns were discussed with the patient extensive detail.  Patient is experiencing some history of falling as well.  She will benefit from a foam dressing to allow her to ambulate more naturally.  Due to the antalgic gait she is a high risk of falls.  I discussed this with the patient and her mom and they are both in agreement. -Prescription for [facility] was given for AFO bracing bilaterally  No follow-ups on file.   Bilateral antalgic gait cerabral pasly AFO bracing [facility]

## 2023-04-23 ENCOUNTER — Ambulatory Visit: Payer: MEDICAID | Attending: Internal Medicine

## 2023-04-23 DIAGNOSIS — M25661 Stiffness of right knee, not elsewhere classified: Secondary | ICD-10-CM | POA: Diagnosis present

## 2023-04-23 DIAGNOSIS — M6281 Muscle weakness (generalized): Secondary | ICD-10-CM | POA: Insufficient documentation

## 2023-04-23 DIAGNOSIS — M25662 Stiffness of left knee, not elsewhere classified: Secondary | ICD-10-CM | POA: Diagnosis present

## 2023-04-23 DIAGNOSIS — R262 Difficulty in walking, not elsewhere classified: Secondary | ICD-10-CM | POA: Diagnosis present

## 2023-04-23 DIAGNOSIS — R2681 Unsteadiness on feet: Secondary | ICD-10-CM | POA: Insufficient documentation

## 2023-04-23 DIAGNOSIS — R278 Other lack of coordination: Secondary | ICD-10-CM | POA: Insufficient documentation

## 2023-04-23 NOTE — Therapy (Signed)
OUTPATIENT PHYSICAL THERAPY NEURO TREATMENT NOTE/RECERT   Patient Name: Renee Walton MRN: [id number] DOB:Feb 22, 1996, 27 y.o., female Today's Date: 04/23/2023   PCP: [name], MD REFERRING PROVIDER:   [name], MD    END OF SESSION:  PT End of Session - 04/23/23 1518     Visit Number 16    Number of Visits 40    Date for PT Re-Evaluation 07/16/23    Authorization Type Medicaid [place] Access    PT Start Time 1433    PT Stop Time 1513    PT Time Calculation (min) 40 min    Equipment Utilized During Treatment Gait belt    Activity Tolerance Patient tolerated treatment well    Behavior During Therapy WFL for tasks assessed/performed                Past Medical History:  Diagnosis Date   Breakthrough bleeding    Cerebral palsy (HCC)    GAD (generalized anxiety disorder)    Migraine headache    Past Surgical History:  Procedure Laterality Date   LEG SURGERY     bilateral x2 in 2008   Patient Active Problem List   Diagnosis Date Noted   Seasonal allergic rhinitis due to pollen 03/27/2023   Bronchitis 03/13/2023   Gastroesophageal reflux disease without esophagitis 02/24/2023   Migraine without aura and without status migrainosus, not intractable 02/24/2023   GAD (generalized anxiety disorder) 02/24/2023   Loss of weight 07/17/2015   Depression 07/17/2015   Cerebral palsy (HCC) 07/17/2015    ONSET DATE: 06-02-1994 pt has CP  REFERRING DIAG:   G80.9 (ICD-10-CM) - Cerebral palsy (HCC)    THERAPY DIAG:  Difficulty in walking, not elsewhere classified  Other lack of coordination  Muscle weakness (generalized)  Unsteadiness on feet  Rationale for Evaluation and Treatment: Rehabilitation  SUBJECTIVE:                                                                                                                                                                                             SUBJECTIVE STATEMENT. Pt reports no falls. She does  still have some LE pain (did improve with botox)   Pt accompanied by: self  PERTINENT HISTORY:    Pt is a pleasant 27 y/o female with CP who is returning to PT after absence and trip to [place]. She would like to improve her mobility, gait and balance. Pt has continued her PT exercises while away and worked on gait, and wants to become independent using a RW. She reports 2 recent falls, and no injuries. Pt also with stiffness in BLEs. PMH  consists of the following: CP, depression, GAD, migraine headache, hx of LE surgery bilateral x2 in 2008.    PAIN: from eval Are you having pain? No  PRECAUTIONS: Fall  WEIGHT BEARING RESTRICTIONS: No  FALLS: Has patient fallen in last 6 months? Yes. Number of falls 2 when walking. She reports no injury. She got back up independently   PATIENT GOALS: Pt would like to improve her ability to ambulate with RW both inside and over outside surfaces in order to increase her independence with mobility. She would like to learn how to safely get in and out of a car with her RW.  OBJECTIVE:     INTERVENTION 04/23/23  TA: Goal testing completed for recert visit. PT instructed pt in testing and test performance. Please refer to goal section below for details.    PATIENT EDUCATION: Education details: Pt educated throughout session about proper posture and technique with exercises. Improved exercise technique, movement at target joints, use of target muscles after min to mod verbal, visual, tactile cues. Safe technique with [employer]    Person educated: Patient and Parent Education method: Explanation, Demonstration, and Verbal cues Education comprehension: verbalized understanding and returned demonstration  HOME EXERCISE PROGRAM   01/22/2023: Access Code: [email or web address] URL: [email or web address] Date: 01/22/2023 Prepared by: Temple Pacini  Exercises - Supine Bridge  - 1 x daily - 5 x weekly - 3 sets - 10 reps - Supine Straight Leg  Raise  - 1 x daily - 5 x weekly - 2 sets - 10 reps - Seated Hip Abduction with Resistance  - 1 x daily - 5 x weekly - 2 sets - 10 reps  11/21/2022: Access Code: [email or web address] URL: [email or web address] Date: 11/21/2022 Prepared by: Temple Pacini  Exercises *corrected on handout (written) perform the following with UE support on counter - Standing Gastroc Stretch on Foam 1/2 Roll  - 1 x daily - 7 x weekly - 2 sets - 2 reps - 30 seconds hold - Standing Hip Flexor Stretch  - 1 x daily - 7 x weekly - 2 sets - 2 reps - 30 seconds hold  GOALS: Goals reviewed with patient? Yes  SHORT TERM GOALS: Target date: 12/03/2022  Patient will be independent in home exercise program to improve strength/mobility for better functional independence with ADLs. Baseline: to be initiated next 1-2 visits; 02/19/2023: pt not consistently performing HEP; 04/23/23:  Pt reports she goes to gym every three days Goal status: IN PROGRESS  LONG TERM GOALS: Target date: 01/14/2023  Patient will increase FOTO score to equal to or greater than  76  to demonstrate statistically significant improvement in mobility and quality of life.  Baseline: risk adjusted 51; 02/19/2023: 63 * pt reporting pain in left leg limiting stair climbing; 04/23/23: 81 Goal status: MET  2.  Pt will reduce 5xsSTS time to < 8 sec to demonstrate clinically significant improvement in LE strength and balance to reduce risk of falls.  Baseline: previously 12.76 sec on 06/10/22, now 10 sec to RW on 10/22/2022; 02/19/2023 8.5 seconds hands-free; 04/23/23: 7 seconds goal status: MET  3.  Pt will improve 6 MWT to > 1000' with no AD and no LOB to demonstrate ability to improve safety with community distances. Baseline: was 822 ft back in August 2023, now 808 with RW, CGA; 02/19/2023 845 ft with 4WW; 04/23/23: 820 ft 4WW  Goal status: PARTIALLY MET  4.  Pt will improve 10 m gait speed with LRAD to > 1.0 m/s to demonstrate reduced  risk of falls along with safe ability  to complete community walking tasks Baseline: previously 0.7 m/s on 06/10/2022, now 0.77 m/s with RW; 02/19/2023: 0.82 m/s with RW; 04/23/23: 0.95 m/s with RW Goal status: Partially MET  5.  Patient will increase Berg Balance score by > 6 points to demonstrate decreased fall risk during functional activities.  Baseline: previously 35/56 on 03/11/22(?), test deferred to next appt; 11/13/2022: 36/56; 02/19/2023: 39/56; 04/23/23: 42/56  Goal status: MET   ASSESSMENT:  CLINICAL IMPRESSION: Goal assessment completed for recertificatoin. Pt making gains AEB meeting BERG, FOTO, and 5xSTS goals. Pt also partially met goal. These improvements indicate increased balance ability, strength, and decreased fall risk, as well as improved perception of functional mobility and QOL. While pt making significant gains, pt with similar/slight decrease in performance today. Patient's condition has the potential to improve in response to therapy. Maximum improvement is yet to be obtained. The anticipated improvement is attainable and reasonable in a generally predictable time. The pt will continue to benefit from skilled PT services to address deficits and impairment identified in evaluation in order to maximize independence and safety in basic mobility required for performance of ADL, IADL, and leisure.     OBJECTIVE IMPAIRMENTS: Abnormal gait, decreased activity tolerance, decreased balance, decreased coordination, decreased endurance, decreased knowledge of use of DME, decreased mobility, difficulty walking, decreased strength, impaired tone, improper body mechanics, postural dysfunction, and pain.   ACTIVITY LIMITATIONS: carrying, lifting, bending, standing, squatting, stairs, transfers, dressing, and locomotion level  PARTICIPATION LIMITATIONS: meal prep, cleaning, shopping, community activity, and yard work  PERSONAL FACTORS: Fitness, Sex, Time since onset of injury/illness/exacerbation, and 3+ comorbidities:  PMH consists of the following: CP, depression, GAD, migraine headache, hx of LE surgery bilateral x2 in 2008  are also affecting patient's functional outcome.   REHAB POTENTIAL: Good  CLINICAL DECISION MAKING: Evolving/moderate complexity  EVALUATION COMPLEXITY: Moderate  PLAN:  PT FREQUENCY: 1-2x/week  PT DURATION: 12 weeks  PLANNED INTERVENTIONS: Therapeutic exercises, Therapeutic activity, Neuromuscular re-education, Balance training, Gait training, Patient/Family education, Self Care, Joint mobilization, Stair training, Vestibular training, Canalith repositioning, Visual/preceptual remediation/compensation, Orthotic/Fit training, DME instructions, Dry Needling, Electrical stimulation, Wheelchair mobility training, Spinal mobilization, Cryotherapy, Moist heat, Splintting, Taping, Biofeedback, Manual therapy, and Re-evaluation  PLAN FOR NEXT SESSION: , training with walker/transfer (how to fold walker, get in/out of car), training and uneven surfaces, LE strengthening, gait exercises/step-length/gait-speed, stretching, stairs, [employer] exercises, curbs  Baird Kay, PT 04/23/2023, 3:25 PM  3:25 PM, 04/23/23 Physical Therapist - [facility]

## 2023-04-30 ENCOUNTER — Ambulatory Visit: Payer: MEDICAID

## 2023-04-30 DIAGNOSIS — M6281 Muscle weakness (generalized): Secondary | ICD-10-CM

## 2023-04-30 DIAGNOSIS — R2681 Unsteadiness on feet: Secondary | ICD-10-CM

## 2023-04-30 DIAGNOSIS — R262 Difficulty in walking, not elsewhere classified: Secondary | ICD-10-CM | POA: Diagnosis not present

## 2023-04-30 DIAGNOSIS — M25662 Stiffness of left knee, not elsewhere classified: Secondary | ICD-10-CM

## 2023-04-30 DIAGNOSIS — M25661 Stiffness of right knee, not elsewhere classified: Secondary | ICD-10-CM

## 2023-04-30 DIAGNOSIS — R278 Other lack of coordination: Secondary | ICD-10-CM

## 2023-04-30 NOTE — Therapy (Signed)
OUTPATIENT PHYSICAL THERAPY NEURO TREATMENT NOTE   Patient Name: Renee Walton MRN: [id number] DOB:10-04-1996, 27 y.o., female Today's Date: 04/30/2023   PCP: [name], MD REFERRING PROVIDER:   [name], MD    END OF SESSION:  PT End of Session - 04/30/23 1636     Visit Number 17    Number of Visits 40    Date for PT Re-Evaluation 07/16/23    Authorization Type Medicaid [place] Access    PT Start Time 1404    PT Stop Time 1445    PT Time Calculation (min) 41 min    Equipment Utilized During Treatment Gait belt    Activity Tolerance Patient tolerated treatment well    Behavior During Therapy WFL for tasks assessed/performed                Past Medical History:  Diagnosis Date   Breakthrough bleeding    Cerebral palsy (HCC)    GAD (generalized anxiety disorder)    Migraine headache    Past Surgical History:  Procedure Laterality Date   LEG SURGERY     bilateral x2 in 2008   Patient Active Problem List   Diagnosis Date Noted   Seasonal allergic rhinitis due to pollen 03/27/2023   Bronchitis 03/13/2023   Gastroesophageal reflux disease without esophagitis 02/24/2023   Migraine without aura and without status migrainosus, not intractable 02/24/2023   GAD (generalized anxiety disorder) 02/24/2023   Loss of weight 07/17/2015   Depression 07/17/2015   Cerebral palsy (HCC) 07/17/2015    ONSET DATE: 1993/12/11 pt has CP  REFERRING DIAG:   G80.9 (ICD-10-CM) - Cerebral palsy (HCC)    THERAPY DIAG:  Joint stiffness of right lower leg  Joint stiffness of left lower leg  Muscle weakness (generalized)  Difficulty in walking, not elsewhere classified  Other lack of coordination  Unsteadiness on feet  Rationale for Evaluation and Treatment: Rehabilitation  SUBJECTIVE:                                                                                                                                                                                              SUBJECTIVE STATEMENT. Pt is going to the gym 2x/week. Pt reports pain in RLE, describes it as a soreness, thinks it is due to her workouts. Pt reports she is trying to sign up for a service where a Zenaida Niece can take her to the gym, but pt cannot get in [place] easily, is concerned about this. She is wondering about other services to help her.   Pt accompanied by: self  PERTINENT HISTORY:    Pt is  a pleasant 27 y/o female with CP who is returning to PT after absence and trip to [place]. She would like to improve her mobility, gait and balance. Pt has continued her PT exercises while away and worked on gait, and wants to become independent using a RW. She reports 2 recent falls, and no injuries. Pt also with stiffness in BLEs. PMH consists of the following: CP, depression, GAD, migraine headache, hx of LE surgery bilateral x2 in 2008.    PAIN: from eval Are you having pain? No  PRECAUTIONS: Fall  WEIGHT BEARING RESTRICTIONS: No  FALLS: Has patient fallen in last 6 months? Yes. Number of falls 2 when walking. She reports no injury. She got back up independently   PATIENT GOALS: Pt would like to improve her ability to ambulate with RW both inside and over outside surfaces in order to increase her independence with mobility. She would like to learn how to safely get in and out of a car with her RW.  OBJECTIVE:     INTERVENTION 04/30/23  TE: SLR: 2x10 each LE Bridges 2x10-12  STS 2x10 hands-free (pt at times achieves partial reps)  Standing with BUE support on RW: FWD step 10x each LE, BCKWD step 10x each LE, LTL step 10x each LE  Manual: PT provides long duration PROM stretching to the following BLE muscles for several minutes:  -adductors - hamstrings -gastrocs  -knee to chest stretch  Pt reports pain relief in LE following manual therapy    PATIENT EDUCATION: Education details: Pt educated throughout session about proper posture and technique with exercises. Improved  exercise technique, movement at target joints, use of target muscles after min to mod verbal, visual, tactile cues.    Person educated: Patient and Parent Education method: Explanation, Demonstration, and Verbal cues Education comprehension: verbalized understanding and returned demonstration  HOME EXERCISE PROGRAM   01/22/2023: Access Code: [email or web address] URL: [email or web address] Date: 01/22/2023 Prepared by: Temple Pacini  Exercises - Supine Bridge  - 1 x daily - 5 x weekly - 3 sets - 10 reps - Supine Straight Leg Raise  - 1 x daily - 5 x weekly - 2 sets - 10 reps - Seated Hip Abduction with Resistance  - 1 x daily - 5 x weekly - 2 sets - 10 reps  11/21/2022: Access Code: [email or web address] URL: [email or web address] Date: 11/21/2022 Prepared by: Temple Pacini  Exercises *corrected on handout (written) perform the following with UE support on counter - Standing Gastroc Stretch on Foam 1/2 Roll  - 1 x daily - 7 x weekly - 2 sets - 2 reps - 30 seconds hold - Standing Hip Flexor Stretch  - 1 x daily - 7 x weekly - 2 sets - 2 reps - 30 seconds hold  GOALS: Goals reviewed with patient? Yes  SHORT TERM GOALS: Target date: 12/03/2022  Patient will be independent in home exercise program to improve strength/mobility for better functional independence with ADLs. Baseline: to be initiated next 1-2 visits; 02/19/2023: pt not consistently performing HEP; 04/23/23:  Pt reports she goes to gym every three days Goal status: IN PROGRESS  LONG TERM GOALS: Target date: 01/14/2023  Patient will increase FOTO score to equal to or greater than  76  to demonstrate statistically significant improvement in mobility and quality of life.  Baseline: risk adjusted 51; 02/19/2023: 63 * pt reporting pain in left leg limiting stair climbing; 04/23/23: 81 Goal status: MET  2.  Pt will reduce 5xsSTS time to < 8 sec to  demonstrate clinically significant improvement in LE strength and balance to reduce risk  of falls.  Baseline: previously 12.76 sec on 06/10/22, now 10 sec to RW on 10/22/2022; 02/19/2023 8.5 seconds hands-free; 04/23/23: 7 seconds goal status: MET  3.  Pt will improve 6 MWT to > 1000' with no AD and no LOB to demonstrate ability to improve safety with community distances. Baseline: was 822 ft back in August 2023, now 808 with RW, CGA; 02/19/2023 845 ft with 4WW; 04/23/23: 820 ft 4WW  Goal status: PARTIALLY MET  4.  Pt will improve 10 m gait speed with LRAD to > 1.0 m/s to demonstrate reduced risk of falls along with safe ability to complete community walking tasks Baseline: previously 0.7 m/s on 06/10/2022, now 0.77 m/s with RW; 02/19/2023: 0.82 m/s with RW; 04/23/23: 0.95 m/s with RW Goal status: Partially MET  5.  Patient will increase Berg Balance score by > 6 points to demonstrate decreased fall risk during functional activities.  Baseline: previously 35/56 on 03/11/22(?), test deferred to next appt; 11/13/2022: 36/56; 02/19/2023: 39/56; 04/23/23: 42/56  Goal status: MET   ASSESSMENT:  CLINICAL IMPRESSION: Start of session consisted of completing manual therapy interventions to maintain flexibility/mobility of BLE and for pain modulation. Following these interventions pt did report improvement in her pain levels. She then completed multiple reps of therex, where she had the greatest difficulty with SLR. Will continue to address this deficit. The pt will continue to benefit from skilled PT services to address deficits and impairment identified in evaluation in order to maximize independence and safety in basic mobility required for performance of ADL, IADL, and leisure.     OBJECTIVE IMPAIRMENTS: Abnormal gait, decreased activity tolerance, decreased balance, decreased coordination, decreased endurance, decreased knowledge of use of DME, decreased mobility, difficulty walking, decreased strength, impaired tone, improper body mechanics, postural dysfunction, and pain.   ACTIVITY LIMITATIONS:  carrying, lifting, bending, standing, squatting, stairs, transfers, dressing, and locomotion level  PARTICIPATION LIMITATIONS: meal prep, cleaning, shopping, community activity, and yard work  PERSONAL FACTORS: Fitness, Sex, Time since onset of injury/illness/exacerbation, and 3+ comorbidities: PMH consists of the following: CP, depression, GAD, migraine headache, hx of LE surgery bilateral x2 in 2008  are also affecting patient's functional outcome.   REHAB POTENTIAL: Good  CLINICAL DECISION MAKING: Evolving/moderate complexity  EVALUATION COMPLEXITY: Moderate  PLAN:  PT FREQUENCY: 1-2x/week  PT DURATION: 12 weeks  PLANNED INTERVENTIONS: Therapeutic exercises, Therapeutic activity, Neuromuscular re-education, Balance training, Gait training, Patient/Family education, Self Care, Joint mobilization, Stair training, Vestibular training, Canalith repositioning, Visual/preceptual remediation/compensation, Orthotic/Fit training, DME instructions, Dry Needling, Electrical stimulation, Wheelchair mobility training, Spinal mobilization, Cryotherapy, Moist heat, Splintting, Taping, Biofeedback, Manual therapy, and Re-evaluation  PLAN FOR NEXT SESSION: , training with walker/transfer (how to fold walker, get in/out of car), training and uneven surfaces, LE strengthening, gait exercises/step-length/gait-speed, stretching, stairs, [employer] exercises, curbs  Baird Kay, PT 04/30/2023, 4:41 PM  4:41 PM, 04/30/23 Physical Therapist - [facility]

## 2023-05-07 ENCOUNTER — Ambulatory Visit: Payer: MEDICAID

## 2023-05-14 ENCOUNTER — Ambulatory Visit: Payer: MEDICAID

## 2023-05-14 DIAGNOSIS — M6281 Muscle weakness (generalized): Secondary | ICD-10-CM

## 2023-05-14 DIAGNOSIS — R278 Other lack of coordination: Secondary | ICD-10-CM

## 2023-05-14 DIAGNOSIS — R262 Difficulty in walking, not elsewhere classified: Secondary | ICD-10-CM | POA: Diagnosis not present

## 2023-05-14 DIAGNOSIS — M25661 Stiffness of right knee, not elsewhere classified: Secondary | ICD-10-CM

## 2023-05-14 DIAGNOSIS — M25662 Stiffness of left knee, not elsewhere classified: Secondary | ICD-10-CM

## 2023-05-14 NOTE — Therapy (Signed)
OUTPATIENT PHYSICAL THERAPY NEURO TREATMENT NOTE   Patient Name: Renee Walton MRN: [id number] DOB:[birth year], 27 y.o., female Today's Date: 05/14/2023   PCP: Renee Loveless, MD REFERRING PROVIDER:   [name], MD    END OF SESSION:  PT End of Session - 05/14/23 1714     Visit Number 18    Number of Visits 40    Date for PT Re-Evaluation 07/16/23    Authorization Type Medicaid [place] Access    PT Start Time 1402    PT Stop Time 1445    PT Time Calculation (min) 43 min    Equipment Utilized During Treatment Gait belt    Activity Tolerance Patient tolerated treatment well    Behavior During Therapy WFL for tasks assessed/performed                 Past Medical History:  Diagnosis Date   Breakthrough bleeding    Cerebral palsy (HCC)    GAD (generalized anxiety disorder)    Migraine headache    Past Surgical History:  Procedure Laterality Date   LEG SURGERY     bilateral x2 in 2008   Patient Active Problem List   Diagnosis Date Noted   Seasonal allergic rhinitis due to pollen 03/27/2023   Bronchitis 03/13/2023   Gastroesophageal reflux disease without esophagitis 02/24/2023   Migraine without aura and without status migrainosus, not intractable 02/24/2023   GAD (generalized anxiety disorder) 02/24/2023   Loss of weight 07/17/2015   Depression 07/17/2015   Cerebral palsy (HCC) 07/17/2015    ONSET DATE: 10/[birth year]/1994 pt has CP  REFERRING DIAG:   G80.9 (ICD-10-CM) - Cerebral palsy (HCC)    THERAPY DIAG:  Joint stiffness of left lower leg  Joint stiffness of right lower leg  Other lack of coordination  Difficulty in walking, not elsewhere classified  Muscle weakness (generalized)  Rationale for Evaluation and Treatment: Rehabilitation  SUBJECTIVE:                                                                                                                                                                                              SUBJECTIVE STATEMENT  Pt reports a cold sensation felt in bilat thigh to knees. She reports this has been going on for two days. Pt reports no falls since last seen. She reports she had wood floors installed in her house and she has been getting used to walking over them. She reports she felt anxiety about the change.  Pt accompanied by: self  PERTINENT HISTORY:    Pt is a pleasant 27 y/o female with CP who is returning to  PT after absence and trip to [place]. She would like to improve her mobility, gait and balance. Pt has continued her PT exercises while away and worked on gait, and wants to become independent using a RW. She reports 2 recent falls, and no injuries. Pt also with stiffness in BLEs. PMH consists of the following: CP, depression, GAD, migraine headache, hx of LE surgery bilateral x2 in 2008.    PAIN: from eval Are you having pain? No  PRECAUTIONS: Fall  WEIGHT BEARING RESTRICTIONS: No  FALLS: Has patient fallen in last 6 months? Yes. Number of falls 2 when walking. She reports no injury. She got back up independently   PATIENT GOALS: Pt would like to improve her ability to ambulate with RW both inside and over outside surfaces in order to increase her independence with mobility. She would like to learn how to safely get in and out of a car with her RW.  OBJECTIVE:     INTERVENTION 05/14/23  TA: x 6 min OT assists with session to help PT provide instruction/education on different kinds of adaptive shoes laces available to pt and how they would be used. Pt verbalized understanding for all.  TE: on plinth SLR: 2x15 each LE with some assist from PT to attend to task  Manual: PT provides long duration PROM and stretching to the following BLE muscles for several minutes:  -adductors - hamstrings -gastrocs  -knee to chest stretch PROM x multiple reps -LTR stretch  PATIENT EDUCATION: Education details: Pt educated throughout session about proper posture and technique  with exercises. Improved exercise technique, movement at target joints, use of target muscles after min to mod verbal, visual, tactile cues.  Person educated: Patient and Parent Education method: Explanation, Demonstration, and Verbal cues Education comprehension: verbalized understanding and returned demonstration  HOME EXERCISE PROGRAM   01/22/2023: Access Code: [email or web address] URL: [email or web address] Date: 01/22/2023 Prepared by: Temple Pacini  Exercises - Supine Bridge  - 1 x daily - 5 x weekly - 3 sets - 10 reps - Supine Straight Leg Raise  - 1 x daily - 5 x weekly - 2 sets - 10 reps - Seated Hip Abduction with Resistance  - 1 x daily - 5 x weekly - 2 sets - 10 reps  11/21/2022: Access Code: [email or web address] URL: [email or web address] Date: 11/21/2022 Prepared by: Temple Pacini  Exercises *corrected on handout (written) perform the following with UE support on counter - Standing Gastroc Stretch on Foam 1/2 Roll  - 1 x daily - 7 x weekly - 2 sets - 2 reps - 30 seconds hold - Standing Hip Flexor Stretch  - 1 x daily - 7 x weekly - 2 sets - 2 reps - 30 seconds hold  GOALS: Goals reviewed with patient? Yes  SHORT TERM GOALS: Target date: 12/03/2022  Patient will be independent in home exercise program to improve strength/mobility for better functional independence with ADLs. Baseline: to be initiated next 1-2 visits; 02/19/2023: pt not consistently performing HEP; 04/23/23:  Pt reports she goes to gym every three days Goal status: IN PROGRESS  LONG TERM GOALS: Target date: 01/14/2023  Patient will increase FOTO score to equal to or greater than  76  to demonstrate statistically significant improvement in mobility and quality of life.  Baseline: risk adjusted 51; 02/19/2023: 63 * pt reporting pain in left leg limiting stair climbing; 04/23/23: 81 Goal status: MET  2.  Pt will reduce 5xsSTS time to < 8 sec to demonstrate clinically significant improvement in LE  strength and  balance to reduce risk of falls.  Baseline: previously 12.76 sec on 06/10/22, now 10 sec to RW on 10/22/2022; 02/19/2023 8.5 seconds hands-free; 04/23/23: 7 seconds goal status: MET  3.  Pt will improve 6 MWT to > 1000' with no AD and no LOB to demonstrate ability to improve safety with community distances. Baseline: was 822 ft back in August 2023, now 808 with RW, CGA; 02/19/2023 845 ft with 4WW; 04/23/23: 820 ft 4WW  Goal status: PARTIALLY MET  4.  Pt will improve 10 m gait speed with LRAD to > 1.0 m/s to demonstrate reduced risk of falls along with safe ability to complete community walking tasks Baseline: previously 0.7 m/s on 06/10/2022, now 0.77 m/s with RW; 02/19/2023: 0.82 m/s with RW; 04/23/23: 0.95 m/s with RW Goal status: Partially MET  5.  Patient will increase Berg Balance score by > 6 points to demonstrate decreased fall risk during functional activities.  Baseline: previously 35/56 on 03/11/22(?), test deferred to next appt; 11/13/2022: 36/56; 02/19/2023: 39/56; 04/23/23: 42/56  Goal status: MET   ASSESSMENT:  CLINICAL IMPRESSION: Pt provided instruction in adaptive shoe laces available. Pt verbalized understanding of choices and verbalized she would like to pursue this. Remainder of appointment focused on stretching to maximize functional mobility. PT did have to help pt attend to task with cuing throughout, but pt does appear enthusiastic to participate in session.The pt will continue to benefit from skilled PT services to address deficits and impairment identified in evaluation in order to maximize independence and safety in basic mobility required for performance of ADL, IADL, and leisure.     OBJECTIVE IMPAIRMENTS: Abnormal gait, decreased activity tolerance, decreased balance, decreased coordination, decreased endurance, decreased knowledge of use of DME, decreased mobility, difficulty walking, decreased strength, impaired tone, improper body mechanics, postural dysfunction, and pain.    ACTIVITY LIMITATIONS: carrying, lifting, bending, standing, squatting, stairs, transfers, dressing, and locomotion level  PARTICIPATION LIMITATIONS: meal prep, cleaning, shopping, community activity, and yard work  PERSONAL FACTORS: Fitness, Sex, Time since onset of injury/illness/exacerbation, and 3+ comorbidities: PMH consists of the following: CP, depression, GAD, migraine headache, hx of LE surgery bilateral x2 in 2008  are also affecting patient's functional outcome.   REHAB POTENTIAL: Good  CLINICAL DECISION MAKING: Evolving/moderate complexity  EVALUATION COMPLEXITY: Moderate  PLAN:  PT FREQUENCY: 1-2x/week  PT DURATION: 12 weeks  PLANNED INTERVENTIONS: Therapeutic exercises, Therapeutic activity, Neuromuscular re-education, Balance training, Gait training, Patient/Family education, Self Care, Joint mobilization, Stair training, Vestibular training, Canalith repositioning, Visual/preceptual remediation/compensation, Orthotic/Fit training, DME instructions, Dry Needling, Electrical stimulation, Wheelchair mobility training, Spinal mobilization, Cryotherapy, Moist heat, Splintting, Taping, Biofeedback, Manual therapy, and Re-evaluation  PLAN FOR NEXT SESSION: , training with walker/transfer (how to fold walker, get in/out of car), training and uneven surfaces, LE strengthening, gait exercises/step-length/gait-speed, stretching, stairs, [employer] exercises, curbs  Baird Kay, PT 05/14/2023, 5:30 PM  5:30 PM, 05/14/23 Physical Therapist - [facility]

## 2023-05-21 ENCOUNTER — Ambulatory Visit: Payer: MEDICAID

## 2023-05-22 ENCOUNTER — Ambulatory Visit: Payer: MEDICAID | Attending: Internal Medicine | Admitting: Physical Therapy

## 2023-05-22 DIAGNOSIS — G809 Cerebral palsy, unspecified: Secondary | ICD-10-CM | POA: Diagnosis present

## 2023-05-22 DIAGNOSIS — R278 Other lack of coordination: Secondary | ICD-10-CM | POA: Diagnosis present

## 2023-05-22 DIAGNOSIS — M79605 Pain in left leg: Secondary | ICD-10-CM

## 2023-05-22 DIAGNOSIS — M5459 Other low back pain: Secondary | ICD-10-CM | POA: Diagnosis present

## 2023-05-22 DIAGNOSIS — M25662 Stiffness of left knee, not elsewhere classified: Secondary | ICD-10-CM

## 2023-05-22 DIAGNOSIS — R2681 Unsteadiness on feet: Secondary | ICD-10-CM

## 2023-05-22 DIAGNOSIS — R2689 Other abnormalities of gait and mobility: Secondary | ICD-10-CM

## 2023-05-22 DIAGNOSIS — M6281 Muscle weakness (generalized): Secondary | ICD-10-CM | POA: Diagnosis present

## 2023-05-22 DIAGNOSIS — M25661 Stiffness of right knee, not elsewhere classified: Secondary | ICD-10-CM

## 2023-05-22 DIAGNOSIS — R262 Difficulty in walking, not elsewhere classified: Secondary | ICD-10-CM | POA: Diagnosis present

## 2023-05-22 NOTE — Therapy (Addendum)
OUTPATIENT PHYSICAL THERAPY NEURO TREATMENT NOTE   Patient Name: Renee Walton MRN: [id number] DOB:[birth year], 27 y.o., female Today's Date: 05/22/2023   PCP: [name], MD REFERRING PROVIDER:   [name], MD    END OF SESSION:  PT End of Session - 05/22/23 1423     Visit Number 19    Number of Visits 40    Date for PT Re-Evaluation 07/16/23    Authorization Type Medicaid [place] Access    Authorization Time Period 02/25/22-06/02/22 for 12 visits    PT Start Time 1415    PT Stop Time 1500    PT Time Calculation (min) 45 min    Equipment Utilized During Treatment Gait belt    Activity Tolerance Patient tolerated treatment well    Behavior During Therapy WFL for tasks assessed/performed                 Past Medical History:  Diagnosis Date   Breakthrough bleeding    Cerebral palsy (HCC)    GAD (generalized anxiety disorder)    Migraine headache    Past Surgical History:  Procedure Laterality Date   LEG SURGERY     bilateral x2 in 2008   Patient Active Problem List   Diagnosis Date Noted   Seasonal allergic rhinitis due to pollen 03/27/2023   Bronchitis 03/13/2023   Gastroesophageal reflux disease without esophagitis 02/24/2023   Migraine without aura and without status migrainosus, not intractable 02/24/2023   GAD (generalized anxiety disorder) 02/24/2023   Loss of weight 07/17/2015   Depression 07/17/2015   Cerebral palsy (HCC) 07/17/2015    ONSET DATE: 06/29[birth year] pt has CP  REFERRING DIAG:   G80.9 (ICD-10-CM) - Cerebral palsy (HCC)    THERAPY DIAG:  Joint stiffness of left lower leg  Joint stiffness of right lower leg  Other lack of coordination  Difficulty in walking, not elsewhere classified  Muscle weakness (generalized)  Pain in left leg  Unsteadiness on feet  Other low back pain  Cerebral palsy, unspecified type (HCC)  Other abnormalities of gait and mobility  Rationale for Evaluation and Treatment:  Rehabilitation  SUBJECTIVE:                                                                                                                                                                                             SUBJECTIVE STATEMENT    Pt has constipation and has bowel movements every 4 days. Pt has to push and it is painful. Yesterday she had a BM and today she is still having pain at the rectum.  Pt has Type 1 stool consistency based  on Bristol stool scale 100% and her mom has to put boiled water to soften the stool to flush every time.  Pt has met with a nutritionist . Pt started to drink 32 fl water per day, no sodas, not teas.  Pt eats veggies.    Pt accompanied by: mother   PERTINENT HISTORY:    Pt is a pleasant 27 y/o female with CP who is returning to PT after absence and trip to [place]. She would like to improve her mobility, gait and balance. Pt has continued her PT exercises while away and worked on gait, and wants to become independent using a RW. She reports 2 recent falls, and no injuries. Pt also with stiffness in BLEs. PMH consists of the following: CP, depression, GAD, migraine headache, hx of LE surgery bilateral x2 in 2008.    PAIN: from eval Are you having pain? No  PRECAUTIONS: Fall  WEIGHT BEARING RESTRICTIONS: No  FALLS: Has patient fallen in last 6 months? Yes. Number of falls 2 when walking. She reports no injury. She got back up independently   PATIENT GOALS: Pt would like to improve her ability to ambulate with RW both inside and over outside surfaces in order to increase her independence with mobility. She would like to learn how to safely get in and out of a car with her RW.  OBJECTIVE:    [facility] PT Assessment -      Observation/Other Assessments   Observations simulated toileting posture: pt demo'd leaning back to have BMs,      Posture/Postural Control   Posture Comments slumped sitting, able to achieve lat scoots along plinth but required cues for  feet stabilization             Pelvic Floor Special Questions -    External Palpation through clothing: flexed coccyx , hypomobile SIJ              [facility] Adult PT Treatment/Exercise -       Therapeutic Activites    Therapeutic Activities Other Therapeutic Activities    Other Therapeutic Activities explained anatomy and role of anterior tilt of pelvis with more upright posture to promote lengthening of pelvic floor which helps with elimination of bowels. Explained flexed coccyx playing a role on bowel elimination. Explained modification to sitting while watching TV to minimize slouching.      Neuro Re-ed    Neuro Re-ed Details  Cued for lat scoots to promote posterior strenghtening, cued for use of squatty potty, stabilization of one UE to lift leg onto [facility] with exhalation, use of yoga block to promote hip abduction for toileting,      Manual Therapy   Manual therapy comments prone with pilow under belly,  superior glides lateral borders to coccyx to promote less flexed tailbone,             PATIENT EDUCATION: Education details: Pt educated throughout session about proper posture and technique with exercises. Improved exercise technique, movement at target joints, use of target muscles after min to mod verbal, visual, tactile cues.  Person educated: Patient and Parent Education method: Explanation, Demonstration, and Verbal cues Education comprehension: verbalized understanding and returned demonstration  HOME EXERCISE PROGRAM See pt instruction section   GOALS: Goals reviewed with patient? Yes  SHORT TERM GOALS: Target date: 12/03/2022  Patient will be independent in home exercise program to improve strength/mobility for better functional independence with ADLs. Baseline: to be initiated next 1-2 visits; 02/19/2023: pt not consistently performing  HEP; 04/23/23:  Pt reports she goes to gym every three days Goal status: IN PROGRESS  LONG TERM GOALS: Target  date: 07/16/2023   Patient will increase FOTO score to equal to or greater than  76  to demonstrate statistically significant improvement in mobility and quality of life.  Baseline: risk adjusted 51; 02/19/2023: 63 * pt reporting pain in left leg limiting stair climbing; 04/23/23: 81 Goal status: MET  2.  Pt will reduce 5xsSTS time to < 8 sec to demonstrate clinically significant improvement in LE strength and balance to reduce risk of falls.  Baseline: previously 12.76 sec on 06/10/22, now 10 sec to RW on 10/22/2022; 02/19/2023 8.5 seconds hands-free; 04/23/23: 7 seconds goal status: MET  3.  Pt will improve 6 MWT to > 1000' with no AD and no LOB to demonstrate ability to improve safety with community distances. Baseline: was 822 ft back in August 2023, now 808 with RW, CGA; 02/19/2023 845 ft with 4WW; 04/23/23: 820 ft 4WW  Goal status: PARTIALLY MET  4.  Pt will improve 10 m gait speed with LRAD to > 1.0 m/s to demonstrate reduced risk of falls along with safe ability to complete community walking tasks Baseline: previously 0.7 m/s on 06/10/2022, now 0.77 m/s with RW; 02/19/2023: 0.82 m/s with RW; 04/23/23: 0.95 m/s with RW Goal status: Partially MET  5.  Patient will increase Berg Balance score by > 6 points to demonstrate decreased fall risk during functional activities.  Baseline: previously 35/56 on 03/11/22(?), test deferred to next appt; 11/13/2022: 36/56; 02/19/2023: 39/56; 04/23/23: 42/56  Goal status: MET  6.  Pt will report improved stool consistency type 4 across 50% of the time and demo less flexed coccyx across 2 weeks in order to promote bowel elimination  Baseline: Pt has Type 1 stool consistency based on Bristol stool scale 100% and her mom has to put boiled water to soften the stool to flush every time.  Coccyx flexed  Goal status: NEW    ASSESSMENT:  CLINICAL IMPRESSION:  Pelvic PT assessed pt's issue with bowel elimination as referred by current treating PT. Contributing factors include:  slouched posture, flexed coccyx, posterior tilt of pelvis, weak deep core (intraabdominal pressure system)  system, long periods of time watching TV seated.  Manual Tx addressed flexed coccyx after which pt reported 50% improved pain.   Explained anatomy and role of anterior tilt of pelvis with more upright posture to promote lengthening of pelvic floor which helps with elimination of bowels. Explained flexed coccyx playing a role on bowel elimination. Explained modification to sitting while watching TV to minimize slouching.  Cued for lat scoots to promote posterior strenghtening, cued for use of squatty potty, stabilization of one UE to lift leg onto [facility] with exhalation, use of yoga block to promote hip abduction for toileting.  Pelvic PT spoke with other PT who will treat her next week and explained areas to focus for promoting more anterior tilt of pelvis, upright posture, strengthening posterior chain of back and gluts.  Plan to continue with pelvic PT every other week. Plan to add more vocalization with use of glottis for postural stability at upcoming sessions ( based on [facility] research).   PT did have to help pt attend to task with cuing throughout, but pt does appear enthusiastic to participate in session.The pt will continue to benefit from skilled PT services to address deficits and impairment identified in evaluation in order to maximize independence and safety in  basic mobility required for performance of ADL, IADL, and leisure.     OBJECTIVE IMPAIRMENTS: Abnormal gait, decreased activity tolerance, decreased balance, decreased coordination, decreased endurance, decreased knowledge of use of DME, decreased mobility, difficulty walking, decreased strength, impaired tone, improper body mechanics, postural dysfunction, and pain.   ACTIVITY LIMITATIONS: carrying, lifting, bending, standing, squatting, stairs, transfers, dressing, and locomotion level  PARTICIPATION  LIMITATIONS: meal prep, cleaning, shopping, community activity, and yard work  PERSONAL FACTORS: Fitness, Sex, Time since onset of injury/illness/exacerbation, and 3+ comorbidities: PMH consists of the following: CP, depression, GAD, migraine headache, hx of LE surgery bilateral x2 in 2008  are also affecting patient's functional outcome.   REHAB POTENTIAL: Good  CLINICAL DECISION MAKING: Evolving/moderate complexity  EVALUATION COMPLEXITY: Moderate  PLAN:  PT FREQUENCY: 1-2x/week  PT DURATION: 12 weeks  PLANNED INTERVENTIONS: Therapeutic exercises, Therapeutic activity, Neuromuscular re-education, Balance training, Gait training, Patient/Family education, Self Care, Joint mobilization, Stair training, Vestibular training, Canalith repositioning, Visual/preceptual remediation/compensation, Orthotic/Fit training, DME instructions, Dry Needling, Electrical stimulation, Wheelchair mobility training, Spinal mobilization, Cryotherapy, Moist heat, Splintting, Taping, Biofeedback, Manual therapy, and Re-evaluation  PLAN FOR NEXT SESSION: , training with walker/transfer (how to fold walker, get in/out of car), training and uneven surfaces, LE strengthening, gait exercises/step-length/gait-speed, stretching, stairs, [employer] exercises, curbs  Tea, PT 05/22/2023, 2:23 PM  2:23 PM, 05/22/23 Physical Therapist - [facility]

## 2023-05-22 NOTE — Patient Instructions (Signed)
Use squatty potty with yoga block between knees for optimal pelvic floor lengthening for better bowel movements   Use hand on toilet seat for stability while other hand lifts other hip up  ______  Practice anterior tilt of pelvis in seated position with yoga block between knees , feet on floor:  - brushing hand on thigh and chest lifts  10 reps  -hands by hip flat,  fingers point back to sit tall   - twist

## 2023-05-28 ENCOUNTER — Ambulatory Visit: Payer: MEDICAID

## 2023-05-28 DIAGNOSIS — R2681 Unsteadiness on feet: Secondary | ICD-10-CM

## 2023-05-28 DIAGNOSIS — R262 Difficulty in walking, not elsewhere classified: Secondary | ICD-10-CM

## 2023-05-28 DIAGNOSIS — M25662 Stiffness of left knee, not elsewhere classified: Secondary | ICD-10-CM

## 2023-05-28 DIAGNOSIS — R278 Other lack of coordination: Secondary | ICD-10-CM

## 2023-05-28 DIAGNOSIS — M6281 Muscle weakness (generalized): Secondary | ICD-10-CM

## 2023-05-28 DIAGNOSIS — M25661 Stiffness of right knee, not elsewhere classified: Secondary | ICD-10-CM

## 2023-05-28 NOTE — Therapy (Unsigned)
OUTPATIENT PHYSICAL THERAPY NEURO TREATMENT NOTE/Physical Therapy Progress Note   Dates of reporting period  02/19/2023   to   05/28/2023    Patient Name: Renee Walton MRN: [id number] DOB:12-24-1995, 27 y.o., female Today's Date: 05/29/2023   PCP: [name], MD REFERRING PROVIDER:   [name], MD    END OF SESSION:  PT End of Session - 05/28/23 1400     Visit Number 20    Number of Visits 40    Date for PT Re-Evaluation 07/16/23    Authorization Type Medicaid [place] Access    Authorization Time Period 02/25/22-06/02/22 for 12 visits    PT Start Time 1400    PT Stop Time 1443    PT Time Calculation (min) 43 min    Equipment Utilized During Treatment Gait belt    Activity Tolerance Patient tolerated treatment well    Behavior During Therapy WFL for tasks assessed/performed                 Past Medical History:  Diagnosis Date   Breakthrough bleeding    Cerebral palsy (HCC)    GAD (generalized anxiety disorder)    Migraine headache    Past Surgical History:  Procedure Laterality Date   LEG SURGERY     bilateral x2 in 2008   Patient Active Problem List   Diagnosis Date Noted   Seasonal allergic rhinitis due to pollen 03/27/2023   Bronchitis 03/13/2023   Gastroesophageal reflux disease without esophagitis 02/24/2023   Migraine without aura and without status migrainosus, not intractable 02/24/2023   GAD (generalized anxiety disorder) 02/24/2023   Loss of weight 07/17/2015   Depression 07/17/2015   Cerebral palsy (HCC) 07/17/2015    ONSET DATE: Feb 19, 1994 pt has CP  REFERRING DIAG:   G80.9 (ICD-10-CM) - Cerebral palsy (HCC)    THERAPY DIAG:  Joint stiffness of left lower leg  Joint stiffness of right lower leg  Muscle weakness (generalized)  Difficulty in walking, not elsewhere classified  Other lack of coordination  Unsteadiness on feet  Rationale for Evaluation and Treatment: Rehabilitation  SUBJECTIVE:                                                                                                                                                                                              SUBJECTIVE STATEMENT    Pt reports initial pain improvement following her pelvic PT session that lasted for several days. She reports pain started increasing Monday again in L lateral leg, groin and into her tailbone. She describes the pain as a tightness. Pt reports 1-2 falls since last seen, but  then says they were not "full falls" and that she caught herself.  Pt accompanied by: mother   PERTINENT HISTORY:    Pt is a pleasant 27 y/o female with CP who is returning to PT after absence and trip to [place]. She would like to improve her mobility, gait and balance. Pt has continued her PT exercises while away and worked on gait, and wants to become independent using a RW. She reports 2 recent falls, and no injuries. Pt also with stiffness in BLEs. PMH consists of the following: CP, depression, GAD, migraine headache, hx of LE surgery bilateral x2 in 2008.    PAIN: from eval Are you having pain? No  PRECAUTIONS: Fall  WEIGHT BEARING RESTRICTIONS: No  FALLS: Has patient fallen in last 6 months? Yes. Number of falls 2 when walking. She reports no injury. She got back up independently   PATIENT GOALS: Pt would like to improve her ability to ambulate with RW both inside and over outside surfaces in order to increase her independence with mobility. She would like to learn how to safely get in and out of a car with her RW.  OBJECTIVE:   Today's Treatment: 05/29/23  TA: Goal reassessment completed on this date. Please refer to goal section and assessment below for details.   TE: LTL scooting on mat table 4x using UE/LE to push.  On mat table: Supported on forearms in prone for stretch - x 3 min Unable to perform prone hamstring curl or LE ext due to tone/spasticity with multiple unsuccessful attempts Glute bridge  15x   Manual: On mat table: PT provides long duration stretching for the following mm  Hip flexor  and quad stretch with LE off side of mat table (performed each side). Performed additionally to promote lumbar ext/APT. Pt reports stretch is relieving pain  Hamstring stretch bilat LE Adductor stretch bilat LE   PATIENT EDUCATION: Education details: Pt educated throughout session about proper posture and technique with exercises. Improved exercise technique, movement at target joints, use of target muscles after min to mod verbal, visual, tactile cues. Goals  Person educated: Patient Education method: Explanation, Demonstration, and Verbal cues Education comprehension: verbalized understanding and returned demonstration  HOME EXERCISE PROGRAM See pt instruction section   GOALS: Goals reviewed with patient? Yes  SHORT TERM GOALS: Target date: 12/03/2022  Patient will be independent in home exercise program to improve strength/mobility for better functional independence with ADLs. Baseline: to be initiated next 1-2 visits; 02/19/2023: pt not consistently performing HEP; 04/23/23:  Pt reports she goes to gym every three days; 8/7: Pt reports she has been increasing her activity levels, but currently waiting for caregiver to start to be able to go to gym Goal status: IN PROGRESS  LONG TERM GOALS: Target date: 07/16/2023   Patient will increase FOTO score to equal to or greater than  76  to demonstrate statistically significant improvement in mobility and quality of life.  Baseline: risk adjusted 51; 02/19/2023: 63 * pt reporting pain in left leg limiting stair climbing; 04/23/23: 81 Goal status: MET  2.  Pt will reduce 5xsSTS time to < 8 sec to demonstrate clinically significant improvement in LE strength and balance to reduce risk of falls.  Baseline: previously 12.76 sec on 06/10/22, now 10 sec to RW on 10/22/2022; 02/19/2023 8.5 seconds hands-free; 04/23/23: 7 seconds goal status: MET  3.  Pt will  improve 6 MWT to > 1000' with no AD and no LOB to demonstrate ability to  improve safety with community distances. Baseline: was 822 ft back in August 2023, now 808 with RW, CGA; 02/19/2023 845 ft with 4WW; 04/23/23: 820 ft 4WW; 05/28/23:  728 ft with RW, limited by LLE pain Goal status: PARTIALLY MET  4.  Pt will improve 10 m gait speed with LRAD to > 1.0 m/s to demonstrate reduced risk of falls along with safe ability to complete community walking tasks Baseline: previously 0.7 m/s on 06/10/2022, now 0.77 m/s with RW; 02/19/2023: 0.82 m/s with RW; 04/23/23: 0.95 m/s with RW; 05/28/23: 0.83 m/s pain limited LLE Goal status: Partially MET  5.  Patient will increase Berg Balance score by > 6 points to demonstrate decreased fall risk during functional activities.  Baseline: previously 35/56 on 03/11/22(?), test deferred to next appt; 11/13/2022: 36/56; 02/19/2023: 39/56; 04/23/23: 42/56  Goal status: MET  6.  Pt will report improved stool consistency type 4 across 50% of the time and demo less flexed coccyx across 2 weeks in order to promote bowel elimination  Baseline: Pt has Type 1 stool consistency based on Bristol stool scale 100% and her mom has to put boiled water to soften the stool to flush every time.  Coccyx flexed  Goal status: NEW    ASSESSMENT:  CLINICAL IMPRESSION: Goal reassessment completed for progress note. Pt with slight decrease in performance today on and , likely impacted by increased LE and groin pain that pt reported at start of PT and during assessment. Plan to retest future visits to determine if pain impacting score or if pt reaching plateau. I suspect pain is likely the main reason for decreased performance today. Patient's condition has the potential to improve in response to therapy. Maximum improvement is yet to be obtained. The anticipated improvement is attainable and reasonable in a generally predictable time. The pt will benefit from further skilled PT to improve balance,  gait and mobility.   OBJECTIVE IMPAIRMENTS: Abnormal gait, decreased activity tolerance, decreased balance, decreased coordination, decreased endurance, decreased knowledge of use of DME, decreased mobility, difficulty walking, decreased strength, impaired tone, improper body mechanics, postural dysfunction, and pain.   ACTIVITY LIMITATIONS: carrying, lifting, bending, standing, squatting, stairs, transfers, dressing, and locomotion level  PARTICIPATION LIMITATIONS: meal prep, cleaning, shopping, community activity, and yard work  PERSONAL FACTORS: Fitness, Sex, Time since onset of injury/illness/exacerbation, and 3+ comorbidities: PMH consists of the following: CP, depression, GAD, migraine headache, hx of LE surgery bilateral x2 in 2008  are also affecting patient's functional outcome.   REHAB POTENTIAL: Good  CLINICAL DECISION MAKING: Evolving/moderate complexity  EVALUATION COMPLEXITY: Moderate  PLAN:  PT FREQUENCY: 1-2x/week  PT DURATION: 12 weeks  PLANNED INTERVENTIONS: Therapeutic exercises, Therapeutic activity, Neuromuscular re-education, Balance training, Gait training, Patient/Family education, Self Care, Joint mobilization, Stair training, Vestibular training, Canalith repositioning, Visual/preceptual remediation/compensation, Orthotic/Fit training, DME instructions, Dry Needling, Electrical stimulation, Wheelchair mobility training, Spinal mobilization, Cryotherapy, Moist heat, Splintting, Taping, Biofeedback, Manual therapy, and Re-evaluation  PLAN FOR NEXT SESSION: , training with walker/transfer (how to fold walker, get in/out of car), training and uneven surfaces, LE strengthening, gait exercises/step-length/gait-speed, stretching, stairs, [employer] exercises, curbs  Baird Kay, PT 05/29/2023, 11:39 AM  11:39 AM, 05/29/23 Physical Therapist - [facility]

## 2023-06-04 ENCOUNTER — Ambulatory Visit: Payer: MEDICAID | Admitting: Physical Therapy

## 2023-06-04 DIAGNOSIS — R2689 Other abnormalities of gait and mobility: Secondary | ICD-10-CM

## 2023-06-04 DIAGNOSIS — M5459 Other low back pain: Secondary | ICD-10-CM

## 2023-06-04 DIAGNOSIS — M6281 Muscle weakness (generalized): Secondary | ICD-10-CM

## 2023-06-04 DIAGNOSIS — R2681 Unsteadiness on feet: Secondary | ICD-10-CM

## 2023-06-04 DIAGNOSIS — M25662 Stiffness of left knee, not elsewhere classified: Secondary | ICD-10-CM

## 2023-06-04 DIAGNOSIS — G809 Cerebral palsy, unspecified: Secondary | ICD-10-CM

## 2023-06-04 DIAGNOSIS — R262 Difficulty in walking, not elsewhere classified: Secondary | ICD-10-CM

## 2023-06-04 DIAGNOSIS — M25661 Stiffness of right knee, not elsewhere classified: Secondary | ICD-10-CM

## 2023-06-04 DIAGNOSIS — M79605 Pain in left leg: Secondary | ICD-10-CM

## 2023-06-04 DIAGNOSIS — R278 Other lack of coordination: Secondary | ICD-10-CM

## 2023-06-04 NOTE — Therapy (Addendum)
OUTPATIENT PHYSICAL THERAPY NEURO TREATMENT NOTE    Patient Name: Kea Temkin MRN: [id number] DOB:22-Jun-1996, 27 y.o., female Today's Date: 06/04/2023   PCP: [name], MD REFERRING PROVIDER:   [name], MD    END OF SESSION:  PT End of Session - 06/04/23 1342     Visit Number 21    Number of Visits 40    Date for PT Re-Evaluation 07/16/23    Authorization Type Medicaid [place] Access    Authorization Time Period 02/25/22-06/02/22 for 12 visits    PT Start Time 1335    PT Stop Time 1415    PT Time Calculation (min) 40 min    Equipment Utilized During Treatment Gait belt    Activity Tolerance Patient tolerated treatment well    Behavior During Therapy WFL for tasks assessed/performed                 Past Medical History:  Diagnosis Date   Breakthrough bleeding    Cerebral palsy (HCC)    GAD (generalized anxiety disorder)    Migraine headache    Past Surgical History:  Procedure Laterality Date   LEG SURGERY     bilateral x2 in 2008   Patient Active Problem List   Diagnosis Date Noted   Seasonal allergic rhinitis due to pollen 03/27/2023   Bronchitis 03/13/2023   Gastroesophageal reflux disease without esophagitis 02/24/2023   Migraine without aura and without status migrainosus, not intractable 02/24/2023   GAD (generalized anxiety disorder) 02/24/2023   Loss of weight 07/17/2015   Depression 07/17/2015   Cerebral palsy (HCC) 07/17/2015    ONSET DATE: 09[birth year]03[birth year]1994 pt has CP  REFERRING DIAG:   G80.9 (ICD-10-CM) - Cerebral palsy (HCC)    THERAPY DIAG:  Joint stiffness of left lower leg  Muscle weakness (generalized)  Joint stiffness of right lower leg  Difficulty in walking, not elsewhere classified  Other lack of coordination  Unsteadiness on feet  Pain in left leg  Other low back pain  Cerebral palsy, unspecified type (HCC)  Other abnormalities of gait and mobility  Rationale for Evaluation and Treatment:  Rehabilitation  SUBJECTIVE:                                                                                                                                                                                             SUBJECTIVE STATEMENT    Pt reports she felt pain around the L groin with radiating pain to L thigh at 4/10 after last Pelvic session. Today, the pain is 2/10 and it still radiates to the thigh when she goes to the bathroom. Marland Kitchen  For the past 2 weeks since last Pelvic PT session, pt reports decreased straining with bowel movements from 100% of the time to 30% of the time. The stools are still little balls and her mom still has to use warm water to flush them down.    Pt has decreased her sitting time watching TV and is walking more.   Pt accompanied by: mother   PERTINENT HISTORY:    Pt is a pleasant 27 y/o female with CP who is returning to PT after absence and trip to [place]. She would like to improve her mobility, gait and balance. Pt has continued her PT exercises while away and worked on gait, and wants to become independent using a RW. She reports 2 recent falls, and no injuries. Pt also with stiffness in BLEs. PMH consists of the following: CP, depression, GAD, migraine headache, hx of LE surgery bilateral x2 in 2008.    PAIN: from eval Are you having pain? No  PRECAUTIONS: Fall  WEIGHT BEARING RESTRICTIONS: No  FALLS: Has patient fallen in last 6 months? Yes. Number of falls 2 when walking. She reports no injury. She got back up independently   PATIENT GOALS: Pt would like to improve her ability to ambulate with RW both inside and over outside surfaces in order to increase her independence with mobility. She would like to learn how to safely get in and out of a car with her RW.  OBJECTIVE:   Today's Treatment: 06/04/23    [facility] PT Assessment - 06/04/23 1416       Palpation   SI assessment  L pelvic rotation / thoracic anteriorly,    Palpation comment deviated  C/T junction  , tightness along occiput L. R, pytergoids, Scalenes , L, tightness / tenderness along interspinal. medial scapla paraspinals             [facility] Adult PT Treatment/Exercise - 06/04/23 1416       Therapeutic Activites    Other Therapeutic Activities explained to mother how to assist with supine relaxtion practice, cued for pt to not lift head to roll but to bend knees then roll , plan to assist with scooting up and down in bed at next session to mionimzie strianing pelvic floor  and straining neck, applied shoe lift into R shoe toe box and heel,      Neuro Re-ed    Neuro Re-ed Details  cued for neck ROM and scapular mobility to realign spine      Modalities   Modalities Moist Heat      Moist Heat Therapy   Number Minutes Moist Heat 5 Minutes    Moist Heat Location --   throacic ( guided relaxation) , educated mom for positioning to assist pt on legs in ER with pilow under knees     Manual Therapy   Manual therapy comments Supine: distraction at occiput, STM<?MWM at problem areas noted in asssessment to realign C/T junction, improve L thoracic alignment , long axis distraction at LLE              PATIENT EDUCATION: Education details: Pt educated throughout session about proper posture and technique with exercises. Improved exercise technique, movement at target joints, use of target muscles after min to mod verbal, visual, tactile cues. Goals  Person educated: Patient Education method: Explanation, Demonstration, and Verbal cues Education comprehension: verbalized understanding and returned demonstration  HOME EXERCISE PROGRAM See pt instruction section   GOALS: Goals reviewed with patient? Yes  SHORT  TERM GOALS: Target date: 12/03/2022  Patient will be independent in home exercise program to improve strength/mobility for better functional independence with ADLs. Baseline: to be initiated next 1-2 visits; 02/19/2023: pt not consistently performing HEP; 04/23/23:   Pt reports she goes to gym every three days; 8/7: Pt reports she has been increasing her activity levels, but currently waiting for caregiver to start to be able to go to gym Goal status: IN PROGRESS  LONG TERM GOALS: Target date: 07/16/2023   Patient will increase FOTO score to equal to or greater than  76  to demonstrate statistically significant improvement in mobility and quality of life.  Baseline: risk adjusted 51; 02/19/2023: 63 * pt reporting pain in left leg limiting stair climbing; 04/23/23: 81 Goal status: MET  2.  Pt will reduce 5xsSTS time to < 8 sec to demonstrate clinically significant improvement in LE strength and balance to reduce risk of falls.  Baseline: previously 12.76 sec on 06/10/22, now 10 sec to RW on 10/22/2022; 02/19/2023 8.5 seconds hands-free; 04/23/23: 7 seconds goal status: MET  3.  Pt will improve 6 MWT to > 1000' with no AD and no LOB to demonstrate ability to improve safety with community distances. Baseline: was 822 ft back in August 2023, now 808 with RW, CGA; 02/19/2023 845 ft with 4WW; 04/23/23: 820 ft 4WW; 05/28/23:  728 ft with RW, limited by LLE pain Goal status: PARTIALLY MET  4.  Pt will improve 10 m gait speed with LRAD to > 1.0 m/s to demonstrate reduced risk of falls along with safe ability to complete community walking tasks Baseline: previously 0.7 m/s on 06/10/2022, now 0.77 m/s with RW; 02/19/2023: 0.82 m/s with RW; 04/23/23: 0.95 m/s with RW; 05/28/23: 0.83 m/s pain limited LLE Goal status: Partially MET  5.  Patient will increase Berg Balance score by > 6 points to demonstrate decreased fall risk during functional activities.  Baseline: previously 35/56 on 03/11/22(?), test deferred to next appt; 11/13/2022: 36/56; 02/19/2023: 39/56; 04/23/23: 42/56  Goal status: MET  6.  Pt will report improved stool consistency type 4 across 50% of the time and demo less flexed coccyx across 2 weeks in order to promote bowel elimination  Baseline: Pt has Type 1 stool consistency  based on Bristol stool scale 100% and her mom has to put boiled water to soften the stool to flush every time.  Coccyx flexed  Goal status: NEW    ASSESSMENT:  CLINICAL IMPRESSION:  Pelvic PT Tx from last session two weeks ago had a positive response to improving pt's ability to have bowel movements with decreased straining from 100% to 30% of the time.  Focused today on addressing L groin pain which radiated to L thigh following last Tx. Although pain subsided since two weeks ago, pt reported pain also occurs with bowel movements. Thus, pelvic alignment and spine alignment was assessed further.    Noted L iliac crest higher than R and thoracic spine rotated more anteriorly on L which is suspected to contribute to her L groin pain.   Manual Tx was applied with modifications to pressure to accommodate tenderness. Manual Tx addressed hypomobility and deviated to C/ T junction L to realignthis area along with thorax.  Cued pt on relaxation practices and neck ROM and scapular mobility HEP to promote structural realignment. Cued for diaphragmatic breathing ad use less chest/ neck mm.  Provided shoe lift in toe box and heel in R shoe which levelled pelvic girdle and pt reported  walking with ability to pick up R foot better than without shoe lift.    Anticipate levelled pelvis and spine and ability to perform diaphragmatic breathing will continue help minimize L groin pain with bowel movements, promote bowel elimination and digestion, postural alignment and stability.   Educated pt on how to perform diaphragmatic breathing which will also help with postural alignment and stability.   Patient's condition has the potential to improve in response to therapy. Maximum improvement is yet to be obtained. The anticipated improvement is attainable and reasonable in a generally predictable time. The pt will benefit from further skilled PT to improve balance, gait and mobility.   OBJECTIVE IMPAIRMENTS: Abnormal  gait, decreased activity tolerance, decreased balance, decreased coordination, decreased endurance, decreased knowledge of use of DME, decreased mobility, difficulty walking, decreased strength, impaired tone, improper body mechanics, postural dysfunction, and pain.   ACTIVITY LIMITATIONS: carrying, lifting, bending, standing, squatting, stairs, transfers, dressing, and locomotion level  PARTICIPATION LIMITATIONS: meal prep, cleaning, shopping, community activity, and yard work  PERSONAL FACTORS: Fitness, Sex, Time since onset of injury/illness/exacerbation, and 3+ comorbidities: PMH consists of the following: CP, depression, GAD, migraine headache, hx of LE surgery bilateral x2 in 2008  are also affecting patient's functional outcome.   REHAB POTENTIAL: Good  CLINICAL DECISION MAKING: Evolving/moderate complexity  EVALUATION COMPLEXITY: Moderate  PLAN:  PT FREQUENCY: 1-2x/week  PT DURATION: 12 weeks  PLANNED INTERVENTIONS: Therapeutic exercises, Therapeutic activity, Neuromuscular re-education, Balance training, Gait training, Patient/Family education, Self Care, Joint mobilization, Stair training, Vestibular training, Canalith repositioning, Visual/preceptual remediation/compensation, Orthotic/Fit training, DME instructions, Dry Needling, Electrical stimulation, Wheelchair mobility training, Spinal mobilization, Cryotherapy, Moist heat, Splintting, Taping, Biofeedback, Manual therapy, and Re-evaluation  PLAN FOR NEXT SESSION: , training with walker/transfer (how to fold walker, get in/out of car), training and uneven surfaces, LE strengthening, gait exercises/step-length/gait-speed, stretching, stairs, [employer] exercises, curbs  Mariane Masters, PT 06/04/2023, 1:43 PM  1:43 PM, 06/04/23 Physical Therapist - [facility]

## 2023-06-05 ENCOUNTER — Other Ambulatory Visit: Payer: Self-pay | Admitting: Internal Medicine

## 2023-06-11 ENCOUNTER — Ambulatory Visit: Payer: MEDICAID

## 2023-06-12 ENCOUNTER — Ambulatory Visit: Payer: MEDICAID | Admitting: Physical Therapy

## 2023-06-12 ENCOUNTER — Telehealth: Payer: Self-pay | Admitting: Physical Therapy

## 2023-06-12 NOTE — Telephone Encounter (Signed)
LVM for patient to call back if she can do August 23 at 9:30

## 2023-06-16 ENCOUNTER — Ambulatory Visit: Payer: Medicaid Other

## 2023-06-18 ENCOUNTER — Ambulatory Visit: Payer: MEDICAID

## 2023-06-18 DIAGNOSIS — M25662 Stiffness of left knee, not elsewhere classified: Secondary | ICD-10-CM

## 2023-06-18 DIAGNOSIS — M6281 Muscle weakness (generalized): Secondary | ICD-10-CM

## 2023-06-18 DIAGNOSIS — R262 Difficulty in walking, not elsewhere classified: Secondary | ICD-10-CM

## 2023-06-18 DIAGNOSIS — R278 Other lack of coordination: Secondary | ICD-10-CM

## 2023-06-18 DIAGNOSIS — M25661 Stiffness of right knee, not elsewhere classified: Secondary | ICD-10-CM

## 2023-06-18 NOTE — Therapy (Signed)
OUTPATIENT PHYSICAL THERAPY NEURO TREATMENT NOTE    Patient Name: Renee Walton MRN: [id number] DOB:20-Nov-1995, 27 y.o., female Today's Date: 06/18/2023   PCP: [name], MD REFERRING PROVIDER:   [name], MD    END OF SESSION:  PT End of Session - 06/18/23 1448     Visit Number 22    Number of Visits 40    Date for PT Re-Evaluation 07/16/23    Authorization Type Medicaid [place] Access    Authorization Time Period 02/25/22-06/02/22 for 12 visits    PT Start Time 1447    PT Stop Time 1527    PT Time Calculation (min) 40 min    Equipment Utilized During Treatment Gait belt    Activity Tolerance Patient tolerated treatment well    Behavior During Therapy WFL for tasks assessed/performed                 Past Medical History:  Diagnosis Date   Breakthrough bleeding    Cerebral palsy (HCC)    GAD (generalized anxiety disorder)    Migraine headache    Past Surgical History:  Procedure Laterality Date   LEG SURGERY     bilateral x2 in 2008   Patient Active Problem List   Diagnosis Date Noted   Seasonal allergic rhinitis due to pollen 03/27/2023   Bronchitis 03/13/2023   Gastroesophageal reflux disease without esophagitis 02/24/2023   Migraine without aura and without status migrainosus, not intractable 02/24/2023   GAD (generalized anxiety disorder) 02/24/2023   Loss of weight 07/17/2015   Depression 07/17/2015   Cerebral palsy (HCC) 07/17/2015    ONSET DATE: [birth year]1993 pt has CP  REFERRING DIAG:   G80.9 (ICD-10-CM) - Cerebral palsy (HCC)    THERAPY DIAG:  Other lack of coordination  Joint stiffness of right lower leg  Joint stiffness of left lower leg  Difficulty in walking, not elsewhere classified  Muscle weakness (generalized)  Rationale for Evaluation and Treatment: Rehabilitation  SUBJECTIVE:                                                                                                                                                                                              SUBJECTIVE STATEMENT    Pt reports some LE pain but otherwise feels she has improved. She reports her nephew was born recently and that things are going well. She reports things going well regarding ambulation with her walker and without.    Pt accompanied by: self   PERTINENT HISTORY:    Pt is a pleasant 27 y/o female with CP who is returning to PT after absence and  trip to [place]. She would like to improve her mobility, gait and balance. Pt has continued her PT exercises while away and worked on gait, and wants to become independent using a RW. She reports 2 recent falls, and no injuries. Pt also with stiffness in BLEs. PMH consists of the following: CP, depression, GAD, migraine headache, hx of LE surgery bilateral x2 in 2008.    PAIN: from eval Are you having pain? No  PRECAUTIONS: Fall  WEIGHT BEARING RESTRICTIONS: No  FALLS: Has patient fallen in last 6 months? Yes. Number of falls 2 when walking. She reports no injury. She got back up independently   PATIENT GOALS: Pt would like to improve her ability to ambulate with RW both inside and over outside surfaces in order to increase her independence with mobility. She would like to learn how to safely get in and out of a car with her RW.  OBJECTIVE:   Today's Treatment: 06/18/23  TA: PT dons adaptive laces for pt's shoes. PT then instructs pt in use and has pt practice donning and doffing shoes. Pt completes 148 ft ambulation with RW, CGA with new laces without issue or LOB. Pt verbalizes understanding of instructions and demonstrates correct technique.  Manual: PT provides the following PROM stretching/long duration stretches for pt, pt supine on plinth: Hip flexor/quad stretch bilaterally Hamstring stretch bilat Knee to chest stretch bilat Figure-four stretch bilat     PATIENT EDUCATION: Education details: Pt educated throughout session about proper posture and technique  with exercises. Improved exercise technique, movement at target joints, use of target muscles after min to mod verbal, visual, tactile cues. Goals  Person educated: Patient Education method: Explanation, Demonstration, and Verbal cues Education comprehension: verbalized understanding and returned demonstration  HOME EXERCISE PROGRAM See pt instruction section   GOALS: Goals reviewed with patient? Yes  SHORT TERM GOALS: Target date: 12/03/2022  Patient will be independent in home exercise program to improve strength/mobility for better functional independence with ADLs. Baseline: to be initiated next 1-2 visits; 02/19/2023: pt not consistently performing HEP; 04/23/23:  Pt reports she goes to gym every three days; 8/7: Pt reports she has been increasing her activity levels, but currently waiting for caregiver to start to be able to go to gym Goal status: IN PROGRESS  LONG TERM GOALS: Target date: 07/16/2023   Patient will increase FOTO score to equal to or greater than  76  to demonstrate statistically significant improvement in mobility and quality of life.  Baseline: risk adjusted 51; 02/19/2023: 63 * pt reporting pain in left leg limiting stair climbing; 04/23/23: 81 Goal status: MET  2.  Pt will reduce 5xsSTS time to < 8 sec to demonstrate clinically significant improvement in LE strength and balance to reduce risk of falls.  Baseline: previously 12.76 sec on 06/10/22, now 10 sec to RW on 10/22/2022; 02/19/2023 8.5 seconds hands-free; 04/23/23: 7 seconds goal status: MET  3.  Pt will improve 6 MWT to > 1000' with no AD and no LOB to demonstrate ability to improve safety with community distances. Baseline: was 822 ft back in August 2023, now 808 with RW, CGA; 02/19/2023 845 ft with 4WW; 04/23/23: 820 ft 4WW; 05/28/23:  728 ft with RW, limited by LLE pain Goal status: PARTIALLY MET  4.  Pt will improve 10 m gait speed with LRAD to > 1.0 m/s to demonstrate reduced risk of falls along with safe ability to  complete community walking tasks Baseline: previously 0.7 m/s on 06/10/2022, now  0.77 m/s with RW; 02/19/2023: 0.82 m/s with RW; 04/23/23: 0.95 m/s with RW; 05/28/23: 0.83 m/s pain limited LLE Goal status: Partially MET  5.  Patient will increase Berg Balance score by > 6 points to demonstrate decreased fall risk during functional activities.  Baseline: previously 35/56 on 03/11/22(?), test deferred to next appt; 11/13/2022: 36/56; 02/19/2023: 39/56; 04/23/23: 42/56  Goal status: MET  6.  Pt will report improved stool consistency type 4 across 50% of the time and demo less flexed coccyx across 2 weeks in order to promote bowel elimination  Baseline: Pt has Type 1 stool consistency based on Bristol stool scale 100% and her mom has to put boiled water to soften the stool to flush every time.  Coccyx flexed  Goal status: NEW    ASSESSMENT:  CLINICAL IMPRESSION: PT assists pt this session in inserting new adaptive laces into shoes. Pt exhibits safe gait with new laces and is able to indep doff and don her shoes during visit. Instructed pt to monitor for continued fit of shoe and to bring back in if she has any issues with her new laces. Pt verbalized understanding for all. The pt will benefit from further skilled PT to improve balance, gait and mobility.   OBJECTIVE IMPAIRMENTS: Abnormal gait, decreased activity tolerance, decreased balance, decreased coordination, decreased endurance, decreased knowledge of use of DME, decreased mobility, difficulty walking, decreased strength, impaired tone, improper body mechanics, postural dysfunction, and pain.   ACTIVITY LIMITATIONS: carrying, lifting, bending, standing, squatting, stairs, transfers, dressing, and locomotion level  PARTICIPATION LIMITATIONS: meal prep, cleaning, shopping, community activity, and yard work  PERSONAL FACTORS: Fitness, Sex, Time since onset of injury/illness/exacerbation, and 3+ comorbidities: PMH consists of the following: CP,  depression, GAD, migraine headache, hx of LE surgery bilateral x2 in 2008  are also affecting patient's functional outcome.   REHAB POTENTIAL: Good  CLINICAL DECISION MAKING: Evolving/moderate complexity  EVALUATION COMPLEXITY: Moderate  PLAN:  PT FREQUENCY: 1-2x/week  PT DURATION: 12 weeks  PLANNED INTERVENTIONS: Therapeutic exercises, Therapeutic activity, Neuromuscular re-education, Balance training, Gait training, Patient/Family education, Self Care, Joint mobilization, Stair training, Vestibular training, Canalith repositioning, Visual/preceptual remediation/compensation, Orthotic/Fit training, DME instructions, Dry Needling, Electrical stimulation, Wheelchair mobility training, Spinal mobilization, Cryotherapy, Moist heat, Splintting, Taping, Biofeedback, Manual therapy, and Re-evaluation  PLAN FOR NEXT SESSION: , training with walker/transfer (how to fold walker, get in/out of car), training and uneven surfaces, LE strengthening, gait exercises/step-length/gait-speed, stretching, stairs, [employer] exercises, curbs  Baird Kay, PT 06/18/2023, 3:38 PM  3:38 PM, 06/18/23 Physical Therapist - [facility]

## 2023-06-25 ENCOUNTER — Ambulatory Visit: Payer: MEDICAID | Attending: Internal Medicine

## 2023-06-25 DIAGNOSIS — R262 Difficulty in walking, not elsewhere classified: Secondary | ICD-10-CM | POA: Diagnosis present

## 2023-06-25 DIAGNOSIS — R2681 Unsteadiness on feet: Secondary | ICD-10-CM | POA: Diagnosis present

## 2023-06-25 DIAGNOSIS — R278 Other lack of coordination: Secondary | ICD-10-CM | POA: Diagnosis present

## 2023-06-25 DIAGNOSIS — M6281 Muscle weakness (generalized): Secondary | ICD-10-CM | POA: Diagnosis present

## 2023-06-25 NOTE — Therapy (Signed)
OUTPATIENT PHYSICAL THERAPY NEURO TREATMENT NOTE    Patient Name: Renee Walton MRN: [id number] DOB:04/01/1996, 27 y.o., female Today's Date: 06/25/2023   PCP: [name], MD REFERRING PROVIDER:   [name], MD    END OF SESSION:  PT End of Session - 06/25/23 1711     Visit Number 23    Number of Visits 40    Date for PT Re-Evaluation 07/16/23    Authorization Type Medicaid [place] Access    Authorization Time Period 02/25/22-06/02/22 for 12 visits    PT Start Time 1448    PT Stop Time 1530    PT Time Calculation (min) 42 min    Equipment Utilized During Treatment Gait belt    Activity Tolerance Patient tolerated treatment well    Behavior During Therapy WFL for tasks assessed/performed                  Past Medical History:  Diagnosis Date   Breakthrough bleeding    Cerebral palsy (HCC)    GAD (generalized anxiety disorder)    Migraine headache    Past Surgical History:  Procedure Laterality Date   LEG SURGERY     bilateral x2 in 2008   Patient Active Problem List   Diagnosis Date Noted   Seasonal allergic rhinitis due to pollen 03/27/2023   Bronchitis 03/13/2023   Gastroesophageal reflux disease without esophagitis 02/24/2023   Migraine without aura and without status migrainosus, not intractable 02/24/2023   GAD (generalized anxiety disorder) 02/24/2023   Loss of weight 07/17/2015   Depression 07/17/2015   Cerebral palsy (HCC) 07/17/2015    ONSET DATE: 1996-06-21 pt has CP  REFERRING DIAG:   G80.9 (ICD-10-CM) - Cerebral palsy (HCC)    THERAPY DIAG:  Difficulty in walking, not elsewhere classified  Unsteadiness on feet  Other lack of coordination  Muscle weakness (generalized)  Rationale for Evaluation and Treatment: Rehabilitation  SUBJECTIVE:                                                                                                                                                                                              SUBJECTIVE STATEMENT    Pt reports continued LLE pain. She describes it as an ache. Pt reports heat, massage, stretching helps make it feel better..  She says some days it's worse and some days it's better. She reports no falls or stumbles.   Pt accompanied by: self , mother  PERTINENT HISTORY:    Pt is a pleasant 27 y/o female with CP who is returning to PT after absence and trip to [place]. She would like  to improve her mobility, gait and balance. Pt has continued her PT exercises while away and worked on gait, and wants to become independent using a RW. She reports 2 recent falls, and no injuries. Pt also with stiffness in BLEs. PMH consists of the following: CP, depression, GAD, migraine headache, hx of LE surgery bilateral x2 in 2008.    PAIN: from eval Are you having pain? No  PRECAUTIONS: Fall  WEIGHT BEARING RESTRICTIONS: No  FALLS: Has patient fallen in last 6 months? Yes. Number of falls 2 when walking. She reports no injury. She got back up independently   PATIENT GOALS: Pt would like to improve her ability to ambulate with RW both inside and over outside surfaces in order to increase her independence with mobility. She would like to learn how to safely get in and out of a car with her RW.  OBJECTIVE:   Today's Treatment: 06/25/23 Gait belt donned and CGA provided unless otherwise noted   TE: Gait with RW x 148 ft to assess for LE pain - able to complete 148 ft with improvement in pain following lap, no significant changes with gait mechanics Standing ext. Stretch with BUE support on // bars 2x30 sec LTL step outs for hip abduction 10x each LE Retro-step for hip ext 10x each LE Walking lunges 4x length of // bars with BUE support   NMR: In // bars: several minutes of the following interventions  Gait without UE support FWD with turns  Gait with fading levels of UE support to no UE support with vertical and then horizontal head turns FWD stepping over half-foam for  obstacle clearance LTL stepping over half-foam for obstacle clearance (more challenging)    PATIENT EDUCATION: Education details: Pt educated throughout session about proper posture and technique with exercises. Improved exercise technique, movement at target joints, use of target muscles after min to mod verbal, visual, tactile cues.   Person educated: Patient Education method: Explanation, Demonstration, and Verbal cues Education comprehension: verbalized understanding and returned demonstration  HOME EXERCISE PROGRAM See pt instruction section   GOALS: Goals reviewed with patient? Yes  SHORT TERM GOALS: Target date: 12/03/2022  Patient will be independent in home exercise program to improve strength/mobility for better functional independence with ADLs. Baseline: to be initiated next 1-2 visits; 02/19/2023: pt not consistently performing HEP; 04/23/23:  Pt reports she goes to gym every three days; 8/7: Pt reports she has been increasing her activity levels, but currently waiting for caregiver to start to be able to go to gym Goal status: IN PROGRESS  LONG TERM GOALS: Target date: 07/16/2023   Patient will increase FOTO score to equal to or greater than  76  to demonstrate statistically significant improvement in mobility and quality of life.  Baseline: risk adjusted 51; 02/19/2023: 63 * pt reporting pain in left leg limiting stair climbing; 04/23/23: 81 Goal status: MET  2.  Pt will reduce 5xsSTS time to < 8 sec to demonstrate clinically significant improvement in LE strength and balance to reduce risk of falls.  Baseline: previously 12.76 sec on 06/10/22, now 10 sec to RW on 10/22/2022; 02/19/2023 8.5 seconds hands-free; 04/23/23: 7 seconds goal status: MET  3.  Pt will improve 6 MWT to > 1000' with no AD and no LOB to demonstrate ability to improve safety with community distances. Baseline: was 822 ft back in August 2023, now 808 with RW, CGA; 02/19/2023 845 ft with 2ZH; 04/23/23: 820 ft 4WW;  05/28/23:  728 ft  with RW, limited by LLE pain Goal status: PARTIALLY MET  4.  Pt will improve 10 m gait speed with LRAD to > 1.0 m/s to demonstrate reduced risk of falls along with safe ability to complete community walking tasks Baseline: previously 0.7 m/s on 06/10/2022, now 0.77 m/s with RW; 02/19/2023: 0.82 m/s with RW; 04/23/23: 0.95 m/s with RW; 05/28/23: 0.83 m/s pain limited LLE Goal status: Partially MET  5.  Patient will increase Berg Balance score by > 6 points to demonstrate decreased fall risk during functional activities.  Baseline: previously 35/56 on 03/11/22(?), test deferred to next appt; 11/13/2022: 36/56; 02/19/2023: 39/56; 04/23/23: 42/56  Goal status: MET  6.  Pt will report improved stool consistency type 4 across 50% of the time and demo less flexed coccyx across 2 weeks in order to promote bowel elimination  Baseline: Pt has Type 1 stool consistency based on Bristol stool scale 100% and her mom has to put boiled water to soften the stool to flush every time.  Coccyx flexed  Goal status: NEW    ASSESSMENT:  CLINICAL IMPRESSION: Pt with excellent motivation to participate in session. Focus was primarily on dynamic balance activities with limited to no UE support in // bars. Pt with improvement in LE pain following these interventions. The pt will benefit from further skilled PT to improve balance, gait and mobility.   OBJECTIVE IMPAIRMENTS: Abnormal gait, decreased activity tolerance, decreased balance, decreased coordination, decreased endurance, decreased knowledge of use of DME, decreased mobility, difficulty walking, decreased strength, impaired tone, improper body mechanics, postural dysfunction, and pain.   ACTIVITY LIMITATIONS: carrying, lifting, bending, standing, squatting, stairs, transfers, dressing, and locomotion level  PARTICIPATION LIMITATIONS: meal prep, cleaning, shopping, community activity, and yard work  PERSONAL FACTORS: Fitness, Sex, Time since onset of  injury/illness/exacerbation, and 3+ comorbidities: PMH consists of the following: CP, depression, GAD, migraine headache, hx of LE surgery bilateral x2 in 2008  are also affecting patient's functional outcome.   REHAB POTENTIAL: Good  CLINICAL DECISION MAKING: Evolving/moderate complexity  EVALUATION COMPLEXITY: Moderate  PLAN:  PT FREQUENCY: 1-2x/week  PT DURATION: 12 weeks  PLANNED INTERVENTIONS: Therapeutic exercises, Therapeutic activity, Neuromuscular re-education, Balance training, Gait training, Patient/Family education, Self Care, Joint mobilization, Stair training, Vestibular training, Canalith repositioning, Visual/preceptual remediation/compensation, Orthotic/Fit training, DME instructions, Dry Needling, Electrical stimulation, Wheelchair mobility training, Spinal mobilization, Cryotherapy, Moist heat, Splintting, Taping, Biofeedback, Manual therapy, and Re-evaluation  PLAN FOR NEXT SESSION: , training with walker/transfer (how to fold walker, get in/out of car), training and uneven surfaces, LE strengthening, gait exercises/step-length/gait-speed, stretching, stairs, [employer] exercises, curbs  Baird Kay, PT 06/25/2023, 5:15 PM  5:15 PM, 06/25/23 Physical Therapist - [facility]

## 2023-07-01 ENCOUNTER — Ambulatory Visit: Payer: MEDICAID | Admitting: Physical Therapy

## 2023-07-02 ENCOUNTER — Ambulatory Visit: Payer: MEDICAID

## 2023-07-08 ENCOUNTER — Ambulatory Visit: Payer: MEDICAID | Admitting: Physical Therapy

## 2023-07-09 ENCOUNTER — Ambulatory Visit: Payer: MEDICAID

## 2023-07-15 ENCOUNTER — Ambulatory Visit: Payer: MEDICAID | Admitting: Physical Therapy

## 2023-07-16 ENCOUNTER — Ambulatory Visit: Payer: MEDICAID

## 2023-07-16 DIAGNOSIS — R278 Other lack of coordination: Secondary | ICD-10-CM

## 2023-07-16 DIAGNOSIS — R262 Difficulty in walking, not elsewhere classified: Secondary | ICD-10-CM | POA: Diagnosis not present

## 2023-07-16 DIAGNOSIS — M6281 Muscle weakness (generalized): Secondary | ICD-10-CM

## 2023-07-16 DIAGNOSIS — R2681 Unsteadiness on feet: Secondary | ICD-10-CM

## 2023-07-16 NOTE — Therapy (Signed)
OUTPATIENT PHYSICAL THERAPY NEURO TREATMENT NOTE    Patient Name: Aniyjah Walton MRN: [id number] DOB:[birth year], 27 y.o., female Today's Date: 07/16/2023   PCP: [name], MD REFERRING PROVIDER:   [name], MD    END OF SESSION:  PT End of Session - 07/16/23 1639     Visit Number 24    Number of Visits 40    Date for PT Re-Evaluation 07/16/23    Authorization Type Medicaid [place] Access    Authorization Time Period 02/25/22-06/02/22 for 12 visits    PT Start Time 1448    PT Stop Time 1529    PT Time Calculation (min) 41 min    Equipment Utilized During Treatment Gait belt    Activity Tolerance Patient tolerated treatment well    Behavior During Therapy WFL for tasks assessed/performed                   Past Medical History:  Diagnosis Date   Breakthrough bleeding    Cerebral palsy (HCC)    GAD (generalized anxiety disorder)    Migraine headache    Past Surgical History:  Procedure Laterality Date   LEG SURGERY     bilateral x2 in 2008   Patient Active Problem List   Diagnosis Date Noted   Seasonal allergic rhinitis due to pollen 03/27/2023   Bronchitis 03/13/2023   Gastroesophageal reflux disease without esophagitis 02/24/2023   Migraine without aura and without status migrainosus, not intractable 02/24/2023   GAD (generalized anxiety disorder) 02/24/2023   Loss of weight 07/17/2015   Depression 07/17/2015   Cerebral palsy (HCC) 07/17/2015    ONSET DATE: 05/08/1996 pt has CP  REFERRING DIAG:   G80.9 (ICD-10-CM) - Cerebral palsy (HCC)    THERAPY DIAG:  Muscle weakness (generalized)  Unsteadiness on feet  Difficulty in walking, not elsewhere classified  Other lack of coordination  Rationale for Evaluation and Treatment: Rehabilitation  SUBJECTIVE:                                                                                                                                                                                              SUBJECTIVE STATEMENT    Pt had a good birthday, has been doing well. She reports no pain in BLE, just slight abdominal pain (improved since pelvic PT). Pt getting botox next month. She reports no falls. She has not been using walker as much in her home and doing OK.  Pt accompanied by: self , mother  PERTINENT HISTORY:    Pt is a pleasant 27 y/o female with CP who is returning to PT after absence  and trip to [place]. She would like to improve her mobility, gait and balance. Pt has continued her PT exercises while away and worked on gait, and wants to become independent using a RW. She reports 2 recent falls, and no injuries. Pt also with stiffness in BLEs. PMH consists of the following: CP, depression, GAD, migraine headache, hx of LE surgery bilateral x2 in 2008.    PAIN: from eval Are you having pain? No  PRECAUTIONS: Fall  WEIGHT BEARING RESTRICTIONS: No  FALLS: Has patient fallen in last 6 months? Yes. Number of falls 2 when walking. She reports no injury. She got back up independently   PATIENT GOALS: Pt would like to improve her ability to ambulate with RW both inside and over outside surfaces in order to increase her independence with mobility. She would like to learn how to safely get in and out of a car with her RW.  OBJECTIVE:   Today's Treatment: 07/16/23 Gait belt donned and CGA provided unless otherwise noted  TE: Standing hip ext 2x10 each LE with TC to improve technique Mini squats 2x10 - continued use of VC/TC to improve technique and limit adduction  Lunges 2x5 with UUE support.    NMR: Near support surface- several minutes of the following  FWD gait with turns (each way) without AD - pt requires use of intermittent UE support on bar/wall Gait with vertical head turns - intermittent UE support Gait with horizontal head turns - Intermittent UE support Comments: pt reports dual task of ambulating and turning head as same time is very difficult Static balance  WBOS eyes open Static balance WBOS eyes closed  Static balance WBOS vertical head turns - reports movement makes pt slightly dizzy (reports she just had migraine yesterday, gets these symptoms sometimes, will continue to monitor)  Static balance WBOS horizontal head turns   PATIENT EDUCATION: Education details: Pt educated throughout session about proper posture and technique with exercises. Improved exercise technique, movement at target joints, use of target muscles after min to mod verbal, visual, tactile cues.   Person educated: Patient Education method: Explanation, Demonstration, and Verbal cues Education comprehension: verbalized understanding and returned demonstration  HOME EXERCISE PROGRAM See pt instruction section   GOALS: Goals reviewed with patient? Yes  SHORT TERM GOALS: Target date: 12/03/2022  Patient will be independent in home exercise program to improve strength/mobility for better functional independence with ADLs. Baseline: to be initiated next 1-2 visits; 02/19/2023: pt not consistently performing HEP; 04/23/23:  Pt reports she goes to gym every three days; 8/7: Pt reports she has been increasing her activity levels, but currently waiting for caregiver to start to be able to go to gym Goal status: IN PROGRESS  LONG TERM GOALS: Target date: 07/16/2023   Patient will increase FOTO score to equal to or greater than  76  to demonstrate statistically significant improvement in mobility and quality of life.  Baseline: risk adjusted 51; 02/19/2023: 63 * pt reporting pain in left leg limiting stair climbing; 04/23/23: 81 Goal status: MET  2.  Pt will reduce 5xsSTS time to < 8 sec to demonstrate clinically significant improvement in LE strength and balance to reduce risk of falls.  Baseline: previously 12.76 sec on 06/10/22, now 10 sec to RW on 10/22/2022; 02/19/2023 8.5 seconds hands-free; 04/23/23: 7 seconds goal status: MET  3.  Pt will improve 6 MWT to > 1000' with no AD and no  LOB to demonstrate ability to improve safety with community distances. Baseline:  was 822 ft back in August 2023, now 808 with RW, CGA; 02/19/2023 845 ft with 4WW; 04/23/23: 820 ft 4WW; 05/28/23:  728 ft with RW, limited by LLE pain Goal status: PARTIALLY MET  4.  Pt will improve 10 m gait speed with LRAD to > 1.0 m/s to demonstrate reduced risk of falls along with safe ability to complete community walking tasks Baseline: previously 0.7 m/s on 06/10/2022, now 0.77 m/s with RW; 02/19/2023: 0.82 m/s with RW; 04/23/23: 0.95 m/s with RW; 05/28/23: 0.83 m/s pain limited LLE Goal status: Partially MET  5.  Patient will increase Berg Balance score by > 6 points to demonstrate decreased fall risk during functional activities.  Baseline: previously 35/56 on 03/11/22(?), test deferred to next appt; 11/13/2022: 36/56; 02/19/2023: 39/56; 04/23/23: 42/56  Goal status: MET  6.  Pt will report improved stool consistency type 4 across 50% of the time and demo less flexed coccyx across 2 weeks in order to promote bowel elimination  Baseline: Pt has Type 1 stool consistency based on Bristol stool scale 100% and her mom has to put boiled water to soften the stool to flush every time.  Coccyx flexed  Goal status: NEW    ASSESSMENT:  CLINICAL IMPRESSION: Continued focus on improving dynamic balance and LE strengthening/emphasis on hip extension movements. Pt generally requires intermittent UE support with dynamic activities, and has difficulty ambulating and turning hear head without stopping gait. Pt did report dizziness when performing vertical head turns, reports this is not new, will continue to monitor. The pt will benefit from further skilled PT to improve balance, gait and mobility.   OBJECTIVE IMPAIRMENTS: Abnormal gait, decreased activity tolerance, decreased balance, decreased coordination, decreased endurance, decreased knowledge of use of DME, decreased mobility, difficulty walking, decreased strength, impaired tone,  improper body mechanics, postural dysfunction, and pain.   ACTIVITY LIMITATIONS: carrying, lifting, bending, standing, squatting, stairs, transfers, dressing, and locomotion level  PARTICIPATION LIMITATIONS: meal prep, cleaning, shopping, community activity, and yard work  PERSONAL FACTORS: Fitness, Sex, Time since onset of injury/illness/exacerbation, and 3+ comorbidities: PMH consists of the following: CP, depression, GAD, migraine headache, hx of LE surgery bilateral x2 in 2008  are also affecting patient's functional outcome.   REHAB POTENTIAL: Good  CLINICAL DECISION MAKING: Evolving/moderate complexity  EVALUATION COMPLEXITY: Moderate  PLAN:  PT FREQUENCY: 1-2x/week  PT DURATION: 12 weeks  PLANNED INTERVENTIONS: Therapeutic exercises, Therapeutic activity, Neuromuscular re-education, Balance training, Gait training, Patient/Family education, Self Care, Joint mobilization, Stair training, Vestibular training, Canalith repositioning, Visual/preceptual remediation/compensation, Orthotic/Fit training, DME instructions, Dry Needling, Electrical stimulation, Wheelchair mobility training, Spinal mobilization, Cryotherapy, Moist heat, Splintting, Taping, Biofeedback, Manual therapy, and Re-evaluation  PLAN FOR NEXT SESSION: , training with walker/transfer (how to fold walker, get in/out of car), training and uneven surfaces, LE strengthening, gait exercises/step-length/gait-speed, stretching, stairs, [employer] exercises, curbs  Baird Kay, PT 07/16/2023, 4:46 PM  4:46 PM, 07/16/23 Physical Therapist - [facility]

## 2023-07-22 ENCOUNTER — Ambulatory Visit: Payer: MEDICAID | Admitting: Physical Therapy

## 2023-07-23 ENCOUNTER — Ambulatory Visit: Payer: MEDICAID

## 2023-07-29 ENCOUNTER — Ambulatory Visit: Payer: MEDICAID | Admitting: Internal Medicine

## 2023-07-30 ENCOUNTER — Ambulatory Visit: Payer: MEDICAID | Attending: Internal Medicine

## 2023-07-30 DIAGNOSIS — R262 Difficulty in walking, not elsewhere classified: Secondary | ICD-10-CM | POA: Insufficient documentation

## 2023-07-30 DIAGNOSIS — R278 Other lack of coordination: Secondary | ICD-10-CM | POA: Insufficient documentation

## 2023-07-30 DIAGNOSIS — M25662 Stiffness of left knee, not elsewhere classified: Secondary | ICD-10-CM | POA: Diagnosis present

## 2023-07-30 DIAGNOSIS — M25661 Stiffness of right knee, not elsewhere classified: Secondary | ICD-10-CM | POA: Diagnosis present

## 2023-07-30 DIAGNOSIS — M6281 Muscle weakness (generalized): Secondary | ICD-10-CM | POA: Diagnosis present

## 2023-07-30 DIAGNOSIS — R2681 Unsteadiness on feet: Secondary | ICD-10-CM | POA: Diagnosis present

## 2023-07-30 NOTE — Therapy (Signed)
OUTPATIENT PHYSICAL THERAPY NEURO TREATMENT NOTE/RECERT    Patient Name: Renee Walton MRN: [id number] DOB:[birth year], 27 y.o., female Today's Date: 07/30/2023   PCP: [name], MD REFERRING PROVIDER:   [name], MD    END OF SESSION:  PT End of Session - 07/30/23 1453     Visit Number 25    Number of Visits 49    Date for PT Re-Evaluation 10/22/23    Authorization Type Medicaid [place] Access    Authorization Time Period 02/25/22-06/02/22 for 12 visits    PT Start Time 1450    PT Stop Time 1530    PT Time Calculation (min) 40 min    Equipment Utilized During Treatment Gait belt    Activity Tolerance Patient tolerated treatment well    Behavior During Therapy WFL for tasks assessed/performed                   Past Medical History:  Diagnosis Date   Breakthrough bleeding    Cerebral palsy (HCC)    GAD (generalized anxiety disorder)    Migraine headache    Past Surgical History:  Procedure Laterality Date   LEG SURGERY     bilateral x2 in 2008   Patient Active Problem List   Diagnosis Date Noted   Seasonal allergic rhinitis due to pollen 03/27/2023   Bronchitis 03/13/2023   Gastroesophageal reflux disease without esophagitis 02/24/2023   Migraine without aura and without status migrainosus, not intractable 02/24/2023   GAD (generalized anxiety disorder) 02/24/2023   Loss of weight 07/17/2015   Depression 07/17/2015   Cerebral palsy (HCC) 07/17/2015    ONSET DATE: July 16, 1996 pt has CP  REFERRING DIAG:   G80.9 (ICD-10-CM) - Cerebral palsy (HCC)    THERAPY DIAG:  Joint stiffness of right lower leg  Joint stiffness of left lower leg  Difficulty in walking, not elsewhere classified  Other lack of coordination  Muscle weakness (generalized)  Rationale for Evaluation and Treatment: Rehabilitation  SUBJECTIVE:                                                                                                                                                                                              SUBJECTIVE STATEMENT    Pt got botox on the 4th. Thinking about surgery. Pt reports pain is OK at the moment.  Pt accompanied by: self , mother  PERTINENT HISTORY:    Pt is a pleasant 27 y/o female with CP who is returning to PT after absence and trip to [place]. She would like to improve her mobility, gait and balance. Pt has continued her PT  exercises while away and worked on gait, and wants to become independent using a RW. She reports 2 recent falls, and no injuries. Pt also with stiffness in BLEs. PMH consists of the following: CP, depression, GAD, migraine headache, hx of LE surgery bilateral x2 in 2008.    PAIN: from eval Are you having pain? No  PRECAUTIONS: Fall  WEIGHT BEARING RESTRICTIONS: No  FALLS: Has patient fallen in last 6 months? Yes. Number of falls 2 when walking. She reports no injury. She got back up independently   PATIENT GOALS: Pt would like to improve her ability to ambulate with RW both inside and over outside surfaces in order to increase her independence with mobility. She would like to learn how to safely get in and out of a car with her RW.  OBJECTIVE:   Today's Treatment: 07/30/23 Gait belt donned and CGA provided for upright activities unless otherwise noted   TA: Goal reassessment completed for progress visit.   Manual: Long duration stretching (several minutes) of the following: Bilateral adductors  Bilateral hamstrings Bilateral gastrocsol complex  -Manual LTRs 4x each way -Attempt of piriformis stretching but discontinued as pt with full range and without sense of tightness or pain   TE: Manually resisted DF/PF 10x for each, completed each LE  PATIENT EDUCATION: Education details: Pt educated throughout session about proper posture and technique with exercises. Improved exercise technique, movement at target joints, use of target muscles after min to mod verbal, visual,  tactile cues.   Person educated: Patient Education method: Explanation, Demonstration, and Verbal cues Education comprehension: verbalized understanding and returned demonstration  HOME EXERCISE PROGRAM See pt instruction section   GOALS: Goals reviewed with patient? Yes  SHORT TERM GOALS: Target date: 12/03/2022  Patient will be independent in home exercise program to improve strength/mobility for better functional independence with ADLs. Baseline: to be initiated next 1-2 visits; 02/19/2023: pt not consistently performing HEP; 04/23/23:  Pt reports she goes to gym every three days; 8/7: Pt reports she has been increasing her activity levels, but currently waiting for caregiver to start to be able to go to gym Goal status: IN PROGRESS  LONG TERM GOALS: Target date: 07/16/2023   Patient will increase FOTO score to equal to or greater than  76  to demonstrate statistically significant improvement in mobility and quality of life.  Baseline: risk adjusted 51; 02/19/2023: 63 * pt reporting pain in left leg limiting stair climbing; 04/23/23: 81 Goal status: MET  2.  Pt will reduce 5xsSTS time to < 8 sec to demonstrate clinically significant improvement in LE strength and balance to reduce risk of falls.  Baseline: previously 12.76 sec on 06/10/22, now 10 sec to RW on 10/22/2022; 02/19/2023 8.5 seconds hands-free; 04/23/23: 7 seconds goal status: MET  3.  Pt will improve 6 MWT to > 1000' with no AD and no LOB to demonstrate ability to improve safety with community distances. Baseline: was 822 ft back in August 2023, now 808 with RW, CGA; 02/19/2023 845 ft with 1BJ; 04/23/23: 820 ft 4WW; 05/28/23:  728 ft with RW, limited by LLE pain; 07/30/2023: 878 ft with RW Goal status: PARTIALLY MET  4.  Pt will improve 10 m gait speed with LRAD to > 1.0 m/s to demonstrate reduced risk of falls along with safe ability to complete community walking tasks Baseline: previously 0.7 m/s on 06/10/2022, now 0.77 m/s with RW;  02/19/2023: 0.82 m/s with RW; 04/23/23: 0.95 m/s with RW; 05/28/23: 0.83 m/s pain  limited LLE; 10/9: 1.08 m/s with RW  Goal status: MET  5.  Patient will increase Berg Balance score by > 6 points to demonstrate decreased fall risk during functional activities.  Baseline: previously 35/56 on 03/11/22(?), test deferred to next appt; 11/13/2022: 36/56; 02/19/2023: 39/56; 04/23/23: 42/56  Goal status: MET  6.  Pt will report improved stool consistency type 4 across 50% of the time and demo less flexed coccyx across 2 weeks in order to promote bowel elimination  Baseline: Pt has Type 1 stool consistency based on Bristol stool scale 100% and her mom has to put boiled water to soften the stool to flush every time.  Coccyx flexed  Goal status: NEW    ASSESSMENT:  CLINICAL IMPRESSION: Goal reassessment completed for recertification, where PT assessed all but pevlic goals (deferred to pelvic therapist). Pt making gains AEB achieving goal and improving performance. These gains indicate increased gait speed and functional capacity/gait ability. Patient's condition has the potential to improve in response to therapy. Maximum improvement is yet to be obtained. The anticipated improvement is attainable and reasonable in a generally predictable time. The pt will benefit from further skilled PT to improve balance, gait and mobility.   OBJECTIVE IMPAIRMENTS: Abnormal gait, decreased activity tolerance, decreased balance, decreased coordination, decreased endurance, decreased knowledge of use of DME, decreased mobility, difficulty walking, decreased strength, impaired tone, improper body mechanics, postural dysfunction, and pain.   ACTIVITY LIMITATIONS: carrying, lifting, bending, standing, squatting, stairs, transfers, dressing, and locomotion level  PARTICIPATION LIMITATIONS: meal prep, cleaning, shopping, community activity, and yard work  PERSONAL FACTORS: Fitness, Sex, Time since onset of  injury/illness/exacerbation, and 3+ comorbidities: PMH consists of the following: CP, depression, GAD, migraine headache, hx of LE surgery bilateral x2 in 2008  are also affecting patient's functional outcome.   REHAB POTENTIAL: Good  CLINICAL DECISION MAKING: Evolving/moderate complexity  EVALUATION COMPLEXITY: Moderate  PLAN:  PT FREQUENCY: 1-2x/week  PT DURATION: 12 weeks  PLANNED INTERVENTIONS: Therapeutic exercises, Therapeutic activity, Neuromuscular re-education, Balance training, Gait training, Patient/Family education, Self Care, Joint mobilization, Stair training, Vestibular training, Canalith repositioning, Visual/preceptual remediation/compensation, Orthotic/Fit training, DME instructions, Dry Needling, Electrical stimulation, Wheelchair mobility training, Spinal mobilization, Cryotherapy, Moist heat, Splintting, Taping, Biofeedback, Manual therapy, and Re-evaluation  PLAN FOR NEXT SESSION: , training with walker/transfer (how to fold walker, get in/out of car), training and uneven surfaces, LE strengthening, gait exercises/step-length/gait-speed, stretching, stairs, [employer] exercises, curbs  Baird Kay, PT 07/30/2023, 3:44 PM  3:44 PM, 07/30/23 Physical Therapist - [facility]

## 2023-08-05 ENCOUNTER — Other Ambulatory Visit: Payer: MEDICAID

## 2023-08-05 ENCOUNTER — Ambulatory Visit: Payer: MEDICAID | Admitting: Internal Medicine

## 2023-08-05 ENCOUNTER — Encounter: Payer: Self-pay | Admitting: Internal Medicine

## 2023-08-05 VITALS — BP 118/84 | HR 65 | Ht 62.0 in | Wt 88.0 lb

## 2023-08-05 DIAGNOSIS — K219 Gastro-esophageal reflux disease without esophagitis: Secondary | ICD-10-CM | POA: Diagnosis not present

## 2023-08-05 DIAGNOSIS — J301 Allergic rhinitis due to pollen: Secondary | ICD-10-CM | POA: Diagnosis not present

## 2023-08-05 DIAGNOSIS — E559 Vitamin D deficiency, unspecified: Secondary | ICD-10-CM

## 2023-08-05 DIAGNOSIS — G801 Spastic diplegic cerebral palsy: Secondary | ICD-10-CM

## 2023-08-05 DIAGNOSIS — Z23 Encounter for immunization: Secondary | ICD-10-CM | POA: Diagnosis not present

## 2023-08-05 NOTE — Progress Notes (Signed)
Established Patient Office Visit  Subjective:  Patient ID: Renee Walton, female    DOB: 1996/08/04  Age: 27 y.o. MRN: [id number]  Chief Complaint  Patient presents with   Follow-up    4 month follow up    Patient is here for her follow-up, accompanied by her mother.  She is generally feeling well.  Recently received Botox injections for her spasticity due to cerebral palsy , mentions she had some pain due to back.  Taking all her medications as prescribed. Will get labs today as well as a flu vaccine    No other concerns at this time.   Past Medical History:  Diagnosis Date   Breakthrough bleeding    Cerebral palsy (HCC)    GAD (generalized anxiety disorder)    Migraine headache     Past Surgical History:  Procedure Laterality Date   LEG SURGERY     bilateral x2 in 2008    Social History   Socioeconomic History   Marital status: Single    Spouse name: Not on file   Number of children: Not on file   Years of education: Not on file   Highest education level: Not on file  Occupational History   Not on file  Tobacco Use   Smoking status: Never   Smokeless tobacco: Never  Vaping Use   Vaping status: Never Used  Substance and Sexual Activity   Alcohol use: No    Alcohol/week: 0.0 standard drinks of alcohol   Drug use: No   Sexual activity: Never  Other Topics Concern   Not on file  Social History Narrative   ** Merged History Encounter **       Social Determinants of Health   Financial Resource Strain: Not on file  Food Insecurity: Not on file  Transportation Needs: Not on file  Physical Activity: Not on file  Stress: Not on file  Social Connections: Not on file  Intimate Partner Violence: Not on file    Family History  Problem Relation Age of Onset   Stroke Other        grandparent   Hypertension Other        parent   Mental illness Other        parent   Diabetes Other        parent, grandparent    Allergies  Allergen Reactions   Cat  Hair Extract    Cat Hair Extract Itching    Review of Systems  Constitutional: Negative.  Negative for chills, fever, malaise/fatigue and weight loss.  HENT: Negative.  Negative for congestion, ear discharge and ear pain.   Eyes: Negative.   Respiratory: Negative.  Negative for cough and shortness of breath.   Cardiovascular: Negative.  Negative for chest pain, palpitations and leg swelling.  Gastrointestinal: Negative.  Negative for abdominal pain, constipation, diarrhea, heartburn, nausea and vomiting.  Genitourinary: Negative.  Negative for dysuria and flank pain.  Musculoskeletal:  Positive for joint pain and myalgias.  Skin: Negative.   Neurological:  Positive for dizziness, focal weakness and weakness. Negative for headaches.  Endo/Heme/Allergies: Negative.   Psychiatric/Behavioral: Negative.  Negative for depression and suicidal ideas. The patient is not nervous/anxious.        Objective:   BP 118/84   Pulse 65   Ht 5\' 2"  (1.575 m)   Wt 88 lb (39.9 kg)   SpO2 99%   BMI 16.10 kg/m   Vitals:   08/05/23 1322  BP: 118/84  Pulse: 65  Height: 5\' 2"  (1.575 m)  Weight: 88 lb (39.9 kg)  SpO2: 99%  BMI (Calculated): 16.09    Physical Exam Vitals and nursing note reviewed.  Constitutional:      Appearance: Normal appearance.  HENT:     Head: Normocephalic and atraumatic.     Nose: Nose normal.     Mouth/Throat:     Mouth: Mucous membranes are moist.     Pharynx: Oropharynx is clear.  Eyes:     Conjunctiva/sclera: Conjunctivae normal.     Pupils: Pupils are equal, round, and reactive to light.  Cardiovascular:     Rate and Rhythm: Normal rate and regular rhythm.     Pulses: Normal pulses.     Heart sounds: Normal heart sounds. No murmur heard. Pulmonary:     Effort: Pulmonary effort is normal.     Breath sounds: Normal breath sounds. No wheezing.  Abdominal:     General: Bowel sounds are normal.     Palpations: Abdomen is soft.     Tenderness: There is no  abdominal tenderness. There is no right CVA tenderness or left CVA tenderness.  Musculoskeletal:        General: Normal range of motion.     Cervical back: Normal range of motion.     Right lower leg: No edema.     Left lower leg: No edema.  Skin:    General: Skin is warm and dry.  Neurological:     General: No focal deficit present.     Mental Status: She is alert and oriented to person, place, and time.  Psychiatric:        Mood and Affect: Mood normal.        Behavior: Behavior normal.      No results found for any visits on 08/05/23.  No results found for this or any previous visit (from the past 2160 hour(s)).    Assessment & Plan:  Patient advised to continue all her medications.  Check labs today. Flu vaccine. Problem List Items Addressed This Visit     Cerebral palsy (HCC) - Primary   Gastroesophageal reflux disease without esophagitis   Relevant Orders   CBC with Diff   CMP14+EGFR   Seasonal allergic rhinitis due to pollen   Relevant Orders   CBC with Diff   Other Visit Diagnoses     Vitamin D deficiency       Relevant Orders   Vitamin D (25 hydroxy)   Need for immunization against influenza       Relevant Orders   Influenza, MDCK, trivalent, PF(Flucelvax egg-free) (Completed)       Follow up 4 months.   Total time spent: 30 minutes  [name], MD  08/05/2023   This document may have been prepared by [facility] Voice Recognition software and as such may include unintentional dictation errors.

## 2023-08-06 ENCOUNTER — Encounter: Payer: Self-pay | Admitting: Internal Medicine

## 2023-08-06 ENCOUNTER — Ambulatory Visit: Payer: MEDICAID

## 2023-08-06 LAB — CMP14+EGFR
ALT: 10 [IU]/L (ref 0–32)
AST: 15 [IU]/L (ref 0–40)
Albumin: 4.6 g/dL (ref 4.0–5.0)
Alkaline Phosphatase: 53 [IU]/L (ref 44–121)
BUN/Creatinine Ratio: 40 — ABNORMAL HIGH (ref 9–23)
BUN: 27 mg/dL — ABNORMAL HIGH (ref 6–20)
Bilirubin Total: 0.3 mg/dL (ref 0.0–1.2)
CO2: 22 mmol/L (ref 20–29)
Calcium: 9.2 mg/dL (ref 8.7–10.2)
Chloride: 100 mmol/L (ref 96–106)
Creatinine, Ser: 0.68 mg/dL (ref 0.57–1.00)
Globulin, Total: 2.9 g/dL (ref 1.5–4.5)
Glucose: 75 mg/dL (ref 70–99)
Potassium: 3.8 mmol/L (ref 3.5–5.2)
Sodium: 137 mmol/L (ref 134–144)
Total Protein: 7.5 g/dL (ref 6.0–8.5)
eGFR: 121 mL/min/{1.73_m2} (ref >59)

## 2023-08-06 LAB — CBC WITH DIFFERENTIAL/PLATELET
Basophils Absolute: 0 10*3/uL (ref 0.0–0.2)
Basos: 0 %
EOS (ABSOLUTE): 0.1 10*3/uL (ref 0.0–0.4)
Eos: 1 %
Hematocrit: 42 % (ref 34.0–46.6)
Hemoglobin: 13.7 g/dL (ref 11.1–15.9)
Immature Grans (Abs): 0 10*3/uL (ref 0.0–0.1)
Immature Granulocytes: 0 %
Lymphocytes Absolute: 2.3 10*3/uL (ref 0.7–3.1)
Lymphs: 19 %
MCH: 28.6 pg (ref 26.6–33.0)
MCHC: 32.6 g/dL (ref 31.5–35.7)
MCV: 88 fL (ref 79–97)
Monocytes Absolute: 0.5 10*3/uL (ref 0.1–0.9)
Monocytes: 4 %
Neutrophils Absolute: 9.4 10*3/uL — ABNORMAL HIGH (ref 1.4–7.0)
Neutrophils: 76 %
Platelets: 220 10*3/uL (ref 150–450)
RBC: 4.79 x10E6/uL (ref 3.77–5.28)
RDW: 12.4 % (ref 11.7–15.4)
WBC: 12.4 10*3/uL — ABNORMAL HIGH (ref 3.4–10.8)

## 2023-08-06 LAB — VITAMIN D 25 HYDROXY (VIT D DEFICIENCY, FRACTURES): Vit D, 25-Hydroxy: 85.2 ng/mL (ref 30.0–100.0)

## 2023-08-07 NOTE — Progress Notes (Signed)
Patient notified

## 2023-08-13 ENCOUNTER — Ambulatory Visit: Payer: MEDICAID

## 2023-08-13 ENCOUNTER — Ambulatory Visit (INDEPENDENT_AMBULATORY_CARE_PROVIDER_SITE_OTHER): Payer: MEDICAID | Admitting: Family

## 2023-08-13 DIAGNOSIS — R350 Frequency of micturition: Secondary | ICD-10-CM | POA: Diagnosis not present

## 2023-08-14 ENCOUNTER — Other Ambulatory Visit: Payer: Self-pay | Admitting: Cardiology

## 2023-08-14 DIAGNOSIS — R8290 Unspecified abnormal findings in urine: Secondary | ICD-10-CM

## 2023-08-14 LAB — POCT URINALYSIS DIPSTICK
Bilirubin, UA: NEGATIVE
Blood, UA: NEGATIVE
Glucose, UA: NEGATIVE
Ketones, UA: NEGATIVE
Leukocytes, UA: NEGATIVE
Nitrite, UA: NEGATIVE
Protein, UA: NEGATIVE
Spec Grav, UA: >=1.03 — AB (ref 1.010–1.025)
Urobilinogen, UA: 0.2 U/dL
pH, UA: 6 (ref 5.0–8.0)

## 2023-08-16 LAB — URINE CULTURE

## 2023-08-18 ENCOUNTER — Encounter: Payer: Self-pay | Admitting: Emergency Medicine

## 2023-08-18 ENCOUNTER — Ambulatory Visit
Admission: EM | Admit: 2023-08-18 | Discharge: 2023-08-18 | Disposition: A | Discharge status: Home / Self Care | Payer: MEDICAID | Attending: Emergency Medicine | Admitting: Emergency Medicine

## 2023-08-18 DIAGNOSIS — B353 Tinea pedis: Secondary | ICD-10-CM

## 2023-08-18 MED ORDER — CLOTRIMAZOLE-BETAMETHASONE 1-0.05 % EX CREA: TOPICAL_CREAM | CUTANEOUS | 1 refills | Status: AC

## 2023-08-18 NOTE — ED Triage Notes (Signed)
Pt presents with tiny blisters on bilateral feet x 2 days. Pt has tried OTC athletes feet cream to get rid of them.

## 2023-08-18 NOTE — Discharge Instructions (Addendum)
Apply the Lotrisone to the rash on both feet and between the toes twice daily until the rash has resolved and then for 3 additional days to ensure that it has resolved completely.  Keep your feet open to air is much as possible and keep gauze or toe separators between your toes to allow airflow to help resolve the fungal infection.  Dry your feet thoroughly following a shower using a hair dryer.  This includes the webspaces between your toes.  Use Goldbond medicated powder, or Lumi whole-body deodorant, to help cut down on moisture.  You may also want to discuss with dermatology about Botox treatment to help cut down on excessive perspiration.  If you develop any increased redness, pain, red streaks, or fever please return for reevaluation or see your primary care provider.

## 2023-08-18 NOTE — ED Provider Notes (Signed)
[facility]    CSN: [id number] Arrival date & time: 08/18/23  1820      History   Chief Complaint Chief Complaint  Patient presents with   Blister    HPI Renee Walton is a 27 y.o. female.   HPI  27 year old female with a past medical history significant for CP, GERD, migraines, and GAD presents for evaluation of blisters on both feet that been present for last 2 days.  She has been using over-the-counter athlete's foot cream without any improvement of symptoms.  Patient does have a history of excessive sweating on her feet and she reports that the blisters are itchy.  They are between her toes and on the sides of both feet.  Past Medical History:  Diagnosis Date   Breakthrough bleeding    Cerebral palsy (HCC)    GAD (generalized anxiety disorder)    Migraine headache     Patient Active Problem List   Diagnosis Date Noted   Seasonal allergic rhinitis due to pollen 03/27/2023   Bronchitis 03/13/2023   Gastroesophageal reflux disease without esophagitis 02/24/2023   Migraine without aura and without status migrainosus, not intractable 02/24/2023   GAD (generalized anxiety disorder) 02/24/2023   Loss of weight 07/17/2015   Depression 07/17/2015   Cerebral palsy (HCC) 07/17/2015    Past Surgical History:  Procedure Laterality Date   LEG SURGERY     bilateral x2 in 2008    OB History   No obstetric history on file.      Home Medications    Prior to Admission medications   Medication Sig Start Date End Date Taking? Authorizing Provider  baclofen (LIORESAL) 10 MG tablet Take 10 mg by mouth 3 (three) times daily.   Yes [provider]  clotrimazole-betamethasone (LOTRISONE) cream Apply to affected area 2 times daily until the rash has resolved and then for 3 additional days. 08/18/23  Yes Becky Augusta, NP  escitalopram (LEXAPRO) 5 MG tablet Take 5 mg by mouth daily.   Yes [provider]  gabapentin (NEURONTIN) 100 MG capsule Take  100 mg by mouth at bedtime.   Yes [provider]  meloxicam (MOBIC) 15 MG tablet TAKE 1 TABLET BY MOUTH DAILY AS NEEDED 06/06/23  Yes [name], MD  Multiple Vitamins-Minerals (CENTRUM SILVER ULTRA WOMENS PO) Take by mouth.   Yes [provider]  Norethindrone-Ethinyl Estradiol-Fe Biphas (LO LOESTRIN FE) 1 MG-10 MCG / 10 MCG tablet Take 1 tablet by mouth daily. 01/21/19  Yes Copland, Ilona Sorrel, PA-C  omeprazole (PRILOSEC) 40 MG capsule Take 40 mg by mouth daily. 07/17/21  Yes [provider]  senna-docusate (SENOKOT-S) 8.6-50 MG tablet Take by mouth. 03/31/18  Yes [provider]  SUMAtriptan (IMITREX) 50 MG tablet Take by mouth as directed. 07/31/21  Yes [provider]  Vitamin D, Ergocalciferol, (DRISDOL) 50000 units CAPS capsule Take 50,000 Units by mouth every 7 (seven) days.   Yes [provider]  ondansetron (ZOFRAN) 4 MG tablet TAKE ONE TABLET BY MOUTH EVERY 4 TO 6 HOURS AS NEEDED 04/01/23   [name], MD    Family History Family History  Problem Relation Age of Onset   Stroke Other        grandparent   Hypertension Other        parent   Mental illness Other        parent   Diabetes Other        parent, grandparent    Social  History Social History   Tobacco Use   Smoking status: Never   Smokeless tobacco: Never  Vaping Use   Vaping status: Never Used  Substance Use Topics   Alcohol use: No    Alcohol/week: 0.0 standard drinks of alcohol   Drug use: No     Allergies   Cat hair extract and Cat hair extract   Review of Systems Review of Systems  Constitutional:  Negative for fever.  Skin:  Positive for color change and rash.     Physical Exam Triage Vital Signs ED Triage Vitals  Encounter Vitals Group     BP      Systolic BP Percentile      Diastolic BP Percentile      Pulse      Resp      Temp      Temp src      SpO2      Weight      Height      Head Circumference      Peak Flow      Pain  Score      Pain Loc      Pain Education      Exclude from Growth Chart    No data found.  Updated Vital Signs BP 133/86 (BP Location: Left Arm)   Pulse 96   Temp 98.2 F (36.8 C) (Oral)   Resp 18   SpO2 98%   Visual Acuity Right Eye Distance:   Left Eye Distance:   Bilateral Distance:    Right Eye Near:   Left Eye Near:    Bilateral Near:     Physical Exam Vitals and nursing note reviewed.  Constitutional:      Appearance: Normal appearance. She is not ill-appearing.  HENT:     Head: Normocephalic and atraumatic.  Skin:    Capillary Refill: Capillary refill takes less than 2 seconds.     Findings: Erythema, lesion and rash present.  Neurological:     General: No focal deficit present.     Mental Status: She is alert and oriented to person, place, and time.      UC Treatments / Results  Labs (all labs ordered are listed, but only abnormal results are displayed) Labs Reviewed - No data to display  EKG   Radiology No results found.  Procedures Procedures (including critical care time)  Medications Ordered in UC Medications - No data to display  Initial Impression / Assessment and Plan / UC Course  I have reviewed the triage vital signs and the nursing notes.  Pertinent labs & imaging results that were available during my care of the patient were reviewed by me and considered in my medical decision making (see chart for details).  Patient is a pleasant, nontoxic-appearing 27 year old female presenting for evaluation of athlete's foot along both sides of her feet and in the webspaces of all of her toes that have been present for the last 2 days.  She has been using over-the-counter athlete's foot cream without any improvement of symptoms.  She does have a history of excessive sweating and is wondering if there is anything that can be done about that.  She denies any drainage, peeling of the skin, or pain.          As you can see in images above, the  patient exam is consistent with athlete's foot.  I will discharge her home on Lotrisone cream to apply twice daily until the rash is resolved.  We discussed keeping her feet open to air is much as possible and using Goldbond medicated powder to help dry out moisture when she has to wear shoes.  Return precautions reviewed.   Final Clinical Impressions(s) / UC Diagnoses   Final diagnoses:  Tinea pedis of both feet     Discharge Instructions      Apply the Lotrisone to the rash on both feet and between the toes twice daily until the rash has resolved and then for 3 additional days to ensure that it has resolved completely.  Keep your feet open to air is much as possible and keep gauze or toe separators between your toes to allow airflow to help resolve the fungal infection.  Dry your feet thoroughly following a shower using a hair dryer.  This includes the webspaces between your toes.  Use Goldbond medicated powder, or Lumi whole-body deodorant, to help cut down on moisture.  You may also want to discuss with dermatology about Botox treatment to help cut down on excessive perspiration.  If you develop any increased redness, pain, red streaks, or fever please return for reevaluation or see your primary care provider.     ED Prescriptions     Medication Sig Dispense Auth. Provider   clotrimazole-betamethasone (LOTRISONE) cream Apply to affected area 2 times daily until the rash has resolved and then for 3 additional days. 45 g Becky Augusta, NP      PDMP not reviewed this encounter.   Becky Augusta, NP 08/18/23 1925

## 2023-08-20 ENCOUNTER — Ambulatory Visit: Payer: MEDICAID

## 2023-08-20 DIAGNOSIS — M25662 Stiffness of left knee, not elsewhere classified: Secondary | ICD-10-CM

## 2023-08-20 DIAGNOSIS — M6281 Muscle weakness (generalized): Secondary | ICD-10-CM

## 2023-08-20 DIAGNOSIS — M25661 Stiffness of right knee, not elsewhere classified: Secondary | ICD-10-CM

## 2023-08-20 DIAGNOSIS — R278 Other lack of coordination: Secondary | ICD-10-CM

## 2023-08-20 DIAGNOSIS — R262 Difficulty in walking, not elsewhere classified: Secondary | ICD-10-CM

## 2023-08-20 DIAGNOSIS — R2681 Unsteadiness on feet: Secondary | ICD-10-CM

## 2023-08-20 NOTE — Therapy (Signed)
OUTPATIENT PHYSICAL THERAPY NEURO TREATMENT NOTE   Patient Name: Renee Walton MRN: [id number] DOB:1996-06-18, 27 y.o., female Today's Date: 08/20/2023   PCP: [name], MD REFERRING PROVIDER:   [name], MD    END OF SESSION:  PT End of Session - 08/20/23 1536     Visit Number 26    Number of Visits 49    Date for PT Re-Evaluation 10/22/23    Authorization Type Medicaid [place] Access    Authorization Time Period 02/25/22-06/02/22 for 12 visits    PT Start Time 1447    PT Stop Time 1528    PT Time Calculation (min) 41 min    Equipment Utilized During Treatment Gait belt    Activity Tolerance Patient tolerated treatment well    Behavior During Therapy WFL for tasks assessed/performed                    Past Medical History:  Diagnosis Date   Breakthrough bleeding    Cerebral palsy (HCC)    GAD (generalized anxiety disorder)    Migraine headache    Past Surgical History:  Procedure Laterality Date   LEG SURGERY     bilateral x2 in 2008   Patient Active Problem List   Diagnosis Date Noted   Seasonal allergic rhinitis due to pollen 03/27/2023   Bronchitis 03/13/2023   Gastroesophageal reflux disease without esophagitis 02/24/2023   Migraine without aura and without status migrainosus, not intractable 02/24/2023   GAD (generalized anxiety disorder) 02/24/2023   Loss of weight 07/17/2015   Depression 07/17/2015   Cerebral palsy (HCC) 07/17/2015    ONSET DATE: 01-23-1994 pt has CP  REFERRING DIAG:   G80.9 (ICD-10-CM) - Cerebral palsy (HCC)    THERAPY DIAG:  Joint stiffness of right lower leg  Joint stiffness of left lower leg  Muscle weakness (generalized)  Unsteadiness on feet  Other lack of coordination  Difficulty in walking, not elsewhere classified  Rationale for Evaluation and Treatment: Rehabilitation  SUBJECTIVE:                                                                                                                                                                                              SUBJECTIVE STATEMENT    Pt reports she went to ED Oct 28th due to rash on foot, she says this is much better. She reports she is anxious currently, she has not been walking by herself, it doesn't hurt to walk and is wondering if her anxiety is limiting her walking. Sometimes when she sits she feels her muscle is "twisting." Pt had botox on  the 15th.    Pt accompanied by: self , mother  PERTINENT HISTORY:    Pt is a pleasant 27 y/o female with CP who is returning to PT after absence and trip to [place]. She would like to improve her mobility, gait and balance. Pt has continued her PT exercises while away and worked on gait, and wants to become independent using a RW. She reports 2 recent falls, and no injuries. Pt also with stiffness in BLEs. PMH consists of the following: CP, depression, GAD, migraine headache, hx of LE surgery bilateral x2 in 2008.    PAIN: from eval Are you having pain? No  PRECAUTIONS: Fall  WEIGHT BEARING RESTRICTIONS: No  FALLS: Has patient fallen in last 6 months? Yes. Number of falls 2 when walking. She reports no injury. She got back up independently   PATIENT GOALS: Pt would like to improve her ability to ambulate with RW both inside and over outside surfaces in order to increase her independence with mobility. She would like to learn how to safely get in and out of a car with her RW.  OBJECTIVE:   Today's Treatment: 08/20/23 Gait belt donned and CGA provided for upright activities unless otherwise noted   Manual: Long duration stretching (several minutes) of the following: Bilateral adductors  Bilateral hamstrings - slightly more extensibility observed with RLE compared to LLE  TE: Gait with RW and 3# AW donned each LE 2x 148 ft - no pain with gait 3# AW each LE: -standing hip abduction/step out 3x10 each LE -Mini squats 2x10  NMR: At suppor surface for safety Standing EO  x 30 sec Standing EC x 30 sec Standing EO with horizontal and vertical head turns 2x10 of each Standing EC with horizontal and vertical head turns 2x10 of each Comments: Pt required no greater than CGA throughout, exception was one instance of min assist for EC vertical head turns   PATIENT EDUCATION: Education details: Pt educated throughout session about proper posture and technique with exercises. Improved exercise technique, movement at target joints, use of target muscles after min to mod verbal, visual, tactile cues.   Person educated: Patient Education method: Explanation, Demonstration, and Verbal cues Education comprehension: verbalized understanding and returned demonstration  HOME EXERCISE PROGRAM See pt instruction section   GOALS: Goals reviewed with patient? Yes  SHORT TERM GOALS: Target date: 12/03/2022  Patient will be independent in home exercise program to improve strength/mobility for better functional independence with ADLs. Baseline: to be initiated next 1-2 visits; 02/19/2023: pt not consistently performing HEP; 04/23/23:  Pt reports she goes to gym every three days; 8/7: Pt reports she has been increasing her activity levels, but currently waiting for caregiver to start to be able to go to gym Goal status: IN PROGRESS  LONG TERM GOALS: Target date: 07/16/2023   Patient will increase FOTO score to equal to or greater than  76  to demonstrate statistically significant improvement in mobility and quality of life.  Baseline: risk adjusted 51; 02/19/2023: 63 * pt reporting pain in left leg limiting stair climbing; 04/23/23: 81 Goal status: MET  2.  Pt will reduce 5xsSTS time to < 8 sec to demonstrate clinically significant improvement in LE strength and balance to reduce risk of falls.  Baseline: previously 12.76 sec on 06/10/22, now 10 sec to RW on 10/22/2022; 02/19/2023 8.5 seconds hands-free; 04/23/23: 7 seconds goal status: MET  3.  Pt will improve 6 MWT to > 1000' with no AD  and no  LOB to demonstrate ability to improve safety with community distances. Baseline: was 822 ft back in August 2023, now 808 with RW, CGA; 02/19/2023 845 ft with 1OX; 04/23/23: 820 ft 4WW; 05/28/23:  728 ft with RW, limited by LLE pain; 07/30/2023: 878 ft with RW Goal status: PARTIALLY MET  4.  Pt will improve 10 m gait speed with LRAD to > 1.0 m/s to demonstrate reduced risk of falls along with safe ability to complete community walking tasks Baseline: previously 0.7 m/s on 06/10/2022, now 0.77 m/s with RW; 02/19/2023: 0.82 m/s with RW; 04/23/23: 0.95 m/s with RW; 05/28/23: 0.83 m/s pain limited LLE; 10/9: 1.08 m/s with RW  Goal status: MET  5.  Patient will increase Berg Balance score by > 6 points to demonstrate decreased fall risk during functional activities.  Baseline: previously 35/56 on 03/11/22(?), test deferred to next appt; 11/13/2022: 36/56; 02/19/2023: 39/56; 04/23/23: 42/56  Goal status: MET  6.  Pt will report improved stool consistency type 4 across 50% of the time and demo less flexed coccyx across 2 weeks in order to promote bowel elimination  Baseline: Pt has Type 1 stool consistency based on Bristol stool scale 100% and her mom has to put boiled water to soften the stool to flush every time.  Coccyx flexed  Goal status: NEW    ASSESSMENT:  CLINICAL IMPRESSION: Pt able to progress static standing interventions with mostly requiring no more than CGA, only one instance of min assist to correct LOB. Pt exhibited good balance with ability to ambulate pain free with 3# ankle weights. The pt will benefit from further skilled PT to improve balance, gait and mobility.   OBJECTIVE IMPAIRMENTS: Abnormal gait, decreased activity tolerance, decreased balance, decreased coordination, decreased endurance, decreased knowledge of use of DME, decreased mobility, difficulty walking, decreased strength, impaired tone, improper body mechanics, postural dysfunction, and pain.   ACTIVITY LIMITATIONS: carrying,  lifting, bending, standing, squatting, stairs, transfers, dressing, and locomotion level  PARTICIPATION LIMITATIONS: meal prep, cleaning, shopping, community activity, and yard work  PERSONAL FACTORS: Fitness, Sex, Time since onset of injury/illness/exacerbation, and 3+ comorbidities: PMH consists of the following: CP, depression, GAD, migraine headache, hx of LE surgery bilateral x2 in 2008  are also affecting patient's functional outcome.   REHAB POTENTIAL: Good  CLINICAL DECISION MAKING: Evolving/moderate complexity  EVALUATION COMPLEXITY: Moderate  PLAN:  PT FREQUENCY: 1-2x/week  PT DURATION: 12 weeks  PLANNED INTERVENTIONS: Therapeutic exercises, Therapeutic activity, Neuromuscular re-education, Balance training, Gait training, Patient/Family education, Self Care, Joint mobilization, Stair training, Vestibular training, Canalith repositioning, Visual/preceptual remediation/compensation, Orthotic/Fit training, DME instructions, Dry Needling, Electrical stimulation, Wheelchair mobility training, Spinal mobilization, Cryotherapy, Moist heat, Splintting, Taping, Biofeedback, Manual therapy, and Re-evaluation  PLAN FOR NEXT SESSION: , training with walker/transfer (how to fold walker, get in/out of car), training and uneven surfaces, LE strengthening, gait exercises/step-length/gait-speed, stretching, stairs, [employer] exercises, curbs  Baird Kay, PT 08/20/2023, 5:20 PM  5:20 PM, 08/20/23 Physical Therapist - [facility]

## 2023-08-26 NOTE — Progress Notes (Signed)
Patient notified

## 2023-08-27 ENCOUNTER — Ambulatory Visit: Payer: MEDICAID

## 2023-08-27 ENCOUNTER — Other Ambulatory Visit: Payer: Self-pay | Admitting: Internal Medicine

## 2023-08-27 DIAGNOSIS — Z3041 Encounter for surveillance of contraceptive pills: Secondary | ICD-10-CM

## 2023-08-27 DIAGNOSIS — R110 Nausea: Secondary | ICD-10-CM

## 2023-09-03 ENCOUNTER — Ambulatory Visit: Payer: MEDICAID | Attending: Internal Medicine

## 2023-09-03 DIAGNOSIS — M25662 Stiffness of left knee, not elsewhere classified: Secondary | ICD-10-CM | POA: Insufficient documentation

## 2023-09-03 DIAGNOSIS — R2681 Unsteadiness on feet: Secondary | ICD-10-CM | POA: Diagnosis present

## 2023-09-03 DIAGNOSIS — M6281 Muscle weakness (generalized): Secondary | ICD-10-CM | POA: Diagnosis present

## 2023-09-03 DIAGNOSIS — R278 Other lack of coordination: Secondary | ICD-10-CM | POA: Insufficient documentation

## 2023-09-03 DIAGNOSIS — R262 Difficulty in walking, not elsewhere classified: Secondary | ICD-10-CM | POA: Diagnosis present

## 2023-09-03 DIAGNOSIS — M25661 Stiffness of right knee, not elsewhere classified: Secondary | ICD-10-CM | POA: Insufficient documentation

## 2023-09-03 NOTE — Therapy (Signed)
OUTPATIENT PHYSICAL THERAPY NEURO TREATMENT NOTE   Patient Name: Renee Walton MRN: [id number] DOB:[birth year], 27 y.o., female Today's Date: 09/03/2023   PCP: [name], MD REFERRING PROVIDER:   [name], MD    END OF SESSION:  PT End of Session - 09/03/23 1535     Visit Number 27    Number of Visits 49    Date for PT Re-Evaluation 10/22/23    Authorization Type Medicaid [place] Access    Authorization Time Period 02/25/22-06/02/22 for 12 visits    PT Start Time 1448    PT Stop Time 1530    PT Time Calculation (min) 42 min    Equipment Utilized During Treatment Gait belt    Activity Tolerance Patient tolerated treatment well    Behavior During Therapy WFL for tasks assessed/performed                     Past Medical History:  Diagnosis Date   Breakthrough bleeding    Cerebral palsy (HCC)    GAD (generalized anxiety disorder)    Migraine headache    Past Surgical History:  Procedure Laterality Date   LEG SURGERY     bilateral x2 in 2008   Patient Active Problem List   Diagnosis Date Noted   Seasonal allergic rhinitis due to pollen 03/27/2023   Bronchitis 03/13/2023   Gastroesophageal reflux disease without esophagitis 02/24/2023   Migraine without aura and without status migrainosus, not intractable 02/24/2023   GAD (generalized anxiety disorder) 02/24/2023   Loss of weight 07/17/2015   Depression 07/17/2015   Cerebral palsy (HCC) 07/17/2015    ONSET DATE: 17-Feb-1994 pt has CP  REFERRING DIAG:   G80.9 (ICD-10-CM) - Cerebral palsy (HCC)    THERAPY DIAG:  Joint stiffness of left lower leg  Joint stiffness of right lower leg  Unsteadiness on feet  Other lack of coordination  Difficulty in walking, not elsewhere classified  Rationale for Evaluation and Treatment: Rehabilitation  SUBJECTIVE:                                                                                                                                                                                              SUBJECTIVE STATEMENT    Pt reports she had really good week regarding her mobility, but that she feels she is starting over this week. She reports difficulty picking up her feet/tightness in BLE.  Pt accompanied by: self , mother  PERTINENT HISTORY:    Pt is a pleasant 27 y/o female with CP who is returning to PT after absence and trip to [place]. She would  like to improve her mobility, gait and balance. Pt has continued her PT exercises while away and worked on gait, and wants to become independent using a RW. She reports 2 recent falls, and no injuries. Pt also with stiffness in BLEs. PMH consists of the following: CP, depression, GAD, migraine headache, hx of LE surgery bilateral x2 in 2008.    PAIN: from eval Are you having pain? No  PRECAUTIONS: Fall  WEIGHT BEARING RESTRICTIONS: No  FALLS: Has patient fallen in last 6 months? Yes. Number of falls 2 when walking. She reports no injury. She got back up independently   PATIENT GOALS: Pt would like to improve her ability to ambulate with RW both inside and over outside surfaces in order to increase her independence with mobility. She would like to learn how to safely get in and out of a car with her RW.  OBJECTIVE:   Today's Treatment: 09/03/23 Gait belt donned and CGA provided for upright activities unless otherwise noted   Manual: Long duration stretching (several minutes) of the following: Gastrocs PROM ankle DF/PF each LE Bilateral adductors  Bilateral hamstrings   NMR: At suppor surface for safety Gait with RW x 148 ft - CGA - no significant changes in gait noted from previous session In // bars: Gait without UE support FWD, then with turns with intermittent UE support x multiple reps length of // bars LTL stepping with intermittent UE support Stepping over half-foam and green pad x multiple reps each LE, difficulty fully clearing obstacle   PATIENT  EDUCATION: Education details: Pt educated throughout session about proper posture and technique with exercises. Improved exercise technique, movement at target joints, use of target muscles after min to mod verbal, visual, tactile cues.   Person educated: Patient Education method: Explanation, Demonstration, and Verbal cues Education comprehension: verbalized understanding and returned demonstration  HOME EXERCISE PROGRAM See pt instruction section   GOALS: Goals reviewed with patient? Yes  SHORT TERM GOALS: Target date: 12/03/2022  Patient will be independent in home exercise program to improve strength/mobility for better functional independence with ADLs. Baseline: to be initiated next 1-2 visits; 02/19/2023: pt not consistently performing HEP; 04/23/23:  Pt reports she goes to gym every three days; 8/7: Pt reports she has been increasing her activity levels, but currently waiting for caregiver to start to be able to go to gym Goal status: IN PROGRESS  LONG TERM GOALS: Target date: 07/16/2023   Patient will increase FOTO score to equal to or greater than  76  to demonstrate statistically significant improvement in mobility and quality of life.  Baseline: risk adjusted 51; 02/19/2023: 63 * pt reporting pain in left leg limiting stair climbing; 04/23/23: 81 Goal status: MET  2.  Pt will reduce 5xsSTS time to < 8 sec to demonstrate clinically significant improvement in LE strength and balance to reduce risk of falls.  Baseline: previously 12.76 sec on 06/10/22, now 10 sec to RW on 10/22/2022; 02/19/2023 8.5 seconds hands-free; 04/23/23: 7 seconds goal status: MET  3.  Pt will improve 6 MWT to > 1000' with no AD and no LOB to demonstrate ability to improve safety with community distances. Baseline: was 822 ft back in August 2023, now 808 with RW, CGA; 02/19/2023 845 ft with 4UJ; 04/23/23: 820 ft 4WW; 05/28/23:  728 ft with RW, limited by LLE pain; 07/30/2023: 878 ft with RW Goal status: PARTIALLY MET  4.  Pt  will improve 10 m gait speed with LRAD to > 1.0  m/s to demonstrate reduced risk of falls along with safe ability to complete community walking tasks Baseline: previously 0.7 m/s on 06/10/2022, now 0.77 m/s with RW; 02/19/2023: 0.82 m/s with RW; 04/23/23: 0.95 m/s with RW; 05/28/23: 0.83 m/s pain limited LLE; 10/9: 1.08 m/s with RW  Goal status: MET  5.  Patient will increase Berg Balance score by > 6 points to demonstrate decreased fall risk during functional activities.  Baseline: previously 35/56 on 03/11/22(?), test deferred to next appt; 11/13/2022: 36/56; 02/19/2023: 39/56; 04/23/23: 42/56  Goal status: MET  6.  Pt will report improved stool consistency type 4 across 50% of the time and demo less flexed coccyx across 2 weeks in order to promote bowel elimination  Baseline: Pt has Type 1 stool consistency based on Bristol stool scale 100% and her mom has to put boiled water to soften the stool to flush every time.  Coccyx flexed  Goal status: NEW    ASSESSMENT:  CLINICAL IMPRESSION: Pt presented to PT reporting she had some increased difficulty this past week with gait. Pt's gait assessed in session without observed changes, and pt reported no difficulty during visit. Will continue to monitor. The pt will benefit from further skilled PT to improve balance, gait and mobility.   OBJECTIVE IMPAIRMENTS: Abnormal gait, decreased activity tolerance, decreased balance, decreased coordination, decreased endurance, decreased knowledge of use of DME, decreased mobility, difficulty walking, decreased strength, impaired tone, improper body mechanics, postural dysfunction, and pain.   ACTIVITY LIMITATIONS: carrying, lifting, bending, standing, squatting, stairs, transfers, dressing, and locomotion level  PARTICIPATION LIMITATIONS: meal prep, cleaning, shopping, community activity, and yard work  PERSONAL FACTORS: Fitness, Sex, Time since onset of injury/illness/exacerbation, and 3+ comorbidities: PMH consists of  the following: CP, depression, GAD, migraine headache, hx of LE surgery bilateral x2 in 2008  are also affecting patient's functional outcome.   REHAB POTENTIAL: Good  CLINICAL DECISION MAKING: Evolving/moderate complexity  EVALUATION COMPLEXITY: Moderate  PLAN:  PT FREQUENCY: 1-2x/week  PT DURATION: 12 weeks  PLANNED INTERVENTIONS: Therapeutic exercises, Therapeutic activity, Neuromuscular re-education, Balance training, Gait training, Patient/Family education, Self Care, Joint mobilization, Stair training, Vestibular training, Canalith repositioning, Visual/preceptual remediation/compensation, Orthotic/Fit training, DME instructions, Dry Needling, Electrical stimulation, Wheelchair mobility training, Spinal mobilization, Cryotherapy, Moist heat, Splintting, Taping, Biofeedback, Manual therapy, and Re-evaluation  PLAN FOR NEXT SESSION: , training with walker/transfer (how to fold walker, get in/out of car), training and uneven surfaces, LE strengthening, gait exercises/step-length/gait-speed, stretching, stairs, [employer] exercises, curbs  Baird Kay, PT 09/03/2023, 3:39 PM  3:39 PM, 09/03/23 Physical Therapist - [facility]

## 2023-09-10 ENCOUNTER — Ambulatory Visit: Payer: MEDICAID

## 2023-09-10 DIAGNOSIS — M25661 Stiffness of right knee, not elsewhere classified: Secondary | ICD-10-CM

## 2023-09-10 DIAGNOSIS — M6281 Muscle weakness (generalized): Secondary | ICD-10-CM

## 2023-09-10 DIAGNOSIS — M25662 Stiffness of left knee, not elsewhere classified: Secondary | ICD-10-CM | POA: Diagnosis not present

## 2023-09-10 DIAGNOSIS — R262 Difficulty in walking, not elsewhere classified: Secondary | ICD-10-CM

## 2023-09-10 DIAGNOSIS — R2681 Unsteadiness on feet: Secondary | ICD-10-CM

## 2023-09-10 NOTE — Therapy (Unsigned)
OUTPATIENT PHYSICAL THERAPY NEURO TREATMENT NOTE   Patient Name: Renee Walton MRN: [id number] DOB:13-Oct-1996, 27 y.o., female Today's Date: 09/11/2023   PCP: [name], MD REFERRING PROVIDER:   [name], MD    END OF SESSION:  PT End of Session - 09/11/23 0819     Visit Number 28    Number of Visits 49    Date for PT Re-Evaluation 10/22/23    Authorization Type Medicaid [place] Access    Authorization Time Period 02/25/22-06/02/22 for 12 visits    PT Start Time 1447    PT Stop Time 1528    PT Time Calculation (min) 41 min    Equipment Utilized During Treatment Gait belt    Activity Tolerance Patient tolerated treatment well    Behavior During Therapy WFL for tasks assessed/performed                      Past Medical History:  Diagnosis Date   Breakthrough bleeding    Cerebral palsy (HCC)    GAD (generalized anxiety disorder)    Migraine headache    Past Surgical History:  Procedure Laterality Date   LEG SURGERY     bilateral x2 in 2008   Patient Active Problem List   Diagnosis Date Noted   Seasonal allergic rhinitis due to pollen 03/27/2023   Bronchitis 03/13/2023   Gastroesophageal reflux disease without esophagitis 02/24/2023   Migraine without aura and without status migrainosus, not intractable 02/24/2023   GAD (generalized anxiety disorder) 02/24/2023   Loss of weight 07/17/2015   Depression 07/17/2015   Cerebral palsy (HCC) 07/17/2015    ONSET DATE: Jul 24, 1994 pt has CP  REFERRING DIAG:   G80.9 (ICD-10-CM) - Cerebral palsy (HCC)    THERAPY DIAG:  Joint stiffness of left lower leg  Joint stiffness of right lower leg  Muscle weakness (generalized)  Difficulty in walking, not elsewhere classified  Unsteadiness on feet  Rationale for Evaluation and Treatment: Rehabilitation  SUBJECTIVE:                                                                                                                                                                                              SUBJECTIVE STATEMENT    Pt says her walking has improved since last seen. She thinks the cold has been affecting her gait.   Pt accompanied by: self , mother  PERTINENT HISTORY:    Pt is a pleasant 27 y/o female with CP who is returning to PT after absence and trip to [place]. She would like to improve her mobility, gait and balance. Pt has  continued her PT exercises while away and worked on gait, and wants to become independent using a RW. She reports 2 recent falls, and no injuries. Pt also with stiffness in BLEs. PMH consists of the following: CP, depression, GAD, migraine headache, hx of LE surgery bilateral x2 in 2008.    PAIN: from eval Are you having pain? No  PRECAUTIONS: Fall  WEIGHT BEARING RESTRICTIONS: No  FALLS: Has patient fallen in last 6 months? Yes. Number of falls 2 when walking. She reports no injury. She got back up independently   PATIENT GOALS: Pt would like to improve her ability to ambulate with RW both inside and over outside surfaces in order to increase her independence with mobility. She would like to learn how to safely get in and out of a car with her RW.  OBJECTIVE:   Today's Treatment: 09/11/23 Gait belt donned and CGA provided for upright activities unless otherwise noted   Manual: on mat table Long duration stretching and/or PROM (several minutes) of the following: Gastrocsol complex stretch bilat LE PROM ankle DF/PF each LE Knee to chest stretch bilat LE Bilateral adductors  Hip abduction PROM each LE Bilateral hamstring stretch  TE: BLE bolster supported on plinth -SLR  2x10 each LE -March with blue tband 2x10 each LE  -Bridges 2x10, with BTB around BLE 10x -STS 10x   PATIENT EDUCATION: Education details: Pt educated throughout session about proper posture and technique with exercises. Improved exercise technique, movement at target joints, use of target muscles after min to mod  verbal, visual, tactile cues.   Person educated: Patient Education method: Explanation, Demonstration, and Verbal cues Education comprehension: verbalized understanding and returned demonstration  HOME EXERCISE PROGRAM See pt instruction section   GOALS: Goals reviewed with patient? Yes  SHORT TERM GOALS: Target date: 12/03/2022  Patient will be independent in home exercise program to improve strength/mobility for better functional independence with ADLs. Baseline: to be initiated next 1-2 visits; 02/19/2023: pt not consistently performing HEP; 04/23/23:  Pt reports she goes to gym every three days; 8/7: Pt reports she has been increasing her activity levels, but currently waiting for caregiver to start to be able to go to gym Goal status: IN PROGRESS  LONG TERM GOALS: Target date: 07/16/2023   Patient will increase FOTO score to equal to or greater than  76  to demonstrate statistically significant improvement in mobility and quality of life.  Baseline: risk adjusted 51; 02/19/2023: 63 * pt reporting pain in left leg limiting stair climbing; 04/23/23: 81 Goal status: MET  2.  Pt will reduce 5xsSTS time to < 8 sec to demonstrate clinically significant improvement in LE strength and balance to reduce risk of falls.  Baseline: previously 12.76 sec on 06/10/22, now 10 sec to RW on 10/22/2022; 02/19/2023 8.5 seconds hands-free; 04/23/23: 7 seconds goal status: MET  3.  Pt will improve 6 MWT to > 1000' with no AD and no LOB to demonstrate ability to improve safety with community distances. Baseline: was 822 ft back in August 2023, now 808 with RW, CGA; 02/19/2023 845 ft with 1BJ; 04/23/23: 820 ft 4WW; 05/28/23:  728 ft with RW, limited by LLE pain; 07/30/2023: 878 ft with RW Goal status: PARTIALLY MET  4.  Pt will improve 10 m gait speed with LRAD to > 1.0 m/s to demonstrate reduced risk of falls along with safe ability to complete community walking tasks Baseline: previously 0.7 m/s on 06/10/2022, now 0.77 m/s  with RW; 02/19/2023: 0.82  m/s with RW; 04/23/23: 0.95 m/s with RW; 05/28/23: 0.83 m/s pain limited LLE; 10/9: 1.08 m/s with RW  Goal status: MET  5.  Patient will increase Berg Balance score by > 6 points to demonstrate decreased fall risk during functional activities.  Baseline: previously 35/56 on 03/11/22(?), test deferred to next appt; 11/13/2022: 36/56; 02/19/2023: 39/56; 04/23/23: 42/56  Goal status: MET  6.  Pt will report improved stool consistency type 4 across 50% of the time and demo less flexed coccyx across 2 weeks in order to promote bowel elimination  Baseline: Pt has Type 1 stool consistency based on Bristol stool scale 100% and her mom has to put boiled water to soften the stool to flush every time.  Coccyx flexed  Goal status: NEW    ASSESSMENT:  CLINICAL IMPRESSION: Pt presents with reported improvement in gait. Large part of visit dedicated to improving LE mobility/tissue extensibility of BLE through long duration stretching and PROM. Pt still exhibits increased stiffness of RLE>LLE. She rated majority of therex as easy, but did exhibit some difficulty with eccentric control with movement. The pt will benefit from further skilled PT to improve balance, gait and mobility.   OBJECTIVE IMPAIRMENTS: Abnormal gait, decreased activity tolerance, decreased balance, decreased coordination, decreased endurance, decreased knowledge of use of DME, decreased mobility, difficulty walking, decreased strength, impaired tone, improper body mechanics, postural dysfunction, and pain.   ACTIVITY LIMITATIONS: carrying, lifting, bending, standing, squatting, stairs, transfers, dressing, and locomotion level  PARTICIPATION LIMITATIONS: meal prep, cleaning, shopping, community activity, and yard work  PERSONAL FACTORS: Fitness, Sex, Time since onset of injury/illness/exacerbation, and 3+ comorbidities: PMH consists of the following: CP, depression, GAD, migraine headache, hx of LE surgery bilateral x2 in  2008  are also affecting patient's functional outcome.   REHAB POTENTIAL: Good  CLINICAL DECISION MAKING: Evolving/moderate complexity  EVALUATION COMPLEXITY: Moderate  PLAN:  PT FREQUENCY: 1-2x/week  PT DURATION: 12 weeks  PLANNED INTERVENTIONS: Therapeutic exercises, Therapeutic activity, Neuromuscular re-education, Balance training, Gait training, Patient/Family education, Self Care, Joint mobilization, Stair training, Vestibular training, Canalith repositioning, Visual/preceptual remediation/compensation, Orthotic/Fit training, DME instructions, Dry Needling, Electrical stimulation, Wheelchair mobility training, Spinal mobilization, Cryotherapy, Moist heat, Splintting, Taping, Biofeedback, Manual therapy, and Re-evaluation  PLAN FOR NEXT SESSION: balance, obstacle clearance, stretching, strength  Baird Kay, PT 09/11/2023, 8:28 AM  8:28 AM, 09/11/23 Physical Therapist - [facility]

## 2023-09-17 ENCOUNTER — Ambulatory Visit: Payer: MEDICAID

## 2023-09-21 NOTE — Progress Notes (Signed)
   CHIEF COMPLAINT  UA/ only visit fot UTI     REASON FOR VISIT  Possible UTI, UA Visit Only    Results for orders placed or performed in visit on 08/13/23  POCT urinalysis dipstick  Result Value Ref Range   Color, UA     Clarity, UA     Glucose, UA Negative Negative   Bilirubin, UA Negative    Ketones, UA Negative    Spec Grav, UA >=1.030 (A) 1.010 - 1.025   Blood, UA Negative    pH, UA 6.0 5.0 - 8.0   Protein, UA Negative Negative   Urobilinogen, UA 0.2 0.2 or 1.0 E.U./dL   Nitrite, UA Negative    Leukocytes, UA Negative Negative   Appearance     Odor      ASSESSMENT & PLAN Diagnoses and all orders for this visit:  Frequent urination -     POCT urinalysis dipstick     Patient notified.  Total time spent: 5 minutes  Miki Kins, FNP 08/13/2023

## 2023-09-24 ENCOUNTER — Ambulatory Visit: Payer: MEDICAID | Attending: Internal Medicine

## 2023-09-24 DIAGNOSIS — R2681 Unsteadiness on feet: Secondary | ICD-10-CM | POA: Diagnosis present

## 2023-09-24 DIAGNOSIS — R262 Difficulty in walking, not elsewhere classified: Secondary | ICD-10-CM | POA: Insufficient documentation

## 2023-09-24 DIAGNOSIS — R278 Other lack of coordination: Secondary | ICD-10-CM | POA: Insufficient documentation

## 2023-09-24 DIAGNOSIS — M25661 Stiffness of right knee, not elsewhere classified: Secondary | ICD-10-CM | POA: Diagnosis present

## 2023-09-24 DIAGNOSIS — M6281 Muscle weakness (generalized): Secondary | ICD-10-CM | POA: Diagnosis present

## 2023-09-24 DIAGNOSIS — M25662 Stiffness of left knee, not elsewhere classified: Secondary | ICD-10-CM | POA: Diagnosis present

## 2023-09-24 NOTE — Therapy (Signed)
OUTPATIENT PHYSICAL THERAPY NEURO TREATMENT NOTE   Patient Name: Renee Walton MRN: [id number] DOB:December 02, 1995, 27 y.o., female Today's Date: 09/24/2023   PCP: [name], MD REFERRING PROVIDER:   [name], MD    END OF SESSION:  PT End of Session - 09/24/23 1450     Visit Number 29    Number of Visits 49    Date for PT Re-Evaluation 10/22/23    Authorization Type Medicaid [place] Access    Authorization Time Period 02/25/22-06/02/22 for 12 visits    PT Start Time 1449    PT Stop Time 1530    PT Time Calculation (min) 41 min    Equipment Utilized During Treatment Gait belt    Activity Tolerance Patient tolerated treatment well    Behavior During Therapy WFL for tasks assessed/performed                      Past Medical History:  Diagnosis Date   Breakthrough bleeding    Cerebral palsy (HCC)    GAD (generalized anxiety disorder)    Migraine headache    Past Surgical History:  Procedure Laterality Date   LEG SURGERY     bilateral x2 in 2008   Patient Active Problem List   Diagnosis Date Noted   Seasonal allergic rhinitis due to pollen 03/27/2023   Bronchitis 03/13/2023   Gastroesophageal reflux disease without esophagitis 02/24/2023   Migraine without aura and without status migrainosus, not intractable 02/24/2023   GAD (generalized anxiety disorder) 02/24/2023   Loss of weight 07/17/2015   Depression 07/17/2015   Cerebral palsy (HCC) 07/17/2015    ONSET DATE: 1994[birth year]03[birth year] pt has CP  REFERRING DIAG:   G80.9 (ICD-10-CM) - Cerebral palsy (HCC)    THERAPY DIAG:  Joint stiffness of right lower leg  Joint stiffness of left lower leg  Unsteadiness on feet  Difficulty in walking, not elsewhere classified  Muscle weakness (generalized)  Rationale for Evaluation and Treatment: Rehabilitation  SUBJECTIVE:                                                                                                                                                                                              SUBJECTIVE STATEMENT    Pt reports as of this morning feeling some sharp pain in R lateral thigh with activity and feeling like her legs are heavy, but reports no swelling, no redness. She has been walking a lot without her walker.  She repots her balance is feeling a little different/off.  She reports she is walking and picking up stuff without her AD.  Pt accompanied  by: self   PERTINENT HISTORY:    Pt is a pleasant 27 y/o female with CP who is returning to PT after absence and trip to [place]. She would like to improve her mobility, gait and balance. Pt has continued her PT exercises while away and worked on gait, and wants to become independent using a RW. She reports 2 recent falls, and no injuries. Pt also with stiffness in BLEs. PMH consists of the following: CP, depression, GAD, migraine headache, hx of LE surgery bilateral x2 in 2008.    PAIN: from eval Are you having pain? No  PRECAUTIONS: Fall  WEIGHT BEARING RESTRICTIONS: No  FALLS: Has patient fallen in last 6 months? Yes. Number of falls 2 when walking. She reports no injury. She got back up independently   PATIENT GOALS: Pt would like to improve her ability to ambulate with RW both inside and over outside surfaces in order to increase her independence with mobility. She would like to learn how to safely get in and out of a car with her RW.  OBJECTIVE:   Today's Treatment: 09/24/23 Gait belt donned and CGA provided for upright activities unless otherwise noted   NMR: In // bars: several reps of the following interventions Gait in // bars with turns - intermittent UE support Gait with vertical and horizontal head turns  Gait with stepping over obstacle (LTLstep) UE support throughout Gait with dual motor task (ball on cone) - does not drop ball, successfully completes, but slowly   Manual: on mat table Long duration stretching and/or PROM (several minutes) of the  following: Gastrocsol complex stretch bilat LE PROM ankle DF/PF each LE Knee to chest stretch bilat LE Bilateral adductors  Hip abduction PROM each LE Bilateral hamstring   No pain throughout session   PATIENT EDUCATION: Education details: Pt educated throughout session about proper posture and technique with exercises. Improved exercise technique, movement at target joints, use of target muscles after min to mod verbal, visual, tactile cues.   Person educated: Patient Education method: Explanation, Demonstration, and Verbal cues Education comprehension: verbalized understanding and returned demonstration  HOME EXERCISE PROGRAM See pt instruction section   GOALS: Goals reviewed with patient? Yes  SHORT TERM GOALS: Target date: 12/03/2022  Patient will be independent in home exercise program to improve strength/mobility for better functional independence with ADLs. Baseline: to be initiated next 1-2 visits; 02/19/2023: pt not consistently performing HEP; 04/23/23:  Pt reports she goes to gym every three days; 8/7: Pt reports she has been increasing her activity levels, but currently waiting for caregiver to start to be able to go to gym Goal status: IN PROGRESS  LONG TERM GOALS: Target date: 07/16/2023   Patient will increase FOTO score to equal to or greater than  76  to demonstrate statistically significant improvement in mobility and quality of life.  Baseline: risk adjusted 51; 02/19/2023: 63 * pt reporting pain in left leg limiting stair climbing; 04/23/23: 81 Goal status: MET  2.  Pt will reduce 5xsSTS time to < 8 sec to demonstrate clinically significant improvement in LE strength and balance to reduce risk of falls.  Baseline: previously 12.76 sec on 06/10/22, now 10 sec to RW on 10/22/2022; 02/19/2023 8.5 seconds hands-free; 04/23/23: 7 seconds goal status: MET  3.  Pt will improve 6 MWT to > 1000' with no AD and no LOB to demonstrate ability to improve safety with community  distances. Baseline: was 822 ft back in August 2023, now 808 with RW,  CGA; 02/19/2023 845 ft with 1OX; 04/23/23: 820 ft 4WW; 05/28/23:  728 ft with RW, limited by LLE pain; 07/30/2023: 878 ft with RW Goal status: PARTIALLY MET  4.  Pt will improve 10 m gait speed with LRAD to > 1.0 m/s to demonstrate reduced risk of falls along with safe ability to complete community walking tasks Baseline: previously 0.7 m/s on 06/10/2022, now 0.77 m/s with RW; 02/19/2023: 0.82 m/s with RW; 04/23/23: 0.95 m/s with RW; 05/28/23: 0.83 m/s pain limited LLE; 10/9: 1.08 m/s with RW  Goal status: MET  5.  Patient will increase Berg Balance score by > 6 points to demonstrate decreased fall risk during functional activities.  Baseline: previously 35/56 on 03/11/22(?), test deferred to next appt; 11/13/2022: 36/56; 02/19/2023: 39/56; 04/23/23: 42/56  Goal status: MET  6.  Pt will report improved stool consistency type 4 across 50% of the time and demo less flexed coccyx across 2 weeks in order to promote bowel elimination  Baseline: Pt has Type 1 stool consistency based on Bristol stool scale 100% and her mom has to put boiled water to soften the stool to flush every time.  Coccyx flexed  Goal status: NEW    ASSESSMENT:  CLINICAL IMPRESSION: Pt presented to session reporting RLE pain today, but responded to all interventions well without pain. She was able to complete higher level balance activities such as ambulating with dual motor task. While she successfully completed this activity, it did result in an observable decrease in her gait speed. The pt will benefit from further skilled PT to improve balance, gait and mobility.   OBJECTIVE IMPAIRMENTS: Abnormal gait, decreased activity tolerance, decreased balance, decreased coordination, decreased endurance, decreased knowledge of use of DME, decreased mobility, difficulty walking, decreased strength, impaired tone, improper body mechanics, postural dysfunction, and pain.   ACTIVITY  LIMITATIONS: carrying, lifting, bending, standing, squatting, stairs, transfers, dressing, and locomotion level  PARTICIPATION LIMITATIONS: meal prep, cleaning, shopping, community activity, and yard work  PERSONAL FACTORS: Fitness, Sex, Time since onset of injury/illness/exacerbation, and 3+ comorbidities: PMH consists of the following: CP, depression, GAD, migraine headache, hx of LE surgery bilateral x2 in 2008  are also affecting patient's functional outcome.   REHAB POTENTIAL: Good  CLINICAL DECISION MAKING: Evolving/moderate complexity  EVALUATION COMPLEXITY: Moderate  PLAN:  PT FREQUENCY: 1-2x/week  PT DURATION: 12 weeks  PLANNED INTERVENTIONS: Therapeutic exercises, Therapeutic activity, Neuromuscular re-education, Balance training, Gait training, Patient/Family education, Self Care, Joint mobilization, Stair training, Vestibular training, Canalith repositioning, Visual/preceptual remediation/compensation, Orthotic/Fit training, DME instructions, Dry Needling, Electrical stimulation, Wheelchair mobility training, Spinal mobilization, Cryotherapy, Moist heat, Splintting, Taping, Biofeedback, Manual therapy, and Re-evaluation  PLAN FOR NEXT SESSION: balance, obstacle clearance, stretching, strength  Baird Kay, PT 09/24/2023, 4:36 PM  4:36 PM, 09/24/23 Physical Therapist - [facility]

## 2023-09-25 ENCOUNTER — Other Ambulatory Visit: Payer: Self-pay | Admitting: Internal Medicine

## 2023-09-25 DIAGNOSIS — G801 Spastic diplegic cerebral palsy: Secondary | ICD-10-CM

## 2023-10-01 ENCOUNTER — Ambulatory Visit: Payer: MEDICAID

## 2023-10-02 ENCOUNTER — Ambulatory Visit: Payer: MEDICAID | Admitting: Cardiology

## 2023-10-02 ENCOUNTER — Encounter: Payer: Self-pay | Admitting: Cardiology

## 2023-10-02 VITALS — BP 118/82 | HR 89 | Temp 97.6°F | Ht 62.0 in | Wt 83.8 lb

## 2023-10-02 DIAGNOSIS — R051 Acute cough: Secondary | ICD-10-CM | POA: Diagnosis not present

## 2023-10-02 DIAGNOSIS — Z0130 Encounter for examination of blood pressure without abnormal findings: Secondary | ICD-10-CM

## 2023-10-02 MED ORDER — BENZONATATE 100 MG PO CAPS
100.0000 mg | ORAL_CAPSULE | Freq: Three times a day (TID) | ORAL | 1 refills | Status: DC | PRN
Start: 2023-10-02 — End: 2023-12-23

## 2023-10-02 NOTE — Progress Notes (Signed)
Established Patient Office Visit  Subjective:  Patient ID: Renee Walton, female    DOB: 1996-07-14  Age: 27 y.o. MRN: [id number]  Chief Complaint  Patient presents with   Cough    Started a week and a half ago, dry cough.    Patient in office for an acute visit, complaining of a cough. OTC Zyrtec, cough drops not helping. Non-productive cough started over a week ago, worse at night. Patient complaining of PND, causing her cough. Will do a chest xray. Recommend continuing Zyrtec, cough drops, rest and increased fluids. Will send in tessalon pearls.   Cough This is a new problem. The current episode started 1 to 4 weeks ago. The problem has been gradually worsening. The problem occurs constantly. The cough is Non-productive. Associated symptoms include postnasal drip. Pertinent negatives include no chest pain, ear congestion, ear pain, fever, headaches, myalgias, nasal congestion, rhinorrhea, sore throat or shortness of breath. The symptoms are aggravated by lying down. She has tried OTC cough suppressant and rest for the symptoms. The treatment provided no relief.    No other concerns at this time.   Past Medical History:  Diagnosis Date   Breakthrough bleeding    Cerebral palsy (HCC)    GAD (generalized anxiety disorder)    Migraine headache     Past Surgical History:  Procedure Laterality Date   LEG SURGERY     bilateral x2 in 2008    Social History   Socioeconomic History   Marital status: Single    Spouse name: Not on file   Number of children: Not on file   Years of education: Not on file   Highest education level: Not on file  Occupational History   Not on file  Tobacco Use   Smoking status: Never   Smokeless tobacco: Never  Vaping Use   Vaping status: Never Used  Substance and Sexual Activity   Alcohol use: No    Alcohol/week: 0.0 standard drinks of alcohol   Drug use: No   Sexual activity: Never  Other Topics Concern   Not on file  Social History  Narrative   ** Merged History Encounter **       Social Drivers of Corporate investment banker Strain: Not on file  Food Insecurity: Not on file  Transportation Needs: Not on file  Physical Activity: Not on file  Stress: Not on file  Social Connections: Not on file  Intimate Partner Violence: Not on file    Family History  Problem Relation Age of Onset   Stroke Other        grandparent   Hypertension Other        parent   Mental illness Other        parent   Diabetes Other        parent, grandparent    Allergies  Allergen Reactions   Cat Hair Extract    Cat Hair Extract Itching    Outpatient Medications Prior to Visit  Medication Sig   baclofen (LIORESAL) 10 MG tablet TAKE ONE TABLET BY MOUTH 3 TIMES DAILY   clotrimazole-betamethasone (LOTRISONE) cream Apply to affected area 2 times daily until the rash has resolved and then for 3 additional days.   escitalopram (LEXAPRO) 5 MG tablet Take 5 mg by mouth daily.   gabapentin (NEURONTIN) 100 MG capsule TAKE 1 CAPSULE BY MOUTH AT BEDTIME   LO LOESTRIN FE 1 MG-10 MCG / 10 MCG tablet TAKE AS DIRECTED   meloxicam (MOBIC)  15 MG tablet TAKE 1 TABLET BY MOUTH DAILY AS NEEDED   Multiple Vitamins-Minerals (CENTRUM SILVER ULTRA WOMENS PO) Take by mouth.   omeprazole (PRILOSEC) 40 MG capsule Take 40 mg by mouth daily.   ondansetron (ZOFRAN) 4 MG tablet TAKE ONE TABLET BY MOUTH EVERY 4 TO 6 HOURS AS NEEDED   senna-docusate (SENOKOT-S) 8.6-50 MG tablet Take by mouth.   SUMAtriptan (IMITREX) 50 MG tablet Take by mouth as directed.   Vitamin D, Ergocalciferol, (DRISDOL) 50000 units CAPS capsule Take 50,000 Units by mouth every 7 (seven) days.   No facility-administered medications prior to visit.    Review of Systems  Constitutional: Negative.  Negative for fever.  HENT:  Positive for postnasal drip. Negative for ear pain, rhinorrhea and sore throat.   Eyes: Negative.   Respiratory:  Positive for cough. Negative for shortness of  breath.   Cardiovascular: Negative.  Negative for chest pain.  Gastrointestinal: Negative.  Negative for abdominal pain, constipation and diarrhea.  Genitourinary: Negative.   Musculoskeletal:  Negative for joint pain and myalgias.  Skin: Negative.   Neurological: Negative.  Negative for dizziness and headaches.  Endo/Heme/Allergies: Negative.   All other systems reviewed and are negative.      Objective:   BP 118/82   Pulse 89   Temp 97.6 F (36.4 C) (Tympanic)   Ht 5\' 2"  (1.575 m)   Wt 83 lb 12.8 oz (38 kg)   SpO2 99%   BMI 15.33 kg/m   Vitals:   10/02/23 1501  BP: 118/82  Pulse: 89  Temp: 97.6 F (36.4 C)  Height: 5\' 2"  (1.575 m)  Weight: 83 lb 12.8 oz (38 kg)  SpO2: 99%  TempSrc: Tympanic  BMI (Calculated): 15.32    Physical Exam Vitals and nursing note reviewed.  Constitutional:      Appearance: Normal appearance. She is normal weight.  HENT:     Head: Normocephalic and atraumatic.     Nose: Nose normal.     Mouth/Throat:     Mouth: Mucous membranes are moist.  Eyes:     Extraocular Movements: Extraocular movements intact.     Conjunctiva/sclera: Conjunctivae normal.     Pupils: Pupils are equal, round, and reactive to light.  Cardiovascular:     Rate and Rhythm: Normal rate and regular rhythm.     Pulses: Normal pulses.     Heart sounds: Normal heart sounds.  Pulmonary:     Effort: Pulmonary effort is normal.     Breath sounds: Normal breath sounds.  Abdominal:     General: Abdomen is flat. Bowel sounds are normal.     Palpations: Abdomen is soft.  Musculoskeletal:        General: Normal range of motion.     Cervical back: Normal range of motion.  Skin:    General: Skin is warm and dry.  Neurological:     General: No focal deficit present.     Mental Status: She is alert and oriented to person, place, and time.  Psychiatric:        Mood and Affect: Mood normal.        Behavior: Behavior normal.        Thought Content: Thought content  normal.        Judgment: Judgment normal.      No results found for any visits on 10/02/23.  Recent Results (from the past 2160 hours)  CBC with Diff     Status: Abnormal   Collection Time: 08/05/23  2:15 PM  Result Value Ref Range   WBC 12.4 (H) 3.4 - 10.8 x10E3/uL   RBC 4.79 3.77 - 5.28 x10E6/uL   Hemoglobin 13.7 11.1 - 15.9 g/dL   Hematocrit 74.2 59.5 - 46.6 %   MCV 88 79 - 97 fL   MCH 28.6 26.6 - 33.0 pg   MCHC 32.6 31.5 - 35.7 g/dL   RDW 63.8 75.6 - 43.3 %   Platelets 220 150 - 450 x10E3/uL   Neutrophils 76 Not Estab. %   Lymphs 19 Not Estab. %   Monocytes 4 Not Estab. %   Eos 1 Not Estab. %   Basos 0 Not Estab. %   Neutrophils Absolute 9.4 (H) 1.4 - 7.0 x10E3/uL   Lymphocytes Absolute 2.3 0.7 - 3.1 x10E3/uL   Monocytes Absolute 0.5 0.1 - 0.9 x10E3/uL   EOS (ABSOLUTE) 0.1 0.0 - 0.4 x10E3/uL   Basophils Absolute 0.0 0.0 - 0.2 x10E3/uL   Immature Granulocytes 0 Not Estab. %   Immature Grans (Abs) 0.0 0.0 - 0.1 x10E3/uL  CMP14+EGFR     Status: Abnormal   Collection Time: 08/05/23  2:15 PM  Result Value Ref Range   Glucose 75 70 - 99 mg/dL   BUN 27 (H) 6 - 20 mg/dL   Creatinine, Ser 2.95 0.57 - 1.00 mg/dL   eGFR 188 >41 YS/AYT/0.16   BUN/Creatinine Ratio 40 (H) 9 - 23   Sodium 137 134 - 144 mmol/L   Potassium 3.8 3.5 - 5.2 mmol/L   Chloride 100 96 - 106 mmol/L   CO2 22 20 - 29 mmol/L   Calcium 9.2 8.7 - 10.2 mg/dL   Total Protein 7.5 6.0 - 8.5 g/dL   Albumin 4.6 4.0 - 5.0 g/dL   Globulin, Total 2.9 1.5 - 4.5 g/dL   Bilirubin Total 0.3 0.0 - 1.2 mg/dL   Alkaline Phosphatase 53 44 - 121 IU/L   AST 15 0 - 40 IU/L   ALT 10 0 - 32 IU/L  Vitamin D (25 hydroxy)     Status: None   Collection Time: 08/05/23  2:15 PM  Result Value Ref Range   Vit D, 25-Hydroxy 85.2 30.0 - 100.0 ng/mL    Comment: Vitamin D deficiency has been defined by the Institute of Medicine and an Endocrine Society practice guideline as a level of serum 25-OH vitamin D less than 20 ng/mL  (1,2). The Endocrine Society went on to further define vitamin D insufficiency as a level between 21 and 29 ng/mL (2). 1. IOM (Institute of Medicine). 2010. Dietary reference    intakes for calcium and D. [place]: The    [employer]. 2. Holick MF, Binkley [place], Bischoff-Ferrari HA, et al.    Evaluation, treatment, and prevention of vitamin D    deficiency: an Endocrine Society clinical practice    guideline. JCEM. 2011 Jul; 96(7):1911-30.   Culture, Urine     Status: None   Collection Time: 08/14/23  2:42 PM   Specimen: Urine   UC  Result Value Ref Range   Urine Culture, Routine Final report    Organism ID, Bacteria Comment     Comment: Mixed urogenital flora 10,000-25,000 colony forming units per mL   POCT urinalysis dipstick     Status: Abnormal   Collection Time: 08/14/23  2:58 PM  Result Value Ref Range   Color, UA     Clarity, UA     Glucose, UA Negative Negative   Bilirubin, UA Negative    Ketones,  UA Negative    Spec Grav, UA >=1.030 (A) 1.010 - 1.025   Blood, UA Negative    pH, UA 6.0 5.0 - 8.0   Protein, UA Negative Negative   Urobilinogen, UA 0.2 0.2 or 1.0 E.U./dL   Nitrite, UA Negative    Leukocytes, UA Negative Negative   Appearance     Odor        Assessment & Plan:  Zyrtec, cough drops, rest and increased fluids Tessalon pearls  Problem List Items Addressed This Visit   None Visit Diagnoses       Acute cough    -  Primary   Relevant Orders   DG Chest 2 View       Return if symptoms worsen or fail to improve.   Total time spent: 25 minutes  [employer], NP  10/02/2023   This document may have been prepared by Dragon Voice Recognition software and as such may include unintentional dictation errors.

## 2023-10-08 ENCOUNTER — Ambulatory Visit: Payer: MEDICAID

## 2023-10-08 DIAGNOSIS — R2681 Unsteadiness on feet: Secondary | ICD-10-CM

## 2023-10-08 DIAGNOSIS — M25662 Stiffness of left knee, not elsewhere classified: Secondary | ICD-10-CM

## 2023-10-08 DIAGNOSIS — M25661 Stiffness of right knee, not elsewhere classified: Secondary | ICD-10-CM

## 2023-10-08 DIAGNOSIS — R262 Difficulty in walking, not elsewhere classified: Secondary | ICD-10-CM

## 2023-10-08 DIAGNOSIS — R278 Other lack of coordination: Secondary | ICD-10-CM

## 2023-10-08 DIAGNOSIS — M6281 Muscle weakness (generalized): Secondary | ICD-10-CM

## 2023-10-08 NOTE — Therapy (Unsigned)
OUTPATIENT PHYSICAL THERAPY NEURO TREATMENT NOTE/Physical Therapy Progress Note   Dates of reporting period  05/28/2023   to   10/09/2023    Patient Name: Renee Walton MRN: [id number] DOB:10-21-1996, 27 y.o., female Today's Date: 10/09/2023   PCP: [name], MD REFERRING PROVIDER:   [name], MD    END OF SESSION:  PT End of Session - 10/09/23 0822     Visit Number 30    Number of Visits 49    Date for PT Re-Evaluation 10/22/23    Authorization Type Medicaid [place] Access    Authorization Time Period 02/25/22-06/02/22 for 12 visits    PT Start Time 1449    PT Stop Time 1529    PT Time Calculation (min) 40 min    Equipment Utilized During Treatment Gait belt    Activity Tolerance Patient tolerated treatment well    Behavior During Therapy WFL for tasks assessed/performed                       Past Medical History:  Diagnosis Date   Breakthrough bleeding    Cerebral palsy (HCC)    GAD (generalized anxiety disorder)    Migraine headache    Past Surgical History:  Procedure Laterality Date   LEG SURGERY     bilateral x2 in 2008   Patient Active Problem List   Diagnosis Date Noted   Seasonal allergic rhinitis due to pollen 03/27/2023   Bronchitis 03/13/2023   Gastroesophageal reflux disease without esophagitis 02/24/2023   Migraine without aura and without status migrainosus, not intractable 02/24/2023   GAD (generalized anxiety disorder) 02/24/2023   Loss of weight 07/17/2015   Depression 07/17/2015   Cerebral palsy (HCC) 07/17/2015    ONSET DATE: 04/18/1994 pt has CP  REFERRING DIAG:   G80.9 (ICD-10-CM) - Cerebral palsy (HCC)    THERAPY DIAG:  Difficulty in walking, not elsewhere classified  Other lack of coordination  Muscle weakness (generalized)  Unsteadiness on feet  Joint stiffness of left lower leg  Joint stiffness of right lower leg  Rationale for Evaluation and Treatment: Rehabilitation  SUBJECTIVE:                                                                                                                                                                                              SUBJECTIVE STATEMENT    Pt reports she will be gone for a month starting in January.  Two days ago pt reports mm spasm-like movements where her arms and legs lift off or teeth chatter. PT advised pt to tell her physician about these  symptoms if they continue. Pt agreeable to this plan. Pt accompanied by: self   PERTINENT HISTORY:    Pt is a pleasant 27 y/o female with CP who is returning to PT after absence and trip to [place]. She would like to improve her mobility, gait and balance. Pt has continued her PT exercises while away and worked on gait, and wants to become independent using a RW. She reports 2 recent falls, and no injuries. Pt also with stiffness in BLEs. PMH consists of the following: CP, depression, GAD, migraine headache, hx of LE surgery bilateral x2 in 2008.    PAIN: from eval Are you having pain? No  PRECAUTIONS: Fall  WEIGHT BEARING RESTRICTIONS: No  FALLS: Has patient fallen in last 6 months? Yes. Number of falls 2 when walking. She reports no injury. She got back up independently   PATIENT GOALS: Pt would like to improve her ability to ambulate with RW both inside and over outside surfaces in order to increase her independence with mobility. She would like to learn how to safely get in and out of a car with her RW.  OBJECTIVE:   Today's Treatment: 10/09/23 Gait belt donned and CGA provided for upright activities unless otherwise noted  TA: - see goal section & assessment below for details  TE: STS 15x, 10x With UE support: Alt LE FWD/BCKWD step-up/down with 6" step x multiple repetitions With UE support: LTL step-ups onto 6" step x multiple reps each side  Standing hip abduction LTL steps 2x10 each LE Squats 10x  PATIENT EDUCATION: Education details: Pt educated throughout  session about proper posture and technique with exercises. Improved exercise technique, movement at target joints, use of target muscles after min to mod verbal, visual, tactile cues.  Goals, plan  Person educated: Patient Education method: Explanation, Demonstration, and Verbal cues Education comprehension: verbalized understanding and returned demonstration  HOME EXERCISE PROGRAM See pt instruction section   GOALS: Goals reviewed with patient? Yes  SHORT TERM GOALS: Target date: 12/03/2022  Patient will be independent in home exercise program to improve strength/mobility for better functional independence with ADLs. Baseline: to be initiated next 1-2 visits; 02/19/2023: pt not consistently performing HEP; 04/23/23:  Pt reports she goes to gym every three days; 8/7: Pt reports she has been increasing her activity levels, but currently waiting for caregiver to start to be able to go to gym Goal status: IN PROGRESS  LONG TERM GOALS: Target date: 07/16/2023   Patient will increase FOTO score to equal to or greater than  76  to demonstrate statistically significant improvement in mobility and quality of life.  Baseline: risk adjusted 51; 02/19/2023: 63 * pt reporting pain in left leg limiting stair climbing; 04/23/23: 81 Goal status: MET  2.  Pt will reduce 5xsSTS time to < 8 sec to demonstrate clinically significant improvement in LE strength and balance to reduce risk of falls.  Baseline: previously 12.76 sec on 06/10/22, now 10 sec to RW on 10/22/2022; 02/19/2023 8.5 seconds hands-free; 04/23/23: 7 seconds goal status: MET  3.  Pt will improve 6 MWT to > 1000' with no AD and no LOB to demonstrate ability to improve safety with community distances. Baseline: was 822 ft back in August 2023, now 808 with RW, CGA; 02/19/2023 845 ft with 0JW; 04/23/23: 820 ft 4WW; 05/28/23:  728 ft with RW, limited by LLE pain; 07/30/2023: 878 ft with RW; 10/08/23: 518 pt somewhat distracted throughout  Goal status: IN  PROGRESS  4.  Pt will improve 10 m gait speed with LRAD to > 1.0 m/s to demonstrate reduced risk of falls along with safe ability to complete community walking tasks Baseline: previously 0.7 m/s on 06/10/2022, now 0.77 m/s with RW; 02/19/2023: 0.82 m/s with RW; 04/23/23: 0.95 m/s with RW; 05/28/23: 0.83 m/s pain limited LLE; 10/9: 1.08 m/s with RW  Goal status: MET  5.  Patient will increase Berg Balance score by > 6 points to demonstrate decreased fall risk during functional activities.  Baseline: previously 35/56 on 03/11/22(?), test deferred to next appt; 11/13/2022: 36/56; 02/19/2023: 39/56; 04/23/23: 42/56  Goal status: MET  6.  Pt will report improved stool consistency type 4 across 50% of the time and demo less flexed coccyx across 2 weeks in order to promote bowel elimination  Baseline: Pt has Type 1 stool consistency based on Bristol stool scale 100% and her mom has to put boiled water to soften the stool to flush every time.  Coccyx flexed  Goal status: NEW    ASSESSMENT:  CLINICAL IMPRESSION: Goal reassessment completed for progress visit. Pt with decrease in using RW, indicating decrease in functional capacity/gait ability. However, pt reports to PT in session that she is ambulating at home without AD and that this has improved. PT reviewed goal/reason to improve pt functional capacity/gait ability and instructed pt to focus on ambulating for distance with RW outside of PT over next month. Pt verbalized understanding. The pt will benefit from further skilled PT to improve balance, gait and mobility.   OBJECTIVE IMPAIRMENTS: Abnormal gait, decreased activity tolerance, decreased balance, decreased coordination, decreased endurance, decreased knowledge of use of DME, decreased mobility, difficulty walking, decreased strength, impaired tone, improper body mechanics, postural dysfunction, and pain.   ACTIVITY LIMITATIONS: carrying, lifting, bending, standing, squatting, stairs, transfers,  dressing, and locomotion level  PARTICIPATION LIMITATIONS: meal prep, cleaning, shopping, community activity, and yard work  PERSONAL FACTORS: Fitness, Sex, Time since onset of injury/illness/exacerbation, and 3+ comorbidities: PMH consists of the following: CP, depression, GAD, migraine headache, hx of LE surgery bilateral x2 in 2008  are also affecting patient's functional outcome.   REHAB POTENTIAL: Good  CLINICAL DECISION MAKING: Evolving/moderate complexity  EVALUATION COMPLEXITY: Moderate  PLAN:  PT FREQUENCY: 1-2x/week  PT DURATION: 12 weeks  PLANNED INTERVENTIONS: Therapeutic exercises, Therapeutic activity, Neuromuscular re-education, Balance training, Gait training, Patient/Family education, Self Care, Joint mobilization, Stair training, Vestibular training, Canalith repositioning, Visual/preceptual remediation/compensation, Orthotic/Fit training, DME instructions, Dry Needling, Electrical stimulation, Wheelchair mobility training, Spinal mobilization, Cryotherapy, Moist heat, Splintting, Taping, Biofeedback, Manual therapy, and Re-evaluation  PLAN FOR NEXT SESSION: balance, obstacle clearance, stretching, strength, endurance with RW  Baird Kay, PT 10/09/2023, 8:26 AM  8:26 AM, 10/09/23 Physical Therapist - [facility]

## 2023-10-29 ENCOUNTER — Ambulatory Visit: Payer: MEDICAID | Attending: Internal Medicine

## 2023-10-29 DIAGNOSIS — M6281 Muscle weakness (generalized): Secondary | ICD-10-CM | POA: Diagnosis present

## 2023-10-29 DIAGNOSIS — R262 Difficulty in walking, not elsewhere classified: Secondary | ICD-10-CM | POA: Insufficient documentation

## 2023-10-29 DIAGNOSIS — R2681 Unsteadiness on feet: Secondary | ICD-10-CM | POA: Diagnosis present

## 2023-10-29 DIAGNOSIS — M25661 Stiffness of right knee, not elsewhere classified: Secondary | ICD-10-CM | POA: Diagnosis present

## 2023-10-29 DIAGNOSIS — M25662 Stiffness of left knee, not elsewhere classified: Secondary | ICD-10-CM | POA: Insufficient documentation

## 2023-10-29 DIAGNOSIS — R278 Other lack of coordination: Secondary | ICD-10-CM | POA: Diagnosis present

## 2023-10-29 NOTE — Therapy (Signed)
 OUTPATIENT PHYSICAL THERAPY NEURO TREATMENT NOTE/RECERT   Patient Name: Renee Walton MRN: [id number] DOB:May 02, 1996, 28 y.o., female Today's Date: 10/29/2023   PCP: Fernand Fredy RAMAN, MD REFERRING PROVIDER:   Fernand Fredy RAMAN, MD    END OF SESSION:  PT End of Session - 10/29/23 1434     Visit Number 31    Number of Visits 43    Date for PT Re-Evaluation 01/21/24    Authorization Type Medicaid [place] Access    Authorization Time Period 02/25/22-06/02/22 for 12 visits    PT Start Time 1446    PT Stop Time 1529    PT Time Calculation (min) 43 min    Equipment Utilized During Treatment Gait belt    Activity Tolerance Patient tolerated treatment well    Behavior During Therapy WFL for tasks assessed/performed                       Past Medical History:  Diagnosis Date   Breakthrough bleeding    Cerebral palsy (HCC)    GAD (generalized anxiety disorder)    Migraine headache    Past Surgical History:  Procedure Laterality Date   LEG SURGERY     bilateral x2 in 2008   Patient Active Problem List   Diagnosis Date Noted   Seasonal allergic rhinitis due to pollen 03/27/2023   Bronchitis 03/13/2023   Gastroesophageal reflux disease without esophagitis 02/24/2023   Migraine without aura and without status migrainosus, not intractable 02/24/2023   GAD (generalized anxiety disorder) 02/24/2023   Loss of weight 07/17/2015   Depression 07/17/2015   Cerebral palsy (HCC) 07/17/2015    ONSET DATE: Oct 28, 1993 pt has CP  REFERRING DIAG:   G80.9 (ICD-10-CM) - Cerebral palsy (HCC)    THERAPY DIAG:  Joint stiffness of left lower leg  Joint stiffness of right lower leg  Other lack of coordination  Unsteadiness on feet  Muscle weakness (generalized)  Difficulty in walking, not elsewhere classified  Rationale for Evaluation and Treatment: Rehabilitation  SUBJECTIVE:                                                                                                                                                                                              SUBJECTIVE STATEMENT   Pt reports she lost her grandmother on Sunday and has not been feeling good, not been moving much.  She reports she is leaving on Jan 31st to go to [place] for a month. She will start OT when back. Pt reports she will have two home health aides starting in march. No other updates/concerns reported. Pt accompanied by:  self   PERTINENT HISTORY:    Pt is a pleasant 28 y/o female with CP who is returning to PT after absence and trip to [place]. She would like to improve her mobility, gait and balance. Pt has continued her PT exercises while away and worked on gait, and wants to become independent using a RW. She reports 2 recent falls, and no injuries. Pt also with stiffness in BLEs. PMH consists of the following: CP, depression, GAD, migraine headache, hx of LE surgery bilateral x2 in 2008.    PAIN: from eval Are you having pain? No  PRECAUTIONS: Fall  WEIGHT BEARING RESTRICTIONS: No  FALLS: Has patient fallen in last 6 months? Yes. Number of falls 2 when walking. She reports no injury. She got back up independently   PATIENT GOALS: Pt would like to improve her ability to ambulate with RW both inside and over outside surfaces in order to increase her independence with mobility. She would like to learn how to safely get in and out of a car with her RW.  OBJECTIVE:   Today's Treatment: 10/29/23 Gait belt donned and CGA provided for upright activities unless otherwise noted  TE: 5xSTS test 6.5 sec  STS 2x5 Glute bridge 4x10  Standing hip flexor stretch with UE support x 30 sec each LE LTL stepping with 4# ankle weights with UE support  (gait belt donned)  Manual: Hip adductor and hamstring stretching bilat LE/long duration stretching provided by PT on mat table  PROM hip abduction/adduction x multiple reps each LE   NMR: in // bars with gait belt donned throughout, CGA  provided throughout, pt performs multiple reps of the following interventions FWD/BCKWD gait with decreasing levels of UE support, focus on increased step-length FWD BCKWD gait with 4# ankle weights and decreasing levels of UE support, continued focus on increased step-length   PATIENT EDUCATION: Education details: Pt educated throughout session about proper posture and technique with exercises. Improved exercise technique, movement at target joints, use of target muscles after min to mod verbal, visual, tactile cues.  Goals, plan  Person educated: Patient Education method: Explanation, Demonstration, and Verbal cues Education comprehension: verbalized understanding and returned demonstration  HOME EXERCISE PROGRAM See pt instruction section   GOALS: Goals reviewed with patient? Yes  SHORT TERM GOALS: Target date: 12/10/2023    Patient will be independent in home exercise program to improve strength/mobility for better functional independence with ADLs. Baseline: to be initiated next 1-2 visits; 02/19/2023: pt not consistently performing HEP; 04/23/23:  Pt reports she goes to gym every three days; 8/7: Pt reports she has been increasing her activity levels, but currently waiting for caregiver to start to be able to go to gym Goal status: IN PROGRESS  LONG TERM GOALS: Target date: 01/21/2024     Patient will increase FOTO score to equal to or greater than  76  to demonstrate statistically significant improvement in mobility and quality of life.  Baseline: risk adjusted 51; 02/19/2023: 63 * pt reporting pain in left leg limiting stair climbing; 04/23/23: 81 Goal status: MET  2.  Pt will reduce 5xsSTS time to < 8 sec to demonstrate clinically significant improvement in LE strength and balance to reduce risk of falls.  Baseline: previously 12.76 sec on 06/10/22, now 10 sec to RW on 10/22/2022; 02/19/2023 8.5 seconds hands-free; 04/23/23: 7 seconds; 10/29/23: 6.5 sec  goal status: MET  3.  Pt will  improve 6 MWT to > 1000' with no AD and no LOB  to demonstrate ability to improve safety with community distances. Baseline: was 822 ft back in August 2023, now 808 with RW, CGA; 02/19/2023 845 ft with 5TT; 04/23/23: 820 ft 4WW; 05/28/23:  728 ft with RW, limited by LLE pain; 07/30/2023: 878 ft with RW; 10/08/23: 518 pt somewhat distracted throughout  Goal status: IN PROGRESS  4.  Pt will improve 10 m gait speed with LRAD to > 1.0 m/s to demonstrate reduced risk of falls along with safe ability to complete community walking tasks Baseline: previously 0.7 m/s on 06/10/2022, now 0.77 m/s with RW; 02/19/2023: 0.82 m/s with RW; 04/23/23: 0.95 m/s with RW; 05/28/23: 0.83 m/s pain limited LLE; 10/9: 1.08 m/s with RW  Goal status: MET  5.  Patient will increase Berg Balance score by > 6 points to demonstrate decreased fall risk during functional activities.  Baseline: previously 35/56 on 03/11/22(?), test deferred to next appt; 11/13/2022: 36/56; 02/19/2023: 39/56; 04/23/23: 42/56  Goal status: MET  6.  Pt will report improved stool consistency type 4 across 50% of the time and demo less flexed coccyx across 2 weeks in order to promote bowel elimination  Baseline: Pt has Type 1 stool consistency based on Bristol stool scale 100% and her mom has to put boiled water to soften the stool to flush every time.  Coccyx flexed  Goal status: discontinued/pt not currently being seen by pelvic PT    ASSESSMENT:  CLINICAL IMPRESSION: Pt presents for recert visit. Goal retesting performed last visit on 10/08/23, but PT did reassess 5xSTS as this was previously met and not assessed since 04/23/23. Pt with similar, but slight improvement in score since last assessment, indicating pt has maintained LE strength gains. Remainder of session largely focused on LE stretching and working on balance with gait and improving gait mechanics with focus on improving step-width. Patient's condition has the potential to improve in response to therapy.  Maximum improvement is yet to be obtained. The anticipated improvement is attainable and reasonable in a generally predictable time. The pt will benefit from further skilled PT to improve balance, gait and mobility.   OBJECTIVE IMPAIRMENTS: Abnormal gait, decreased activity tolerance, decreased balance, decreased coordination, decreased endurance, decreased knowledge of use of DME, decreased mobility, difficulty walking, decreased strength, impaired tone, improper body mechanics, postural dysfunction, and pain.   ACTIVITY LIMITATIONS: carrying, lifting, bending, standing, squatting, stairs, transfers, dressing, and locomotion level  PARTICIPATION LIMITATIONS: meal prep, cleaning, shopping, community activity, and yard work  PERSONAL FACTORS: Fitness, Sex, Time since onset of injury/illness/exacerbation, and 3+ comorbidities: PMH consists of the following: CP, depression, GAD, migraine headache, hx of LE surgery bilateral x2 in 2008  are also affecting patient's functional outcome.   REHAB POTENTIAL: Good  CLINICAL DECISION MAKING: Evolving/moderate complexity  EVALUATION COMPLEXITY: Moderate  PLAN:  PT FREQUENCY: 1-2x/week  PT DURATION: 12 weeks  PLANNED INTERVENTIONS: Therapeutic exercises, Therapeutic activity, Neuromuscular re-education, Balance training, Gait training, Patient/Family education, Self Care, Joint mobilization, Stair training, Vestibular training, Canalith repositioning, Visual/preceptual remediation/compensation, Orthotic/Fit training, DME instructions, Dry Needling, Electrical stimulation, Wheelchair mobility training, Spinal mobilization, Cryotherapy, Moist heat, Splintting, Taping, Biofeedback, Manual therapy, and Re-evaluation  PLAN FOR NEXT SESSION: gait mechanics, improve step-width, strengthening BLE, hip abduction, stretching  Darryle JONELLE Patten, PT 10/29/2023, 3:45 PM  3:45 PM, 10/29/23 Physical Therapist - [facility]

## 2023-11-04 ENCOUNTER — Encounter: Payer: Self-pay | Admitting: Internal Medicine

## 2023-11-04 ENCOUNTER — Ambulatory Visit: Payer: MEDICAID | Admitting: Internal Medicine

## 2023-11-04 VITALS — BP 120/80 | HR 78 | Ht 62.0 in | Wt 88.4 lb

## 2023-11-04 DIAGNOSIS — G43009 Migraine without aura, not intractable, without status migrainosus: Secondary | ICD-10-CM | POA: Diagnosis not present

## 2023-11-04 DIAGNOSIS — G801 Spastic diplegic cerebral palsy: Secondary | ICD-10-CM

## 2023-11-04 DIAGNOSIS — F411 Generalized anxiety disorder: Secondary | ICD-10-CM | POA: Diagnosis not present

## 2023-11-04 NOTE — Progress Notes (Signed)
 Established Patient Office Visit  Subjective:  Patient ID: Renee Walton, female    DOB: 1996/10/12  Age: 28 y.o. MRN: [id number]  Chief Complaint  Patient presents with   Follow-up    Follow up    Patient comes in for follow-up today.  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  Notices an improvement in her gait.  She was also treated recently for tenia pedis, and her rash has improved.  Needs to continue using the antifungal cream for some more days. Requests referral for occupational therapy.    No other concerns at this time.   Past Medical History:  Diagnosis Date   Breakthrough bleeding    Cerebral palsy (HCC)    GAD (generalized anxiety disorder)    Migraine headache     Past Surgical History:  Procedure Laterality Date   LEG SURGERY     bilateral x2 in 2008    Social History   Socioeconomic History   Marital status: Single    Spouse name: Not on file   Number of children: Not on file   Years of education: Not on file   Highest education level: Not on file  Occupational History   Not on file  Tobacco Use   Smoking status: Never   Smokeless tobacco: Never  Vaping Use   Vaping status: Never Used  Substance and Sexual Activity   Alcohol use: No    Alcohol/week: 0.0 standard drinks of alcohol   Drug use: No   Sexual activity: Never  Other Topics Concern   Not on file  Social History Narrative   ** Merged History Encounter **       Social Drivers of Corporate Investment Banker Strain: Not on file  Food Insecurity: Not on file  Transportation Needs: Not on file  Physical Activity: Not on file  Stress: Not on file  Social Connections: Not on file  Intimate Partner Violence: Not on file    Family History  Problem Relation Age of Onset   Stroke Other        grandparent   Hypertension Other        parent   Mental illness Other        parent   Diabetes Other        parent, grandparent    Allergies  Allergen Reactions    Cat Hair Extract    Cat Hair Extract Itching    Outpatient Medications Prior to Visit  Medication Sig   baclofen (LIORESAL) 10 MG tablet TAKE ONE TABLET BY MOUTH 3 TIMES DAILY   escitalopram  (LEXAPRO ) 5 MG tablet Take 5 mg by mouth daily.   gabapentin  (NEURONTIN ) 100 MG capsule TAKE 1 CAPSULE BY MOUTH AT BEDTIME   LO LOESTRIN FE 1 MG-10 MCG / 10 MCG tablet TAKE AS DIRECTED   meloxicam (MOBIC) 15 MG tablet TAKE 1 TABLET BY MOUTH DAILY AS NEEDED   Multiple Vitamins-Minerals (CENTRUM SILVER ULTRA WOMENS PO) Take by mouth.   omeprazole  (PRILOSEC) 40 MG capsule Take 40 mg by mouth daily.   ondansetron  (ZOFRAN ) 4 MG tablet TAKE ONE TABLET BY MOUTH EVERY 4 TO 6 HOURS AS NEEDED   senna-docusate (SENOKOT-S) 8.6-50 MG tablet Take by mouth.   SUMAtriptan (IMITREX) 50 MG tablet Take by mouth as directed.   Vitamin D , Ergocalciferol , (DRISDOL ) 50000 units CAPS capsule Take 50,000 Units by mouth every 7 (seven) days.   benzonatate  (TESSALON  PERLES) 100 MG capsule Take 1 capsule (100 mg total)  by mouth 3 (three) times daily as needed for cough. (Patient not taking: Reported on 11/04/2023)   clotrimazole -betamethasone  (LOTRISONE ) cream Apply to affected area 2 times daily until the rash has resolved and then for 3 additional days. (Patient not taking: Reported on 11/04/2023)   No facility-administered medications prior to visit.    Review of Systems  Constitutional: Negative.  Negative for chills, diaphoresis, fever, malaise/fatigue and weight loss.  HENT: Negative.    Eyes: Negative.   Respiratory: Negative.  Negative for cough and shortness of breath.   Cardiovascular: Negative.  Negative for chest pain, palpitations and leg swelling.  Gastrointestinal: Negative.  Negative for abdominal pain, constipation, diarrhea, heartburn, nausea and vomiting.  Genitourinary: Negative.  Negative for dysuria and flank pain.  Musculoskeletal:  Positive for myalgias.  Skin: Negative.   Neurological:  Positive for  weakness. Negative for dizziness and headaches.  Endo/Heme/Allergies: Negative.   Psychiatric/Behavioral: Negative.  Negative for depression and suicidal ideas. The patient is not nervous/anxious.        Objective:   BP 120/80   Pulse 78   Ht 5' 2 (1.575 m)   Wt 88 lb 6.4 oz (40.1 kg)   SpO2 100%   BMI 16.17 kg/m   Vitals:   11/04/23 1301  BP: 120/80  Pulse: 78  Height: 5' 2 (1.575 m)  Weight: 88 lb 6.4 oz (40.1 kg)  SpO2: 100%  BMI (Calculated): 16.16    Physical Exam Vitals and nursing note reviewed.  Constitutional:      Appearance: Normal appearance.  HENT:     Head: Normocephalic and atraumatic.     Nose: Nose normal.     Mouth/Throat:     Mouth: Mucous membranes are moist.     Pharynx: Oropharynx is clear.  Eyes:     Conjunctiva/sclera: Conjunctivae normal.     Pupils: Pupils are equal, round, and reactive to light.  Cardiovascular:     Rate and Rhythm: Normal rate and regular rhythm.     Pulses: Normal pulses.     Heart sounds: Normal heart sounds. No murmur heard. Pulmonary:     Effort: Pulmonary effort is normal.     Breath sounds: Normal breath sounds. No wheezing.  Abdominal:     General: Bowel sounds are normal.     Palpations: Abdomen is soft.     Tenderness: There is no abdominal tenderness. There is no right CVA tenderness or left CVA tenderness.  Musculoskeletal:        General: Normal range of motion.     Cervical back: Normal range of motion.     Right lower leg: No edema.     Left lower leg: No edema.  Skin:    General: Skin is warm and dry.  Neurological:     General: No focal deficit present.     Mental Status: She is alert and oriented to person, place, and time.     Gait: Gait abnormal.     Deep Tendon Reflexes: Reflexes abnormal.  Psychiatric:        Mood and Affect: Mood normal.        Behavior: Behavior normal.      No results found for any visits on 11/04/23.  Recent Results (from the past 2160 hours)  Culture,  Urine     Status: None   Collection Time: 08/14/23  2:42 PM   Specimen: Urine   UC  Result Value Ref Range   Urine Culture, Routine Final report  Organism ID, Bacteria Comment     Comment: Mixed urogenital flora 10,000-25,000 colony forming units per mL   POCT urinalysis dipstick     Status: Abnormal   Collection Time: 08/14/23  2:58 PM  Result Value Ref Range   Color, UA     Clarity, UA     Glucose, UA Negative Negative   Bilirubin, UA Negative    Ketones, UA Negative    Spec Grav, UA >=1.030 (A) 1.010 - 1.025   Blood, UA Negative    pH, UA 6.0 5.0 - 8.0   Protein, UA Negative Negative   Urobilinogen, UA 0.2 0.2 or 1.0 E.U./dL   Nitrite, UA Negative    Leukocytes, UA Negative Negative   Appearance     Odor        Assessment & Plan:  Patient will continue all her medications. Send referral for occupational therapy. Problem List Items Addressed This Visit     Cerebral palsy (HCC) - Primary   Relevant Orders   Ambulatory referral to Occupational Therapy   Migraine without aura and without status migrainosus, not intractable   GAD (generalized anxiety disorder)    Follow-up as scheduled.  Total time spent: 25 minutes  FERNAND FREDY RAMAN, MD  11/04/2023   This document may have been prepared by [facility] Voice Recognition software and as such may include unintentional dictation errors.

## 2023-11-05 ENCOUNTER — Ambulatory Visit: Payer: MEDICAID

## 2023-11-05 DIAGNOSIS — M6281 Muscle weakness (generalized): Secondary | ICD-10-CM

## 2023-11-05 DIAGNOSIS — M25662 Stiffness of left knee, not elsewhere classified: Secondary | ICD-10-CM | POA: Diagnosis not present

## 2023-11-05 DIAGNOSIS — M25661 Stiffness of right knee, not elsewhere classified: Secondary | ICD-10-CM

## 2023-11-05 DIAGNOSIS — R2681 Unsteadiness on feet: Secondary | ICD-10-CM

## 2023-11-05 DIAGNOSIS — R262 Difficulty in walking, not elsewhere classified: Secondary | ICD-10-CM

## 2023-11-05 DIAGNOSIS — R278 Other lack of coordination: Secondary | ICD-10-CM

## 2023-11-05 NOTE — Therapy (Signed)
 OUTPATIENT PHYSICAL THERAPY NEURO TREATMENT NOTE   Patient Name: Renee Walton MRN: [id number] DOB:03/24/1996, 28 y.o., female Today's Date: 11/05/2023   PCP: Aisha Hove, MD REFERRING PROVIDER:   Aisha Hove, MD    END OF SESSION:              Past Medical History:  Diagnosis Date   Breakthrough bleeding    Cerebral palsy (HCC)    GAD (generalized anxiety disorder)    Migraine headache    Past Surgical History:  Procedure Laterality Date   LEG SURGERY     bilateral x2 in 2008   Patient Active Problem List   Diagnosis Date Noted   Seasonal allergic rhinitis due to pollen 03/27/2023   Bronchitis 03/13/2023   Gastroesophageal reflux disease without esophagitis 02/24/2023   Migraine without aura and without status migrainosus, not intractable 02/24/2023   GAD (generalized anxiety disorder) 02/24/2023   Loss of weight 07/17/2015   Depression 07/17/2015   Cerebral palsy (HCC) 07/17/2015    ONSET DATE: 11/04/1993 pt has CP  REFERRING DIAG:   G80.9 (ICD-10-CM) - Cerebral palsy (HCC)    THERAPY DIAG:  No diagnosis found.  Rationale for Evaluation and Treatment: Rehabilitation  SUBJECTIVE:                                                                                                                                                                                             SUBJECTIVE STATEMENT   Pt received botox injection in BLE, reports no pain, no stumbles/falls, no current concerns. She says when ambulating without an AD "it's hard for me to stop myself." Pt accompanied by: self   PERTINENT HISTORY:    Pt is a pleasant 28 y/o female with CP who is returning to PT after absence and trip to [place]. She would like to improve her mobility, gait and balance. Pt has continued her PT exercises while away and worked on gait, and wants to become independent using a RW. She reports 2 recent falls, and no injuries. Pt also with stiffness in BLEs. PMH consists  of the following: CP, depression, GAD, migraine headache, hx of LE surgery bilateral x2 in 2008.    PAIN: from eval Are you having pain? No  PRECAUTIONS: Fall  WEIGHT BEARING RESTRICTIONS: No  FALLS: Has patient fallen in last 6 months? Yes. Number of falls 2 when walking. She reports no injury. She got back up independently   PATIENT GOALS: Pt would like to improve her ability to ambulate with RW both inside and over outside surfaces in order to increase her independence with mobility. She would like  to learn how to safely get in and out of a car with her RW.  OBJECTIVE:   Today's Treatment: 11/05/23 Gait belt donned and CGA provided for upright activities unless otherwise noted   NMR: in // bars with gait belt donned throughout, CGA provided throughout, pt performs multiple reps of the following interventions Gait with RW, focus on upright posture, dual tasking x 296 ft, additional 148 ft after rest break. Pt notes difficulty with maintaining gait mechanics and focusing on additional taks Standing balance at RW, cuing for upright posture/scapular squeeze 2x 60 sec  Gait without AD, focus on upright posture to prevent fwd lean/acceleration x 148 ft with close CGA   MANUAL: Assessment of RLE as pt reports chronic discomfort in toes with gait- R second toe noted in increased flexion (prox interphalangeal joint), slightly flexible - pt reports improvement in discomfort with PT assessing flexibility of 2nd toe   PROM: on plinth Hip adductor and hamstring stretching bilat LE/long duration stretching provided by PT PROM hip abduction/adduction x multiple reps each LE    PATIENT EDUCATION: Education details: Pt educated throughout session about proper posture and technique with exercises. Improved exercise technique, movement at target joints, use of target muscles after min to mod verbal, visual, tactile cues.  Goals, plan  Person educated: Patient Education method: Explanation,  Demonstration, and Verbal cues Education comprehension: verbalized understanding and returned demonstration  HOME EXERCISE PROGRAM See pt instruction section   GOALS: Goals reviewed with patient? Yes  SHORT TERM GOALS: Target date: 12/10/2023    Patient will be independent in home exercise program to improve strength/mobility for better functional independence with ADLs. Baseline: to be initiated next 1-2 visits; 02/19/2023: pt not consistently performing HEP; 04/23/23:  Pt reports she goes to gym every three days; 8/7: Pt reports she has been increasing her activity levels, but currently waiting for caregiver to start to be able to go to gym Goal status: IN PROGRESS  LONG TERM GOALS: Target date: 01/21/2024     Patient will increase FOTO score to equal to or greater than  76  to demonstrate statistically significant improvement in mobility and quality of life.  Baseline: risk adjusted 51; 02/19/2023: 63 * pt reporting pain in left leg limiting stair climbing; 04/23/23: 81 Goal status: MET  2.  Pt will reduce 5xsSTS time to < 8 sec to demonstrate clinically significant improvement in LE strength and balance to reduce risk of falls.  Baseline: previously 12.76 sec on 06/10/22, now 10 sec to RW on 10/22/2022; 02/19/2023 8.5 seconds hands-free; 04/23/23: 7 seconds; 10/29/23: 6.5 sec  goal status: MET  3.  Pt will improve 6 MWT to > 1000' with no AD and no LOB to demonstrate ability to improve safety with community distances. Baseline: was 822 ft back in August 2023, now 808 with RW, CGA; 02/19/2023 845 ft with 7QI; 04/23/23: 820 ft 4WW; 05/28/23:  728 ft with RW, limited by LLE pain; 07/30/2023: 878 ft with RW; 10/08/23: 518 pt somewhat distracted throughout  Goal status: IN PROGRESS  4.  Pt will improve 10 m gait speed with LRAD to > 1.0 m/s to demonstrate reduced risk of falls along with safe ability to complete community walking tasks Baseline: previously 0.7 m/s on 06/10/2022, now 0.77 m/s with RW; 02/19/2023:  0.82 m/s with RW; 04/23/23: 0.95 m/s with RW; 05/28/23: 0.83 m/s pain limited LLE; 10/9: 1.08 m/s with RW  Goal status: MET  5.  Patient will increase Berg Balance score by >  6 points to demonstrate decreased fall risk during functional activities.  Baseline: previously 35/56 on 03/11/22(?), test deferred to next appt; 11/13/2022: 36/56; 02/19/2023: 39/56; 04/23/23: 42/56  Goal status: MET  6.  Pt will report improved stool consistency type 4 across 50% of the time and demo less flexed coccyx across 2 weeks in order to promote bowel elimination  Baseline: Pt has Type 1 stool consistency based on Bristol stool scale 100% and her mom has to put boiled water to soften the stool to flush every time.  Coccyx flexed  Goal status: discontinued/pt not currently being seen by pelvic PT    ASSESSMENT:  CLINICAL IMPRESSION: Part of session today focused on addressing upright posture with gait to reduce likelihood of LOB anteriorly. Pt able to demo correction with cuing in session, but does have trouble sustaining this with dual cog task. PT additionally assessed R 2nd toe as pt reports chronic discomfort with gait. 2nd toe in increased flexion, but appears to be slightly flexible. Will address future visits. The pt will benefit from further skilled PT to improve balance, gait and mobility.   OBJECTIVE IMPAIRMENTS: Abnormal gait, decreased activity tolerance, decreased balance, decreased coordination, decreased endurance, decreased knowledge of use of DME, decreased mobility, difficulty walking, decreased strength, impaired tone, improper body mechanics, postural dysfunction, and pain.   ACTIVITY LIMITATIONS: carrying, lifting, bending, standing, squatting, stairs, transfers, dressing, and locomotion level  PARTICIPATION LIMITATIONS: meal prep, cleaning, shopping, community activity, and yard work  PERSONAL FACTORS: Fitness, Sex, Time since onset of injury/illness/exacerbation, and 3+ comorbidities: PMH consists  of the following: CP, depression, GAD, migraine headache, hx of LE surgery bilateral x2 in 2008  are also affecting patient's functional outcome.   REHAB POTENTIAL: Good  CLINICAL DECISION MAKING: Evolving/moderate complexity  EVALUATION COMPLEXITY: Moderate  PLAN:  PT FREQUENCY: 1-2x/week  PT DURATION: 12 weeks  PLANNED INTERVENTIONS: Therapeutic exercises, Therapeutic activity, Neuromuscular re-education, Balance training, Gait training, Patient/Family education, Self Care, Joint mobilization, Stair training, Vestibular training, Canalith repositioning, Visual/preceptual remediation/compensation, Orthotic/Fit training, DME instructions, Dry Needling, Electrical stimulation, Wheelchair mobility training, Spinal mobilization, Cryotherapy, Moist heat, Splintting, Taping, Biofeedback, Manual therapy, and Re-evaluation  PLAN FOR NEXT SESSION: gait mechanics, improve step-width, strengthening BLE, hip abduction, stretching, manual RLE  Samie Crews, PT 11/05/2023, 2:47 PM  2:47 PM, 11/05/23 Physical Therapist - [facility]

## 2023-11-12 ENCOUNTER — Ambulatory Visit: Payer: MEDICAID

## 2023-11-17 ENCOUNTER — Other Ambulatory Visit: Payer: Self-pay | Admitting: Internal Medicine

## 2023-11-18 ENCOUNTER — Other Ambulatory Visit: Payer: Self-pay | Admitting: Internal Medicine

## 2023-11-18 MED ORDER — VITAMIN D (ERGOCALCIFEROL) 1.25 MG (50000 UNIT) PO CAPS: 50000.0000 [IU] | ORAL_CAPSULE | ORAL | 1 refills | Status: DC

## 2023-11-18 MED ORDER — OMEPRAZOLE 40 MG PO CPDR: 40.0000 mg | DELAYED_RELEASE_CAPSULE | Freq: Every day | ORAL | 1 refills | Status: DC

## 2023-11-19 ENCOUNTER — Ambulatory Visit: Payer: MEDICAID

## 2023-11-26 ENCOUNTER — Ambulatory Visit: Payer: MEDICAID

## 2023-12-03 ENCOUNTER — Ambulatory Visit: Payer: MEDICAID

## 2023-12-03 ENCOUNTER — Encounter: Payer: MEDICAID | Admitting: Occupational Therapy

## 2023-12-10 ENCOUNTER — Ambulatory Visit: Payer: MEDICAID

## 2023-12-10 ENCOUNTER — Encounter: Payer: MEDICAID | Admitting: Occupational Therapy

## 2023-12-17 ENCOUNTER — Ambulatory Visit: Payer: MEDICAID

## 2023-12-23 ENCOUNTER — Telehealth: Payer: Self-pay | Admitting: Internal Medicine

## 2023-12-23 ENCOUNTER — Other Ambulatory Visit: Payer: Self-pay | Admitting: Cardiology

## 2023-12-23 NOTE — Telephone Encounter (Signed)
 Patient left VM, did not state what she needs.. Just left name and phone number.

## 2023-12-24 ENCOUNTER — Ambulatory Visit: Payer: MEDICAID

## 2023-12-24 ENCOUNTER — Ambulatory Visit: Payer: MEDICAID | Admitting: Occupational Therapy

## 2023-12-24 ENCOUNTER — Ambulatory Visit: Payer: MEDICAID | Admitting: Family

## 2023-12-25 ENCOUNTER — Ambulatory Visit: Payer: MEDICAID | Admitting: Cardiology

## 2023-12-25 NOTE — Telephone Encounter (Signed)
 Patient has appt today.

## 2023-12-31 ENCOUNTER — Ambulatory Visit: Payer: MEDICAID | Attending: Internal Medicine

## 2023-12-31 ENCOUNTER — Ambulatory Visit: Payer: MEDICAID | Admitting: Occupational Therapy

## 2023-12-31 DIAGNOSIS — M25661 Stiffness of right knee, not elsewhere classified: Secondary | ICD-10-CM | POA: Insufficient documentation

## 2023-12-31 DIAGNOSIS — R278 Other lack of coordination: Secondary | ICD-10-CM

## 2023-12-31 DIAGNOSIS — R262 Difficulty in walking, not elsewhere classified: Secondary | ICD-10-CM | POA: Insufficient documentation

## 2023-12-31 DIAGNOSIS — M6281 Muscle weakness (generalized): Secondary | ICD-10-CM | POA: Diagnosis present

## 2023-12-31 DIAGNOSIS — M25662 Stiffness of left knee, not elsewhere classified: Secondary | ICD-10-CM | POA: Diagnosis present

## 2023-12-31 DIAGNOSIS — M79605 Pain in left leg: Secondary | ICD-10-CM | POA: Diagnosis present

## 2023-12-31 NOTE — Therapy (Signed)
 OUTPATIENT PHYSICAL THERAPY NEURO TREATMENT NOTE   Patient Name: Renee Walton MRN: [id number] DOB:11/12/1995, 28 y.o., female Today's Date: 12/31/2023   PCP: [name], MD REFERRING PROVIDER:   [name], MD    END OF SESSION:              Past Medical History:  Diagnosis Date   Breakthrough bleeding    Cerebral palsy (HCC)    GAD (generalized anxiety disorder)    Migraine headache    Past Surgical History:  Procedure Laterality Date   LEG SURGERY     bilateral x2 in 2008   Patient Active Problem List   Diagnosis Date Noted   Seasonal allergic rhinitis due to pollen 03/27/2023   Bronchitis 03/13/2023   Gastroesophageal reflux disease without esophagitis 02/24/2023   Migraine without aura and without status migrainosus, not intractable 02/24/2023   GAD (generalized anxiety disorder) 02/24/2023   Loss of weight 07/17/2015   Depression 07/17/2015   Cerebral palsy (HCC) 07/17/2015    ONSET DATE: 09-Feb-1996 pt has CP  REFERRING DIAG:   G80.9 (ICD-10-CM) - Cerebral palsy (HCC)    THERAPY DIAG:  No diagnosis found.  Rationale for Evaluation and Treatment: Rehabilitation  SUBJECTIVE:                                                                                                                                                                                             SUBJECTIVE STATEMENT   Pt returns from vacation. Last seen 11/05/2023. Pt with some L hip pain felt down to LLE, feels very tight into LLE. Pt reports one fall over her brother's shoes. Has been doing well otherwise.  Pt accompanied by: self  PERTINENT HISTORY:    Pt is a pleasant 28 y/o female with CP who is returning to PT after absence and trip to [place]. She would like to improve her mobility, gait and balance. Pt has continued her PT exercises while away and worked on gait, and wants to become independent using a RW. She reports 2 recent falls, and no injuries. Pt also with  stiffness in BLEs. PMH consists of the following: CP, depression, GAD, migraine headache, hx of LE surgery bilateral x2 in 2008.    PAIN: from eval Are you having pain? No  PRECAUTIONS: Fall  WEIGHT BEARING RESTRICTIONS: No  FALLS: Has patient fallen in last 6 months? Yes. Number of falls 2 when walking. She reports no injury. She got back up independently   PATIENT GOALS: Pt would like to improve her ability to ambulate with RW both inside and over outside surfaces in order to increase  her independence with mobility. She would like to learn how to safely get in and out of a car with her RW.  OBJECTIVE:   Today's Treatment: 12/31/23 Gait belt donned and CGA provided for upright activities unless otherwise noted   Physical Performance: - 935 ft no AD. Completed to assess endurance and functional capacity since pt has not been since in about two months. She shows overall improvement and able to complete without AD. PT reviewed results/indications with pt.  Manual: Several minutes of the following PROM hip abduction/adduction x multiple reps each LE PROM knee to chest 2x10 each LE Long duration hamstring stretch x 2 rounds each LE - observed improvement within session, starts out very limited with range Adductor stretch x 2 rounds each LE    PATIENT EDUCATION: Education details: Pt educated throughout session about proper posture and technique with exercises. Improved exercise technique, movement at target joints, use of target muscles after min to mod verbal, visual, tactile cues.  Goals, progress  Person educated: Patient Education method: Explanation, Demonstration, and Verbal cues Education comprehension: verbalized understanding and returned demonstration  HOME EXERCISE PROGRAM See pt instruction section   GOALS: Goals reviewed with patient? Yes  SHORT TERM GOALS: Target date: 12/10/2023    Patient will be independent in home exercise program to improve  strength/mobility for better functional independence with ADLs. Baseline: to be initiated next 1-2 visits; 02/19/2023: pt not consistently performing HEP; 04/23/23:  Pt reports she goes to gym every three days; 8/7: Pt reports she has been increasing her activity levels, but currently waiting for caregiver to start to be able to go to gym Goal status: IN PROGRESS  LONG TERM GOALS: Target date: 01/21/2024     Patient will increase FOTO score to equal to or greater than  76  to demonstrate statistically significant improvement in mobility and quality of life.  Baseline: risk adjusted 51; 02/19/2023: 63 * pt reporting pain in left leg limiting stair climbing; 04/23/23: 81 Goal status: MET/DISCONTINUED  2.  Pt will reduce 5xsSTS time to < 8 sec to demonstrate clinically significant improvement in LE strength and balance to reduce risk of falls.  Baseline: previously 12.76 sec on 06/10/22, now 10 sec to RW on 10/22/2022; 02/19/2023 8.5 seconds hands-free; 04/23/23: 7 seconds; 10/29/23: 6.5 sec  goal status: MET  3.  Pt will improve 6 MWT to > 1000' with no AD and no LOB to demonstrate ability to improve safety with community distances. Baseline: was 822 ft back in August 2023, now 808 with RW, CGA; 02/19/2023 845 ft with 4UJ; 04/23/23: 820 ft 4WW; 05/28/23:  728 ft with RW, limited by LLE pain; 07/30/2023: 878 ft with RW; 10/08/23: 518 pt somewhat distracted throughout; 935 ft with no AD, CGA  Goal status: IN PROGRESS  4.  Pt will improve 10 m gait speed with LRAD to > 1.0 m/s to demonstrate reduced risk of falls along with safe ability to complete community walking tasks Baseline: previously 0.7 m/s on 06/10/2022, now 0.77 m/s with RW; 02/19/2023: 0.82 m/s with RW; 04/23/23: 0.95 m/s with RW; 05/28/23: 0.83 m/s pain limited LLE; 10/9: 1.08 m/s with RW  Goal status: MET  5.  Patient will increase Berg Balance score by > 6 points to demonstrate decreased fall risk during functional activities.  Baseline: previously 35/56 on  03/11/22(?), test deferred to next appt; 11/13/2022: 36/56; 02/19/2023: 39/56; 04/23/23: 42/56  Goal status: MET  6.  Pt will report improved stool consistency type 4  across 50% of the time and demo less flexed coccyx across 2 weeks in order to promote bowel elimination  Baseline: Pt has Type 1 stool consistency based on Bristol stool scale 100% and her mom has to put boiled water to soften the stool to flush every time.  Coccyx flexed  Goal status: discontinued/pt not currently being seen by pelvic PT    ASSESSMENT:  CLINICAL IMPRESSION: Pt returns to PT after about 2 month absence due to vacation/being out of country. reassessed and pt exhibits overall improvement and able to complete without AD but with CGA. PT did observe increase in bilat hamstring stiffness since pt last seen, but this did improve within session following PROM stretching. The pt will benefit from further skilled PT to improve balance, gait and mobility.   OBJECTIVE IMPAIRMENTS: Abnormal gait, decreased activity tolerance, decreased balance, decreased coordination, decreased endurance, decreased knowledge of use of DME, decreased mobility, difficulty walking, decreased strength, impaired tone, improper body mechanics, postural dysfunction, and pain.   ACTIVITY LIMITATIONS: carrying, lifting, bending, standing, squatting, stairs, transfers, dressing, and locomotion level  PARTICIPATION LIMITATIONS: meal prep, cleaning, shopping, community activity, and yard work  PERSONAL FACTORS: Fitness, Sex, Time since onset of injury/illness/exacerbation, and 3+ comorbidities: PMH consists of the following: CP, depression, GAD, migraine headache, hx of LE surgery bilateral x2 in 2008  are also affecting patient's functional outcome.   REHAB POTENTIAL: Good  CLINICAL DECISION MAKING: Evolving/moderate complexity  EVALUATION COMPLEXITY: Moderate  PLAN:  PT FREQUENCY: 1-2x/week  PT DURATION: 12 weeks  PLANNED INTERVENTIONS:  Therapeutic exercises, Therapeutic activity, Neuromuscular re-education, Balance training, Gait training, Patient/Family education, Self Care, Joint mobilization, Stair training, Vestibular training, Canalith repositioning, Visual/preceptual remediation/compensation, Orthotic/Fit training, DME instructions, Dry Needling, Electrical stimulation, Wheelchair mobility training, Spinal mobilization, Cryotherapy, Moist heat, Splintting, Taping, Biofeedback, Manual therapy, and Re-evaluation  PLAN FOR NEXT SESSION: gait mechanics, improve step-width, strengthening BLE, hip abduction, stretching, manual RLE  Baird Kay, PT 12/31/2023, 5:17 PM  5:17 PM, 12/31/23 Physical Therapist - [facility]

## 2023-12-31 NOTE — Therapy (Signed)
 OUTPATIENT OCCUPATIONAL THERAPY NEURO EVALUATION  Patient Name: Renee Walton MRN: [id number] DOB:01/14/1996, 28 y.o., female Today's Date: 12/31/2023   REFERRING PROVIDER: Yves Dill, MD  END OF SESSION:  OT End of Session - 12/31/23 1656     Visit Number 1    Number of Visits 12    Date for OT Re-Evaluation 03/24/24    OT Start Time 1445    OT Stop Time 1535    OT Time Calculation (min) 50 min    Activity Tolerance Patient tolerated treatment well    Behavior During Therapy WFL for tasks assessed/performed             Past Medical History:  Diagnosis Date   Breakthrough bleeding    Cerebral palsy (HCC)    GAD (generalized anxiety disorder)    Migraine headache    Past Surgical History:  Procedure Laterality Date   LEG SURGERY     bilateral x2 in 2008   Patient Active Problem List   Diagnosis Date Noted   Seasonal allergic rhinitis due to pollen 03/27/2023   Bronchitis 03/13/2023   Gastroesophageal reflux disease without esophagitis 02/24/2023   Migraine without aura and without status migrainosus, not intractable 02/24/2023   GAD (generalized anxiety disorder) 02/24/2023   Loss of weight 07/17/2015   Depression 07/17/2015   Cerebral palsy (HCC) 07/17/2015    ONSET DATE: 1995  REFERRING DIAG: CP  THERAPY DIAG:  Muscle weakness (generalized)  Other lack of coordination  Rationale for Evaluation and Treatment: Rehabilitation  SUBJECTIVE:   SUBJECTIVE STATEMENT: Pt. Reports that she would like to be able to put her hair up, so that she doesn't have to have her mother do it for her. Pt accompanied by: self  PERTINENT HISTORY: Pt. Is a 28 y.o. female with a diagnosis of CP. PMHx includes: GAD, Migraine headache,GERD, bilateral LE surgery x's 2 in 2008.  PRECAUTIONS: None  WEIGHT BEARING RESTRICTIONS: No  PAIN:  Are you having pain? 1/10 in the right dorsal aspect of the hand along the ulnar aspect of the hand radiating up to the  axilla.  FALLS: Has patient fallen in last 6 months? No  LIVING ENVIRONMENT: Lives with: lives with their family Multiple family members Lives in: House/apartment Stairs: 2 floors-stays on the main floor,  Has following equipment at home: Dan Humphreys - 2 wheeled, Wheelchair (power), and Grab bars  PLOF:  Has a Nurse, adult.  PATIENT GOALS: To be able to tie her hair back, or try a new hairdo.  OBJECTIVE:  Note: Objective measures were completed at Evaluation unless otherwise noted.  HAND DOMINANCE: Left  ADLs:  Transfers/ambulation related to ADLs: Eating: Independent Grooming: Independent; assist required for hair care/ponytail UB Dressing: Independent LB Dressing: Independent, difficulty fastening pant button, soc Toileting: Mother assists  Bathing: Independent with washing, assist with drying. 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 Is working with PT  POSTURE COMMENTS:   Sitting balance: good supported  ACTIVITY TOLERANCE: Activity tolerance: Fair  FUNCTIONAL OUTCOME MEASURES: TBD  UPPER EXTREMITY ROM:    Active ROM Right Eval WFL Left Eval [facility]  Shoulder flexion    Shoulder abduction    Shoulder adduction    Shoulder  extension    Shoulder internal rotation    Shoulder external rotation    Elbow flexion    Elbow extension    Wrist flexion    Wrist extension    Wrist ulnar deviation    Wrist radial deviation    Wrist pronation    Wrist supination    (Blank rows = not tested)  UPPER EXTREMITY MMT:     MMT Right Eval WFL Left Eval [facility]  Shoulder flexion    Shoulder abduction    Shoulder adduction     Shoulder extension    Shoulder internal rotation    Shoulder external rotation    Middle trapezius    Lower trapezius    Elbow flexion    Elbow extension    Wrist flexion    Wrist extension    Wrist ulnar deviation    Wrist radial deviation    Wrist pronation    Wrist supination    (Blank rows = not tested)  HAND FUNCTION: Grip strength: Right: 13 lbs; Left: 18 lbs, Lateral pinch: Right: 3 lbs, Left: 8 lbs, and 3 point pinch: Right: 8 lbs, Left: 8 lbs  COORDINATION: 9 Hole Peg test: Right: 1 min. & 17 sec; Left: 37 sec  SENSATION: WFL light touch, and proprioceptive awareness  COGNITION: Overall cognitive status: Within functional limits for tasks assessed  VISION: Subjective report: No change form baseline   PERCEPTION: WFL  PRAXIS: TBD                                                                                                                            TREATMENT DATE: 12/31/2023   Initial evaluation was completed. Education was provided as indicated below  PATIENT EDUCATION: Education details: OT services, POC, goals, Pt. Functional status. Person educated: Patient Education method: Explanation, Demonstration, and Verbal cues Education comprehension: verbalized understanding and needs further education  HOME EXERCISE PROGRAM: Continue to assess, and provided as indicated   GOALS: Goals reviewed with patient? Yes  SHORT TERM GOALS: Target date: 02/11/2024   Pt. Will be independent with BUE HEP programs. Baseline:EVal: No current HEP. Goal status: INITIAL  LONG TERM GOALS: Target date: 03/24/2024    Pt. Will independently be able to use scissors to cut a straight line. Baseline: Eval: Pt. Has difficulty using scissors to cut a straight line. Goal status: INITIAL  2.  Pt. Will fix her hair in an updo with modified independence Baseline: Eval: Pt. Is unable to tie her hair into a ponytail. Goal status: INITIAL  3.  Pt. Will independently perform  nailcare. Baseline: Eval: Pt. has difficulty completing nail care Goal status: INITIAL  4.  Pt. Will independently, and accurately locate the middle, and center shirts for screen printing. Baseline: Eval: Pt. Has difficulty with locating the middle, and centering shirts for screen printing Goal status: INITIAL  5.  Pt. Will demonstrate compensatory strategies for home management tasks. Baseline: Eval: Education to be provided about compensatory  strategies Goal status: INITIAL  6. Pt. Will improve right hand [facility] skills by 5 sec. of speed to be able to fasten her pants  Baseline: Eval: R: 1 min. & 17 sec. L: 37 sec. Pt. Has difficulty fastening her pants.   ASSESSMENT:  CLINICAL IMPRESSION: Patient is a 28 y.o. female who was seen today for occupational therapy evaluation for CP.  Pt. Presents with weakness, and  decreased [facility] skills which hinders her ability perform hair care tying her hair back into a ponytail,  using scissors to cut a straight line, centering a shirt for screen printing, performing nail care, fastening her pants, and performing home and household tasks. Pt. Will benefit from OT services to work on improving functional performance with ADLs, and IADL functioning, maximizing independence, and decreasing dependence on caregivers.   PERFORMANCE DEFICITS: in functional skills including ADLs, IADLs, coordination, dexterity, strength, Fine motor control, Gross motor control, decreased knowledge of use of DME, and UE functional use, cognitive skills including , and psychosocial skills including coping strategies, environmental adaptation, and routines and behaviors.   IMPAIRMENTS: are limiting patient from ADLs, IADLs, and leisure.   CO-MORBIDITIES: may have co-morbidities  that affects occupational performance. Patient will benefit from skilled OT to address above impairments and improve overall function.  MODIFICATION OR ASSISTANCE TO COMPLETE EVALUATION: Min-Moderate  modification of tasks or assist with assess necessary to complete an evaluation.  OT OCCUPATIONAL PROFILE AND HISTORY: Detailed assessment: Review of records and additional review of physical, cognitive, psychosocial history related to current functional performance.  CLINICAL DECISION MAKING: Moderate - several treatment options, min-mod task modification necessary  REHAB POTENTIAL: Good  EVALUATION COMPLEXITY: Moderate    PLAN:  OT FREQUENCY: 1x/week  OT DURATION: 12 weeks  PLANNED INTERVENTIONS: 97168 OT Re-evaluation, 97535 self care/ADL training, 78295 therapeutic exercise, 97530 therapeutic activity, 97112 neuromuscular re-education, 97140 manual therapy, 97018 paraffin, 62130 moist heat, 97034 contrast bath, 97760 Orthotics management and training, 86578 Splinting (initial encounter), functional mobility training, visual/perceptual remediation/compensation, patient/family education, and DME and/or AE instructions  RECOMMENDED OTHER SERVICES: PT  CONSULTED AND AGREED WITH PLAN OF CARE: Patient  PLAN FOR NEXT SESSION: Treatment   Olegario Messier, MS, OTR/L  12/31/2023, 4:59 PM

## 2024-01-07 ENCOUNTER — Ambulatory Visit: Payer: MEDICAID | Admitting: Occupational Therapy

## 2024-01-07 ENCOUNTER — Ambulatory Visit: Payer: MEDICAID

## 2024-01-07 DIAGNOSIS — M6281 Muscle weakness (generalized): Secondary | ICD-10-CM

## 2024-01-07 DIAGNOSIS — R278 Other lack of coordination: Secondary | ICD-10-CM

## 2024-01-07 DIAGNOSIS — M25662 Stiffness of left knee, not elsewhere classified: Secondary | ICD-10-CM

## 2024-01-07 DIAGNOSIS — M25661 Stiffness of right knee, not elsewhere classified: Secondary | ICD-10-CM

## 2024-01-07 DIAGNOSIS — M79605 Pain in left leg: Secondary | ICD-10-CM

## 2024-01-07 DIAGNOSIS — R262 Difficulty in walking, not elsewhere classified: Secondary | ICD-10-CM

## 2024-01-07 NOTE — Therapy (Signed)
 OUTPATIENT OCCUPATIONAL THERAPY NEURO TREATMENT NOTE  Patient Name: Renee Walton MRN: [id number] DOB:May 16, 1996, 28 y.o., female Today's Date: 01/07/2024   REFERRING PROVIDER: Yves Dill, MD  END OF SESSION:  OT End of Session - 01/07/24 1648     Visit Number 2    Number of Visits 12    Date for OT Re-Evaluation 03/24/24    OT Start Time 1445    OT Stop Time 1530    OT Time Calculation (min) 45 min    Activity Tolerance Patient tolerated treatment well    Behavior During Therapy WFL for tasks assessed/performed             Past Medical History:  Diagnosis Date   Breakthrough bleeding    Cerebral palsy (HCC)    GAD (generalized anxiety disorder)    Migraine headache    Past Surgical History:  Procedure Laterality Date   LEG SURGERY     bilateral x2 in 2008   Patient Active Problem List   Diagnosis Date Noted   Seasonal allergic rhinitis due to pollen 03/27/2023   Bronchitis 03/13/2023   Gastroesophageal reflux disease without esophagitis 02/24/2023   Migraine without aura and without status migrainosus, not intractable 02/24/2023   GAD (generalized anxiety disorder) 02/24/2023   Loss of weight 07/17/2015   Depression 07/17/2015   Cerebral palsy (HCC) 07/17/2015    ONSET DATE: 1995  REFERRING DIAG: CP  THERAPY DIAG:  Muscle weakness (generalized)  Other lack of coordination  Rationale for Evaluation and Treatment: Rehabilitation  SUBJECTIVE:   SUBJECTIVE STATEMENT:  Pt. reports that she is going to leave her hair undone for the next visit, and plans to bring in her scale for centering shirts.  Pt accompanied by: self  PERTINENT HISTORY: Pt. is a 28 y.o. female with a diagnosis of CP. PMHx includes: GAD, Migraine headache,GERD, bilateral LE surgery x's 2 in 2008.  PRECAUTIONS: None  WEIGHT BEARING RESTRICTIONS: No  PAIN:  Are you having pain?  No reports of pain  FALLS: Has patient fallen in last 6 months? No  LIVING ENVIRONMENT: Lives  with: lives with their family Multiple family members Lives in: House/apartment Stairs: 2 floors-stays on the main floor,  Has following equipment at home: Dan Humphreys - 2 wheeled, Wheelchair (power), and Grab bars  PLOF:  Has a Nurse, adult.  PATIENT GOALS: To be able to tie her hair back, or try a new hairdo.  OBJECTIVE:  Note: Objective measures were completed at Evaluation unless otherwise noted.  HAND DOMINANCE: Left  ADLs:  Transfers/ambulation related to ADLs: Eating: Independent Grooming: Independent; assist required for hair care/ponytail UB Dressing: Independent LB Dressing: Independent, difficulty fastening pant button, soc Toileting: Mother assists  Bathing: Independent with washing, assist with drying. 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 Is working with PT  POSTURE COMMENTS:   Sitting balance: good supported  ACTIVITY TOLERANCE: Activity tolerance: Fair  FUNCTIONAL OUTCOME MEASURES: TBD  UPPER EXTREMITY ROM:    Active ROM Right Eval WFL Left Eval [facility]  Shoulder flexion    Shoulder abduction    Shoulder adduction    Shoulder extension    Shoulder internal rotation    Shoulder external rotation  Elbow flexion    Elbow extension    Wrist flexion    Wrist extension    Wrist ulnar deviation    Wrist radial deviation    Wrist pronation    Wrist supination    (Blank rows = not tested)  UPPER EXTREMITY MMT:     MMT Right Eval WFL Left Eval [facility]  Shoulder flexion    Shoulder abduction    Shoulder adduction    Shoulder extension    Shoulder internal rotation    Shoulder external rotation     Middle trapezius    Lower trapezius    Elbow flexion    Elbow extension    Wrist flexion    Wrist extension    Wrist ulnar deviation    Wrist radial deviation    Wrist pronation    Wrist supination    (Blank rows = not tested)  HAND FUNCTION: Grip strength: Right: 13 lbs; Left: 18 lbs, Lateral pinch: Right: 3 lbs, Left: 8 lbs, and 3 point pinch: Right: 8 lbs, Left: 8 lbs  COORDINATION: 9 Hole Peg test: Right: 1 min. & 17 sec; Left: 37 sec  SENSATION: WFL light touch, and proprioceptive awareness  COGNITION: Overall cognitive status: Within functional limits for tasks assessed  VISION: Subjective report: No change form baseline   PERCEPTION: WFL  PRAXIS: TBD                                                                                                                            TREATMENT DATE: 01/07/2024   There. Ex.:   -Bilateral progressive gross grip strengthening with 6.6#  of grip strength resistive force for the right hand, and 11.2# with the left hand for 1 trial each. -Reviewed HEP for yellow theraputty for bilateral gross gripping, lateral pinch, 3pt. Pinch, and 2pt. Pinch.  Self-care:  -worked on a technique for folding a shirt at the tabletop surface requiring verbal cues, and cues for visual demonstration. Multiple trials were performed -worked on cutting a straight line using spring form scissors with positive deviation from the line multiple times. -briefly reviewed strategies for hair care applying a ponytail. Plan to practice haircare options next visit.  Therapeutic Activities:   -Reviewed strategies for locating the center of a T-shirt in preparation for screen printing. -Pt. plans to bring in her scale to the next visit   PATIENT EDUCATION: Education details: OT services, POC, goals, Pt. Functional status. Person educated: Patient Education method: Explanation, Demonstration, and Verbal cues Education comprehension: verbalized understanding  and needs further education  HOME EXERCISE PROGRAM: Continue to assess, and provided as indicated   GOALS: Goals reviewed with patient? Yes  SHORT TERM GOALS: Target date: 02/11/2024   Pt. Will be independent with BUE HEP programs. Baseline:EVal: No current HEP. Goal status: INITIAL  LONG TERM GOALS: Target date: 03/24/2024    Pt. Will independently be able to use scissors to cut a straight line. Baseline: Eval: Pt. Has difficulty  using scissors to cut a straight line. Goal status: INITIAL  2.  Pt. Will fix her hair in an updo with modified independence Baseline: Eval: Pt. Is unable to tie her hair into a ponytail. Goal status: INITIAL  3.  Pt. Will independently perform nailcare. Baseline: Eval: Pt. has difficulty completing nail care Goal status: INITIAL  4.  Pt. Will independently, and accurately locate the middle, and center shirts for screen printing. Baseline: Eval: Pt. Has difficulty with locating the middle, and centering shirts for screen printing Goal status: INITIAL  5.  Pt. Will demonstrate compensatory strategies for home management tasks. Baseline: Eval: Education to be provided about compensatory strategies Goal status: INITIAL  6. Pt. Will improve right hand [facility] skills by 5 sec. of speed to be able to fasten her pants  Baseline: Eval: R: 1 min. & 17 sec. L: 37 sec. Pt. Has difficulty fastening her pants.   ASSESSMENT:  CLINICAL IMPRESSION:  Pt. requires consistent cues and assist for hand position during each task performed. Pt. Required assist to align the edges, and sleeves of the T-shirt. Pt. using her bilateral hands together to donn a ponytail during hair care. Pt. deviated from the straight line multiple times when using spring-form scissors.  Pt. continues to benefit from OT services to work on improving functional performance with ADLs, and IADL functioning, maximizing independence, and decreasing dependence on caregivers.   PERFORMANCE DEFICITS:  in functional skills including ADLs, IADLs, coordination, dexterity, strength, Fine motor control, Gross motor control, decreased knowledge of use of DME, and UE functional use, cognitive skills including , and psychosocial skills including coping strategies, environmental adaptation, and routines and behaviors.   IMPAIRMENTS: are limiting patient from ADLs, IADLs, and leisure.   CO-MORBIDITIES: may have co-morbidities  that affects occupational performance. Patient will benefit from skilled OT to address above impairments and improve overall function.  MODIFICATION OR ASSISTANCE TO COMPLETE EVALUATION: Min-Moderate modification of tasks or assist with assess necessary to complete an evaluation.  OT OCCUPATIONAL PROFILE AND HISTORY: Detailed assessment: Review of records and additional review of physical, cognitive, psychosocial history related to current functional performance.  CLINICAL DECISION MAKING: Moderate - several treatment options, min-mod task modification necessary  REHAB POTENTIAL: Good  EVALUATION COMPLEXITY: Moderate    PLAN:  OT FREQUENCY: 1x/week  OT DURATION: 12 weeks  PLANNED INTERVENTIONS: 97168 OT Re-evaluation, 97535 self care/ADL training, 25956 therapeutic exercise, 97530 therapeutic activity, 97112 neuromuscular re-education, 97140 manual therapy, 97018 paraffin, 38756 moist heat, 97034 contrast bath, 97760 Orthotics management and training, 43329 Splinting (initial encounter), functional mobility training, visual/perceptual remediation/compensation, patient/family education, and DME and/or AE instructions  RECOMMENDED OTHER SERVICES: PT  CONSULTED AND AGREED WITH PLAN OF CARE: Patient  PLAN FOR NEXT SESSION: Treatment   Olegario Messier, MS, OTR/L  01/07/2024, 4:52 PM

## 2024-01-07 NOTE — Therapy (Signed)
 OUTPATIENT PHYSICAL THERAPY NEURO TREATMENT NOTE   Patient Name: Renee Walton MRN: [id number] DOB:1995/12/15, 28 y.o., female Today's Date: 01/07/2024   PCP: [name], MD REFERRING PROVIDER:   [name], MD    END OF SESSION:  PT End of Session - 01/07/24 1558     Visit Number 34    Number of Visits 43    Date for PT Re-Evaluation 01/21/24    Authorization Type Medicaid [place] Access    Authorization Time Period 02/25/22-06/02/22 for 12 visits    PT Start Time 1402    PT Stop Time 1444    PT Time Calculation (min) 42 min    Equipment Utilized During Treatment Gait belt    Activity Tolerance Patient tolerated treatment well    Behavior During Therapy WFL for tasks assessed/performed                        Past Medical History:  Diagnosis Date   Breakthrough bleeding    Cerebral palsy (HCC)    GAD (generalized anxiety disorder)    Migraine headache    Past Surgical History:  Procedure Laterality Date   LEG SURGERY     bilateral x2 in 2008   Patient Active Problem List   Diagnosis Date Noted   Seasonal allergic rhinitis due to pollen 03/27/2023   Bronchitis 03/13/2023   Gastroesophageal reflux disease without esophagitis 02/24/2023   Migraine without aura and without status migrainosus, not intractable 02/24/2023   GAD (generalized anxiety disorder) 02/24/2023   Loss of weight 07/17/2015   Depression 07/17/2015   Cerebral palsy (HCC) 07/17/2015    ONSET DATE: 11/28/1993 pt has CP  REFERRING DIAG:   G80.9 (ICD-10-CM) - Cerebral palsy (HCC)    THERAPY DIAG:  Other lack of coordination  Muscle weakness (generalized)  Difficulty in walking, not elsewhere classified  Joint stiffness of right lower leg  Joint stiffness of left lower leg  Pain in left leg  Rationale for Evaluation and Treatment: Rehabilitation  SUBJECTIVE:                                                                                                                                                                                              SUBJECTIVE STATEMENT   No significant changes since last seen. Pt with ongoing pain/discomfort felt in LLE, distal to knee today. Pt accompanied by: self  PERTINENT HISTORY:    Pt is a pleasant 29 y/o female with CP who is returning to PT after absence and trip to [place]. She would like to improve her mobility, gait and balance.  Pt has continued her PT exercises while away and worked on gait, and wants to become independent using a RW. She reports 2 recent falls, and no injuries. Pt also with stiffness in BLEs. PMH consists of the following: CP, depression, GAD, migraine headache, hx of LE surgery bilateral x2 in 2008.    PAIN: from eval Are you having pain? No  PRECAUTIONS: Fall  WEIGHT BEARING RESTRICTIONS: No  FALLS: Has patient fallen in last 6 months? Yes. Number of falls 2 when walking. She reports no injury. She got back up independently   PATIENT GOALS: Pt would like to improve her ability to ambulate with RW both inside and over outside surfaces in order to increase her independence with mobility. She would like to learn how to safely get in and out of a car with her RW.  OBJECTIVE:   Today's Treatment: 01/07/24 Gait belt donned and CGA provided for upright activities unless otherwise noted   TE: Seated march x multiple reps each LE, difficulty with technique LAQ x multiple reps each LE, difficulty with technique STS x multiple reps, able to complete hands free and maintain balance indep   NMR:: Gait for dynamic balance activity with addition of endurance x295 ft with close CGA  - no LOB   Manual: on mat table Several minutes of the following PROM hip abduction/adduction x multiple reps each LE PROM knee to chest 2x10-15 each LE Long duration hamstring stretch x 2 rounds each LE  Adductor stretch long duration Gastroc stretch each LE  PROM ankle DF/PF each LE x multiple  reps    PATIENT EDUCATION: Education details: Pt educated throughout session about proper posture and technique with exercises. Improved exercise technique, movement at target joints, use of target muscles after min to mod verbal, visual, tactile cues.    Person educated: Patient Education method: Explanation, Demonstration, and Verbal cues Education comprehension: verbalized understanding and returned demonstration  HOME EXERCISE PROGRAM See pt instruction section   GOALS: Goals reviewed with patient? Yes  SHORT TERM GOALS: Target date: 12/10/2023    Patient will be independent in home exercise program to improve strength/mobility for better functional independence with ADLs. Baseline: to be initiated next 1-2 visits; 02/19/2023: pt not consistently performing HEP; 04/23/23:  Pt reports she goes to gym every three days; 8/7: Pt reports she has been increasing her activity levels, but currently waiting for caregiver to start to be able to go to gym Goal status: IN PROGRESS  LONG TERM GOALS: Target date: 01/21/2024     Patient will increase FOTO score to equal to or greater than  76  to demonstrate statistically significant improvement in mobility and quality of life.  Baseline: risk adjusted 51; 02/19/2023: 63 * pt reporting pain in left leg limiting stair climbing; 04/23/23: 81 Goal status: MET/DISCONTINUED  2.  Pt will reduce 5xsSTS time to < 8 sec to demonstrate clinically significant improvement in LE strength and balance to reduce risk of falls.  Baseline: previously 12.76 sec on 06/10/22, now 10 sec to RW on 10/22/2022; 02/19/2023 8.5 seconds hands-free; 04/23/23: 7 seconds; 10/29/23: 6.5 sec  goal status: MET  3.  Pt will improve 6 MWT to > 1000' with no AD and no LOB to demonstrate ability to improve safety with community distances. Baseline: was 822 ft back in August 2023, now 808 with RW, CGA; 02/19/2023 845 ft with 1OX; 04/23/23: 820 ft 4WW; 05/28/23:  728 ft with RW, limited by LLE pain;  07/30/2023: 878 ft with  RW; 10/08/23: 518 pt somewhat distracted throughout; 935 ft with no AD, CGA  Goal status: IN PROGRESS  4.  Pt will improve 10 m gait speed with LRAD to > 1.0 m/s to demonstrate reduced risk of falls along with safe ability to complete community walking tasks Baseline: previously 0.7 m/s on 06/10/2022, now 0.77 m/s with RW; 02/19/2023: 0.82 m/s with RW; 04/23/23: 0.95 m/s with RW; 05/28/23: 0.83 m/s pain limited LLE; 10/9: 1.08 m/s with RW  Goal status: MET  5.  Patient will increase Berg Balance score by > 6 points to demonstrate decreased fall risk during functional activities.  Baseline: previously 35/56 on 03/11/22(?), test deferred to next appt; 11/13/2022: 36/56; 02/19/2023: 39/56; 04/23/23: 42/56  Goal status: MET  6.  Pt will report improved stool consistency type 4 across 50% of the time and demo less flexed coccyx across 2 weeks in order to promote bowel elimination  Baseline: Pt has Type 1 stool consistency based on Bristol stool scale 100% and her mom has to put boiled water to soften the stool to flush every time.  Coccyx flexed  Goal status: discontinued/pt not currently being seen by pelvic PT    ASSESSMENT:  CLINICAL IMPRESSION: Pt exhibits good balance with hands-free STS today and independent. While pt making gains, she still exhibits increased BLE stiffness, particularly in hamstring and gastrocs. Plan to focus on instruction in how to self-stretch outside of PT and seated or standing positions future visits.The pt will benefit from further skilled PT to improve balance, gait and mobility.   OBJECTIVE IMPAIRMENTS: Abnormal gait, decreased activity tolerance, decreased balance, decreased coordination, decreased endurance, decreased knowledge of use of DME, decreased mobility, difficulty walking, decreased strength, impaired tone, improper body mechanics, postural dysfunction, and pain.   ACTIVITY LIMITATIONS: carrying, lifting, bending, standing, squatting, stairs,  transfers, dressing, and locomotion level  PARTICIPATION LIMITATIONS: meal prep, cleaning, shopping, community activity, and yard work  PERSONAL FACTORS: Fitness, Sex, Time since onset of injury/illness/exacerbation, and 3+ comorbidities: PMH consists of the following: CP, depression, GAD, migraine headache, hx of LE surgery bilateral x2 in 2008  are also affecting patient's functional outcome.   REHAB POTENTIAL: Good  CLINICAL DECISION MAKING: Evolving/moderate complexity  EVALUATION COMPLEXITY: Moderate  PLAN:  PT FREQUENCY: 1-2x/week  PT DURATION: 12 weeks  PLANNED INTERVENTIONS: Therapeutic exercises, Therapeutic activity, Neuromuscular re-education, Balance training, Gait training, Patient/Family education, Self Care, Joint mobilization, Stair training, Vestibular training, Canalith repositioning, Visual/preceptual remediation/compensation, Orthotic/Fit training, DME instructions, Dry Needling, Electrical stimulation, Wheelchair mobility training, Spinal mobilization, Cryotherapy, Moist heat, Splintting, Taping, Biofeedback, Manual therapy, and Re-evaluation  PLAN FOR NEXT SESSION: gait mechanics, improve step-width, strengthening BLE, hip abduction, stretching, manual RLE; instruction Self-stretching/tone management in standing, seated stretch ankles/toes  Baird Kay, PT 01/07/2024, 3:59 PM  3:59 PM, 01/07/24 Physical Therapist - [facility]

## 2024-01-13 ENCOUNTER — Ambulatory Visit: Payer: MEDICAID | Admitting: Internal Medicine

## 2024-01-14 ENCOUNTER — Ambulatory Visit: Payer: MEDICAID

## 2024-01-14 ENCOUNTER — Ambulatory Visit: Payer: MEDICAID | Admitting: Occupational Therapy

## 2024-01-21 ENCOUNTER — Ambulatory Visit: Payer: MEDICAID

## 2024-01-22 ENCOUNTER — Ambulatory Visit: Payer: MEDICAID | Admitting: Internal Medicine

## 2024-01-27 ENCOUNTER — Telehealth: Payer: Self-pay | Admitting: Internal Medicine

## 2024-01-27 NOTE — Telephone Encounter (Signed)
 Patient left VM requesting a return call. Did not state what she needed.

## 2024-01-28 ENCOUNTER — Ambulatory Visit: Payer: MEDICAID | Attending: Internal Medicine

## 2024-01-28 ENCOUNTER — Ambulatory Visit: Payer: MEDICAID | Admitting: Occupational Therapy

## 2024-01-28 DIAGNOSIS — M25661 Stiffness of right knee, not elsewhere classified: Secondary | ICD-10-CM | POA: Diagnosis present

## 2024-01-28 DIAGNOSIS — R262 Difficulty in walking, not elsewhere classified: Secondary | ICD-10-CM | POA: Diagnosis present

## 2024-01-28 DIAGNOSIS — M25662 Stiffness of left knee, not elsewhere classified: Secondary | ICD-10-CM | POA: Insufficient documentation

## 2024-01-28 DIAGNOSIS — M6281 Muscle weakness (generalized): Secondary | ICD-10-CM

## 2024-01-28 DIAGNOSIS — R2681 Unsteadiness on feet: Secondary | ICD-10-CM | POA: Diagnosis present

## 2024-01-28 DIAGNOSIS — R278 Other lack of coordination: Secondary | ICD-10-CM | POA: Insufficient documentation

## 2024-01-28 DIAGNOSIS — G809 Cerebral palsy, unspecified: Secondary | ICD-10-CM | POA: Insufficient documentation

## 2024-01-28 NOTE — Therapy (Unsigned)
 OUTPATIENT PHYSICAL THERAPY NEURO TREATMENT NOTE/RECERT   Patient Name: Renee Walton MRN: [id number] DOB:1995/11/02, 28 y.o., female Today's Date: 01/28/2024   PCP: [name], MD REFERRING PROVIDER:   [name], MD    END OF SESSION:  PT End of Session - 01/28/24 1448     Visit Number 35    Number of Visits 43    Date for PT Re-Evaluation 01/21/24    Authorization Type Medicaid [place] Access    Authorization Time Period 02/25/22-06/02/22 for 12 visits    Equipment Utilized During Treatment Gait belt    Activity Tolerance Patient tolerated treatment well    Behavior During Therapy [facility] for tasks assessed/performed                        Past Medical History:  Diagnosis Date   Breakthrough bleeding    Cerebral palsy (HCC)    GAD (generalized anxiety disorder)    Migraine headache    Past Surgical History:  Procedure Laterality Date   LEG SURGERY     bilateral x2 in 2008   Patient Active Problem List   Diagnosis Date Noted   Seasonal allergic rhinitis due to pollen 03/27/2023   Bronchitis 03/13/2023   Gastroesophageal reflux disease without esophagitis 02/24/2023   Migraine without aura and without status migrainosus, not intractable 02/24/2023   GAD (generalized anxiety disorder) 02/24/2023   Loss of weight 07/17/2015   Depression 07/17/2015   Cerebral palsy (HCC) 07/17/2015    ONSET DATE: 03-21-1994 pt has CP  REFERRING DIAG:   G80.9 (ICD-10-CM) - Cerebral palsy (HCC)    THERAPY DIAG:  No diagnosis found.  Rationale for Evaluation and Treatment: Rehabilitation  SUBJECTIVE:                                                                                                                                                                                             SUBJECTIVE STATEMENT   Pt reports her power WC battery died. She is doing OK but reports some "tail-bone" pain. Next week going to start at gym and pool. Getting botox on the  9th of next month Pt accompanied by: self  PERTINENT HISTORY:    Pt is a pleasant 28 y/o female with CP who is returning to PT after absence and trip to [place]. She would like to improve her mobility, gait and balance. Pt has continued her PT exercises while away and worked on gait, and wants to become independent using a RW. She reports 2 recent falls, and no injuries. Pt also with stiffness in BLEs. PMH consists of the  following: CP, depression, GAD, migraine headache, hx of LE surgery bilateral x2 in 2008.    PAIN: from eval Are you having pain? No  PRECAUTIONS: Fall  WEIGHT BEARING RESTRICTIONS: No  FALLS: Has patient fallen in last 6 months? Yes. Number of falls 2 when walking. She reports no injury. She got back up independently   PATIENT GOALS: Pt would like to improve her ability to ambulate with RW both inside and over outside surfaces in order to increase her independence with mobility. She would like to learn how to safely get in and out of a car with her RW.  OBJECTIVE:   Today's Treatment: 01/28/24 Gait belt donned and CGA provided for upright activities unless otherwise noted    Airex fwd and ltl step ups Retro gait for mobility Standing adductor stretch   --- TE: Seated march x multiple reps each LE, difficulty with technique LAQ x multiple reps each LE, difficulty with technique STS x multiple reps, able to complete hands free and maintain balance indep   NMR:: Gait for dynamic balance activity with addition of endurance x295 ft with close CGA  - no LOB   Manual: on mat table Several minutes of the following PROM hip abduction/adduction x multiple reps each LE PROM knee to chest 2x10-15 each LE Long duration hamstring stretch x 2 rounds each LE  Adductor stretch long duration Gastroc stretch each LE  PROM ankle DF/PF each LE x multiple reps    PATIENT EDUCATION: Education details: Pt educated throughout session about proper posture and  technique with exercises. Improved exercise technique, movement at target joints, use of target muscles after min to mod verbal, visual, tactile cues.    Person educated: Patient Education method: Explanation, Demonstration, and Verbal cues Education comprehension: verbalized understanding and returned demonstration  HOME EXERCISE PROGRAM See pt instruction section   GOALS: Goals reviewed with patient? Yes  SHORT TERM GOALS: Target date: 12/10/2023    Patient will be independent in home exercise program to improve strength/mobility for better functional independence with ADLs. Baseline: to be initiated next 1-2 visits; 02/19/2023: pt not consistently performing HEP; 04/23/23:  Pt reports she goes to gym every three days; 8/7: Pt reports she has been increasing her activity levels, but currently waiting for caregiver to start to be able to go to gym; 01/28/2024:  exercises are going well per pt report Goal status: MET  LONG TERM GOALS: Target date: 01/21/2024     Patient will increase FOTO score to equal to or greater than  76  to demonstrate statistically significant improvement in mobility and quality of life.  Baseline: risk adjusted 51; 02/19/2023: 63 * pt reporting pain in left leg limiting stair climbing; 04/23/23: 81 Goal status: MET/DISCONTINUED  2.  Pt will reduce 5xsSTS time to < 8 sec to demonstrate clinically significant improvement in LE strength and balance to reduce risk of falls.  Baseline: previously 12.76 sec on 06/10/22, now 10 sec to RW on 10/22/2022; 02/19/2023 8.5 seconds hands-free; 04/23/23: 7 seconds; 10/29/23: 6.5 sec  goal status: MET  3.  Pt will improve 6 MWT to > 1000' with no AD and no LOB to demonstrate ability to improve safety with community distances. Baseline: was 822 ft back in August 2023, now 808 with RW, CGA; 02/19/2023 845 ft with 4UJ; 04/23/23: 820 ft 4WW; 05/28/23:  728 ft with RW, limited by LLE pain; 07/30/2023: 878 ft with RW; 10/08/23: 518 pt somewhat distracted  throughout; 935 ft with no AD,  CGA; 01/28/24 645 ft with RW (used as pt slightly unsteady today)  Goal status: IN PROGRESS  4.  Pt will improve 10 m gait speed with LRAD to > 1.0 m/s to demonstrate reduced risk of falls along with safe ability to complete community walking tasks Baseline: previously 0.7 m/s on 06/10/2022, now 0.77 m/s with RW; 02/19/2023: 0.82 m/s with RW; 04/23/23: 0.95 m/s with RW; 05/28/23: 0.83 m/s pain limited LLE; 10/9: 1.08 m/s with RW  Goal status: MET  5.  Patient will increase Berg Balance score by > 6 points to demonstrate decreased fall risk during functional activities.  Baseline: previously 35/56 on 03/11/22(?), test deferred to next appt; 11/13/2022: 36/56; 02/19/2023: 39/56; 04/23/23: 42/56  Goal status: MET  6.  Pt will report improved stool consistency type 4 across 50% of the time and demo less flexed coccyx across 2 weeks in order to promote bowel elimination  Baseline: Pt has Type 1 stool consistency based on [place] stool scale 100% and her mom has to put boiled water to soften the stool to flush every time.  Coccyx flexed  Goal status: discontinued/pt not currently being seen by pelvic PT    ASSESSMENT:  CLINICAL IMPRESSION: Pt exhibits good balance with hands-free STS today and independent. While pt making gains, she still exhibits increased BLE stiffness, particularly in hamstring and gastrocs. Plan to focus on instruction in how to self-stretch outside of PT and seated or standing positions future visits.The pt will benefit from further skilled PT to improve balance, gait and mobility.   OBJECTIVE IMPAIRMENTS: Abnormal gait, decreased activity tolerance, decreased balance, decreased coordination, decreased endurance, decreased knowledge of use of DME, decreased mobility, difficulty walking, decreased strength, impaired tone, improper body mechanics, postural dysfunction, and pain.   ACTIVITY LIMITATIONS: carrying, lifting, bending, standing, squatting, stairs,  transfers, dressing, and locomotion level  PARTICIPATION LIMITATIONS: meal prep, cleaning, shopping, community activity, and yard work  PERSONAL FACTORS: Fitness, Sex, Time since onset of injury/illness/exacerbation, and 3+ comorbidities: PMH consists of the following: CP, depression, GAD, migraine headache, hx of LE surgery bilateral x2 in 2008  are also affecting patient's functional outcome.   REHAB POTENTIAL: Good  CLINICAL DECISION MAKING: Evolving/moderate complexity  EVALUATION COMPLEXITY: Moderate  PLAN:  PT FREQUENCY: 1-2x/week  PT DURATION: 12 weeks  PLANNED INTERVENTIONS: Therapeutic exercises, Therapeutic activity, Neuromuscular re-education, Balance training, Gait training, Patient/Family education, Self Care, Joint mobilization, Stair training, Vestibular training, Canalith repositioning, Visual/preceptual remediation/compensation, Orthotic/Fit training, DME instructions, Dry Needling, Electrical stimulation, Wheelchair mobility training, Spinal mobilization, Cryotherapy, Moist heat, Splintting, Taping, Biofeedback, Manual therapy, and Re-evaluation  PLAN FOR NEXT SESSION: gait mechanics, improve step-width, strengthening BLE, hip abduction, stretching, manual RLE; instruction Self-stretching/tone management in standing, seated stretch ankles/toes  Baird Kay, PT 01/28/2024, 2:50 PM  2:50 PM, 01/28/24 Physical Therapist - [facility]

## 2024-01-30 NOTE — Therapy (Signed)
 OUTPATIENT OCCUPATIONAL THERAPY NEURO TREATMENT NOTE  Patient Name: Renee Walton MRN: [id number] DOB:[birth year], 28 y.o., female Today's Date: 01/30/2024   REFERRING PROVIDER: Yves Dill, MD  END OF SESSION:  OT End of Session - 01/30/24 2319     Visit Number 3    Number of Visits 12    Date for OT Re-Evaluation 03/24/24    OT Start Time 1400    OT Stop Time 1430    OT Time Calculation (min) 30 min    Activity Tolerance Patient tolerated treatment well    Behavior During Therapy [facility] for tasks assessed/performed             Past Medical History:  Diagnosis Date   Breakthrough bleeding    Cerebral palsy (HCC)    GAD (generalized anxiety disorder)    Migraine headache    Past Surgical History:  Procedure Laterality Date   LEG SURGERY     bilateral x2 in 2008   Patient Active Problem List   Diagnosis Date Noted   Seasonal allergic rhinitis due to pollen 03/27/2023   Bronchitis 03/13/2023   Gastroesophageal reflux disease without esophagitis 02/24/2023   Migraine without aura and without status migrainosus, not intractable 02/24/2023   GAD (generalized anxiety disorder) 02/24/2023   Loss of weight 07/17/2015   Depression 07/17/2015   Cerebral palsy (HCC) 07/17/2015    ONSET DATE: 1995  REFERRING DIAG: CP  THERAPY DIAG:  Muscle weakness (generalized)  Rationale for Evaluation and Treatment: Rehabilitation  SUBJECTIVE:   SUBJECTIVE STATEMENT:  Pt. reports that she would like to try a Pony-o hair clip. Pt accompanied by: self  PERTINENT HISTORY: Pt. is a 28 y.o. female with a diagnosis of CP. PMHx includes: GAD, Migraine headache,GERD, bilateral LE surgery x's 2 in 2008.  PRECAUTIONS: None  WEIGHT BEARING RESTRICTIONS: No  PAIN:  Are you having pain?  No reports of pain  FALLS: Has patient fallen in last 6 months? No  LIVING ENVIRONMENT: Lives with: lives with their family Multiple family members Lives in: House/apartment Stairs: 2  floors-stays on the main floor,  Has following equipment at home: Dan Humphreys - 2 wheeled, Wheelchair (power), and Grab bars  PLOF:  Has a Nurse, adult.  PATIENT GOALS: To be able to tie her hair back, or try a new hairdo.  OBJECTIVE:  Note: Objective measures were completed at Evaluation unless otherwise noted.  HAND DOMINANCE: Left  ADLs:  Transfers/ambulation related to ADLs: Eating: Independent Grooming: Independent; assist required for hair care/ponytail UB Dressing: Independent LB Dressing: Independent, difficulty fastening pant button, soc Toileting: Mother assists  Bathing: Independent with washing, assist with drying. 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 Is working with PT  POSTURE COMMENTS:   Sitting balance: good supported  ACTIVITY TOLERANCE: Activity tolerance: Fair  FUNCTIONAL OUTCOME MEASURES: TBD  UPPER EXTREMITY ROM:    Active ROM Right Eval WFL Left Eval [facility]  Shoulder flexion    Shoulder abduction    Shoulder adduction    Shoulder extension    Shoulder internal rotation    Shoulder external rotation    Elbow flexion    Elbow extension    Wrist flexion    Wrist extension  Wrist ulnar deviation    Wrist radial deviation    Wrist pronation    Wrist supination    (Blank rows = not tested)  UPPER EXTREMITY MMT:     MMT Right Eval WFL Left Eval [facility]  Shoulder flexion    Shoulder abduction    Shoulder adduction    Shoulder extension    Shoulder internal rotation    Shoulder external rotation    Middle trapezius    Lower trapezius    Elbow flexion    Elbow extension    Wrist flexion     Wrist extension    Wrist ulnar deviation    Wrist radial deviation    Wrist pronation    Wrist supination    (Blank rows = not tested)  HAND FUNCTION: Grip strength: Right: 13 lbs; Left: 18 lbs, Lateral pinch: Right: 3 lbs, Left: 8 lbs, and 3 point pinch: Right: 8 lbs, Left: 8 lbs  COORDINATION: 9 Hole Peg test: Right: 1 min. & 17 sec; Left: 37 sec  SENSATION: WFL light touch, and proprioceptive awareness  COGNITION: Overall cognitive status: Within functional limits for tasks assessed  VISION: Subjective report: No change form baseline   PERCEPTION: WFL  PRAXIS: TBD                                                                                                                            TREATMENT DATE: 01/28/2024   Self-care:  -Assessed BUE, and hand function during use of several different types hairclips including a straight barrett, a long straight clip, and a claw clip  -Reviewed Pony-o hair clip options -Reviewed the use of a scale to assist in locating the center of a T-shirt in preparation for screen printing a shirt.  -Reviewed strategies to assist with using scissors for cutting a straight line on a piece of vinyl for screen printing.    PATIENT EDUCATION: Education details: OT services, POC, goals, Pt. Functional status. Person educated: Patient Education method: Explanation, Demonstration, and Verbal cues Education comprehension: verbalized understanding and needs further education  HOME EXERCISE PROGRAM: Continue to assess, and provided as indicated   GOALS: Goals reviewed with patient? Yes  SHORT TERM GOALS: Target date: 02/11/2024   Pt. Will be independent with BUE HEP programs. Baseline:EVal: No current HEP. Goal status: INITIAL  LONG TERM GOALS: Target date: 03/24/2024    Pt. Will independently be able to use scissors to cut a straight line. Baseline: Eval: Pt. Has difficulty using scissors to cut a straight line. Goal status:  INITIAL  2.  Pt. Will fix her hair in an updo with modified independence Baseline: Eval: Pt. Is unable to tie her hair into a ponytail. Goal status: INITIAL  3.  Pt. Will independently perform nailcare. Baseline: Eval: Pt. has difficulty completing nail care Goal status: INITIAL  4.  Pt. Will independently, and accurately locate the middle, and center shirts for screen printing. Baseline: Eval: Pt. Has difficulty with locating  the middle, and centering shirts for screen printing Goal status: INITIAL  5.  Pt. Will demonstrate compensatory strategies for home management tasks. Baseline: Eval: Education to be provided about compensatory strategies Goal status: INITIAL  6. Pt. Will improve right hand [facility] skills by 5 sec. of speed to be able to fasten her pants  Baseline: Eval: R: 1 min. & 17 sec. L: 37 sec. Pt. Has difficulty fastening her pants.   ASSESSMENT:  CLINICAL IMPRESSION:  Pt. required assist to align the proper scale at the collar of a T-shirt, and fold the T-shirt to locate the center in preparation for screen printing. Pt. Would benefit from using the straight edge of a ruler to run a line from the center of the collar to the middle of the T-shirt. Pt. continues to present with difficulty using scissors to cut a straight line in a piece of vinyl for screen printing. Pt. presented with difficulty securely clasping a standard straight barrett when putting it into her hair, and presented with difficulty positioning a large claw hair clip. Pt. was able to secure a straight clip into her hair. Pt. Plans to take the straight clip home for additional practice. Pt. would like to try the Pony-o hair clip as well. Pt. continues to benefit from OT services to work on improving functional performance with ADLs, and IADL functioning, maximizing independence, and decreasing dependence on caregivers.   PERFORMANCE DEFICITS: in functional skills including ADLs, IADLs, coordination, dexterity,  strength, Fine motor control, Gross motor control, decreased knowledge of use of DME, and UE functional use, cognitive skills including , and psychosocial skills including coping strategies, environmental adaptation, and routines and behaviors.   IMPAIRMENTS: are limiting patient from ADLs, IADLs, and leisure.   CO-MORBIDITIES: may have co-morbidities  that affects occupational performance. Patient will benefit from skilled OT to address above impairments and improve overall function.  MODIFICATION OR ASSISTANCE TO COMPLETE EVALUATION: Min-Moderate modification of tasks or assist with assess necessary to complete an evaluation.  OT OCCUPATIONAL PROFILE AND HISTORY: Detailed assessment: Review of records and additional review of physical, cognitive, psychosocial history related to current functional performance.  CLINICAL DECISION MAKING: Moderate - several treatment options, min-mod task modification necessary  REHAB POTENTIAL: Good  EVALUATION COMPLEXITY: Moderate    PLAN:  OT FREQUENCY: 1x/week  OT DURATION: 12 weeks  PLANNED INTERVENTIONS: 97168 OT Re-evaluation, 97535 self care/ADL training, 86578 therapeutic exercise, 97530 therapeutic activity, 97112 neuromuscular re-education, 97140 manual therapy, 97018 paraffin, 46962 moist heat, 97034 contrast bath, 97760 Orthotics management and training, 95284 Splinting (initial encounter), functional mobility training, visual/perceptual remediation/compensation, patient/family education, and DME and/or AE instructions  RECOMMENDED OTHER SERVICES: PT  CONSULTED AND AGREED WITH PLAN OF CARE: Patient  PLAN FOR NEXT SESSION: Treatment   Olegario Messier, MS, OTR/L  01/30/2024, 11:25 PM

## 2024-02-03 ENCOUNTER — Encounter: Payer: Self-pay | Admitting: Internal Medicine

## 2024-02-03 ENCOUNTER — Ambulatory Visit: Payer: MEDICAID | Admitting: Internal Medicine

## 2024-02-03 VITALS — BP 90/60 | HR 75 | Ht 62.0 in | Wt 88.0 lb

## 2024-02-03 DIAGNOSIS — G801 Spastic diplegic cerebral palsy: Secondary | ICD-10-CM | POA: Diagnosis not present

## 2024-02-03 NOTE — Progress Notes (Signed)
 Patient just came in to report that the battery in her wheelchair died , and needs a new battery. Will send order to DME.

## 2024-02-04 ENCOUNTER — Ambulatory Visit: Payer: MEDICAID

## 2024-02-04 DIAGNOSIS — R262 Difficulty in walking, not elsewhere classified: Secondary | ICD-10-CM

## 2024-02-04 DIAGNOSIS — R278 Other lack of coordination: Secondary | ICD-10-CM

## 2024-02-04 DIAGNOSIS — M25661 Stiffness of right knee, not elsewhere classified: Secondary | ICD-10-CM | POA: Diagnosis not present

## 2024-02-04 DIAGNOSIS — M25662 Stiffness of left knee, not elsewhere classified: Secondary | ICD-10-CM

## 2024-02-04 DIAGNOSIS — R2681 Unsteadiness on feet: Secondary | ICD-10-CM

## 2024-02-04 NOTE — Therapy (Signed)
 OUTPATIENT PHYSICAL THERAPY NEURO TREATMENT NOTE   Patient Name: Renee Walton MRN: [id number] DOB:[birth year], 28 y.o., female Today's Date: 02/04/2024   PCP: [name], MD REFERRING PROVIDER:   [name], MD    END OF SESSION:  PT End of Session - 02/04/24 1538     Visit Number 36    Number of Visits 47    Date for PT Re-Evaluation 04/21/24    Authorization Type Medicaid [place] [employer]    Authorization Time Period 02/25/22-06/02/22 for 12 visits    PT Start Time 1447    PT Stop Time 1528    PT Time Calculation (min) 41 min    Equipment Utilized During Treatment Gait belt    Activity Tolerance Patient tolerated treatment well    Behavior During Therapy WFL for tasks assessed/performed                         Past Medical History:  Diagnosis Date   Breakthrough bleeding    Cerebral palsy (HCC)    GAD (generalized anxiety disorder)    Migraine headache    Past Surgical History:  Procedure Laterality Date   LEG SURGERY     bilateral x2 in 2008   Patient Active Problem List   Diagnosis Date Noted   Seasonal allergic rhinitis due to pollen 03/27/2023   Bronchitis 03/13/2023   Gastroesophageal reflux disease without esophagitis 02/24/2023   Migraine without aura and without status migrainosus, not intractable 02/24/2023   GAD (generalized anxiety disorder) 02/24/2023   Loss of weight 07/17/2015   Depression 07/17/2015   Cerebral palsy (HCC) 07/17/2015    ONSET DATE: Oct [birth year], 1996 pt has CP  REFERRING DIAG:   G80.9 (ICD-10-CM) - Cerebral palsy (HCC)    THERAPY DIAG:  Joint stiffness of right lower leg  Joint stiffness of left lower leg  Other lack of coordination  Difficulty in walking, not elsewhere classified  Unsteadiness on feet  Rationale for Evaluation and Treatment: Rehabilitation  SUBJECTIVE:                                                                                                                                                                                              SUBJECTIVE STATEMENT   Pt is excited to start her college classes. She reports onset of R foot plain (plantar surface) as of one week ago, can also feel in heel. Pain is worse at night. It can feel like a burning sensation. She reports using ice helps. Pt accompanied by: self, mother  PERTINENT HISTORY:    Pt is a pleasant 28 y/o  female with CP who is returning to PT after absence and trip to [place]. She would like to improve her mobility, gait and balance. Pt has continued her PT exercises while away and worked on gait, and wants to become independent using a RW. She reports 2 recent falls, and no injuries. Pt also with stiffness in BLEs. PMH consists of the following: CP, depression, GAD, migraine headache, hx of LE surgery bilateral x2 in 2008.    PAIN: from eval Are you having pain? No  PRECAUTIONS: Fall  WEIGHT BEARING RESTRICTIONS: No  FALLS: Has patient fallen in last 6 months? Yes. Number of falls 2 when walking. She reports no injury. She got back up independently   PATIENT GOALS: Pt would like to improve her ability to ambulate with RW both inside and over outside surfaces in order to increase her independence with mobility. She would like to learn how to safely get in and out of a car with her RW.  OBJECTIVE:   Today's Treatment: 02/04/24 Gait belt donned and CGA provided for upright activities unless otherwise noted   Physical Performance:   NMR: In // bars FWD gait with turns, emphasis on turning place with limited to no UE support - pt does need to use intermittent UE support  x multiple reps and close CGA  [employer]: On large mat table:  Rolling stick to anterior and lateral quad each LE - pt reports makes legs feel better Knee to chest 10x each LE Long-duration supported hamstring stretch  each LE - more limited on RLE Hip abduction/adduction PROM 10x each LE Hip abduction stretch, long duration each LE    Assessment of R foot, plantar surface and digits as pt reports burning sensation/pain at night.  Skin appears WNL, temperature WNL. PT provides gastroc stretch 2x30 sec and STM to plantar surface x 4 min which pt reports improves symptoms    PATIENT EDUCATION: Education details: Pt educated throughout session about proper posture and technique with exercises. Improved exercise technique, movement at target joints, use of target muscles after min to mod verbal, visual, tactile cues.   Person educated: Patient Education method: Explanation, Demonstration, and Verbal cues Education comprehension: verbalized understanding and returned demonstration  HOME EXERCISE PROGRAM See pt instruction section   GOALS: Goals reviewed with patient? Yes  SHORT TERM GOALS: Target date: 03/10/2024    Patient will be independent in home exercise program to improve strength/mobility for better functional independence with ADLs. Baseline: to be initiated next 1-2 visits; 02/19/2023: pt not consistently performing HEP; 04/23/23:  Pt reports she goes to gym every three days; 8/7: Pt reports she has been increasing her activity levels, but currently waiting for caregiver to start to be able to go to gym; 01/28/2024:  exercises are going well per pt report Goal status: MET  2. The pt will report independence and consistency with gym program outside PT to continue making gains with strength and mobility  Baseline: pt to start going to gym soon  Goal status: NEW  LONG TERM GOALS: Target date: 04/21/2024     Patient will increase FOTO score to equal to or greater than  76  to demonstrate statistically significant improvement in mobility and quality of life.  Baseline: risk adjusted 51; 02/19/2023: 63 * pt reporting pain in left leg limiting stair climbing; 04/23/23: 81 Goal status: MET/DISCONTINUED  2.  Pt will reduce 5xsSTS time to < 8 sec to demonstrate clinically significant improvement in LE strength and balance to  reduce  risk of falls.  Baseline: previously 12.76 sec on 06/10/22, now 10 sec to RW on 10/22/2022; 02/19/2023 8.5 seconds hands-free; 04/23/23: 7 seconds; 10/29/23: 6.5 sec  goal status: MET  3.  Pt will improve 6 MWT to > 1000' with no AD and no LOB to demonstrate ability to improve safety with community distances. Baseline: was 822 ft back in August 2023, now 808 with RW, CGA; 02/19/2023 845 ft with 2VZ; 04/23/23: 820 ft 4WW; 05/28/23:  728 ft with RW, limited by LLE pain; 07/30/2023: 878 ft with RW; 10/08/23: 518 pt somewhat distracted throughout; 935 ft with no AD, CGA; 01/28/24 645 ft with RW (used as pt slightly unsteady today)  Goal status: IN PROGRESS  4.  Pt will improve 10 m gait speed with LRAD to > 1.0 m/s to demonstrate reduced risk of falls along with safe ability to complete community walking tasks Baseline: previously 0.7 m/s on 06/10/2022, now 0.77 m/s with RW; 02/19/2023: 0.82 m/s with RW; 04/23/23: 0.95 m/s with RW; 05/28/23: 0.83 m/s pain limited LLE; 10/9: 1.08 m/s with RW  Goal status: MET  5.  Patient will increase Berg Balance score by > 6 points to demonstrate decreased fall risk during functional activities.  Baseline: previously 35/56 on 03/11/22(?), test deferred to next appt; 11/13/2022: 36/56; 02/19/2023: 39/56; 04/23/23: 42/56  Goal status: MET  6.  Pt will report improved stool consistency type 4 across 50% of the time and demo less flexed coccyx across 2 weeks in order to promote bowel elimination  Baseline: Pt has Type 1 stool consistency based on Bristol stool scale 100% and her mom has to put boiled water to soften the stool to flush every time.  Coccyx flexed  Goal status: discontinued/pt not currently being seen by pelvic PT      ASSESSMENT:  CLINICAL IMPRESSION: Continued with LE stretching and PROM to improve mobility for upright activities/gait, particularly for turning activity. Pt also reported R foot and heel pain. Assessment of skin and skin temp WNL, did discuss monitoring  for improvement. Pt also responded well to STM to R foot plantar surface and gastroc stretch. The pt will benefit from further skilled PT to improve balance, gait and mobility.   OBJECTIVE IMPAIRMENTS: Abnormal gait, decreased activity tolerance, decreased balance, decreased coordination, decreased endurance, decreased knowledge of use of DME, decreased mobility, difficulty walking, decreased strength, impaired tone, improper body mechanics, postural dysfunction, and pain.   ACTIVITY LIMITATIONS: carrying, lifting, bending, standing, squatting, stairs, transfers, dressing, and locomotion level  PARTICIPATION LIMITATIONS: meal prep, cleaning, shopping, community activity, and yard work  PERSONAL FACTORS: Fitness, Sex, Time since onset of injury/illness/exacerbation, and 3+ comorbidities: PMH consists of the following: CP, depression, GAD, migraine headache, hx of LE surgery bilateral x2 in 2008  are also affecting patient's functional outcome.   REHAB POTENTIAL: Good  CLINICAL DECISION MAKING: Evolving/moderate complexity  EVALUATION COMPLEXITY: Moderate  PLAN:  PT FREQUENCY: 1-2x/week  PT DURATION: 12 weeks  PLANNED INTERVENTIONS: Therapeutic exercises, Therapeutic activity, Neuromuscular re-education, Balance training, Gait training, Patient/Family education, Self Care, Joint mobilization, Stair training, Vestibular training, Canalith repositioning, Visual/preceptual remediation/compensation, Orthotic/Fit training, DME instructions, Dry Needling, Electrical stimulation, Wheelchair mobility training, Spinal mobilization, Cryotherapy, Moist heat, Splintting, Taping, Biofeedback, Manual therapy, and Re-evaluation  PLAN FOR NEXT SESSION: gait mechanics, improve step-width, strengthening BLE, hip abduction, stretching, manual RLE; instruction Self-stretching/tone management in standing, seated stretch ankles/toes, continue plan  Samie Crews, PT 02/04/2024, 3:43 PM  3:43 PM,  02/04/24 Physical Therapist - [place]  [facility]

## 2024-02-11 ENCOUNTER — Ambulatory Visit: Payer: MEDICAID

## 2024-02-11 DIAGNOSIS — R278 Other lack of coordination: Secondary | ICD-10-CM

## 2024-02-11 DIAGNOSIS — M25661 Stiffness of right knee, not elsewhere classified: Secondary | ICD-10-CM | POA: Diagnosis not present

## 2024-02-11 DIAGNOSIS — G809 Cerebral palsy, unspecified: Secondary | ICD-10-CM

## 2024-02-11 DIAGNOSIS — M6281 Muscle weakness (generalized): Secondary | ICD-10-CM

## 2024-02-11 NOTE — Therapy (Signed)
 OUTPATIENT OCCUPATIONAL THERAPY NEURO TREATMENT NOTE  Patient Name: Renee Walton MRN: [id number] DOB:25-May-1996, 28 y.o., female Today's Date: 02/11/2024   REFERRING PROVIDER: Jerris More, MD  END OF SESSION:  OT End of Session - 02/11/24 1518     Visit Number 4    Number of Visits 12    Date for OT Re-Evaluation 03/24/24    OT Start Time 1407    OT Stop Time 1447    OT Time Calculation (min) 40 min    Activity Tolerance Patient tolerated treatment well    Behavior During Therapy WFL for tasks assessed/performed            Past Medical History:  Diagnosis Date   Breakthrough bleeding    Cerebral palsy (HCC)    GAD (generalized anxiety disorder)    Migraine headache    Past Surgical History:  Procedure Laterality Date   LEG SURGERY     bilateral x2 in 2008   Patient Active Problem List   Diagnosis Date Noted   Seasonal allergic rhinitis due to pollen 03/27/2023   Bronchitis 03/13/2023   Gastroesophageal reflux disease without esophagitis 02/24/2023   Migraine without aura and without status migrainosus, not intractable 02/24/2023   GAD (generalized anxiety disorder) 02/24/2023   Loss of weight 07/17/2015   Depression 07/17/2015   Cerebral palsy (HCC) 07/17/2015   ONSET DATE: 1995  REFERRING DIAG: CP  THERAPY DIAG:  Muscle weakness (generalized)  Other lack of coordination  Cerebral palsy, unspecified type (HCC)  Rationale for Evaluation and Treatment: Rehabilitation  SUBJECTIVE:   SUBJECTIVE STATEMENT: Pt reports that she bought a new hair tie and plans to bring it to practice next OT session, but she did not bring it today. Pt accompanied by: self  PERTINENT HISTORY: Pt. is a 28 y.o. female with a diagnosis of CP. PMHx includes: GAD, Migraine headache,GERD, bilateral LE surgery x's 2 in 2008.  PRECAUTIONS: None  WEIGHT BEARING RESTRICTIONS: No  PAIN:  Are you having pain?  No reports of pain  FALLS: Has patient fallen in last 6 months?  No  LIVING ENVIRONMENT: Lives with: lives with their family Multiple family members Lives in: House/apartment Stairs: 2 floors-stays on the main floor,  Has following equipment at home: Otho Blitz - 2 wheeled, Wheelchair (power), and Grab bars  PLOF:  Has a Nurse, adult.  PATIENT GOALS: To be able to tie her hair back, or try a new hairdo.  OBJECTIVE:  Note: Objective measures were completed at Evaluation unless otherwise noted.  HAND DOMINANCE: Left  ADLs:  Transfers/ambulation related to ADLs: Eating: Independent Grooming: Independent; assist required for hair care/ponytail UB Dressing: Independent LB Dressing: Independent, difficulty fastening pant button, soc Toileting: Mother assists  Bathing: Independent with washing, assist with drying. 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 Is working with PT  POSTURE COMMENTS:   Sitting balance: good supported  ACTIVITY TOLERANCE: Activity tolerance: Fair  FUNCTIONAL OUTCOME MEASURES: TBD  UPPER EXTREMITY ROM:    Active ROM Right Eval WFL Left Eval [facility]  Shoulder flexion    Shoulder abduction    Shoulder adduction    Shoulder extension    Shoulder internal rotation    Shoulder external  rotation    Elbow flexion    Elbow extension    Wrist flexion    Wrist extension    Wrist ulnar deviation    Wrist radial deviation    Wrist pronation    Wrist supination    (Blank rows = not tested)  UPPER EXTREMITY MMT:     MMT Right Eval WFL Left Eval [facility]  Shoulder flexion    Shoulder abduction    Shoulder adduction    Shoulder extension    Shoulder internal rotation     Shoulder external rotation    Middle trapezius    Lower trapezius    Elbow flexion    Elbow extension    Wrist flexion    Wrist extension    Wrist ulnar deviation    Wrist radial deviation    Wrist pronation    Wrist supination    (Blank rows = not tested)  HAND FUNCTION: Grip strength: Right: 13 lbs; Left: 18 lbs, Lateral pinch: Right: 3 lbs, Left: 8 lbs, and 3 point pinch: Right: 8 lbs, Left: 8 lbs  COORDINATION: 9 Hole Peg test: Right: 1 min. & 17 sec; Left: 37 sec  SENSATION: WFL light touch, and proprioceptive awareness  COGNITION: Overall cognitive status: Within functional limits for tasks assessed  VISION: Subjective report: No change form baseline   PERCEPTION: WFL  PRAXIS: TBD                                                                                                                            TREATMENT DATE: 02/11/2024  Therapeutic Activity: -Trial of adult standard scissors, pediatric scissors, and spring form scissors with practice cutting over a straight line on a folder to simulate thickness of vinyl matting which she uses at home for making tshirt designs.  -Trial of tape to secure L thumb to scissors to prevent sliding grip -Reviewed and practiced implementation of strategies to stabilize paper and LUE while cutting, including WB forearm and elbow on table top and frequently repositioning R hand on paper as pt cuts further away from body -Practiced use of Klask board to simulate pushing electric scissors (pt plans to purchase electric scissors) along a straight line (used magnet to slide across edge of paper on [employer]) -Reviewed strategies to locate midline of tshirt for applying cut out designs  PATIENT EDUCATION: Education details: Encouraged pt to bring in her supplies (vinyl pieces/electric scissors, hair ties) to limit need for simulation  Person educated: Patient Education method: Explanation Education comprehension: verbalized  understanding, receptive to bringing in her supplies  HOME EXERCISE PROGRAM: Continue to assess, and provided as indicated  GOALS: Goals reviewed with patient? Yes  SHORT TERM GOALS: Target date: 02/11/2024   Pt. Will be independent with BUE HEP programs. Baseline:EVal: No current HEP. Goal status: INITIAL  LONG TERM GOALS: Target date: 03/24/2024    Pt. Will independently be able to use scissors to cut a straight line. Baseline: Eval: Pt.  Has difficulty using scissors to cut a straight line. Goal status: INITIAL  2.  Pt. Will fix her hair in an updo with modified independence Baseline: Eval: Pt. Is unable to tie her hair into a ponytail. Goal status: INITIAL  3.  Pt. Will independently perform nailcare. Baseline: Eval: Pt. has difficulty completing nail care Goal status: INITIAL  4.  Pt. Will independently, and accurately locate the middle, and center shirts for screen printing. Baseline: Eval: Pt. Has difficulty with locating the middle, and centering shirts for screen printing Goal status: INITIAL  5.  Pt. Will demonstrate compensatory strategies for home management tasks. Baseline: Eval: Education to be provided about compensatory strategies Goal status: INITIAL  6. Pt. Will improve right hand [facility] skills by 5 sec. of speed to be able to fasten her pants  Baseline: Eval: R: 1 min. & 17 sec. L: 37 sec. Pt. Has difficulty fastening her pants.   ASSESSMENT:  CLINICAL IMPRESSION: After trial of a variety of scissors, pt found spring form scissors to be most comfortable, but still struggled to consistently cut on a straight line, despite modified positioning strategies.  Pt also struggled to maintain grasp of all scissor types, but reports that she does plan to purchase some electric scissors which she would be able to just hold and push across her vinyl pieces and adjust the speed as needed.  Simulated GM skills needed to push electric scissors in a straight line using Klask  board; pt required frequent vc for maintaining attention to task and did deviate from a straight line often by ~ 1/2" or Walton, but improved with vc to slow pace.  Pt plans to bring in electric scissors to practice once purchased, as well as her new hair tie to practice putting hair in ponytail.  Pt. continues to benefit from OT services to work on improving functional performance with ADLs, and IADL functioning, maximizing independence, and decreasing dependence on caregivers.   PERFORMANCE DEFICITS: in functional skills including ADLs, IADLs, coordination, dexterity, strength, Fine motor control, Gross motor control, decreased knowledge of use of DME, and UE functional use, cognitive skills including , and psychosocial skills including coping strategies, environmental adaptation, and routines and behaviors.   IMPAIRMENTS: are limiting patient from ADLs, IADLs, and leisure.   CO-MORBIDITIES: may have co-morbidities  that affects occupational performance. Patient will benefit from skilled OT to address above impairments and improve overall function.  MODIFICATION OR ASSISTANCE TO COMPLETE EVALUATION: Min-Moderate modification of tasks or assist with assess necessary to complete an evaluation.  OT OCCUPATIONAL PROFILE AND HISTORY: Detailed assessment: Review of records and additional review of physical, cognitive, psychosocial history related to current functional performance.  CLINICAL DECISION MAKING: Moderate - several treatment options, min-mod task modification necessary  REHAB POTENTIAL: Good  EVALUATION COMPLEXITY: Moderate    PLAN:  OT FREQUENCY: 1x/week  OT DURATION: 12 weeks  PLANNED INTERVENTIONS: 97168 OT Re-evaluation, 97535 self care/ADL training, 14782 therapeutic exercise, 97530 therapeutic activity, 97112 neuromuscular re-education, 97140 manual therapy, 97018 paraffin, 95621 moist heat, 97034 contrast bath, 97760 Orthotics management and training, 30865 Splinting (initial  encounter), functional mobility training, visual/perceptual remediation/compensation, patient/family education, and DME and/or AE instructions  RECOMMENDED OTHER SERVICES: PT  CONSULTED AND AGREED WITH PLAN OF CARE: Patient  PLAN FOR NEXT SESSION: Treatment  Marcus Sewer, MS, OTR/L  02/11/2024, 3:19 PM

## 2024-02-18 ENCOUNTER — Ambulatory Visit: Payer: MEDICAID

## 2024-02-23 ENCOUNTER — Ambulatory Visit: Payer: MEDICAID | Admitting: Internal Medicine

## 2024-02-25 ENCOUNTER — Ambulatory Visit: Payer: MEDICAID | Attending: Internal Medicine

## 2024-02-25 ENCOUNTER — Ambulatory Visit: Payer: MEDICAID

## 2024-02-25 DIAGNOSIS — M6281 Muscle weakness (generalized): Secondary | ICD-10-CM | POA: Diagnosis present

## 2024-02-25 DIAGNOSIS — M25662 Stiffness of left knee, not elsewhere classified: Secondary | ICD-10-CM | POA: Diagnosis present

## 2024-02-25 DIAGNOSIS — M25661 Stiffness of right knee, not elsewhere classified: Secondary | ICD-10-CM | POA: Insufficient documentation

## 2024-02-25 DIAGNOSIS — R278 Other lack of coordination: Secondary | ICD-10-CM | POA: Diagnosis present

## 2024-02-25 DIAGNOSIS — G809 Cerebral palsy, unspecified: Secondary | ICD-10-CM | POA: Diagnosis present

## 2024-02-25 DIAGNOSIS — R262 Difficulty in walking, not elsewhere classified: Secondary | ICD-10-CM | POA: Diagnosis present

## 2024-02-27 NOTE — Therapy (Signed)
 OUTPATIENT OCCUPATIONAL THERAPY NEURO TREATMENT NOTE  Patient Name: Renee Walton MRN: [id number] DOB:[birth year], 28 y.o., female Today's Date: 02/27/2024   REFERRING PROVIDER: Jerris More, MD  END OF SESSION:  OT End of Session - 02/27/24 0819     Visit Number 5    Number of Visits 12    Date for OT Re-Evaluation 03/24/24    OT Start Time 1400    OT Stop Time 1445    OT Time Calculation (min) 45 min    Activity Tolerance Patient tolerated treatment well    Behavior During Therapy WFL for tasks assessed/performed            Past Medical History:  Diagnosis Date   Breakthrough bleeding    Cerebral palsy (HCC)    GAD (generalized anxiety disorder)    Migraine headache    Past Surgical History:  Procedure Laterality Date   LEG SURGERY     bilateral x2 in 2008   Patient Active Problem List   Diagnosis Date Noted   Seasonal allergic rhinitis due to pollen 03/27/2023   Bronchitis 03/13/2023   Gastroesophageal reflux disease without esophagitis 02/24/2023   Migraine without aura and without status migrainosus, not intractable 02/24/2023   GAD (generalized anxiety disorder) 02/24/2023   Loss of weight 07/17/2015   Depression 07/17/2015   Cerebral palsy (HCC) 07/17/2015   ONSET DATE: 1995  REFERRING DIAG: CP  THERAPY DIAG:  Muscle weakness (generalized)  Other lack of coordination  Cerebral palsy, unspecified type (HCC)  Rationale for Evaluation and Treatment: Rehabilitation  SUBJECTIVE:  SUBJECTIVE STATEMENT: Pt brought in her new scrunchie/hair tie to practice putting hair in ponytail. Pt accompanied by: self  PERTINENT HISTORY: Pt. is a 28 y.o. female with a diagnosis of CP. PMHx includes: GAD, Migraine headache,GERD, bilateral LE surgery x's 2 in 2008.  PRECAUTIONS: None  WEIGHT BEARING RESTRICTIONS: No  PAIN:  Are you having pain?  No reports of pain  FALLS: Has patient fallen in last 6 months? No  LIVING ENVIRONMENT: Lives with: lives with  their family Multiple family members Lives in: House/apartment Stairs: 2 floors-stays on the main floor,  Has following equipment at home: Otho Blitz - 2 wheeled, Wheelchair (power), and Grab bars  PLOF:  Has a Nurse, adult.  PATIENT GOALS: To be able to tie her hair back, or try a new hairdo.  OBJECTIVE:  Note: Objective measures were completed at Evaluation unless otherwise noted.  HAND DOMINANCE: Left  ADLs:  Transfers/ambulation related to ADLs: Eating: Independent Grooming: Independent; assist required for hair care/ponytail UB Dressing: Independent LB Dressing: Independent, difficulty fastening pant button, soc Toileting: Mother assists  Bathing: Independent with washing, assist with drying. 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 Is working with PT  POSTURE COMMENTS:   Sitting balance: good supported  ACTIVITY TOLERANCE: Activity tolerance: Fair  FUNCTIONAL OUTCOME MEASURES: TBD  UPPER EXTREMITY ROM:    Active ROM Right Eval WFL Left Eval [facility]  Shoulder flexion    Shoulder abduction    Shoulder adduction    Shoulder extension    Shoulder internal rotation    Shoulder external rotation    Elbow flexion    Elbow extension    Wrist  flexion    Wrist extension    Wrist ulnar deviation    Wrist radial deviation    Wrist pronation    Wrist supination    (Blank rows = not tested)  UPPER EXTREMITY MMT:     MMT Right Eval WFL Left Eval [facility]  Shoulder flexion    Shoulder abduction    Shoulder adduction    Shoulder extension    Shoulder internal rotation    Shoulder external rotation    Middle trapezius     Lower trapezius    Elbow flexion    Elbow extension    Wrist flexion    Wrist extension    Wrist ulnar deviation    Wrist radial deviation    Wrist pronation    Wrist supination    (Blank rows = not tested)  HAND FUNCTION: Grip strength: Right: 13 lbs; Left: 18 lbs, Lateral pinch: Right: 3 lbs, Left: 8 lbs, and 3 point pinch: Right: 8 lbs, Left: 8 lbs  COORDINATION: 9 Hole Peg test: Right: 1 min. & 17 sec; Left: 37 sec  SENSATION: WFL light touch, and proprioceptive awareness  COGNITION: Overall cognitive status: Within functional limits for tasks assessed  VISION: Subjective report: No change form baseline   PERCEPTION: WFL  PRAXIS: TBD                                                                                                                      TREATMENT DATE: 02/26/2024  Self Care: -Participation in practice trials with putting hair up in ponytail.  Mirror used for visual feedback.   -Vc provided for sequencing to maximize bimanual technique and optimizing neatness when gathering hair -Vc for positioning R elbow on table top for stability when reaching to back of head  -Initiated hair braiding techniques per pt request; max multimodal cuing for sequencing and coordination when practicing with 3 strands of theraband and 3 strands of rope anchored to table top.  PATIENT EDUCATION: Education details: hair care strategies Person educated: Patient Education method: Explanation, demo Education comprehension: verbalized understanding, demonstrated understanding with multimodal cuing  HOME EXERCISE PROGRAM: Continue to assess, and provided as indicated  GOALS: Goals reviewed with patient? Yes  SHORT TERM GOALS: Target date: 02/11/2024   Pt. Will be independent with BUE HEP programs. Baseline:EVal: No current HEP. Goal status: INITIAL  LONG TERM GOALS: Target date: 03/24/2024    Pt. Will independently be able to use scissors to cut a straight line. Baseline:  Eval: Pt. Has difficulty using scissors to cut a straight line. Goal status: INITIAL  2.  Pt. Will fix her hair in an updo with modified independence Baseline: Eval: Pt. Is unable to tie her hair into a ponytail. Goal status: INITIAL  3.  Pt. Will independently perform nailcare. Baseline: Eval: Pt. has difficulty completing nail care Goal status: INITIAL  4.  Pt. Will independently, and accurately locate the middle, and center shirts for screen printing. Baseline: Eval: Pt. Has difficulty with locating the  middle, and centering shirts for screen printing Goal status: INITIAL  5.  Pt. Will demonstrate compensatory strategies for home management tasks. Baseline: Eval: Education to be provided about compensatory strategies Goal status: INITIAL  6. Pt. Will improve right hand [facility] skills by 5 sec. of speed to be able to fasten her pants  Baseline: Eval: R: 1 min. & 17 sec. L: 37 sec. Pt. Has difficulty fastening her pants.   ASSESSMENT:  CLINICAL IMPRESSION: Pt able to successfully place her hair in her new adapted ponytail holder x3 today with multimodal cues for sequencing and technique.  OT encouraged pt continue to practice these strategies at home for improving independence and efficiency.  Pt typically has mother braid her hair daily.  Hair braiding technique initiated today per pt request, with pt requiring max vc/tactile cues for implementation braiding theraband and rope on table top.  Pt struggled with sequencing and manipulating 3 strands at once, and reported that she found an electric hair braider online, and reiterated her love for technology.  Will continue to assess optimal strategies for pt to maximize indep and efficiency with more independent hair care.  Pt. continues to benefit from OT services to work on improving functional performance with ADLs, and IADL functioning, maximizing independence, and decreasing dependence on caregivers.   PERFORMANCE DEFICITS: in functional  skills including ADLs, IADLs, coordination, dexterity, strength, Fine motor control, Gross motor control, decreased knowledge of use of DME, and UE functional use, cognitive skills including , and psychosocial skills including coping strategies, environmental adaptation, and routines and behaviors.   IMPAIRMENTS: are limiting patient from ADLs, IADLs, and leisure.   CO-MORBIDITIES: may have co-morbidities  that affects occupational performance. Patient will benefit from skilled OT to address above impairments and improve overall function.  MODIFICATION OR ASSISTANCE TO COMPLETE EVALUATION: Min-Moderate modification of tasks or assist with assess necessary to complete an evaluation.  OT OCCUPATIONAL PROFILE AND HISTORY: Detailed assessment: Review of records and additional review of physical, cognitive, psychosocial history related to current functional performance.  CLINICAL DECISION MAKING: Moderate - several treatment options, min-mod task modification necessary  REHAB POTENTIAL: Good  EVALUATION COMPLEXITY: Moderate    PLAN:  OT FREQUENCY: 1x/week  OT DURATION: 12 weeks  PLANNED INTERVENTIONS: 97168 OT Re-evaluation, 97535 self care/ADL training, 40981 therapeutic exercise, 97530 therapeutic activity, 97112 neuromuscular re-education, 97140 manual therapy, 97018 paraffin, 19147 moist heat, 97034 contrast bath, 97760 Orthotics management and training, 82956 Splinting (initial encounter), functional mobility training, visual/perceptual remediation/compensation, patient/family education, and DME and/or AE instructions  RECOMMENDED OTHER SERVICES: PT  CONSULTED AND AGREED WITH PLAN OF CARE: Patient  PLAN FOR NEXT SESSION: Treatment  Marcus Sewer, MS, OTR/L  02/27/2024, 8:20 AM

## 2024-03-02 ENCOUNTER — Ambulatory Visit: Payer: MEDICAID | Attending: Physical Therapy | Admitting: Physical Therapy

## 2024-03-03 ENCOUNTER — Ambulatory Visit: Payer: MEDICAID

## 2024-03-03 DIAGNOSIS — R278 Other lack of coordination: Secondary | ICD-10-CM

## 2024-03-03 DIAGNOSIS — R262 Difficulty in walking, not elsewhere classified: Secondary | ICD-10-CM

## 2024-03-03 DIAGNOSIS — M25662 Stiffness of left knee, not elsewhere classified: Secondary | ICD-10-CM

## 2024-03-03 DIAGNOSIS — M6281 Muscle weakness (generalized): Secondary | ICD-10-CM | POA: Diagnosis not present

## 2024-03-03 DIAGNOSIS — G809 Cerebral palsy, unspecified: Secondary | ICD-10-CM

## 2024-03-03 DIAGNOSIS — M25661 Stiffness of right knee, not elsewhere classified: Secondary | ICD-10-CM

## 2024-03-03 NOTE — Therapy (Signed)
 OUTPATIENT PHYSICAL THERAPY NEURO TREATMENT NOTE   Patient Name: Renee Walton MRN: [id number] DOB:11/13/1995, 28 y.o., female Today's Date: 03/03/2024   PCP: Aisha Hove, MD REFERRING PROVIDER:   Aisha Hove, MD    END OF SESSION:  PT End of Session - 03/03/24 1523     Visit Number [id number]    Number of Visits 47    Date for PT Re-Evaluation 04/21/24    Authorization Type Medicaid [place]    Authorization Time Period 02/25/22-06/02/22 for 12 visits    PT Start Time 1523    PT Stop Time 1606    PT Time Calculation (min) 43 min    Equipment Utilized During Treatment Gait belt    Activity Tolerance Patient tolerated treatment well    Behavior During Therapy WFL for tasks assessed/performed                         Past Medical History:  Diagnosis Date   Breakthrough bleeding    Cerebral palsy (HCC)    GAD (generalized anxiety disorder)    Migraine headache    Past Surgical History:  Procedure Laterality Date   LEG SURGERY     bilateral x2 in 2008   Patient Active Problem List   Diagnosis Date Noted   Seasonal allergic rhinitis due to pollen 03/27/2023   Bronchitis 03/13/2023   Gastroesophageal reflux disease without esophagitis 02/24/2023   Migraine without aura and without status migrainosus, not intractable 02/24/2023   GAD (generalized anxiety disorder) 02/24/2023   Loss of weight 07/17/2015   Depression 07/17/2015   Cerebral palsy (HCC) 07/17/2015    ONSET DATE: 1994[birth year] pt has CP  REFERRING DIAG:   G80.9 (ICD-10-CM) - Cerebral palsy (HCC)    THERAPY DIAG:  Joint stiffness of right lower leg  Joint stiffness of left lower leg  Difficulty in walking, not elsewhere classified  Other lack of coordination  Muscle weakness (generalized)  Cerebral palsy, unspecified type (HCC)  Rationale for Evaluation and Treatment: Rehabilitation  SUBJECTIVE:                                                                                                                                                                                              SUBJECTIVE STATEMENT   Pt had botox in BLE recently. Doing well, no other updates. Pt accompanied by: self, mother  PERTINENT HISTORY:    Pt is a pleasant 28 y/o female with CP who is returning to PT after absence and trip to [place]. She would like to improve her mobility, gait and balance. Pt has continued  her PT exercises while away and worked on gait, and wants to become independent using a RW. She reports 2 recent falls, and no injuries. Pt also with stiffness in BLEs. PMH consists of the following: CP, depression, GAD, migraine headache, hx of LE surgery bilateral x2 in 2008.    PAIN: from eval Are you having pain? No  PRECAUTIONS: Fall  WEIGHT BEARING RESTRICTIONS: No  FALLS: Has patient fallen in last 6 months? Yes. Number of falls 2 when walking. She reports no injury. She got back up independently   PATIENT GOALS: Pt would like to improve her ability to ambulate with RW both inside and over outside surfaces in order to increase her independence with mobility. She would like to learn how to safely get in and out of a car with her RW.  OBJECTIVE:   Today's Treatment: 03/03/24 Gait belt donned and CGA provided for upright activities unless otherwise noted  [employer]: On large mat table: Sustained/prolonged hamstring stretch performed each LE Knee to chest 10x each LE 5x each LE  NMR: In // bars FWD gait with turns without UE support, approach bolster for obstacle clearance/step-over using UE support x multiple reps, occ difficulty clearing LE. LTL stepping in // bars with UUE support 4x length of bars  TA: Self-stretch/strength: Pt holding LE hip 90 deg flexion and tries to straighten out LE at knee (knee ext) as much as she can 15x each side  TrA activation with hamstring curl with green pball, glute bridge with BLE on green pball x multiple reps Standing self-gastroc  stretch supporting foot on half bolster, BUE support on bars 2x30 sec each LE - cuing throughout for technique    PATIENT EDUCATION: Education details: Pt educated throughout session about proper posture and technique with exercises. Improved exercise technique, movement at target joints, use of target muscles after min to mod verbal, visual, tactile cues.  Technique with self-stretching  Person educated: Patient Education method: Explanation, Demonstration, and Verbal cues Education comprehension: verbalized understanding and returned demonstration  HOME EXERCISE PROGRAM See pt instruction section   GOALS: Goals reviewed with patient? Yes  SHORT TERM GOALS: Target date: 03/10/2024    Patient will be independent in home exercise program to improve strength/mobility for better functional independence with ADLs. Baseline: to be initiated next 1-2 visits; 02/19/2023: pt not consistently performing HEP; 04/23/23:  Pt reports she goes to gym every three days; 8/7: Pt reports she has been increasing her activity levels, but currently waiting for caregiver to start to be able to go to gym; 01/28/2024:  exercises are going well per pt report Goal status: MET  2. The pt will report independence and consistency with gym program outside PT to continue making gains with strength and mobility  Baseline: pt to start going to gym soon  Goal status: NEW  LONG TERM GOALS: Target date: 04/21/2024     Patient will increase FOTO score to equal to or greater than  76  to demonstrate statistically significant improvement in mobility and quality of life.  Baseline: risk adjusted 51; 02/19/2023: 63 * pt reporting pain in left leg limiting stair climbing; 04/23/23: 81 Goal status: MET/DISCONTINUED  2.  Pt will reduce 5xsSTS time to < 8 sec to demonstrate clinically significant improvement in LE strength and balance to reduce risk of falls.  Baseline: previously 12.76 sec on 06/10/22, now 10 sec to RW on 10/22/2022;  02/19/2023 8.5 seconds hands-free; 04/23/23: 7 seconds; 10/29/23: 6.5 sec  goal status: MET  3.  Pt will improve 6 MWT to > 1000' with no AD and no LOB to demonstrate ability to improve safety with community distances. Baseline: was 822 ft back in August 2023, now 808 with RW, CGA; 02/19/2023 845 ft with 1OX; 04/23/23: 820 ft 4WW; 05/28/23:  728 ft with RW, limited by LLE pain; 07/30/2023: 878 ft with RW; 10/08/23: 518 pt somewhat distracted throughout; 935 ft with no AD, CGA; 01/28/24 645 ft with RW (used as pt slightly unsteady today)  Goal status: IN PROGRESS  4.  Pt will improve 10 m gait speed with LRAD to > 1.0 m/s to demonstrate reduced risk of falls along with safe ability to complete community walking tasks Baseline: previously 0.7 m/s on 06/10/2022, now 0.77 m/s with RW; 02/19/2023: 0.82 m/s with RW; 04/23/23: 0.95 m/s with RW; 05/28/23: 0.83 m/s pain limited LLE; 10/9: 1.08 m/s with RW  Goal status: MET  5.  Patient will increase Berg Balance score by > 6 points to demonstrate decreased fall risk during functional activities.  Baseline: previously 35/56 on 03/11/22(?), test deferred to next appt; 11/13/2022: 36/56; 02/19/2023: 39/56; 04/23/23: 42/56  Goal status: MET  6.  Pt will report improved stool consistency type 4 across 50% of the time and demo less flexed coccyx across 2 weeks in order to promote bowel elimination  Baseline: Pt has Type 1 stool consistency based on Bristol stool scale 100% and her mom has to put boiled water to soften the stool to flush every time.  Coccyx flexed  Goal status: discontinued/pt not currently being seen by pelvic PT      ASSESSMENT:  CLINICAL IMPRESSION: Pt returns having recently received botox injections bilt LE. PT instructed pt in intervention she can perform independently for strengthening and self-stretching. Pt able to demo correct technique following cues in session. Plan to provide review/further instruction to prepare for pt to complete these independently at  home. The pt will benefit from further skilled PT to improve balance, gait and mobility.   OBJECTIVE IMPAIRMENTS: Abnormal gait, decreased activity tolerance, decreased balance, decreased coordination, decreased endurance, decreased knowledge of use of DME, decreased mobility, difficulty walking, decreased strength, impaired tone, improper body mechanics, postural dysfunction, and pain.   ACTIVITY LIMITATIONS: carrying, lifting, bending, standing, squatting, stairs, transfers, dressing, and locomotion level  PARTICIPATION LIMITATIONS: meal prep, cleaning, shopping, community activity, and yard work  PERSONAL FACTORS: Fitness, Sex, Time since onset of injury/illness/exacerbation, and 3+ comorbidities: PMH consists of the following: CP, depression, GAD, migraine headache, hx of LE surgery bilateral x2 in 2008 are also affecting patient's functional outcome.   REHAB POTENTIAL: Good  CLINICAL DECISION MAKING: Evolving/moderate complexity  EVALUATION COMPLEXITY: Moderate  PLAN:  PT FREQUENCY: 1-2x/week  PT DURATION: 12 weeks  PLANNED INTERVENTIONS: Therapeutic exercises, Therapeutic activity, Neuromuscular re-education, Balance training, Gait training, Patient/Family education, Self Care, Joint mobilization, Stair training, Vestibular training, Canalith repositioning, Visual/preceptual remediation/compensation, Orthotic/Fit training, DME instructions, Dry Needling, Electrical stimulation, Wheelchair mobility training, Spinal mobilization, Cryotherapy, Moist heat, Splintting, Taping, Biofeedback, Manual therapy, and Re-evaluation  PLAN FOR NEXT SESSION: gait mechanics, improve step-width, strengthening BLE, hip abduction, stretching, manual RLE; instruction Self-stretching/tone management in standing, seated stretch ankles/toes, continue plan  Samie Crews, PT 03/03/2024, 4:10 PM  4:10 PM, 03/03/24 Physical Therapist - [facility]

## 2024-03-04 ENCOUNTER — Telehealth: Payer: Self-pay | Admitting: Internal Medicine

## 2024-03-04 NOTE — Telephone Encounter (Signed)
Patient left VM requesting a call back. Did not state what she needs.

## 2024-03-09 ENCOUNTER — Encounter: Payer: Self-pay | Admitting: Internal Medicine

## 2024-03-09 ENCOUNTER — Ambulatory Visit: Payer: MEDICAID | Admitting: Internal Medicine

## 2024-03-09 VITALS — BP 120/64 | HR 60 | Ht 62.0 in | Wt 88.6 lb

## 2024-03-09 DIAGNOSIS — G801 Spastic diplegic cerebral palsy: Secondary | ICD-10-CM

## 2024-03-09 DIAGNOSIS — Z0130 Encounter for examination of blood pressure without abnormal findings: Secondary | ICD-10-CM

## 2024-03-09 NOTE — Progress Notes (Signed)
 Established Patient Office Visit  Subjective:  Patient ID: Renee Walton, female    DOB: [birth year]  Age: 28 y.o. MRN: [id number]  Chief Complaint  Patient presents with   Follow-up    Follow up [facility] paperwork    Patient here to get [facility] paperwork completed.    No other concerns at this time.   Past Medical History:  Diagnosis Date   Breakthrough bleeding    Cerebral palsy (HCC)    GAD (generalized anxiety disorder)    Migraine headache     Past Surgical History:  Procedure Laterality Date   LEG SURGERY     bilateral x2 in 2008    Social History   Socioeconomic History   Marital status: Single    Spouse name: Not on file   Number of children: Not on file   Years of education: Not on file   Highest education level: Not on file  Occupational History   Not on file  Tobacco Use   Smoking status: Never   Smokeless tobacco: Never  Vaping Use   Vaping status: Never Used  Substance and Sexual Activity   Alcohol use: No    Alcohol/week: 0.0 standard drinks of alcohol   Drug use: No   Sexual activity: Never  Other Topics Concern   Not on file  Social History Narrative   ** Merged History Encounter **       Social Drivers of Corporate investment banker Strain: Not on file  Food Insecurity: Not on file  Transportation Needs: Not on file  Physical Activity: Not on file  Stress: Not on file  Social Connections: Not on file  Intimate Partner Violence: Not on file    Family History  Problem Relation Age of Onset   Stroke Other        grandparent   Hypertension Other        parent   Mental illness Other        parent   Diabetes Other        parent, grandparent    Allergies  Allergen Reactions   Cat Dander    Cat Dander Itching    Outpatient Medications Prior to Visit  Medication Sig   baclofen (LIORESAL) 10 MG tablet TAKE ONE TABLET BY MOUTH 3 TIMES DAILY   benzonatate  (TESSALON ) 100 MG capsule TAKE 1 CAPSULE BY MOUTH 3 TIMES DAILY ASNEEDED FOR  COUGH (Patient not taking: Reported on 02/03/2024)   clotrimazole -betamethasone  (LOTRISONE ) cream Apply to affected area 2 times daily until the rash has resolved and then for 3 additional days. (Patient not taking: Reported on 02/03/2024)   escitalopram (LEXAPRO) 5 MG tablet Take 5 mg by mouth daily.   gabapentin (NEURONTIN) 100 MG capsule TAKE 1 CAPSULE BY MOUTH AT BEDTIME   LO LOESTRIN FE 1 MG-10 MCG / 10 MCG tablet TAKE AS DIRECTED (Patient not taking: Reported on 02/03/2024)   meloxicam (MOBIC) 15 MG tablet TAKE 1 TABLET BY MOUTH DAILY AS NEEDED   Multiple Vitamins-Minerals (CENTRUM SILVER ULTRA WOMENS PO) Take by mouth.   omeprazole  (PRILOSEC) 40 MG capsule Take 1 capsule (40 mg total) by mouth daily.   ondansetron  (ZOFRAN ) 4 MG tablet TAKE ONE TABLET BY MOUTH EVERY 4 TO 6 HOURS AS NEEDED   senna-docusate (SENOKOT-S) 8.6-50 MG tablet Take by mouth. (Patient not taking: Reported on 02/03/2024)   SUMAtriptan (IMITREX) 50 MG tablet USE AS DIRECTED (MAX OF 200 MG IN 24 HOURS)   Vitamin D , Ergocalciferol , (DRISDOL ) 1.25  MG (50000 UNIT) CAPS capsule Take 1 capsule (50,000 Units total) by mouth every 7 (seven) days.   No facility-administered medications prior to visit.    ROS     Objective:   BP 120/64   Pulse 60   Ht 5\' 2"  (1.575 m)   Wt 88 lb 9.6 oz (40.2 kg)   SpO2 99%   BMI 16.21 kg/m   Vitals:   03/09/24 1313  BP: 120/64  Pulse: 60  Height: 5\' 2"  (1.575 m)  Weight: 88 lb 9.6 oz (40.2 kg)  SpO2: 99%  BMI (Calculated): 16.2    Physical Exam   No results found for any visits on 03/09/24.  No results found for this or any previous visit (from the past 2160 hours).    Assessment & Plan:  FL2 to be completed. Problem List Items Addressed This Visit   None   No follow-ups on file.   Total time spent: 10 minutes  Aisha Hove, MD  03/09/2024   This document may have been prepared by [facility] Voice Recognition software and as such may include unintentional  dictation errors.

## 2024-03-10 ENCOUNTER — Ambulatory Visit: Payer: MEDICAID

## 2024-03-17 ENCOUNTER — Ambulatory Visit: Payer: MEDICAID

## 2024-03-17 DIAGNOSIS — M25662 Stiffness of left knee, not elsewhere classified: Secondary | ICD-10-CM

## 2024-03-17 DIAGNOSIS — R262 Difficulty in walking, not elsewhere classified: Secondary | ICD-10-CM

## 2024-03-17 DIAGNOSIS — M25661 Stiffness of right knee, not elsewhere classified: Secondary | ICD-10-CM

## 2024-03-17 DIAGNOSIS — R278 Other lack of coordination: Secondary | ICD-10-CM

## 2024-03-17 DIAGNOSIS — M6281 Muscle weakness (generalized): Secondary | ICD-10-CM

## 2024-03-17 NOTE — Therapy (Signed)
 OUTPATIENT PHYSICAL THERAPY NEURO TREATMENT NOTE   Patient Name: Kahlia Lagunes MRN: [id number] DOB:20-Nov-1995, 28 y.o., female Today's Date: 03/17/2024   PCP: Aisha Hove, MD REFERRING PROVIDER:   Aisha Hove, MD    END OF SESSION:  PT End of Session - 03/17/24 1435     Visit Number 38    Number of Visits 47    Date for PT Re-Evaluation 04/21/24    Authorization Type Medicaid [place] Access    Authorization Time Period 02/25/22-06/02/22 for 12 visits    PT Start Time 1445    PT Stop Time 1529    PT Time Calculation (min) 44 min    Equipment Utilized During Treatment Gait belt    Activity Tolerance Patient tolerated treatment well    Behavior During Therapy WFL for tasks assessed/performed                         Past Medical History:  Diagnosis Date   Breakthrough bleeding    Cerebral palsy (HCC)    GAD (generalized anxiety disorder)    Migraine headache    Past Surgical History:  Procedure Laterality Date   LEG SURGERY     bilateral x2 in 2008   Patient Active Problem List   Diagnosis Date Noted   Seasonal allergic rhinitis due to pollen 03/27/2023   Bronchitis 03/13/2023   Gastroesophageal reflux disease without esophagitis 02/24/2023   Migraine without aura and without status migrainosus, not intractable 02/24/2023   GAD (generalized anxiety disorder) 02/24/2023   Loss of weight 07/17/2015   Depression 07/17/2015   Cerebral palsy (HCC) 07/17/2015    ONSET DATE: 07-Nov-1995 pt has CP  REFERRING DIAG:   G80.9 (ICD-10-CM) - Cerebral palsy (HCC)    THERAPY DIAG:  Joint stiffness of right lower leg  Joint stiffness of left lower leg  Difficulty in walking, not elsewhere classified  Other lack of coordination  Muscle weakness (generalized)  Rationale for Evaluation and Treatment: Rehabilitation  SUBJECTIVE:                                                                                                                                                                                              SUBJECTIVE STATEMENT   Pt had botox in BLE recently. Doing well, no other updates. Pt accompanied by: self, mother  PERTINENT HISTORY:    Pt is a pleasant 28 y/o female with CP who is returning to PT after absence and trip to [place]. She would like to improve her mobility, gait and balance. Pt has continued her PT exercises while away and  worked on gait, and wants to become independent using a RW. She reports 2 recent falls, and no injuries. Pt also with stiffness in BLEs. PMH consists of the following: CP, depression, GAD, migraine headache, hx of LE surgery bilateral x2 in 2008.    PAIN: from eval Are you having pain? No  PRECAUTIONS: Fall  WEIGHT BEARING RESTRICTIONS: No  FALLS: Has patient fallen in last 6 months? Yes. Number of falls 2 when walking. She reports no injury. She got back up independently   PATIENT GOALS: Pt would like to improve her ability to ambulate with RW both inside and over outside surfaces in order to increase her independence with mobility. She would like to learn how to safely get in and out of a car with her RW.  OBJECTIVE:   Today's Treatment: 03/17/24 Gait belt donned and CGA provided for upright activities unless otherwise noted    TA: In // bars High knee march in // bars 4x length of // bars ; significant L foot eversion noted LTL stepping in // bars with UUE support 4x length of bars Forward/backwards walking with 2x4 between feet 6x length 6" step toe taps to 15x each LE onto target Squat down and pick up ball and toss into hoop, foot placed between feet to decrease bilateral eversion and heels touching position; x25 10x STS no UE support Opposite UE/LE raises; Left leg is more challenging than RLE 10x    PATIENT EDUCATION: Education details: Pt educated throughout session about proper posture and technique with exercises. Improved exercise technique, movement at target  joints, use of target muscles after min to mod verbal, visual, tactile cues.  Technique with self-stretching  Person educated: Patient Education method: Explanation, Demonstration, and Verbal cues Education comprehension: verbalized understanding and returned demonstration  HOME EXERCISE PROGRAM See pt instruction section   GOALS: Goals reviewed with patient? Yes  SHORT TERM GOALS: Target date: 03/10/2024    Patient will be independent in home exercise program to improve strength/mobility for better functional independence with ADLs. Baseline: to be initiated next 1-2 visits; 02/19/2023: pt not consistently performing HEP; 04/23/23:  Pt reports she goes to gym every three days; 8/7: Pt reports she has been increasing her activity levels, but currently waiting for caregiver to start to be able to go to gym; 01/28/2024:  exercises are going well per pt report Goal status: MET  2. The pt will report independence and consistency with gym program outside PT to continue making gains with strength and mobility  Baseline: pt to start going to gym soon  Goal status: NEW  LONG TERM GOALS: Target date: 04/21/2024     Patient will increase FOTO score to equal to or greater than  76  to demonstrate statistically significant improvement in mobility and quality of life.  Baseline: risk adjusted 51; 02/19/2023: 63 * pt reporting pain in left leg limiting stair climbing; 04/23/23: 81 Goal status: MET/DISCONTINUED  2.  Pt will reduce 5xsSTS time to < 8 sec to demonstrate clinically significant improvement in LE strength and balance to reduce risk of falls.  Baseline: previously 12.76 sec on 06/10/22, now 10 sec to RW on 10/22/2022; 02/19/2023 8.5 seconds hands-free; 04/23/23: 7 seconds; 10/29/23: 6.5 sec  goal status: MET  3.  Pt will improve 6 MWT to > 1000' with no AD and no LOB to demonstrate ability to improve safety with community distances. Baseline: was 822 ft back in August 2023, now 808 with RW, CGA; 02/19/2023  845 ft  with 1OX; 04/23/23: 820 ft 4WW; 05/28/23:  728 ft with RW, limited by LLE pain; 07/30/2023: 878 ft with RW; 10/08/23: 518 pt somewhat distracted throughout; 935 ft with no AD, CGA; 01/28/24 645 ft with RW (used as pt slightly unsteady today)  Goal status: IN PROGRESS  4.  Pt will improve 10 m gait speed with LRAD to > 1.0 m/s to demonstrate reduced risk of falls along with safe ability to complete community walking tasks Baseline: previously 0.7 m/s on 06/10/2022, now 0.77 m/s with RW; 02/19/2023: 0.82 m/s with RW; 04/23/23: 0.95 m/s with RW; 05/28/23: 0.83 m/s pain limited LLE; 10/9: 1.08 m/s with RW  Goal status: MET  5.  Patient will increase Berg Balance score by > 6 points to demonstrate decreased fall risk during functional activities.  Baseline: previously 35/56 on 03/11/22(?), test deferred to next appt; 11/13/2022: 36/56; 02/19/2023: 39/56; 04/23/23: 42/56  Goal status: MET  6.  Pt will report improved stool consistency type 4 across 50% of the time and demo less flexed coccyx across 2 weeks in order to promote bowel elimination  Baseline: Pt has Type 1 stool consistency based on Bristol stool scale 100% and her mom has to put boiled water to soften the stool to flush every time.  Coccyx flexed  Goal status: discontinued/pt not currently being seen by pelvic PT      ASSESSMENT:  CLINICAL IMPRESSION: Patient is challenged with keeping feet apart for neutral base of support and has increased L foot eversion requiring tactile and visual cueing for correction. She is highly motivated throughout session. She is challenged with coordination with cross body coordination. The pt will benefit from further skilled PT to improve balance, gait and mobility.   OBJECTIVE IMPAIRMENTS: Abnormal gait, decreased activity tolerance, decreased balance, decreased coordination, decreased endurance, decreased knowledge of use of DME, decreased mobility, difficulty walking, decreased strength, impaired tone, improper  body mechanics, postural dysfunction, and pain.   ACTIVITY LIMITATIONS: carrying, lifting, bending, standing, squatting, stairs, transfers, dressing, and locomotion level  PARTICIPATION LIMITATIONS: meal prep, cleaning, shopping, community activity, and yard work  PERSONAL FACTORS: Fitness, Sex, Time since onset of injury/illness/exacerbation, and 3+ comorbidities: PMH consists of the following: CP, depression, GAD, migraine headache, hx of LE surgery bilateral x2 in 2008 are also affecting patient's functional outcome.   REHAB POTENTIAL: Good  CLINICAL DECISION MAKING: Evolving/moderate complexity  EVALUATION COMPLEXITY: Moderate  PLAN:  PT FREQUENCY: 1-2x/week  PT DURATION: 12 weeks  PLANNED INTERVENTIONS: Therapeutic exercises, Therapeutic activity, Neuromuscular re-education, Balance training, Gait training, Patient/Family education, Self Care, Joint mobilization, Stair training, Vestibular training, Canalith repositioning, Visual/preceptual remediation/compensation, Orthotic/Fit training, DME instructions, Dry Needling, Electrical stimulation, Wheelchair mobility training, Spinal mobilization, Cryotherapy, Moist heat, Splintting, Taping, Biofeedback, Manual therapy, and Re-evaluation  PLAN FOR NEXT SESSION: gait mechanics, improve step-width, strengthening BLE, hip abduction, stretching, manual RLE; instruction Self-stretching/tone management in standing, seated stretch ankles/toes, continue plan  Klover Priestly, PT 03/17/2024, 4:31 PM  4:31 PM, 03/17/24 Physical Therapist - [facility]

## 2024-03-24 ENCOUNTER — Ambulatory Visit: Payer: MEDICAID | Attending: Internal Medicine

## 2024-03-24 ENCOUNTER — Ambulatory Visit: Payer: MEDICAID

## 2024-03-24 DIAGNOSIS — M6281 Muscle weakness (generalized): Secondary | ICD-10-CM | POA: Insufficient documentation

## 2024-03-24 DIAGNOSIS — R278 Other lack of coordination: Secondary | ICD-10-CM | POA: Diagnosis present

## 2024-03-24 DIAGNOSIS — M25661 Stiffness of right knee, not elsewhere classified: Secondary | ICD-10-CM | POA: Diagnosis present

## 2024-03-24 DIAGNOSIS — R2681 Unsteadiness on feet: Secondary | ICD-10-CM | POA: Diagnosis present

## 2024-03-24 DIAGNOSIS — M25662 Stiffness of left knee, not elsewhere classified: Secondary | ICD-10-CM | POA: Diagnosis present

## 2024-03-24 DIAGNOSIS — R262 Difficulty in walking, not elsewhere classified: Secondary | ICD-10-CM | POA: Insufficient documentation

## 2024-03-24 DIAGNOSIS — G809 Cerebral palsy, unspecified: Secondary | ICD-10-CM | POA: Insufficient documentation

## 2024-03-24 NOTE — Therapy (Signed)
 OUTPATIENT OCCUPATIONAL THERAPY NEURO RECERTIFICATION NOTE  Patient Name: Gladyce Mcray MRN: [id number] DOB:07/01/1996, 28 y.o., female Today's Date: 03/27/2024   REFERRING PROVIDER: Jerris More, MD  END OF SESSION:  OT End of Session - 03/27/24 0854     Visit Number 6    Number of Visits 18    Date for OT Re-Evaluation 06/16/24    OT Start Time 1404    OT Stop Time 1445    OT Time Calculation (min) 41 min    Activity Tolerance Patient tolerated treatment well    Behavior During Therapy [facility] for tasks assessed/performed            Past Medical History:  Diagnosis Date   Breakthrough bleeding    Cerebral palsy (HCC)    GAD (generalized anxiety disorder)    Migraine headache    Past Surgical History:  Procedure Laterality Date   LEG SURGERY     bilateral x2 in 2008   Patient Active Problem List   Diagnosis Date Noted   Seasonal allergic rhinitis due to pollen 03/27/2023   Bronchitis 03/13/2023   Gastroesophageal reflux disease without esophagitis 02/24/2023   Migraine without aura and without status migrainosus, not intractable 02/24/2023   GAD (generalized anxiety disorder) 02/24/2023   Loss of weight 07/17/2015   Depression 07/17/2015   Cerebral palsy (HCC) 07/17/2015   ONSET DATE: 1995  REFERRING DIAG: CP  THERAPY DIAG:  Muscle weakness (generalized)  Other lack of coordination  Cerebral palsy, unspecified type (HCC)  Rationale for Evaluation and Treatment: Rehabilitation  SUBJECTIVE:  SUBJECTIVE STATEMENT: Pt reports doing well today. Pt accompanied by: self  PERTINENT HISTORY: Pt. is a 28 y.o. female with a diagnosis of CP. PMHx includes: GAD, Migraine headache,GERD, bilateral LE surgery x's 2 in 2008.  PRECAUTIONS: None  WEIGHT BEARING RESTRICTIONS: No  PAIN:  Are you having pain?  No reports of pain  FALLS: Has patient fallen in last 6 months? No  LIVING ENVIRONMENT: Lives with: lives with their family Multiple family members Lives  in: House/apartment Stairs: 2 floors-stays on the main floor,  Has following equipment at home: Otho Blitz - 2 wheeled, Wheelchair (power), and Grab bars  PLOF:  Has a Nurse, adult.  PATIENT GOALS: To be able to tie her hair back, or try a new hairdo.  OBJECTIVE:  Note: Objective measures were completed at Evaluation unless otherwise noted.  HAND DOMINANCE: Left  ADLs:  Transfers/ambulation related to ADLs: Eating: Independent Grooming: Independent; assist required for hair care/ponytail UB Dressing: Independent LB Dressing: Independent, difficulty fastening pant button, soc Toileting: Mother assists  Bathing: Independent with washing, assist with drying. 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 Is working with PT  POSTURE COMMENTS:   Sitting balance: good supported  ACTIVITY TOLERANCE: Activity tolerance: Fair  FUNCTIONAL OUTCOME MEASURES: TBD  UPPER EXTREMITY ROM:    Active ROM Right Eval WFL Left Eval [facility]  Shoulder flexion    Shoulder abduction    Shoulder adduction    Shoulder extension    Shoulder internal rotation    Shoulder external rotation    Elbow flexion    Elbow extension    Wrist flexion    Wrist extension  Wrist ulnar deviation    Wrist radial deviation    Wrist pronation    Wrist supination    (Blank rows = not tested)  UPPER EXTREMITY MMT:     MMT Right Eval WFL Left Eval [facility]  Shoulder flexion    Shoulder abduction    Shoulder adduction    Shoulder extension    Shoulder internal rotation    Shoulder external rotation    Middle trapezius    Lower trapezius    Elbow flexion    Elbow  extension    Wrist flexion    Wrist extension    Wrist ulnar deviation    Wrist radial deviation    Wrist pronation    Wrist supination    (Blank rows = not tested)  HAND FUNCTION: Grip strength: Right: 13 lbs; Left: 18 lbs, Lateral pinch: Right: 3 lbs, Left: 8 lbs, and 3 point pinch: Right: 8 lbs, Left: 8 lbs 03/24/24: Right: 35 lbs; 41 lbs; Lateral pinch: Right: 5 lbs, Left: 6 lbs, 3 point pinch: Right: 8 lbs, Left: 9 lbs  COORDINATION: 9 Hole Peg test: Right: 1 min. & 17 sec; Left: 37 sec 03/24/24: R: 1 min 6 sec;  L 39 sec,  SENSATION: WFL light touch, and proprioceptive awareness  COGNITION: Overall cognitive status: Within functional limits for tasks assessed  VISION: Subjective report: No change form baseline  PERCEPTION: WFL  PRAXIS: TBD                                                                                                                      TREATMENT DATE: 03/24/2024  Therapeutic Activity: -Objective measures taken and goals updated and reviewed for recertification note.  Self Care: -Participation in practice trials with putting hair up in ponytail.  Mirror used for visual feedback.   -Vc provided for sequencing to maximize bimanual technique and optimizing tightness and neatness when gathering hair  PATIENT EDUCATION: Education details: hair care strategies, progress towards goals Person educated: Patient Education method: Explanation, demo Education comprehension: verbalized understanding, demonstrated understanding with multimodal cuing  HOME EXERCISE PROGRAM: Theraputty  GOALS: Goals reviewed with patient? Yes  SHORT TERM GOALS: Target date: 02/11/2024   Pt. Will be independent with BUE HEP programs. Baseline:EVal: No current HEP; 03/24/24: pt reports using her putty 2x daily  Goal status: achieved  LONG TERM GOALS: Target date: 06/16/2024    Pt. Will independently be able to use scissors to cut a straight line. Baseline: Eval: Pt. Has  difficulty using scissors to cut a straight line; 03/24/24: Decreased coordination limits ability to cut on a straight line; pt is now adapting cutting her tshirts with her new cricut machine Goal status: d/c  2.  Pt. Will fix her hair in an updo with modified independence Baseline: Eval: Pt. Is unable to tie her hair into a ponytail; 03/24/24: Can do but messy/loose Goal status: ongoing  3.  Pt. Will independently perform nailcare. Baseline: Eval: Pt. has difficulty completing nail care; 03/24/24: strategies  not yet addressed  Goal status: INITIAL  4.  Pt. Will independently, and accurately locate the middle, and center shirts for screen printing. Baseline: Eval: Pt. Has difficulty with locating the middle, and centering shirts for screen printing; 03/24/24: Pt is using a plastic screen overlay while maps out middle of shirt Goal status: achieved  5.  Pt. Will demonstrate compensatory strategies for home management tasks. Baseline: Eval: Education to be provided about compensatory strategies; 03/24/24: Pt reports her HHA assists her to use her kitchen aid and air fryer; strategies not yet addressed in OT sessions Goal status: ongoing  6. Pt. Will improve right hand [facility] skills by 5 sec. of speed to be able to fasten her pants.  Baseline: Eval: R: 1 min. & 17 sec. L: 37 sec. Pt. Has difficulty fastening her pants; 03/24/24: R: 1 min 6 sec;  L 39 sec; continued difficulty   Goal status: improved/ongoing   ASSESSMENT: CLINICAL IMPRESSION: Pt seen for OT recertification visit this date.  Pt presents with significant gains with bilat grip and pinch strengthening, reporting her consistency to work with her theraputty 2x daily.  R hand [facility] skills showing improvement, though pt continues to have difficulty with management of clothing fasteners and manipulation of small ADL supplies.  Additional training needed.  Pt has been receptive to ADL compensation strategies thus far and has found an adapted ponytail  holder which she can now use to put her hair in a ponytail, but continues to have difficulty with fastening hair up tightly.  Pt is now using AE as noted above to find the middle of her tshirts and cut them on a straight line.  Pt. continues to benefit from OT services to work towards meeting unmet goals listed above to improve functional performance with ADLs/IADLs, to maximize independence, and to decrease dependence on caregivers.   PERFORMANCE DEFICITS: in functional skills including ADLs, IADLs, coordination, dexterity, strength, Fine motor control, Gross motor control, decreased knowledge of use of DME, and UE functional use, cognitive skills including , and psychosocial skills including coping strategies, environmental adaptation, and routines and behaviors.   IMPAIRMENTS: are limiting patient from ADLs, IADLs, and leisure.   CO-MORBIDITIES: may have co-morbidities  that affects occupational performance. Patient will benefit from skilled OT to address above impairments and improve overall function.  MODIFICATION OR ASSISTANCE TO COMPLETE EVALUATION: Min-Moderate modification of tasks or assist with assess necessary to complete an evaluation.  OT OCCUPATIONAL PROFILE AND HISTORY: Detailed assessment: Review of records and additional review of physical, cognitive, psychosocial history related to current functional performance.  CLINICAL DECISION MAKING: Moderate - several treatment options, min-mod task modification necessary  REHAB POTENTIAL: Good  EVALUATION COMPLEXITY: Moderate    PLAN:  OT FREQUENCY: 1x/week  OT DURATION: 12 weeks  PLANNED INTERVENTIONS: 97168 OT Re-evaluation, 97535 self care/ADL training, 40981 therapeutic exercise, 97530 therapeutic activity, 97112 neuromuscular re-education, 97140 manual therapy, 97018 paraffin, 19147 moist heat, 97034 contrast bath, 97760 Orthotics management and training, 82956 Splinting (initial encounter), functional mobility training,  visual/perceptual remediation/compensation, patient/family education, and DME and/or AE instructions  RECOMMENDED OTHER SERVICES: PT  CONSULTED AND AGREED WITH PLAN OF CARE: Patient  PLAN FOR NEXT SESSION: Treatment  Marcus Sewer, MS, OTR/L  03/27/2024, 8:58 AM

## 2024-03-30 ENCOUNTER — Other Ambulatory Visit: Payer: Self-pay | Admitting: Cardiology

## 2024-03-30 ENCOUNTER — Ambulatory Visit: Payer: MEDICAID | Attending: Physical Therapy | Admitting: Physical Therapy

## 2024-03-31 ENCOUNTER — Ambulatory Visit: Payer: MEDICAID

## 2024-03-31 DIAGNOSIS — M6281 Muscle weakness (generalized): Secondary | ICD-10-CM | POA: Diagnosis not present

## 2024-03-31 DIAGNOSIS — R2681 Unsteadiness on feet: Secondary | ICD-10-CM

## 2024-03-31 DIAGNOSIS — R278 Other lack of coordination: Secondary | ICD-10-CM

## 2024-03-31 DIAGNOSIS — M25662 Stiffness of left knee, not elsewhere classified: Secondary | ICD-10-CM

## 2024-03-31 DIAGNOSIS — M25661 Stiffness of right knee, not elsewhere classified: Secondary | ICD-10-CM

## 2024-03-31 DIAGNOSIS — R262 Difficulty in walking, not elsewhere classified: Secondary | ICD-10-CM

## 2024-03-31 NOTE — Therapy (Signed)
 OUTPATIENT PHYSICAL THERAPY NEURO TREATMENT NOTE   Patient Name: Renee Walton MRN: [id number] DOB:1996/03/19, 28 y.o., female Today's Date: 03/31/2024   PCP: Aisha Hove, MD REFERRING PROVIDER:   Aisha Hove, MD    END OF SESSION:  PT End of Session - 03/31/24 1400     Visit Number 39    Number of Visits 47    Date for PT Re-Evaluation 04/21/24    Authorization Type Medicaid [place] [employer]    Authorization Time Period 02/25/22-06/02/22 for 12 visits    PT Start Time 1402    PT Stop Time 1445    PT Time Calculation (min) 43 min    Equipment Utilized During Treatment Gait belt    Activity Tolerance Patient tolerated treatment well    Behavior During Therapy WFL for tasks assessed/performed                         Past Medical History:  Diagnosis Date   Breakthrough bleeding    Cerebral palsy (HCC)    GAD (generalized anxiety disorder)    Migraine headache    Past Surgical History:  Procedure Laterality Date   LEG SURGERY     bilateral x2 in 2008   Patient Active Problem List   Diagnosis Date Noted   Seasonal allergic rhinitis due to pollen 03/27/2023   Bronchitis 03/13/2023   Gastroesophageal reflux disease without esophagitis 02/24/2023   Migraine without aura and without status migrainosus, not intractable 02/24/2023   GAD (generalized anxiety disorder) 02/24/2023   Loss of weight 07/17/2015   Depression 07/17/2015   Cerebral palsy (HCC) 07/17/2015    ONSET DATE: 10/14/94 pt has CP  REFERRING DIAG:   G80.9 (ICD-10-CM) - Cerebral palsy (HCC)    THERAPY DIAG:  Joint stiffness of right lower leg  Joint stiffness of left lower leg  Difficulty in walking, not elsewhere classified  Other lack of coordination  Muscle weakness (generalized)  Unsteadiness on feet  Rationale for Evaluation and Treatment: Rehabilitation  SUBJECTIVE:                                                                                                                                                                                              SUBJECTIVE STATEMENT   Pt reports difficulty lifting her BLE into bath tub, and noticed this over the past month.  She reports no falls or stumbles. She reports difficulty descending stairs. She feels her RLE is tighter than it usual is following botox.  Pt accompanied by: self  PERTINENT HISTORY:    Pt is a pleasant 28 y/o  female with CP who is returning to PT after absence and trip to [place]. She would like to improve her mobility, gait and balance. Pt has continued her PT exercises while away and worked on gait, and wants to become independent using a RW. She reports 2 recent falls, and no injuries. Pt also with stiffness in BLEs. PMH consists of the following: CP, depression, GAD, migraine headache, hx of LE surgery bilateral x2 in 2008.    PAIN: from eval Are you having pain? No  PRECAUTIONS: Fall  WEIGHT BEARING RESTRICTIONS: No  FALLS: Has patient fallen in last 6 months? Yes. Number of falls 2 when walking. She reports no injury. She got back up independently   PATIENT GOALS: Pt would like to improve her ability to ambulate with RW both inside and over outside surfaces in order to increase her independence with mobility. She would like to learn how to safely get in and out of a car with her RW.  OBJECTIVE:   Today's Treatment: 03/31/24 Gait belt donned and CGA provided for upright activities unless otherwise noted   TA: Seated LTL step tap onto 6 step 12x each LE  - then repeats with 2 ft step 2x12 each LE - more difficult on R side, pt provides occ assist for pt to reach top of step  RLE assisted hamstring stretch with step x 90 sec to promote tissue extensibility and mobility needed for LE clearance of bathtub  Seated hip abduction/ER - first attempts with R and Y t.bands but currently too challenging. Completes sets AROM with TC from PT 3x10 BLE  - Pt rates medium   Stairs alt step  6 step x multiple reps each LE- cuing for technique to improve foot clearance   Ascend/descend 4 steps x 2 rounds with BUE support and close CGA - pt with difficulty clearing foot (particularly R foot) descending steps and exhibits dragging - may need to try lateral approach future visit to determine if safer technique for pt at this time  Standing hip abd side steps with BUE support 2x10 each LE and PT assisting pt with approx 5 reps into greater hip abduction for additional adductor stretching    PATIENT EDUCATION: Education details: Pt educated throughout session about proper posture and technique with exercises. Improved exercise technique, movement at target joints, use of target muscles after min to mod verbal, visual, tactile cues.   Person educated: Patient Education method: Explanation, Demonstration, and Verbal cues Education comprehension: verbalized understanding and returned demonstration  HOME EXERCISE PROGRAM See pt instruction section   GOALS: Goals reviewed with patient? Yes  SHORT TERM GOALS: Target date: 03/10/2024    Patient will be independent in home exercise program to improve strength/mobility for better functional independence with ADLs. Baseline: to be initiated next 1-2 visits; 02/19/2023: pt not consistently performing HEP; 04/23/23:  Pt reports she goes to gym every three days; 8/7: Pt reports she has been increasing her activity levels, but currently waiting for caregiver to start to be able to go to gym; 01/28/2024:  exercises are going well per pt report Goal status: MET  2. The pt will report independence and consistency with gym program outside PT to continue making gains with strength and mobility  Baseline: pt to start going to gym soon  Goal status: NEW  LONG TERM GOALS: Target date: 04/21/2024     Patient will increase FOTO score to equal to or greater than  76  to demonstrate statistically significant improvement in mobility  and quality of life.   Baseline: risk adjusted 51; 02/19/2023: 63 * pt reporting pain in left leg limiting stair climbing; 04/23/23: 81 Goal status: MET/DISCONTINUED  2.  Pt will reduce 5xsSTS time to < 8 sec to demonstrate clinically significant improvement in LE strength and balance to reduce risk of falls.  Baseline: previously 12.76 sec on 06/10/22, now 10 sec to RW on 10/22/2022; 02/19/2023 8.5 seconds hands-free; 04/23/23: 7 seconds; 10/29/23: 6.5 sec  goal status: MET  3.  Pt will improve 6 MWT to > 1000' with no AD and no LOB to demonstrate ability to improve safety with community distances. Baseline: was 822 ft back in August 2023, now 808 with RW, CGA; 02/19/2023 845 ft with 6VH; 04/23/23: 820 ft 4WW; 05/28/23:  728 ft with RW, limited by LLE pain; 07/30/2023: 878 ft with RW; 10/08/23: 518 pt somewhat distracted throughout; 935 ft with no AD, CGA; 01/28/24 645 ft with RW (used as pt slightly unsteady today)  Goal status: IN PROGRESS  4.  Pt will improve 10 m gait speed with LRAD to > 1.0 m/s to demonstrate reduced risk of falls along with safe ability to complete community walking tasks Baseline: previously 0.7 m/s on 06/10/2022, now 0.77 m/s with RW; 02/19/2023: 0.82 m/s with RW; 04/23/23: 0.95 m/s with RW; 05/28/23: 0.83 m/s pain limited LLE; 10/9: 1.08 m/s with RW  Goal status: MET  5.  Patient will increase Berg Balance score by > 6 points to demonstrate decreased fall risk during functional activities.  Baseline: previously 35/56 on 03/11/22(?), test deferred to next appt; 11/13/2022: 36/56; 02/19/2023: 39/56; 04/23/23: 42/56  Goal status: MET  6.  Pt will report improved stool consistency type 4 across 50% of the time and demo less flexed coccyx across 2 weeks in order to promote bowel elimination  Baseline: Pt has Type 1 stool consistency based on Bristol stool scale 100% and her mom has to put boiled water to soften the stool to flush every time.  Coccyx flexed  Goal status: discontinued/pt not currently being seen by pelvic PT       ASSESSMENT:  CLINICAL IMPRESSION: Pt exhibits greater tone in RLE compared to LLE making it difficult for pt to complete foot clearance exercises and stair intervention and per report difficulty getting in and out of her tub. Pt with some improvement with foot clearance ascending stairs when cued to initiate step up with RLE, but unable to correct descending steps with dragging throughout. PT encouraged pt to follow-up with her physician regarding reports of increased RLE tightness. Pt agreeable to plan. The pt will benefit from further skilled PT to improve balance, gait and mobility.   OBJECTIVE IMPAIRMENTS: Abnormal gait, decreased activity tolerance, decreased balance, decreased coordination, decreased endurance, decreased knowledge of use of DME, decreased mobility, difficulty walking, decreased strength, impaired tone, improper body mechanics, postural dysfunction, and pain.   ACTIVITY LIMITATIONS: carrying, lifting, bending, standing, squatting, stairs, transfers, dressing, and locomotion level  PARTICIPATION LIMITATIONS: meal prep, cleaning, shopping, community activity, and yard work  PERSONAL FACTORS: Fitness, Sex, Time since onset of injury/illness/exacerbation, and 3+ comorbidities: PMH consists of the following: CP, depression, GAD, migraine headache, hx of LE surgery bilateral x2 in 2008 are also affecting patient's functional outcome.   REHAB POTENTIAL: Good  CLINICAL DECISION MAKING: Evolving/moderate complexity  EVALUATION COMPLEXITY: Moderate  PLAN:  PT FREQUENCY: 1-2x/week  PT DURATION: 12 weeks  PLANNED INTERVENTIONS: Therapeutic exercises, Therapeutic activity, Neuromuscular re-education, Balance training, Gait training, Patient/Family education, Self Care,  Joint mobilization, Stair training, Vestibular training, Canalith repositioning, Visual/preceptual remediation/compensation, Orthotic/Fit training, DME instructions, Dry Needling, Electrical stimulation,  Wheelchair mobility training, Spinal mobilization, Cryotherapy, Moist heat, Splintting, Taping, Biofeedback, Manual therapy, and Re-evaluation  PLAN FOR NEXT SESSION: gait mechanics, improve step-width, strengthening BLE, hip abduction, stretching, manual RLE; instruction Self-stretching/tone management in standing, seated stretch ankles/toes, assess LTL approach with UE support with descending steps, continue plan  Samie Crews, PT 03/31/2024, 5:30 PM  5:30 PM, 03/31/24 Physical Therapist - [facility]

## 2024-04-07 ENCOUNTER — Ambulatory Visit: Payer: MEDICAID

## 2024-04-07 DIAGNOSIS — M6281 Muscle weakness (generalized): Secondary | ICD-10-CM | POA: Diagnosis not present

## 2024-04-07 DIAGNOSIS — R278 Other lack of coordination: Secondary | ICD-10-CM

## 2024-04-07 DIAGNOSIS — G809 Cerebral palsy, unspecified: Secondary | ICD-10-CM

## 2024-04-07 NOTE — Therapy (Unsigned)
 OUTPATIENT OCCUPATIONAL THERAPY NEURO TREATMENT NOTE  Patient Name: Renee Walton MRN: [id number] DOB:03-Aug-1996, 28 y.o., female Today's Date: 04/11/2024   REFERRING PROVIDER: Fernand La, MD  END OF SESSION:  OT End of Session - 04/11/24 2129     Visit Number 7    Number of Visits 18    Date for OT Re-Evaluation 06/16/24    OT Start Time 1400    OT Stop Time 1445    OT Time Calculation (min) 45 min    Activity Tolerance Patient tolerated treatment well    Behavior During Therapy WFL for tasks assessed/performed          Past Medical History:  Diagnosis Date   Breakthrough bleeding    Cerebral palsy (HCC)    GAD (generalized anxiety disorder)    Migraine headache    Past Surgical History:  Procedure Laterality Date   LEG SURGERY     bilateral x2 in 2008   Patient Active Problem List   Diagnosis Date Noted   Seasonal allergic rhinitis due to pollen 03/27/2023   Bronchitis 03/13/2023   Gastroesophageal reflux disease without esophagitis 02/24/2023   Migraine without aura and without status migrainosus, not intractable 02/24/2023   GAD (generalized anxiety disorder) 02/24/2023   Loss of weight 07/17/2015   Depression 07/17/2015   Cerebral palsy (HCC) 07/17/2015   ONSET DATE: 1995  REFERRING DIAG: CP  THERAPY DIAG:  Muscle weakness (generalized)  Other lack of coordination  Cerebral palsy, unspecified type (HCC)  Rationale for Evaluation and Treatment: Rehabilitation  SUBJECTIVE:  SUBJECTIVE STATEMENT: Pt reports wanting to work on tying knots so that she can make her beaded bracelets. Pt accompanied by: self  PERTINENT HISTORY: Pt. is a 28 y.o. female with a diagnosis of CP. PMHx includes: GAD, Migraine headache,GERD, bilateral LE surgery x's 2 in 2008.  PRECAUTIONS: None  WEIGHT BEARING RESTRICTIONS: No  PAIN:  Are you having pain?  No reports of pain  FALLS: Has patient fallen in last 6 months? No  LIVING ENVIRONMENT: Lives with: lives  with their family Multiple family members Lives in: House/apartment Stairs: 2 floors-stays on the main floor,  Has following equipment at home: Vannie - 2 wheeled, Wheelchair (power), and Grab bars  PLOF:  Has a Nurse, adult.  PATIENT GOALS: To be able to tie her hair back, or try a new hairdo.  OBJECTIVE:  Note: Objective measures were completed at Evaluation unless otherwise noted.  HAND DOMINANCE: Left  ADLs:  Transfers/ambulation related to ADLs: Eating: Independent Grooming: Independent; assist required for hair care/ponytail UB Dressing: Independent LB Dressing: Independent, difficulty fastening pant button, soc Toileting: Mother assists  Bathing: Independent with washing, assist with drying. 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 Is working with PT  POSTURE COMMENTS:   Sitting balance: good supported  ACTIVITY TOLERANCE: Activity tolerance: Fair  FUNCTIONAL OUTCOME MEASURES: TBD  UPPER EXTREMITY ROM:    Active ROM Right Eval WFL Left Eval [facility]  Shoulder flexion    Shoulder abduction    Shoulder adduction    Shoulder extension    Shoulder internal rotation    Shoulder external rotation    Elbow flexion    Elbow extension  Wrist flexion    Wrist extension    Wrist ulnar deviation    Wrist radial deviation    Wrist pronation    Wrist supination    (Blank rows = not tested)  UPPER EXTREMITY MMT:     MMT Right Eval WFL Left Eval [facility]  Shoulder flexion    Shoulder abduction    Shoulder adduction    Shoulder extension    Shoulder internal rotation    Shoulder external rotation    Middle trapezius     Lower trapezius    Elbow flexion    Elbow extension    Wrist flexion    Wrist extension    Wrist ulnar deviation    Wrist radial deviation    Wrist pronation    Wrist supination    (Blank rows = not tested)  HAND FUNCTION: Grip strength: Right: 13 lbs; Left: 18 lbs, Lateral pinch: Right: 3 lbs, Left: 8 lbs, and 3 point pinch: Right: 8 lbs, Left: 8 lbs 03/24/24: Right: 35 lbs; 41 lbs; Lateral pinch: Right: 5 lbs, Left: 6 lbs, 3 point pinch: Right: 8 lbs, Left: 9 lbs  COORDINATION: 9 Hole Peg test: Right: 1 min. & 17 sec; Left: 37 sec 03/24/24: R: 1 min 6 sec;  L 39 sec,  SENSATION: WFL light touch, and proprioceptive awareness  COGNITION: Overall cognitive status: Within functional limits for tasks assessed  VISION: Subjective report: No change form baseline  PERCEPTION: WFL  PRAXIS: TBD                                                                                                                      TREATMENT DATE: 04/07/2024  Therapeutic Activity: -Facilitated bimanual FMC skills working to tie knots on a variety of laces with different levels of thickness, progressing to thin string to simulate tying bracelets.  -Practiced threading beads onto variety of strings, securing beads with knots, and twisting ends on a wire string in place of a knot. -Max multimodal cueing for securing beads to string via twisting end of wire or tying knots  PATIENT EDUCATION: Education details: [facility] skills Person educated: Patient Education method: Programmer, multimedia, demo Education comprehension: needs further education  HOME EXERCISE PROGRAM: Theraputty  GOALS: Goals reviewed with patient? Yes  SHORT TERM GOALS: Target date: 02/11/2024   Pt. Will be independent with BUE HEP programs. Baseline:EVal: No current HEP; 03/24/24: pt reports using her putty 2x daily  Goal status: achieved  LONG TERM GOALS: Target date: 06/16/2024    Pt. Will independently be able to use scissors to cut a  straight line. Baseline: Eval: Pt. Has difficulty using scissors to cut a straight line; 03/24/24: Decreased coordination limits ability to cut on a straight line; pt is now adapting cutting her tshirts with her new cricut machine Goal status: d/c  2.  Pt. Will fix her hair in an updo with modified independence Baseline: Eval: Pt. Is unable to tie her hair into a ponytail; 03/24/24: Can do but messy/loose Goal  status: ongoing  3.  Pt. Will independently perform nailcare. Baseline: Eval: Pt. has difficulty completing nail care; 03/24/24: strategies not yet addressed  Goal status: INITIAL  4.  Pt. Will independently, and accurately locate the middle, and center shirts for screen printing. Baseline: Eval: Pt. Has difficulty with locating the middle, and centering shirts for screen printing; 03/24/24: Pt is using a plastic screen overlay while maps out middle of shirt Goal status: achieved  5.  Pt. Will demonstrate compensatory strategies for home management tasks. Baseline: Eval: Education to be provided about compensatory strategies; 03/24/24: Pt reports her HHA assists her to use her kitchen aid and air fryer; strategies not yet addressed in OT sessions Goal status: ongoing  6. Pt. Will improve right hand [facility] skills by 5 sec. of speed to be able to fasten her pants.  Baseline: Eval: R: 1 min. & 17 sec. L: 37 sec. Pt. Has difficulty fastening her pants; 03/24/24: R: 1 min 6 sec;  L 39 sec; continued difficulty   Goal status: improved/ongoing   ASSESSMENT: CLINICAL IMPRESSION: Pt requested to focus on tying knots to enable increased indep with making her beaded bracelets.  Pt demonstrated good ability to string beads onto a variety of lace types, but required max multimodal cueing to tie a knot after high reps of instruction and return demo; hand over hand assist required and max vc/tactile cues for sequencing to tie a knot.  Trialed wire for stringing beads to substitute twisting wire to secure beads in  place of a knot, but pt also required extensive cuing for this technique, and was unable to twist enough to prevent wire from sticking out and risking punctured skin.  OT encouraged pt continue to practice tying knots with help of online tutorials at home.  Pt. continues to benefit from OT services to work towards meeting unmet goals listed above to improve functional performance with ADLs/IADLs, to maximize independence, and to decrease dependence on caregivers.   PERFORMANCE DEFICITS: in functional skills including ADLs, IADLs, coordination, dexterity, strength, Fine motor control, Gross motor control, decreased knowledge of use of DME, and UE functional use, cognitive skills including , and psychosocial skills including coping strategies, environmental adaptation, and routines and behaviors.   IMPAIRMENTS: are limiting patient from ADLs, IADLs, and leisure.   CO-MORBIDITIES: may have co-morbidities  that affects occupational performance. Patient will benefit from skilled OT to address above impairments and improve overall function.  MODIFICATION OR ASSISTANCE TO COMPLETE EVALUATION: Min-Moderate modification of tasks or assist with assess necessary to complete an evaluation.  OT OCCUPATIONAL PROFILE AND HISTORY: Detailed assessment: Review of records and additional review of physical, cognitive, psychosocial history related to current functional performance.  CLINICAL DECISION MAKING: Moderate - several treatment options, min-mod task modification necessary  REHAB POTENTIAL: Good  EVALUATION COMPLEXITY: Moderate    PLAN:  OT FREQUENCY: 1x/week  OT DURATION: 12 weeks  PLANNED INTERVENTIONS: 97168 OT Re-evaluation, 97535 self care/ADL training, 02889 therapeutic exercise, 97530 therapeutic activity, 97112 neuromuscular re-education, 97140 manual therapy, 97018 paraffin, 02989 moist heat, 97034 contrast bath, 97760 Orthotics management and training, 02239 Splinting (initial encounter),  functional mobility training, visual/perceptual remediation/compensation, patient/family education, and DME and/or AE instructions  RECOMMENDED OTHER SERVICES: PT  CONSULTED AND AGREED WITH PLAN OF CARE: Patient  PLAN FOR NEXT SESSION: Treatment  Inocente Blazing, MS, OTR/L  04/11/2024, 9:30 PM

## 2024-04-13 ENCOUNTER — Ambulatory Visit: Payer: MEDICAID | Admitting: Podiatry

## 2024-04-13 DIAGNOSIS — B353 Tinea pedis: Secondary | ICD-10-CM

## 2024-04-13 MED ORDER — CLOTRIMAZOLE-BETAMETHASONE 1-0.05 % EX CREA: 1.0000 | TOPICAL_CREAM | Freq: Every day | CUTANEOUS | 0 refills | Status: AC

## 2024-04-13 NOTE — Progress Notes (Signed)
 Subjective:  Patient ID: Renee Walton, female    DOB: 11/16/1995,  MRN: [id number]  Chief Complaint  Patient presents with   Tinea Pedis    28 y.o. female presents with the above complaint.  Patient presents with complaint of left athlete's foot.  Patient states that has been for quite some time.  It is primarily on bilateral foot.  She has hyperhidrosis leading to very moist feet.  She has tried socks with powdered none of which has helped.  She would like to discuss treatment options for this.  Pain scale 7 out of 10 dull aching nature.   Review of Systems: Negative except as noted in the HPI. Denies N/V/F/Ch.  Past Medical History:  Diagnosis Date   Breakthrough bleeding    Cerebral palsy (HCC)    GAD (generalized anxiety disorder)    Migraine headache     Current Outpatient Medications:    clotrimazole -betamethasone  (LOTRISONE ) cream, Apply 1 Application topically daily., Disp: 30 g, Rfl: 0   baclofen (LIORESAL) 10 MG tablet, TAKE ONE TABLET BY MOUTH 3 TIMES DAILY, Disp: 350 each, Rfl: 1   benzonatate  (TESSALON ) 100 MG capsule, TAKE ONE CAPSULE THREE TIMES A DAY IF NEEDED FOR COUGH, Disp: 30 capsule, Rfl: 1   clotrimazole -betamethasone  (LOTRISONE ) cream, Apply to affected area 2 times daily until the rash has resolved and then for 3 additional days. (Patient not taking: Reported on 02/03/2024), Disp: 45 g, Rfl: 1   escitalopram (LEXAPRO) 5 MG tablet, Take 5 mg by mouth daily., Disp: , Rfl:    gabapentin (NEURONTIN) 100 MG capsule, TAKE 1 CAPSULE BY MOUTH AT BEDTIME, Disp: 90 capsule, Rfl: 3   LO LOESTRIN FE 1 MG-10 MCG / 10 MCG tablet, TAKE AS DIRECTED (Patient not taking: Reported on 02/03/2024), Disp: 84 tablet, Rfl: 3   meloxicam (MOBIC) 15 MG tablet, TAKE 1 TABLET BY MOUTH DAILY AS NEEDED, Disp: 90 tablet, Rfl: 2   Multiple Vitamins-Minerals (CENTRUM SILVER ULTRA WOMENS PO), Take by mouth., Disp: , Rfl:    omeprazole  (PRILOSEC) 40 MG capsule, Take 1 capsule (40 mg total) by  mouth daily., Disp: 90 capsule, Rfl: 1   ondansetron  (ZOFRAN ) 4 MG tablet, TAKE ONE TABLET BY MOUTH EVERY 4 TO 6 HOURS AS NEEDED, Disp: 60 tablet, Rfl: 3   senna-docusate (SENOKOT-S) 8.6-50 MG tablet, Take by mouth. (Patient not taking: Reported on 02/03/2024), Disp: , Rfl:    SUMAtriptan (IMITREX) 50 MG tablet, USE AS DIRECTED (MAX OF 200 MG IN 24 HOURS), Disp: 90 tablet, Rfl: 1   Vitamin D , Ergocalciferol , (DRISDOL ) 1.25 MG (50000 UNIT) CAPS capsule, Take 1 capsule (50,000 Units total) by mouth every 7 (seven) days., Disp: 12 capsule, Rfl: 1  Social History   Tobacco Use  Smoking Status Never  Smokeless Tobacco Never    Allergies  Allergen Reactions   Cat Dander    Cat Dander Itching   Objective:  There were no vitals filed for this visit. There is no height or weight on file to calculate BMI. Constitutional Well developed. Well nourished.  Vascular Dorsalis pedis pulses palpable bilaterally. Posterior tibial pulses palpable bilaterally. Capillary refill normal to all digits.  No cyanosis or clubbing noted. Pedal hair growth normal.  Neurologic Normal speech. Oriented to person, place, and time. Epicritic sensation to light touch grossly present bilaterally.  Dermatologic Nails within normal limits Skin skin epidermolysis to the left athlete's foot.  Denies any open wounds or lesion.  Per epidermolysis noted.  Subjective complaint of itching noted.  No open wounds or lesion noted.  Orthopedic: Normal joint ROM without pain or crepitus bilaterally. No visible deformities. No bony tenderness.   Radiographs: None Assessment:   1. Tinea pedis of left foot    Plan:  Patient was evaluated and treated and all questions answered.  Left foot athlete's foot secondary to hyperhidrosis - All questions and concerns were discussed with the patient in extensive detail given the amount of hyperhidrosis present patient will benefit from cleansing gel - Lotrisone  cream was sent to the  pharmacy of asked her to apply twice a day she states understanding will do so immediately  No follow-ups on file.

## 2024-04-14 ENCOUNTER — Ambulatory Visit: Payer: MEDICAID

## 2024-04-14 DIAGNOSIS — M6281 Muscle weakness (generalized): Secondary | ICD-10-CM | POA: Diagnosis not present

## 2024-04-14 DIAGNOSIS — R2681 Unsteadiness on feet: Secondary | ICD-10-CM

## 2024-04-14 DIAGNOSIS — M25662 Stiffness of left knee, not elsewhere classified: Secondary | ICD-10-CM

## 2024-04-14 DIAGNOSIS — G809 Cerebral palsy, unspecified: Secondary | ICD-10-CM

## 2024-04-14 DIAGNOSIS — M25661 Stiffness of right knee, not elsewhere classified: Secondary | ICD-10-CM

## 2024-04-14 DIAGNOSIS — R262 Difficulty in walking, not elsewhere classified: Secondary | ICD-10-CM

## 2024-04-14 NOTE — Therapy (Signed)
 OUTPATIENT PHYSICAL THERAPY NEURO TREATMENT NOTE/ RECERT/ Physical Therapy Progress Note   Dates of reporting period  10/08/23   to   04/14/24     Patient Name: Renee Walton MRN: [id number] DOB:[birth year], 28 y.o., female Today's Date: 04/14/2024   PCP: Fernand Fredy RAMAN, MD REFERRING PROVIDER:   Fernand Fredy RAMAN, MD    END OF SESSION:  PT End of Session - 04/14/24 1356     Visit Number 40    Number of Visits 52    Date for PT Re-Evaluation 07/07/24    Authorization Type Medicaid [place] Access    Authorization Time Period 02/25/22-06/02/22 for 12 visits    PT Start Time 1400    PT Stop Time 1444    PT Time Calculation (min) 44 min    Equipment Utilized During Treatment Gait belt    Activity Tolerance Patient tolerated treatment well    Behavior During Therapy WFL for tasks assessed/performed                       Past Medical History:  Diagnosis Date   Breakthrough bleeding    Cerebral palsy (HCC)    GAD (generalized anxiety disorder)    Migraine headache    Past Surgical History:  Procedure Laterality Date   LEG SURGERY     bilateral x2 in 2008   Patient Active Problem List   Diagnosis Date Noted   Seasonal allergic rhinitis due to pollen 03/27/2023   Bronchitis 03/13/2023   Gastroesophageal reflux disease without esophagitis 02/24/2023   Migraine without aura and without status migrainosus, not intractable 02/24/2023   GAD (generalized anxiety disorder) 02/24/2023   Loss of weight 07/17/2015   Depression 07/17/2015   Cerebral palsy (HCC) 07/17/2015    ONSET DATE: August 25, 1996 pt has CP  REFERRING DIAG:   G80.9 (ICD-10-CM) - Cerebral palsy (HCC)    THERAPY DIAG:  Muscle weakness (generalized)  Cerebral palsy, unspecified type (HCC)  Joint stiffness of right lower leg  Joint stiffness of left lower leg  Difficulty in walking, not elsewhere classified  Unsteadiness on feet  Rationale for Evaluation and Treatment:  Rehabilitation  SUBJECTIVE:                                                                                                                                                                                             SUBJECTIVE STATEMENT   Patient reports no pain today due to the warmer weather. Reports difficulty with getting into/out of tub and stairs.   Pt accompanied by: self  PERTINENT HISTORY:    Pt is a pleasant 28 y/o  female with CP who is returning to PT after absence and trip to [place]. She would like to improve her mobility, gait and balance. Pt has continued her PT exercises while away and worked on gait, and wants to become independent using a RW. She reports 2 recent falls, and no injuries. Pt also with stiffness in BLEs. PMH consists of the following: CP, depression, GAD, migraine headache, hx of LE surgery bilateral x2 in 2008.    PAIN: from eval Are you having pain? No  PRECAUTIONS: Fall  WEIGHT BEARING RESTRICTIONS: No  FALLS: Has patient fallen in last 6 months? Yes. Number of falls 2 when walking. She reports no injury. She got back up independently   PATIENT GOALS: Pt would like to improve her ability to ambulate with RW both inside and over outside surfaces in order to increase her independence with mobility. She would like to learn how to safely get in and out of a car with her RW.  OBJECTIVE:   Today's Treatment: 04/14/24 Gait belt donned and CGA provided for upright activities unless otherwise noted  Physical therapy treatment session today consisted of completing assessment of goals and administration of testing as demonstrated and documented in flow sheet, treatment, and goals section of this note. Addition treatments may be found below.   Step negotiation: step to pattern with BUE support, extreme L foot eversion and narrow BOS    [facility] PT Assessment - 04/14/24 0001       Berg Balance Test   Sit to Stand Able to stand without using hands and stabilize  independently    Standing Unsupported Able to stand safely 2 minutes    Sitting with Back Unsupported but Feet Supported on Floor or Stool Able to sit 2 minutes under supervision    Stand to Sit Sits safely with minimal use of hands    Transfers Able to transfer safely, definite need of hands    Standing Unsupported with Eyes Closed Able to stand 10 seconds safely    Standing Unsupported with Feet Together Able to place feet together independently and stand 1 minute safely    From Standing, Reach Forward with Outstretched Arm Can reach forward >12 cm safely (5)    From Standing Position, Pick up Object from Floor Able to pick up shoe, needs supervision    From Standing Position, Turn to Look Behind Over each Shoulder Looks behind from both sides and weight shifts well    Turn 360 Degrees Able to turn 360 degrees safely but slowly    Standing Unsupported, Alternately Place Feet on Step/Stool Able to complete >2 steps/needs minimal assist    Standing Unsupported, One Foot in Front Able to take small step independently and hold 30 seconds    Standing on One Leg Tries to lift leg/unable to hold 3 seconds but remains standing independently    Total Score 42          6 Min Walk Test:  Instructed patient to ambulate as quickly and as safely as possible for 6 minutes using LRAD. Patient was allowed to take standing rest breaks without stopping the test, but if the patient required a sitting rest break the clock would be stopped and the test would be over.  Results: 1014 feet with CGA. Results indicate that the patient has reduced endurance with ambulation compared to age matched norms.  Age Matched Norms: 83-69 yo M: 3 F: 40, 39-79 yo M: 77 F: 471, 79-89 yo M: 417 F: 392 MDC:  58.21 meters (190.98 feet) or 50 meters (ANPTA Core Set of Outcome Measures for Adults with Neurologic Conditions, 2018)  -one trip with mod A to remain upright      PATIENT EDUCATION: Education details: Pt  educated throughout session about proper posture and technique with exercises. Improved exercise technique, movement at target joints, use of target muscles after min to mod verbal, visual, tactile cues.   Person educated: Patient Education method: Explanation, Demonstration, and Verbal cues Education comprehension: verbalized understanding and returned demonstration  HOME EXERCISE PROGRAM See pt instruction section   GOALS: Goals reviewed with patient? Yes  SHORT TERM GOALS: Target date: 03/10/2024    Patient will be independent in home exercise program to improve strength/mobility for better functional independence with ADLs. Baseline: to be initiated next 1-2 visits; 02/19/2023: pt not consistently performing HEP; 04/23/23:  Pt reports she goes to gym every three days; 8/7: Pt reports she has been increasing her activity levels, but currently waiting for caregiver to start to be able to go to gym; 01/28/2024:  exercises are going well per pt report Goal status: MET  2. The pt will report independence and consistency with gym program outside PT to continue making gains with strength and mobility  Baseline: pt to start going to gym soon 6/25: reports she will start next week  Goal status: In progress   LONG TERM GOALS: Target date: 07/07/2024       Patient will increase FOTO score to equal to or greater than  76  to demonstrate statistically significant improvement in mobility and quality of life.  Baseline: risk adjusted 51; 02/19/2023: 63 * pt reporting pain in left leg limiting stair climbing; 04/23/23: 81 Goal status: MET/DISCONTINUED  2.  Pt will reduce 5xsSTS time to < 8 sec to demonstrate clinically significant improvement in LE strength and balance to reduce risk of falls.  Baseline: previously 12.76 sec on 06/10/22, now 10 sec to RW on 10/22/2022; 02/19/2023 8.5 seconds hands-free; 04/23/23: 7 seconds; 10/29/23: 6.5 sec  goal status: MET  3.  Pt will improve 6 MWT to > 1000' with no AD and  no LOB to demonstrate ability to improve safety with community distances. Baseline: was 822 ft back in August 2023, now 808 with RW, CGA; 02/19/2023 845 ft with 5TT; 04/23/23: 820 ft 4WW; 05/28/23:  728 ft with RW, limited by LLE pain; 07/30/2023: 878 ft with RW; 10/08/23: 518 pt somewhat distracted throughout; 935 ft with no AD, CGA; 01/28/24 645 ft with RW (used as pt slightly unsteady today)  6/25: 1014 ft no AD  Goal status: MET  4.  Pt will improve 10 m gait speed with LRAD to > 1.0 m/s to demonstrate reduced risk of falls along with safe ability to complete community walking tasks Baseline: previously 0.7 m/s on 06/10/2022, now 0.77 m/s with RW; 02/19/2023: 0.82 m/s with RW; 04/23/23: 0.95 m/s with RW; 05/28/23: 0.83 m/s pain limited LLE; 10/9: 1.08 m/s with RW  Goal status: MET  5.  Patient will increase Berg Balance score by > 6 points to demonstrate decreased fall risk during functional activities.  Baseline: previously 35/56 on 03/11/22(?), test deferred to next appt; 11/13/2022: 36/56; 02/19/2023: 39/56; 04/23/23: 42/56  6/25: 52/56  Goal status: MET/ Progressed   6.  Pt will report improved stool consistency type 4 across 50% of the time and demo less flexed coccyx across 2 weeks in order to promote bowel elimination  Baseline: Pt has Type 1 stool consistency based on  Bristol stool scale 100% and her mom has to put boiled water to soften the stool to flush every time.  Coccyx flexed  Goal status: discontinued/pt not currently being seen by pelvic PT   7.   Patient will be independent with ascend/descend 12 steps using single UE  without LOB. Baseline: 6/25: BUE support step to pattern of negotiation for 5 steps  Goal status: NEW  8.  Pt will improve 6 MWT to > 1400' with no AD and no LOB to demonstrate ability to improve safety with community distances. Baseline:  6/25: 1014 ft no AD  Goal status: New   9.  Patient will increase Functional Gait Assessment score to >20/30 as to reduce fall risk and  improve dynamic gait safety with community ambulation. Baseline:  6/25: perform next session  Goal status: New   10.  Patient will increase lower extremity functional scale to >60/80 to demonstrate improved functional mobility and increased tolerance with ADLs.  Baseline:  6/25: 49 Goal status: New       ASSESSMENT:  CLINICAL IMPRESSION: Patient's condition has the potential to improve in response to therapy. Maximum improvement is yet to be obtained. The anticipated improvement is attainable and reasonable in a generally predictable time. Patient has met her 6 MWT goal without an AD allowing for progression of goal towards age norm.  BERG goal additionally is progressed towards a more functional goal range. New goals of FGA and LEFS added to POC with patient agreeing to POC. Additional goal of stairs added to POC due to need for patient to negotiate stairs at home. The pt will benefit from further skilled PT to improve balance, gait and mobility.   OBJECTIVE IMPAIRMENTS: Abnormal gait, decreased activity tolerance, decreased balance, decreased coordination, decreased endurance, decreased knowledge of use of DME, decreased mobility, difficulty walking, decreased strength, impaired tone, improper body mechanics, postural dysfunction, and pain.   ACTIVITY LIMITATIONS: carrying, lifting, bending, standing, squatting, stairs, transfers, dressing, and locomotion level  PARTICIPATION LIMITATIONS: meal prep, cleaning, shopping, community activity, and yard work  PERSONAL FACTORS: Fitness, Sex, Time since onset of injury/illness/exacerbation, and 3+ comorbidities: PMH consists of the following: CP, depression, GAD, migraine headache, hx of LE surgery bilateral x2 in 2008 are also affecting patient's functional outcome.   REHAB POTENTIAL: Good  CLINICAL DECISION MAKING: Evolving/moderate complexity  EVALUATION COMPLEXITY: Moderate  PLAN:  PT FREQUENCY: 1-2x/week  PT DURATION: 12  weeks  PLANNED INTERVENTIONS: Therapeutic exercises, Therapeutic activity, Neuromuscular re-education, Balance training, Gait training, Patient/Family education, Self Care, Joint mobilization, Stair training, Vestibular training, Canalith repositioning, Visual/preceptual remediation/compensation, Orthotic/Fit training, DME instructions, Dry Needling, Electrical stimulation, Wheelchair mobility training, Spinal mobilization, Cryotherapy, Moist heat, Splintting, Taping, Biofeedback, Manual therapy, and Re-evaluation  PLAN FOR NEXT SESSION: gait mechanics, improve step-width, strengthening BLE, hip abduction, stretching, manual RLE; instruction Self-stretching/tone management in standing, seated stretch ankles/toes, assess LTL approach with UE support with descending steps, continue plan  FGA:   Patricie Geeslin, PT 04/14/2024, 3:45 PM  3:45 PM, 04/14/24 Physical Therapist - [facility]

## 2024-04-15 ENCOUNTER — Ambulatory Visit: Payer: MEDICAID | Admitting: Internal Medicine

## 2024-04-21 ENCOUNTER — Ambulatory Visit: Payer: MEDICAID | Attending: Internal Medicine

## 2024-04-21 ENCOUNTER — Ambulatory Visit: Payer: MEDICAID

## 2024-04-21 DIAGNOSIS — M6281 Muscle weakness (generalized): Secondary | ICD-10-CM | POA: Insufficient documentation

## 2024-04-21 DIAGNOSIS — R278 Other lack of coordination: Secondary | ICD-10-CM | POA: Insufficient documentation

## 2024-04-21 DIAGNOSIS — G809 Cerebral palsy, unspecified: Secondary | ICD-10-CM | POA: Insufficient documentation

## 2024-04-21 DIAGNOSIS — R262 Difficulty in walking, not elsewhere classified: Secondary | ICD-10-CM | POA: Diagnosis present

## 2024-04-25 NOTE — Therapy (Signed)
 OUTPATIENT OCCUPATIONAL THERAPY NEURO TREATMENT NOTE  Patient Name: Renee Walton MRN: [id number] DOB:Apr 30, 1996, 28 y.o., female Today's Date: 04/25/2024   REFERRING PROVIDER: Fernand La, MD  END OF SESSION:  OT End of Session - 04/25/24 1639     Visit Number 8    Number of Visits 18    Date for OT Re-Evaluation 06/16/24    OT Start Time 1400    OT Stop Time 1445    OT Time Calculation (min) 45 min    Activity Tolerance Patient tolerated treatment well    Behavior During Therapy WFL for tasks assessed/performed          Past Medical History:  Diagnosis Date   Breakthrough bleeding    Cerebral palsy (HCC)    GAD (generalized anxiety disorder)    Migraine headache    Past Surgical History:  Procedure Laterality Date   LEG SURGERY     bilateral x2 in 2008   Patient Active Problem List   Diagnosis Date Noted   Seasonal allergic rhinitis due to pollen 03/27/2023   Bronchitis 03/13/2023   Gastroesophageal reflux disease without esophagitis 02/24/2023   Migraine without aura and without status migrainosus, not intractable 02/24/2023   GAD (generalized anxiety disorder) 02/24/2023   Loss of weight 07/17/2015   Depression 07/17/2015   Cerebral palsy (HCC) 07/17/2015   ONSET DATE: 1995  REFERRING DIAG: CP  THERAPY DIAG:  Muscle weakness (generalized)  Other lack of coordination  Cerebral palsy, unspecified type (HCC)  Rationale for Evaluation and Treatment: Rehabilitation  SUBJECTIVE:  SUBJECTIVE STATEMENT: Pt reports practicing at home with her electric scissors, but that her mom supervises. Pt accompanied by: self  PERTINENT HISTORY: Pt. is a 28 y.o. female with a diagnosis of CP. PMHx includes: GAD, Migraine headache,GERD, bilateral LE surgery x's 2 in 2008.  PRECAUTIONS: None  WEIGHT BEARING RESTRICTIONS: No  PAIN:  Are you having pain?  No reports of pain  FALLS: Has patient fallen in last 6 months? No  LIVING ENVIRONMENT: Lives with: lives  with their family Multiple family members Lives in: House/apartment Stairs: 2 floors-stays on the main floor,  Has following equipment at home: Vannie - 2 wheeled, Wheelchair (power), and Grab bars  PLOF:  Has a Nurse, adult.  PATIENT GOALS: To be able to tie her hair back, or try a new hairdo.  OBJECTIVE:  Note: Objective measures were completed at Evaluation unless otherwise noted.  HAND DOMINANCE: Left  ADLs:  Transfers/ambulation related to ADLs: Eating: Independent Grooming: Independent; assist required for hair care/ponytail UB Dressing: Independent LB Dressing: Independent, difficulty fastening pant button, soc Toileting: Mother assists  Bathing: Independent with washing, assist with drying. 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 Is working with PT  POSTURE COMMENTS:   Sitting balance: good supported  ACTIVITY TOLERANCE: Activity tolerance: Fair  FUNCTIONAL OUTCOME MEASURES: TBD  UPPER EXTREMITY ROM:    Active ROM Right Eval WFL Left Eval [facility]  Shoulder flexion    Shoulder abduction    Shoulder adduction    Shoulder extension    Shoulder internal rotation    Shoulder external rotation    Elbow flexion    Elbow extension    Wrist flexion  Wrist extension    Wrist ulnar deviation    Wrist radial deviation    Wrist pronation    Wrist supination    (Blank rows = not tested)  UPPER EXTREMITY MMT:     MMT Right Eval WFL Left Eval [facility]  Shoulder flexion    Shoulder abduction    Shoulder adduction    Shoulder extension    Shoulder internal rotation    Shoulder external rotation    Middle trapezius     Lower trapezius    Elbow flexion    Elbow extension    Wrist flexion    Wrist extension    Wrist ulnar deviation    Wrist radial deviation    Wrist pronation    Wrist supination    (Blank rows = not tested)  HAND FUNCTION: Grip strength: Right: 13 lbs; Left: 18 lbs, Lateral pinch: Right: 3 lbs, Left: 8 lbs, and 3 point pinch: Right: 8 lbs, Left: 8 lbs 03/24/24: Right: 35 lbs; 41 lbs; Lateral pinch: Right: 5 lbs, Left: 6 lbs, 3 point pinch: Right: 8 lbs, Left: 9 lbs  COORDINATION: 9 Hole Peg test: Right: 1 min. & 17 sec; Left: 37 sec 03/24/24: R: 1 min 6 sec;  L 39 sec,  SENSATION: WFL light touch, and proprioceptive awareness  COGNITION: Overall cognitive status: Within functional limits for tasks assessed  VISION: Subjective report: No change form baseline  PERCEPTION: WFL  PRAXIS: TBD                                                                                                                      TREATMENT DATE: 04/21/2024  Therapeutic Activity: -Practice trials with use of electric scissors, simulating cutting around shapes necessary to make tshirts with mom at home. -Max vc and demo for hand positioning and rotating cardboard pieces in order to more easily cut on a straight line  Self Care: -Review of scissor safety and positioning scissors to cut away from body -Reinforced practice trials at home with mom present -Instruction in scissor set up and putting away with scissor guards in place   PATIENT EDUCATION: Education details: safety with electric scissors/strategies to improve cutting on straight line Person educated: Patient Education method: Programmer, multimedia, demo Education comprehension: needs further education  HOME EXERCISE PROGRAM: Theraputty  GOALS: Goals reviewed with patient? Yes  SHORT TERM GOALS: Target date: 02/11/2024   Pt. Will be independent with BUE HEP programs. Baseline:EVal: No current HEP; 03/24/24: pt reports using her putty 2x daily   Goal status: achieved  LONG TERM GOALS: Target date: 06/16/2024    Pt. Will independently be able to use scissors to cut a straight line. Baseline: Eval: Pt. Has difficulty using scissors to cut a straight line; 03/24/24: Decreased coordination limits ability to cut on a straight line; pt is now adapting cutting her tshirts with her new cricut machine Goal status: d/c  2.  Pt. Will fix her hair in an updo with modified independence Baseline: Eval: Pt. Is  unable to tie her hair into a ponytail; 03/24/24: Can do but messy/loose Goal status: ongoing  3.  Pt. Will independently perform nailcare. Baseline: Eval: Pt. has difficulty completing nail care; 03/24/24: strategies not yet addressed  Goal status: INITIAL  4.  Pt. Will independently, and accurately locate the middle, and center shirts for screen printing. Baseline: Eval: Pt. Has difficulty with locating the middle, and centering shirts for screen printing; 03/24/24: Pt is using a plastic screen overlay while maps out middle of shirt Goal status: achieved  5.  Pt. Will demonstrate compensatory strategies for home management tasks. Baseline: Eval: Education to be provided about compensatory strategies; 03/24/24: Pt reports her HHA assists her to use her kitchen aid and air fryer; strategies not yet addressed in OT sessions Goal status: ongoing  6. Pt. Will improve right hand [facility] skills by 5 sec. of speed to be able to fasten her pants.  Baseline: Eval: R: 1 min. & 17 sec. L: 37 sec. Pt. Has difficulty fastening her pants; 03/24/24: R: 1 min 6 sec;  L 39 sec; continued difficulty   Goal status: improved/ongoing   ASSESSMENT: CLINICAL IMPRESSION: Practice trials with pt's new electric scissors, working to cut out basic shapes to simulate cutting required during tshirt making process at home.  Pt able to successfully cut through thin cardboard with electric scissors, but requires max vc, visual cues, and demo for cutting on desired lines and for  maintaining safety with scissors.  Pt understands that her scissors are recommended to be used only with supv, which mother does provide at home.  Pt is unable to maintain cutting on a straight line, but reports that she does not have to be exact at home, as she only cuts away excess material when making tshirts.  Pt responded well to a pencil drawn line around a shape to simulate cutting excess from a tshirt, but still required multimodal cueing for positioning scissors and rotating cardboard to ease cutting safety and accuracy.  Pt. continues to benefit from OT services to work towards meeting unmet goals listed above to improve functional performance with ADLs/IADLs, to maximize independence, and to decrease dependence on caregivers.   PERFORMANCE DEFICITS: in functional skills including ADLs, IADLs, coordination, dexterity, strength, Fine motor control, Gross motor control, decreased knowledge of use of DME, and UE functional use, cognitive skills including , and psychosocial skills including coping strategies, environmental adaptation, and routines and behaviors.   IMPAIRMENTS: are limiting patient from ADLs, IADLs, and leisure.   CO-MORBIDITIES: may have co-morbidities  that affects occupational performance. Patient will benefit from skilled OT to address above impairments and improve overall function.  MODIFICATION OR ASSISTANCE TO COMPLETE EVALUATION: Min-Moderate modification of tasks or assist with assess necessary to complete an evaluation.  OT OCCUPATIONAL PROFILE AND HISTORY: Detailed assessment: Review of records and additional review of physical, cognitive, psychosocial history related to current functional performance.  CLINICAL DECISION MAKING: Moderate - several treatment options, min-mod task modification necessary  REHAB POTENTIAL: Good  EVALUATION COMPLEXITY: Moderate    PLAN:  OT FREQUENCY: 1x/week  OT DURATION: 12 weeks  PLANNED INTERVENTIONS: 97168 OT Re-evaluation,  97535 self care/ADL training, 02889 therapeutic exercise, 97530 therapeutic activity, 97112 neuromuscular re-education, 97140 manual therapy, 97018 paraffin, 02989 moist heat, 97034 contrast bath, 97760 Orthotics management and training, 02239 Splinting (initial encounter), functional mobility training, visual/perceptual remediation/compensation, patient/family education, and DME and/or AE instructions  RECOMMENDED OTHER SERVICES: PT  CONSULTED AND AGREED WITH PLAN OF CARE: Patient  PLAN FOR  NEXT SESSION: Treatment  Inocente Blazing, MS, OTR/L  04/25/2024, 4:40 PM

## 2024-04-28 ENCOUNTER — Ambulatory Visit: Payer: MEDICAID

## 2024-05-05 ENCOUNTER — Ambulatory Visit: Payer: MEDICAID

## 2024-05-05 DIAGNOSIS — R278 Other lack of coordination: Secondary | ICD-10-CM

## 2024-05-05 DIAGNOSIS — G809 Cerebral palsy, unspecified: Secondary | ICD-10-CM

## 2024-05-05 DIAGNOSIS — M6281 Muscle weakness (generalized): Secondary | ICD-10-CM | POA: Diagnosis not present

## 2024-05-09 NOTE — Therapy (Signed)
 OUTPATIENT OCCUPATIONAL THERAPY NEURO TREATMENT NOTE  Patient Name: Renee Walton MRN: [id number] DOB:1995-10-31, 28 y.o., female Today's Date: 05/09/2024   REFERRING PROVIDER: Fernand La, MD  END OF SESSION:  OT End of Session - 05/09/24 1237     Visit Number 9    Number of Visits 18    Date for OT Re-Evaluation 06/16/24    OT Start Time 1400    OT Stop Time 1445    OT Time Calculation (min) 45 min    Activity Tolerance Patient tolerated treatment well    Behavior During Therapy WFL for tasks assessed/performed         Past Medical History:  Diagnosis Date   Breakthrough bleeding    Cerebral palsy (HCC)    GAD (generalized anxiety disorder)    Migraine headache    Past Surgical History:  Procedure Laterality Date   LEG SURGERY     bilateral x2 in 2008   Patient Active Problem List   Diagnosis Date Noted   Seasonal allergic rhinitis due to pollen 03/27/2023   Bronchitis 03/13/2023   Gastroesophageal reflux disease without esophagitis 02/24/2023   Migraine without aura and without status migrainosus, not intractable 02/24/2023   GAD (generalized anxiety disorder) 02/24/2023   Loss of weight 07/17/2015   Depression 07/17/2015   Cerebral palsy (HCC) 07/17/2015   ONSET DATE: 1995  REFERRING DIAG: CP  THERAPY DIAG:  Muscle weakness (generalized)  Other lack of coordination  Cerebral palsy, unspecified type (HCC)  Rationale for Evaluation and Treatment: Rehabilitation  SUBJECTIVE:  SUBJECTIVE STATEMENT: Pt requests more practice with her electric scissors, as she states she tends to cut into the shapes rather than around them when making her tshirts.  Pt accompanied by: self  PERTINENT HISTORY: Pt. is a 28 y.o. female with a diagnosis of CP. PMHx includes: GAD, Migraine headache,GERD, bilateral LE surgery x's 2 in 2008.  PRECAUTIONS: None  WEIGHT BEARING RESTRICTIONS: No  PAIN:  Are you having pain?  No reports of pain  FALLS: Has patient fallen  in last 6 months? No  LIVING ENVIRONMENT: Lives with: lives with their family Multiple family members Lives in: House/apartment Stairs: 2 floors-stays on the main floor,  Has following equipment at home: Vannie - 2 wheeled, Wheelchair (power), and Grab bars  PLOF:  Has a Nurse, adult.  PATIENT GOALS: To be able to tie her hair back, or try a new hairdo.  OBJECTIVE:  Note: Objective measures were completed at Evaluation unless otherwise noted.  HAND DOMINANCE: Left  ADLs:  Transfers/ambulation related to ADLs: Eating: Independent Grooming: Independent; assist required for hair care/ponytail UB Dressing: Independent LB Dressing: Independent, difficulty fastening pant button, soc Toileting: Mother assists  Bathing: Independent with washing, assist with drying. 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 Is working with PT  POSTURE COMMENTS:   Sitting balance: good supported  ACTIVITY TOLERANCE: Activity tolerance: Fair  FUNCTIONAL OUTCOME MEASURES: TBD  UPPER EXTREMITY ROM:    Active ROM Right Eval WFL Left Eval [facility]  Shoulder flexion    Shoulder abduction    Shoulder adduction    Shoulder extension    Shoulder internal rotation    Shoulder external rotation  Elbow flexion    Elbow extension    Wrist flexion    Wrist extension    Wrist ulnar deviation    Wrist radial deviation    Wrist pronation    Wrist supination    (Blank rows = not tested)  UPPER EXTREMITY MMT:     MMT Right Eval WFL Left Eval [facility]  Shoulder flexion    Shoulder abduction    Shoulder adduction    Shoulder extension    Shoulder  internal rotation    Shoulder external rotation    Middle trapezius    Lower trapezius    Elbow flexion    Elbow extension    Wrist flexion    Wrist extension    Wrist ulnar deviation    Wrist radial deviation    Wrist pronation    Wrist supination    (Blank rows = not tested)  HAND FUNCTION: Grip strength: Right: 13 lbs; Left: 18 lbs, Lateral pinch: Right: 3 lbs, Left: 8 lbs, and 3 point pinch: Right: 8 lbs, Left: 8 lbs 03/24/24: Right: 35 lbs; 41 lbs; Lateral pinch: Right: 5 lbs, Left: 6 lbs, 3 point pinch: Right: 8 lbs, Left: 9 lbs  COORDINATION: 9 Hole Peg test: Right: 1 min. & 17 sec; Left: 37 sec 03/24/24: R: 1 min 6 sec;  L 39 sec,  SENSATION: WFL light touch, and proprioceptive awareness  COGNITION: Overall cognitive status: Within functional limits for tasks assessed  VISION: Subjective report: No change form baseline  PERCEPTION: WFL  PRAXIS: TBD                                                                                                                      TREATMENT DATE: 05/05/2024  Therapeutic Activity: -Practice trials with use of electric scissors, cutting around shapes necessary to make tshirts with mom at home. -Mod-max vc and demo for hand positioning and rotating cardboard and cricut pieces in order to more easily cut on a straight line  Self Care: -Review of scissor safety and positioning scissors to cut away from body -Reinforced practice trials at home with mom present at all times -Practice reps of tying knots for pt to increase indep with making bracelets at home; mod-max vc for sequencing and carryover   PATIENT EDUCATION: Education details: safety with electric scissors/strategies to improve cutting on straight line Person educated: Patient Education method: Programmer, multimedia, demo Education comprehension: needs further education  HOME EXERCISE PROGRAM: Theraputty  GOALS: Goals reviewed with patient? Yes  SHORT TERM GOALS: Target date:  02/11/2024   Pt. Will be independent with BUE HEP programs. Baseline:EVal: No current HEP; 03/24/24: pt reports using her putty 2x daily  Goal status: achieved  LONG TERM GOALS: Target date: 06/16/2024    Pt. Will independently be able to use scissors to cut a straight line. Baseline: Eval: Pt. Has difficulty using scissors to cut a straight line; 03/24/24: Decreased coordination limits ability to cut on a straight line; pt is now adapting cutting her  tshirts with her new cricut machine Goal status: d/c  2.  Pt. Will fix her hair in an updo with modified independence Baseline: Eval: Pt. Is unable to tie her hair into a ponytail; 03/24/24: Can do but messy/loose Goal status: ongoing  3.  Pt. Will independently perform nailcare. Baseline: Eval: Pt. has difficulty completing nail care; 03/24/24: strategies not yet addressed  Goal status: INITIAL  4.  Pt. Will independently, and accurately locate the middle, and center shirts for screen printing. Baseline: Eval: Pt. Has difficulty with locating the middle, and centering shirts for screen printing; 03/24/24: Pt is using a plastic screen overlay while maps out middle of shirt Goal status: achieved  5.  Pt. Will demonstrate compensatory strategies for home management tasks. Baseline: Eval: Education to be provided about compensatory strategies; 03/24/24: Pt reports her HHA assists her to use her kitchen aid and air fryer; strategies not yet addressed in OT sessions Goal status: ongoing  6. Pt. Will improve right hand [facility] skills by 5 sec. of speed to be able to fasten her pants.  Baseline: Eval: R: 1 min. & 17 sec. L: 37 sec. Pt. Has difficulty fastening her pants; 03/24/24: R: 1 min 6 sec;  L 39 sec; continued difficulty   Goal status: improved/ongoing   ASSESSMENT: CLINICAL IMPRESSION: Additional trials with pt's new electric scissors, working to cut out basic shapes to complete cutting required during tshirt making process at home.  Pt continues to  require direct supv for scissor safety to avoid cutting towards her R hand, and mod-max vc for positioning paper and lining up scissors to cut around shapes.  Without direct supv for scissor and paper set up, pt tends to cut through her shapes instead of around them.  Continued practice reps of tying knots, with pt requiring mod-max sequencing cues.  Limited carryover with scissor technique and tying technique from last session, despite high reps and direct supv and sequencing cues.  Anticipate ongoing supv needed for both of these tasks d/t limited new carryover or new learning with multi-step tasks.  Anticipate d/c next visit d/t insurance visit limitations.  Communicated this to pt and mother and both verbalized understanding.  Pt. continues to benefit from OT services to work towards meeting unmet goals listed above to improve functional performance with ADLs/IADLs, to maximize independence, and to decrease dependence on caregivers.   PERFORMANCE DEFICITS: in functional skills including ADLs, IADLs, coordination, dexterity, strength, Fine motor control, Gross motor control, decreased knowledge of use of DME, and UE functional use, cognitive skills including , and psychosocial skills including coping strategies, environmental adaptation, and routines and behaviors.   IMPAIRMENTS: are limiting patient from ADLs, IADLs, and leisure.   CO-MORBIDITIES: may have co-morbidities  that affects occupational performance. Patient will benefit from skilled OT to address above impairments and improve overall function.  MODIFICATION OR ASSISTANCE TO COMPLETE EVALUATION: Min-Moderate modification of tasks or assist with assess necessary to complete an evaluation.  OT OCCUPATIONAL PROFILE AND HISTORY: Detailed assessment: Review of records and additional review of physical, cognitive, psychosocial history related to current functional performance.  CLINICAL DECISION MAKING: Moderate - several treatment options,  min-mod task modification necessary  REHAB POTENTIAL: Good  EVALUATION COMPLEXITY: Moderate    PLAN:  OT FREQUENCY: 1x/week  OT DURATION: 12 weeks  PLANNED INTERVENTIONS: 97168 OT Re-evaluation, 97535 self care/ADL training, 02889 therapeutic exercise, 97530 therapeutic activity, 97112 neuromuscular re-education, 97140 manual therapy, 97018 paraffin, 02989 moist heat, 97034 contrast bath, 97760 Orthotics management and training,  02239 Splinting (initial encounter), functional mobility training, visual/perceptual remediation/compensation, patient/family education, and DME and/or AE instructions  RECOMMENDED OTHER SERVICES: PT  CONSULTED AND AGREED WITH PLAN OF CARE: Patient  PLAN FOR NEXT SESSION: Treatment  Inocente Blazing, MS, OTR/L  05/09/2024, 12:38 PM

## 2024-05-12 ENCOUNTER — Ambulatory Visit: Payer: MEDICAID

## 2024-05-12 DIAGNOSIS — R278 Other lack of coordination: Secondary | ICD-10-CM

## 2024-05-12 DIAGNOSIS — M6281 Muscle weakness (generalized): Secondary | ICD-10-CM | POA: Diagnosis not present

## 2024-05-12 DIAGNOSIS — G809 Cerebral palsy, unspecified: Secondary | ICD-10-CM

## 2024-05-12 DIAGNOSIS — R262 Difficulty in walking, not elsewhere classified: Secondary | ICD-10-CM

## 2024-05-12 NOTE — Therapy (Signed)
 OUTPATIENT PHYSICAL THERAPY NEURO TREATMENT NOTE   Patient Name: Kelicia Youtz MRN: [id number] DOB:08-14-1996, 28 y.o., female Today's Date: 05/12/2024  PCP: Fernand Fredy RAMAN, MD REFERRING PROVIDER:   Fernand Fredy RAMAN, MD    END OF SESSION:  PT End of Session - 05/12/24 1442     Visit Number 41    Number of Visits 52    Date for PT Re-Evaluation 07/07/24    Authorization Type Medicaid [place] Access    Authorization Time Period 02/25/22-06/02/22 for 12 visits    PT Start Time 1445    PT Stop Time 1526    PT Time Calculation (min) 41 min    Equipment Utilized During Treatment Gait belt    Activity Tolerance Patient tolerated treatment well    Behavior During Therapy WFL for tasks assessed/performed          Past Medical History:  Diagnosis Date   Breakthrough bleeding    Cerebral palsy (HCC)    GAD (generalized anxiety disorder)    Migraine headache    Past Surgical History:  Procedure Laterality Date   LEG SURGERY     bilateral x2 in 2008   Patient Active Problem List   Diagnosis Date Noted   Seasonal allergic rhinitis due to pollen 03/27/2023   Bronchitis 03/13/2023   Gastroesophageal reflux disease without esophagitis 02/24/2023   Migraine without aura and without status migrainosus, not intractable 02/24/2023   GAD (generalized anxiety disorder) 02/24/2023   Loss of weight 07/17/2015   Depression 07/17/2015   Cerebral palsy (HCC) 07/17/2015    ONSET DATE: 07-12-94 pt has CP  REFERRING DIAG:   G80.9 (ICD-10-CM) - Cerebral palsy (HCC)    THERAPY DIAG:  Muscle weakness (generalized)  Other lack of coordination  Cerebral palsy, unspecified type (HCC)  Difficulty in walking, not elsewhere classified  Rationale for Evaluation and Treatment: Rehabilitation  SUBJECTIVE:                                                                                                                                                                                              SUBJECTIVE STATEMENT   Patient reports pain in L thigh this date and contributes it from a panic attack she had on Monday.   Pt accompanied by: self  PERTINENT HISTORY:    Pt is a pleasant 28 y/o female with CP who is returning to PT after absence and trip to [place]. She would like to improve her mobility, gait and balance. Pt has continued her PT exercises while away and worked on gait, and wants to become independent using a RW. She reports 2 recent  falls, and no injuries. Pt also with stiffness in BLEs. PMH consists of the following: CP, depression, GAD, migraine headache, hx of LE surgery bilateral x2 in 2008.    PAIN: from eval Are you having pain? No  PRECAUTIONS: Fall  WEIGHT BEARING RESTRICTIONS: No  FALLS: Has patient fallen in last 6 months? Yes. Number of falls 2 when walking. She reports no injury. She got back up independently   PATIENT GOALS: Pt would like to improve her ability to ambulate with RW both inside and over outside surfaces in order to increase her independence with mobility. She would like to learn how to safely get in and out of a car with her RW.  OBJECTIVE:   Today's Treatment: 05/12/24  Gait belt donned and CGA provided for upright activities unless otherwise noted  FGA: 5/30  Seated hamstring stretch x 60 seconds each LE   Stairs alt step 6 step x multiple reps each LE- cuing for technique to improve foot clearance   Sit to stand 2 x 10 with no UE support   LTL stepping in at bar with UUE support 6x length of bars  Standing marching at bar with BUE support x 10 each    PATIENT EDUCATION: Education details: Pt educated throughout session about proper posture and technique with exercises. Improved exercise technique, movement at target joints, use of target muscles after min to mod verbal, visual, tactile cues.   Person educated: Patient Education method: Explanation, Demonstration, and Verbal cues Education comprehension: verbalized  understanding and returned demonstration  HOME EXERCISE PROGRAM See pt instruction section   GOALS: Goals reviewed with patient? Yes  SHORT TERM GOALS: Target date: 03/10/2024  Patient will be independent in home exercise program to improve strength/mobility for better functional independence with ADLs. Baseline: to be initiated next 1-2 visits; 02/19/2023: pt not consistently performing HEP; 04/23/23:  Pt reports she goes to gym every three days; 8/7: Pt reports she has been increasing her activity levels, but currently waiting for caregiver to start to be able to go to gym; 01/28/2024:  exercises are going well per pt report Goal status: MET  2. The pt will report independence and consistency with gym program outside PT to continue making gains with strength and mobility  Baseline: pt to start going to gym soon 6/25: reports she will start next week  Goal status: In progress   LONG TERM GOALS: Target date: 07/07/2024  Patient will increase FOTO score to equal to or greater than  76  to demonstrate statistically significant improvement in mobility and quality of life.  Baseline: risk adjusted 51; 02/19/2023: 63 * pt reporting pain in left leg limiting stair climbing; 04/23/23: 81 Goal status: MET/DISCONTINUED  2.  Pt will reduce 5xsSTS time to < 8 sec to demonstrate clinically significant improvement in LE strength and balance to reduce risk of falls.  Baseline: previously 12.76 sec on 06/10/22, now 10 sec to RW on 10/22/2022; 02/19/2023 8.5 seconds hands-free; 04/23/23: 7 seconds; 10/29/23: 6.5 sec  goal status: MET  3.  Pt will improve 6 MWT to > 1000' with no AD and no LOB to demonstrate ability to improve safety with community distances. Baseline: was 822 ft back in August 2023, now 808 with RW, CGA; 02/19/2023 845 ft with 5TT; 04/23/23: 820 ft 4WW; 05/28/23:  728 ft with RW, limited by LLE pain; 07/30/2023: 878 ft with RW; 10/08/23: 518 pt somewhat distracted throughout; 935 ft with no AD, CGA; 01/28/24 645 ft  with RW (  used as pt slightly unsteady today)  6/25: 1014 ft no AD  Goal status: MET  4.  Pt will improve 10 m gait speed with LRAD to > 1.0 m/s to demonstrate reduced risk of falls along with safe ability to complete community walking tasks Baseline: previously 0.7 m/s on 06/10/2022, now 0.77 m/s with RW; 02/19/2023: 0.82 m/s with RW; 04/23/23: 0.95 m/s with RW; 05/28/23: 0.83 m/s pain limited LLE; 10/9: 1.08 m/s with RW  Goal status: MET  5.  Patient will increase Berg Balance score by > 6 points to demonstrate decreased fall risk during functional activities.  Baseline: previously 35/56 on 03/11/22(?), test deferred to next appt; 11/13/2022: 36/56; 02/19/2023: 39/56; 04/23/23: 42/56  6/25: 52/56  Goal status: MET/ Progressed   6.  Pt will report improved stool consistency type 4 across 50% of the time and demo less flexed coccyx across 2 weeks in order to promote bowel elimination  Baseline: Pt has Type 1 stool consistency based on Bristol stool scale 100% and her mom has to put boiled water to soften the stool to flush every time.  Coccyx flexed  Goal status: discontinued/pt not currently being seen by pelvic PT   7.   Patient will be independent with ascend/descend 12 steps using single UE  without LOB. Baseline: 6/25: BUE support step to pattern of negotiation for 5 steps  Goal status: NEW  8.  Pt will improve 6 MWT to > 1400' with no AD and no LOB to demonstrate ability to improve safety with community distances. Baseline:  6/25: 1014 ft no AD  Goal status: New   9.  Patient will increase Functional Gait Assessment score to >20/30 as to reduce fall risk and improve dynamic gait safety with community ambulation. Baseline:  6/25: perform next session; 7/23: 5/30 Goal status: New   10.  Patient will increase lower extremity functional scale to >60/80 to demonstrate improved functional mobility and increased tolerance with ADLs.  Baseline:  6/25: 49 Goal status: New     ASSESSMENT:  CLINICAL IMPRESSION:  Patient arrives to treatment session motivated to participate. FGA completed with score of 5/30 indicating high fall risk. Session focused on dynamic stepping and balance. Tolerated session well. Patient has 3 remaining visits based on insurance and will follow up with PT after botox injection on September 5. The pt will benefit from further skilled PT to improve balance, gait and mobility.   OBJECTIVE IMPAIRMENTS: Abnormal gait, decreased activity tolerance, decreased balance, decreased coordination, decreased endurance, decreased knowledge of use of DME, decreased mobility, difficulty walking, decreased strength, impaired tone, improper body mechanics, postural dysfunction, and pain.   ACTIVITY LIMITATIONS: carrying, lifting, bending, standing, squatting, stairs, transfers, dressing, and locomotion level  PARTICIPATION LIMITATIONS: meal prep, cleaning, shopping, community activity, and yard work  PERSONAL FACTORS: Fitness, Sex, Time since onset of injury/illness/exacerbation, and 3+ comorbidities: PMH consists of the following: CP, depression, GAD, migraine headache, hx of LE surgery bilateral x2 in 2008 are also affecting patient's functional outcome.   REHAB POTENTIAL: Good  CLINICAL DECISION MAKING: Evolving/moderate complexity  EVALUATION COMPLEXITY: Moderate  PLAN:  PT FREQUENCY: 1-2x/week  PT DURATION: 12 weeks  PLANNED INTERVENTIONS: Therapeutic exercises, Therapeutic activity, Neuromuscular re-education, Balance training, Gait training, Patient/Family education, Self Care, Joint mobilization, Stair training, Vestibular training, Canalith repositioning, Visual/preceptual remediation/compensation, Orthotic/Fit training, DME instructions, Dry Needling, Electrical stimulation, Wheelchair mobility training, Spinal mobilization, Cryotherapy, Moist heat, Splintting, Taping, Biofeedback, Manual therapy, and Re-evaluation  PLAN FOR NEXT SESSION:  gait mechanics, improve  step-width, strengthening BLE, hip abduction, stretching, manual RLE; instruction Self-stretching/tone management in standing, seated stretch ankles/toes, assess LTL approach with UE support with descending steps, continue plan  Maryanne Finder, PT, DPT Physical Therapist - [place]  [facility] 2:43 PM, 05/12/24

## 2024-05-19 ENCOUNTER — Ambulatory Visit: Payer: MEDICAID | Admitting: Occupational Therapy

## 2024-05-25 ENCOUNTER — Ambulatory Visit: Payer: MEDICAID | Admitting: Podiatry

## 2024-05-26 ENCOUNTER — Ambulatory Visit: Payer: MEDICAID

## 2024-06-07 ENCOUNTER — Ambulatory Visit: Payer: MEDICAID | Admitting: Internal Medicine

## 2024-06-09 ENCOUNTER — Ambulatory Visit: Payer: MEDICAID

## 2024-06-22 ENCOUNTER — Other Ambulatory Visit: Payer: Self-pay | Admitting: Internal Medicine

## 2024-06-22 MED ORDER — ESCITALOPRAM OXALATE 5 MG PO TABS: 5.0000 mg | ORAL_TABLET | Freq: Every day | ORAL | 1 refills | Status: AC

## 2024-06-23 ENCOUNTER — Ambulatory Visit: Payer: MEDICAID

## 2024-06-29 ENCOUNTER — Ambulatory Visit: Payer: MEDICAID | Admitting: Internal Medicine

## 2024-06-30 ENCOUNTER — Ambulatory Visit: Payer: MEDICAID | Attending: Internal Medicine

## 2024-06-30 DIAGNOSIS — G809 Cerebral palsy, unspecified: Secondary | ICD-10-CM | POA: Diagnosis present

## 2024-06-30 DIAGNOSIS — M6281 Muscle weakness (generalized): Secondary | ICD-10-CM | POA: Insufficient documentation

## 2024-06-30 DIAGNOSIS — R278 Other lack of coordination: Secondary | ICD-10-CM | POA: Insufficient documentation

## 2024-06-30 NOTE — Therapy (Unsigned)
 OUTPATIENT OCCUPATIONAL THERAPY NEURO TREATMENT NOTE  Patient Name: Renee Walton MRN: [id number] DOB:1996/10/15, 28 y.o., female Today's Date: 06/30/2024   REFERRING PROVIDER: Fernand La, MD  END OF SESSION:   Past Medical History:  Diagnosis Date   Breakthrough bleeding    Cerebral palsy (HCC)    GAD (generalized anxiety disorder)    Migraine headache    Past Surgical History:  Procedure Laterality Date   LEG SURGERY     bilateral x2 in 2008   Patient Active Problem List   Diagnosis Date Noted   Seasonal allergic rhinitis due to pollen 03/27/2023   Bronchitis 03/13/2023   Gastroesophageal reflux disease without esophagitis 02/24/2023   Migraine without aura and without status migrainosus, not intractable 02/24/2023   GAD (generalized anxiety disorder) 02/24/2023   Loss of weight 07/17/2015   Depression 07/17/2015   Cerebral palsy (HCC) 07/17/2015   ONSET DATE: 1995  REFERRING DIAG: CP  THERAPY DIAG:  No diagnosis found.  Rationale for Evaluation and Treatment: Rehabilitation  SUBJECTIVE:  SUBJECTIVE STATEMENT: Quitting cricut,  Pt accompanied by: self  PERTINENT HISTORY: Pt. is a 28 y.o. female with a diagnosis of CP. PMHx includes: GAD, Migraine headache,GERD, bilateral LE surgery x's 2 in 2008.  PRECAUTIONS: None  WEIGHT BEARING RESTRICTIONS: No  PAIN:  Are you having pain?  No reports of pain  FALLS: Has patient fallen in last 6 months? No  LIVING ENVIRONMENT: Lives with: lives with their family Multiple family members Lives in: House/apartment Stairs: 2 floors-stays on the main floor,  Has following equipment at home: Vannie - 2 wheeled, Wheelchair (power), and Grab bars  PLOF:  Has a Nurse, adult.  PATIENT GOALS: To be able to tie her hair back, or try a new hairdo.  OBJECTIVE:  Note: Objective measures were completed at Evaluation unless otherwise noted.  HAND DOMINANCE: Left  ADLs:  Transfers/ambulation related to  ADLs: Eating: Independent Grooming: Independent; assist required for hair care/ponytail UB Dressing: Independent LB Dressing: Independent, difficulty fastening pant button, soc Toileting: Mother assists  Bathing: Independent with washing, assist with drying. 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 Is working with PT  POSTURE COMMENTS:   Sitting balance: good supported  ACTIVITY TOLERANCE: Activity tolerance: Fair  FUNCTIONAL OUTCOME MEASURES: TBD  UPPER EXTREMITY ROM:    Active ROM Right Eval WFL Left Eval [facility]  Shoulder flexion    Shoulder abduction    Shoulder adduction    Shoulder extension    Shoulder internal rotation    Shoulder external rotation    Elbow flexion    Elbow extension    Wrist flexion    Wrist extension    Wrist ulnar deviation    Wrist radial deviation    Wrist pronation    Wrist supination    (Blank rows = not tested)  UPPER EXTREMITY MMT:     MMT Right Eval [facility] Left Eval [facility]  Shoulder flexion    Shoulder abduction    Shoulder adduction    Shoulder extension    Shoulder internal rotation    Shoulder external rotation    Middle trapezius    Lower trapezius    Elbow flexion    Elbow extension  Wrist flexion    Wrist extension    Wrist ulnar deviation    Wrist radial deviation    Wrist pronation    Wrist supination    (Blank rows = not tested)  HAND FUNCTION: Grip strength: Right: 13 lbs; Left: 18 lbs, Lateral pinch: Right: 3 lbs, Left: 8 lbs, and 3 point pinch: Right: 8 lbs, Left: 8 lbs 03/24/24: Right: 35 lbs; 41 lbs; Lateral pinch: Right: 5 lbs, Left: 6 lbs, 3 point pinch: Right: 8  lbs, Left: 9 lbs 06/30/24: Right: 30 lbs, Left: 41 lbs; Lateral pinch: Right: 8 lbs, Left: 7 lbs; 3 point pinch: Right: 6 lbs, Left: 7 lbs    COORDINATION: 9 Hole Peg test: Right: 1 min. & 17 sec; Left: 37 sec 03/24/24: R: 1 min 6 sec;  L 39 sec 06/30/24: Right: 56 sec; Left:  32 sec   SENSATION: WFL light touch, and proprioceptive awareness  COGNITION: Overall cognitive status: Within functional limits for tasks assessed  VISION: Subjective report: No change form baseline  PERCEPTION: WFL  PRAXIS: TBD                                                                                                                      TREATMENT DATE: 05/05/2024  Therapeutic Activity: -Practice trials with use of electric scissors, cutting around shapes necessary to make tshirts with mom at home. -Mod-max vc and demo for hand positioning and rotating cardboard and cricut pieces in order to more easily cut on a straight line  Self Care: -Review of scissor safety and positioning scissors to cut away from body -Reinforced practice trials at home with mom present at all times -Practice reps of tying knots for pt to increase indep with making bracelets at home; mod-max vc for sequencing and carryover   PATIENT EDUCATION: Education details: safety with electric scissors/strategies to improve cutting on straight line Person educated: Patient Education method: Programmer, multimedia, demo Education comprehension: needs further education  HOME EXERCISE PROGRAM: Theraputty  GOALS: Goals reviewed with patient? Yes  SHORT TERM GOALS: Target date: 02/11/2024   Pt. Will be independent with BUE HEP programs. Baseline:EVal: No current HEP; 03/24/24: pt reports using her putty 2x daily  Goal status: achieved  LONG TERM GOALS: Target date: 06/16/2024    Pt. Will independently be able to use scissors to cut a straight line. Baseline: Eval: Pt. Has difficulty using scissors to cut a straight line; 03/24/24: Decreased  coordination limits ability to cut on a straight line; pt is now adapting cutting her tshirts with her new cricut machine Goal status: d/c  2.  Pt. Will fix her hair in an updo with modified independence Baseline: Eval: Pt. Is unable to tie her hair into a ponytail; 03/24/24: Can do but messy/loose; 06/30/24: pt practices putting hair up at home, and mom assists for community outings as pt still struggles to do it without it being messy Goal status: ongoing  3.  Pt. Will independently perform nailcare. Baseline: Eval: Pt. has  difficulty completing nail care; 03/24/24: strategies not yet addressed; 06/30/24: mom trims nails  Goal status: INITIAL  4.  Pt. Will independently, and accurately locate the middle, and center shirts for screen printing. Baseline: Eval: Pt. Has difficulty with locating the middle, and centering shirts for screen printing; 03/24/24: Pt is using a plastic screen overlay while maps out middle of shirt Goal status: achieved  5.  Pt. Will demonstrate compensatory strategies for home management tasks. Baseline: Eval: Education to be provided about compensatory strategies; 03/24/24: Pt reports her HHA assists her to use her kitchen aid and air fryer; strategies not yet addressed in OT sessions; 06/30/24: pt reports that she helps to pick up after her nephews and pt often furniture walks; fall prevention reviewed keeping pathways clear pathways, washes dishes   Goal status: ongoing  6. Pt. Will improve right hand [facility] skills by 5 sec. of speed to be able to fasten her pants.  Baseline: Eval: R: 1 min. & 17 sec. L: 37 sec. Pt. Has difficulty fastening her pants; 03/24/24: R: 1 min 6 sec;  L 39 sec; continued difficulty   Goal status: improved/ongoing   ASSESSMENT: drying hair independently, elastic laces;  CLINICAL IMPRESSION: Additional trials with pt's new electric scissors, working to cut out basic shapes to complete cutting required during tshirt making process at home.  Pt continues  to require direct supv for scissor safety to avoid cutting towards her R hand, and mod-max vc for positioning paper and lining up scissors to cut around shapes.  Without direct supv for scissor and paper set up, pt tends to cut through her shapes instead of around them.  Continued practice reps of tying knots, with pt requiring mod-max sequencing cues.  Limited carryover with scissor technique and tying technique from last session, despite high reps and direct supv and sequencing cues.  Anticipate ongoing supv needed for both of these tasks d/t limited new carryover or new learning with multi-step tasks.  Anticipate d/c next visit d/t insurance visit limitations.  Communicated this to pt and mother and both verbalized understanding.  Pt. continues to benefit from OT services to work towards meeting unmet goals listed above to improve functional performance with ADLs/IADLs, to maximize independence, and to decrease dependence on caregivers.   PERFORMANCE DEFICITS: in functional skills including ADLs, IADLs, coordination, dexterity, strength, Fine motor control, Gross motor control, decreased knowledge of use of DME, and UE functional use, cognitive skills including , and psychosocial skills including coping strategies, environmental adaptation, and routines and behaviors.   IMPAIRMENTS: are limiting patient from ADLs, IADLs, and leisure.   CO-MORBIDITIES: may have co-morbidities  that affects occupational performance. Patient will benefit from skilled OT to address above impairments and improve overall function.  MODIFICATION OR ASSISTANCE TO COMPLETE EVALUATION: Min-Moderate modification of tasks or assist with assess necessary to complete an evaluation.  OT OCCUPATIONAL PROFILE AND HISTORY: Detailed assessment: Review of records and additional review of physical, cognitive, psychosocial history related to current functional performance.  CLINICAL DECISION MAKING: Moderate - several treatment options,  min-mod task modification necessary  REHAB POTENTIAL: Good  EVALUATION COMPLEXITY: Moderate    PLAN:  OT FREQUENCY: 1x/week  OT DURATION: 12 weeks  PLANNED INTERVENTIONS: 97168 OT Re-evaluation, 97535 self care/ADL training, 02889 therapeutic exercise, 97530 therapeutic activity, 97112 neuromuscular re-education, 97140 manual therapy, 97018 paraffin, 02989 moist heat, 97034 contrast bath, 97760 Orthotics management and training, 02239 Splinting (initial encounter), functional mobility training, visual/perceptual remediation/compensation, patient/family education, and DME and/or AE instructions  RECOMMENDED OTHER SERVICES: PT  CONSULTED AND AGREED WITH PLAN OF CARE: Patient  PLAN FOR NEXT SESSION: Treatment  Inocente Blazing, MS, OTR/L  06/30/2024, 2:06 PM

## 2024-07-07 ENCOUNTER — Ambulatory Visit: Payer: MEDICAID

## 2024-07-21 ENCOUNTER — Ambulatory Visit: Payer: MEDICAID | Attending: Internal Medicine

## 2024-07-21 DIAGNOSIS — G809 Cerebral palsy, unspecified: Secondary | ICD-10-CM | POA: Diagnosis present

## 2024-07-21 DIAGNOSIS — R262 Difficulty in walking, not elsewhere classified: Secondary | ICD-10-CM | POA: Insufficient documentation

## 2024-07-21 DIAGNOSIS — R278 Other lack of coordination: Secondary | ICD-10-CM | POA: Insufficient documentation

## 2024-07-21 DIAGNOSIS — M6281 Muscle weakness (generalized): Secondary | ICD-10-CM | POA: Diagnosis present

## 2024-07-21 NOTE — Therapy (Addendum)
 OUTPATIENT PHYSICAL THERAPY TREATMENT/Recert   Patient Name: Renee Walton MRN: [id number] DOB:07/05/1996, 28 y.o., female Today's Date: 07/21/2024  PCP: Fernand Fredy RAMAN, MD REFERRING PROVIDER:   Fernand Fredy RAMAN, MD    END OF SESSION:   07/21/24 1450  PT Visits / Re-Eval  Visit Number 42  Number of Visits 52  Date for Recertification  10/20/24  Authorization  Authorization Type Medicaid [place] Access: 04/28/24-10/25/24 (4 visits)  Authorization Time Period 07/21/24-10/20/24  Authorization - Visit Number 1  Authorization - Number of Visits 4  Progress Note Due on Visit 50  PT Time Calculation  PT Start Time 1447  PT Stop Time 1527  PT Time Calculation (min) 40 min  PT - End of Session  Equipment Utilized During Treatment Gait belt  Activity Tolerance Patient tolerated treatment well  Behavior During Therapy WFL for tasks assessed/performed      Past Medical History:  Diagnosis Date   Breakthrough bleeding    Cerebral palsy (HCC)    GAD (generalized anxiety disorder)    Migraine headache    Past Surgical History:  Procedure Laterality Date   LEG SURGERY     bilateral x2 in 2008   Patient Active Problem List   Diagnosis Date Noted   Seasonal allergic rhinitis due to pollen 03/27/2023   Bronchitis 03/13/2023   Gastroesophageal reflux disease without esophagitis 02/24/2023   Migraine without aura and without status migrainosus, not intractable 02/24/2023   GAD (generalized anxiety disorder) 02/24/2023   Loss of weight 07/17/2015   Depression 07/17/2015   Cerebral palsy (HCC) 07/17/2015    ONSET DATE: Nov 13, 1993  REFERRING DIAG:   G80.9 (ICD-10-CM) - Cerebral palsy (HCC)   THERAPY DIAG:  Muscle weakness (generalized)  Other lack of coordination  Cerebral palsy, unspecified type (HCC)  Difficulty in walking, not elsewhere classified  Rationale for Evaluation and Treatment: Rehabilitation  SUBJECTIVE:                                                                                                                                                                                              SUBJECTIVE STATEMENT   Pt reports botox abotu 1 month prior. Pt has bene working on various mobility tasks.    PERTINENT HISTORY:   Pt is a pleasant 28 y/o female with CP who is returning to PT after absence and trip to [place]. She would like to improve her mobility, gait and balance. Pt has continued her PT exercises while away and worked on gait, and wants to become independent using a RW. She reports 2 recent falls, and no injuries. Pt also with stiffness in  BLEs. PMH consists of the following: CP, depression, GAD, migraine headache, hx of LE surgery bilateral x2 in 2008.   PAIN:  Are you having pain? No  PRECAUTIONS: Fall  WEIGHT BEARING RESTRICTIONS: No  FALLS: Has patient fallen in last 6 months? Yes. Number of falls 2 when walking. She reports no injury. She got back up independently   PATIENT GOALS: Pt would like to improve her ability to ambulate with RW both inside and over outside surfaces in order to increase her independence with mobility. She would like to learn how to safely get in and out of a car with her RW.  OBJECTIVE:   Today's Treatment: 07/21/24 -overground AMB, no device, minGuardA 750ft  -overground AMB, c weighted RW for resistance (25lb) 3 circles around treadmill/nustep each direction -P/ROM stretching BLE: quads (WNL) Hamstrings (>70 degrees bilat), ankle DF, hip ER (2x45sec bilat)  -AMB with 2lb AW bilat, 25lb RW weight, then up 4 stairs down 4 stairs 3 times wth railings, then AMB back to transpor tchair  05/12/24:  FGA: 5/30 Seated hamstring stretch x 60 seconds each LE  Stairs alt step 6 step x multiple reps each LE- cuing for technique to improve foot clearance  Sit to stand 2 x 10 with no UE support  LTL stepping in at bar with UUE support 6x length of bars Standing marching at bar with BUE support x 10 each   PATIENT  EDUCATION: Education details: Pt educated throughout session about proper posture and technique with exercises. Improved exercise technique, movement at target joints, use of target muscles after min to mod verbal, visual, tactile cues.   Person educated: Patient Education method: Explanation, Demonstration, and Verbal cues Education comprehension: verbalized understanding and returned demonstration  HOME EXERCISE PROGRAM See pt instruction section   GOALS: Goals reviewed with patient? Yes  SHORT TERM GOALS: Target date: 03/10/2024  Patient will be independent in home exercise program to improve strength/mobility for better functional independence with ADLs. Baseline: to be initiated next 1-2 visits; 02/19/2023: pt not consistently performing HEP; 04/23/23:  Pt reports she goes to gym every three days; 8/7: Pt reports she has been increasing her activity levels, but currently waiting for caregiver to start to be able to go to gym; 01/28/2024:  exercises are going well per pt report Goal status: MET  2. The pt will report independence and consistency with gym program outside PT to continue making gains with strength and mobility  Baseline: pt to start going to gym soon 6/25: reports she will start next week  Goal status: In progress   LONG TERM GOALS: Target date: 10/20/24  Patient will increase FOTO score to equal to or greater than  76  to demonstrate statistically significant improvement in mobility and quality of life.  Baseline: risk adjusted 51; 02/19/2023: 63 * pt reporting pain in left leg limiting stair climbing; 04/23/23: 81 Goal status: MET/DISCONTINUED  2.  Pt will reduce 5xsSTS time to < 8 sec to demonstrate clinically significant improvement in LE strength and balance to reduce risk of falls.  Baseline: previously 12.76 sec on 06/10/22, now 10 sec to RW on 10/22/2022; 02/19/2023 8.5 seconds hands-free; 04/23/23: 7 seconds; 10/29/23: 6.5 sec  goal status: MET  3.  Pt will improve 6 MWT to >  1000' with no AD and no LOB to demonstrate ability to improve safety with community distances. Baseline: was 822 ft back in August 2023, now 808 with RW, CGA; 02/19/2023 845 ft with 5TT; 04/23/23: 820  ft 4WW; 05/28/23:  728 ft with RW, limited by LLE pain; 07/30/2023: 878 ft with RW; 10/08/23: 518 pt somewhat distracted throughout; 935 ft with no AD, CGA; 01/28/24 645 ft with RW (used as pt slightly unsteady today)  6/25: 1014 ft no AD  Goal status: MET  4.  Pt will improve 10 m gait speed with LRAD to > 1.0 m/s to demonstrate reduced risk of falls along with safe ability to complete community walking tasks Baseline: previously 0.7 m/s on 06/10/2022, now 0.77 m/s with RW; 02/19/2023: 0.82 m/s with RW; 04/23/23: 0.95 m/s with RW; 05/28/23: 0.83 m/s pain limited LLE; 10/9: 1.08 m/s with RW  Goal status: MET  5.  Patient will increase Berg Balance score by > 6 points to demonstrate decreased fall risk during functional activities.  Baseline: previously 35/56 on 03/11/22(?), test deferred to next appt; 11/13/2022: 36/56; 02/19/2023: 39/56; 04/23/23: 42/56  6/25: 52/56  Goal status: MET/ Progressed   6.   Patient will be independent with ascend/descend 12 steps using single UE  without LOB. Baseline: 6/25: BUE support step to pattern of negotiation for 5 steps  Goal status: NEW  ASSESSMENT:  CLINICAL IMPRESSION:  Goo drepsonse to overeground AMB today. We worked both resisted AMB with device and AMB without device. Pt has few LOB in session, but close minGuard assis provided htoruhgout. The pt will benefit from further skilled PT to improve balance, gait and mobility.  OBJECTIVE IMPAIRMENTS: Abnormal gait, decreased activity tolerance, decreased balance, decreased coordination, decreased endurance, decreased knowledge of use of DME, decreased mobility, difficulty walking, decreased strength, impaired tone, improper body mechanics, postural dysfunction, and pain.   ACTIVITY LIMITATIONS: carrying, lifting, bending,  standing, squatting, stairs, transfers, dressing, and locomotion level  PARTICIPATION LIMITATIONS: meal prep, cleaning, shopping, community activity, and yard work  PERSONAL FACTORS: Fitness, Sex, Time since onset of injury/illness/exacerbation, and 3+ comorbidities: PMH consists of the following: CP, depression, GAD, migraine headache, hx of LE surgery bilateral x2 in 2008 are also affecting patient's functional outcome.   REHAB POTENTIAL: Good  CLINICAL DECISION MAKING: Evolving/moderate complexity  EVALUATION COMPLEXITY: Moderate  PLAN:  PT FREQUENCY: 1-2x/week  PT DURATION: 3 months   PLANNED INTERVENTIONS: Therapeutic exercises, Therapeutic activity, Neuromuscular re-education, Balance training, Gait training, Patient/Family education, Self Care, Joint mobilization, Stair training, Vestibular training, Canalith repositioning, Visual/preceptual remediation/compensation, Orthotic/Fit training, DME instructions, Dry Needling, Electrical stimulation, Wheelchair mobility training, Spinal mobilization, Cryotherapy, Moist heat, Splintting, Taping, Biofeedback, Manual therapy, and Re-evaluation   2:59 PM, 07/21/24 Peggye JAYSON Linear, PT, DPT Physical Therapist - [facility]  Outpatient Physical Therapy- Main [facility] [phone]     addendum on 08/04/24 to run cert omitted at time of treatment.   1:35 PM, 08/04/24 Peggye JAYSON Linear, PT, DPT Physical Therapist -  [facility]  Outpatient Physical Therapy- Main [facility] [phone]

## 2024-08-04 ENCOUNTER — Ambulatory Visit: Payer: MEDICAID

## 2024-08-04 NOTE — Addendum Note (Signed)
 Addended by: EMELIA PEGGYE BROCKS on: 08/04/2024 01:35 PM   Modules accepted: Orders

## 2024-08-18 ENCOUNTER — Ambulatory Visit: Payer: MEDICAID

## 2024-08-24 ENCOUNTER — Other Ambulatory Visit: Payer: Self-pay | Admitting: Internal Medicine

## 2024-08-25 ENCOUNTER — Ambulatory Visit: Payer: MEDICAID | Admitting: Physical Therapy

## 2024-09-02 ENCOUNTER — Other Ambulatory Visit: Payer: Self-pay | Admitting: Internal Medicine

## 2024-09-02 DIAGNOSIS — R110 Nausea: Secondary | ICD-10-CM

## 2024-09-02 MED ORDER — ONDANSETRON HCL 4 MG PO TABS: ORAL_TABLET | ORAL | 3 refills | Status: AC

## 2024-09-02 MED ORDER — GABAPENTIN 100 MG PO CAPS: 100.0000 mg | ORAL_CAPSULE | Freq: Every day | ORAL | 3 refills | Status: AC

## 2024-09-04 ENCOUNTER — Other Ambulatory Visit: Payer: Self-pay | Admitting: Internal Medicine

## 2024-09-08 ENCOUNTER — Ambulatory Visit: Payer: MEDICAID

## 2024-10-11 ENCOUNTER — Ambulatory Visit: Payer: MEDICAID | Admitting: Internal Medicine

## 2024-10-26 ENCOUNTER — Ambulatory Visit: Payer: MEDICAID | Admitting: Internal Medicine

## 2024-11-24 ENCOUNTER — Ambulatory Visit: Payer: MEDICAID

## 2024-12-08 ENCOUNTER — Ambulatory Visit: Payer: MEDICAID

## 2024-12-22 ENCOUNTER — Ambulatory Visit: Payer: MEDICAID

## 2025-01-05 ENCOUNTER — Ambulatory Visit: Payer: MEDICAID

## 2025-01-12 ENCOUNTER — Ambulatory Visit: Payer: MEDICAID

## 2025-01-19 ENCOUNTER — Ambulatory Visit: Payer: MEDICAID

## 2025-01-26 ENCOUNTER — Ambulatory Visit: Payer: MEDICAID

## 2025-02-02 ENCOUNTER — Ambulatory Visit: Payer: MEDICAID

## 2025-02-09 ENCOUNTER — Ambulatory Visit: Payer: MEDICAID

## 2025-02-16 ENCOUNTER — Ambulatory Visit: Payer: MEDICAID

## 2025-02-23 ENCOUNTER — Ambulatory Visit: Payer: MEDICAID

## 2025-03-02 ENCOUNTER — Ambulatory Visit: Payer: MEDICAID

## 2025-03-09 ENCOUNTER — Ambulatory Visit: Payer: MEDICAID

## 2025-03-16 ENCOUNTER — Ambulatory Visit: Payer: MEDICAID

## 2025-03-23 ENCOUNTER — Ambulatory Visit: Payer: MEDICAID

## 2025-03-30 ENCOUNTER — Ambulatory Visit: Payer: MEDICAID

## 2025-04-06 ENCOUNTER — Ambulatory Visit: Payer: MEDICAID

## 2025-04-13 ENCOUNTER — Ambulatory Visit: Payer: MEDICAID
